# Patient Record
Sex: Female | Born: 1935
Health system: Southern US, Community
[De-identification: ages and names within clinical notes are randomized; demographics above are authoritative.]

## PROBLEM LIST (undated history)

## (undated) DIAGNOSIS — R5381 Other malaise: Secondary | ICD-10-CM

## (undated) DIAGNOSIS — K449 Diaphragmatic hernia without obstruction or gangrene: Secondary | ICD-10-CM

## (undated) DIAGNOSIS — S0101XA Laceration without foreign body of scalp, initial encounter: Secondary | ICD-10-CM

## (undated) DIAGNOSIS — D649 Anemia, unspecified: Secondary | ICD-10-CM

## (undated) DIAGNOSIS — H9190 Unspecified hearing loss, unspecified ear: Secondary | ICD-10-CM

## (undated) DIAGNOSIS — Z8709 Personal history of other diseases of the respiratory system: Secondary | ICD-10-CM

## (undated) DIAGNOSIS — I672 Cerebral atherosclerosis: Secondary | ICD-10-CM

## (undated) DIAGNOSIS — I779 Disorder of arteries and arterioles, unspecified: Secondary | ICD-10-CM

## (undated) DIAGNOSIS — G4700 Insomnia, unspecified: Secondary | ICD-10-CM

## (undated) DIAGNOSIS — F419 Anxiety disorder, unspecified: Secondary | ICD-10-CM

## (undated) DIAGNOSIS — M549 Dorsalgia, unspecified: Secondary | ICD-10-CM

## (undated) DIAGNOSIS — Z9189 Other specified personal risk factors, not elsewhere classified: Secondary | ICD-10-CM

## (undated) DIAGNOSIS — E785 Hyperlipidemia, unspecified: Secondary | ICD-10-CM

## (undated) DIAGNOSIS — Z8781 Personal history of (healed) traumatic fracture: Secondary | ICD-10-CM

## (undated) DIAGNOSIS — K130 Diseases of lips: Secondary | ICD-10-CM

## (undated) DIAGNOSIS — H269 Unspecified cataract: Secondary | ICD-10-CM

## (undated) DIAGNOSIS — H3530 Unspecified macular degeneration: Secondary | ICD-10-CM

## (undated) DIAGNOSIS — M810 Age-related osteoporosis without current pathological fracture: Secondary | ICD-10-CM

## (undated) DIAGNOSIS — F329 Major depressive disorder, single episode, unspecified: Secondary | ICD-10-CM

## (undated) DIAGNOSIS — R5383 Other fatigue: Secondary | ICD-10-CM

## (undated) DIAGNOSIS — E079 Disorder of thyroid, unspecified: Secondary | ICD-10-CM

## (undated) DIAGNOSIS — F32A Depression, unspecified: Secondary | ICD-10-CM

## (undated) DIAGNOSIS — M1990 Unspecified osteoarthritis, unspecified site: Secondary | ICD-10-CM

## (undated) DIAGNOSIS — Z8719 Personal history of other diseases of the digestive system: Secondary | ICD-10-CM

## (undated) DIAGNOSIS — K649 Unspecified hemorrhoids: Secondary | ICD-10-CM

## (undated) DIAGNOSIS — K859 Acute pancreatitis without necrosis or infection, unspecified: Secondary | ICD-10-CM

## (undated) DIAGNOSIS — I10 Essential (primary) hypertension: Secondary | ICD-10-CM

## (undated) DIAGNOSIS — S32591A Other specified fracture of right pubis, initial encounter for closed fracture: Secondary | ICD-10-CM

## (undated) DIAGNOSIS — L719 Rosacea, unspecified: Secondary | ICD-10-CM

## (undated) DIAGNOSIS — Z8679 Personal history of other diseases of the circulatory system: Secondary | ICD-10-CM

## (undated) DIAGNOSIS — R0989 Other specified symptoms and signs involving the circulatory and respiratory systems: Secondary | ICD-10-CM

## (undated) DIAGNOSIS — K8020 Calculus of gallbladder without cholecystitis without obstruction: Secondary | ICD-10-CM

## (undated) DIAGNOSIS — G478 Other sleep disorders: Secondary | ICD-10-CM

## (undated) DIAGNOSIS — R630 Anorexia: Secondary | ICD-10-CM

## (undated) DIAGNOSIS — I498 Other specified cardiac arrhythmias: Secondary | ICD-10-CM

## (undated) DIAGNOSIS — K625 Hemorrhage of anus and rectum: Secondary | ICD-10-CM

## (undated) DIAGNOSIS — N189 Chronic kidney disease, unspecified: Secondary | ICD-10-CM

## (undated) DIAGNOSIS — K5730 Diverticulosis of large intestine without perforation or abscess without bleeding: Secondary | ICD-10-CM

## (undated) HISTORY — DX: Laceration without foreign body of scalp, initial encounter: S01.01XA

## (undated) HISTORY — DX: Personal history of other diseases of the digestive system: Z87.19

## (undated) HISTORY — DX: Diseases of lips: K13.0

## (undated) HISTORY — DX: Personal history of other diseases of the circulatory system: Z86.79

## (undated) HISTORY — DX: Other specified fracture of right pubis, initial encounter for closed fracture: S32.591A

## (undated) HISTORY — DX: Age-related osteoporosis without current pathological fracture: M81.0

## (undated) HISTORY — PX: TOTAL HIP ARTHROPLASTY: SHX124

## (undated) HISTORY — DX: Unspecified macular degeneration: H35.30

## (undated) HISTORY — DX: Disorder of thyroid, unspecified: E07.9

## (undated) HISTORY — DX: Personal history of (healed) traumatic fracture: Z87.81

## (undated) HISTORY — DX: Essential (primary) hypertension: I10

## (undated) HISTORY — DX: Unspecified cataract: H26.9

## (undated) HISTORY — DX: Unspecified hearing loss, unspecified ear: H91.90

## (undated) HISTORY — DX: Dorsalgia, unspecified: M54.9

## (undated) HISTORY — DX: Rosacea, unspecified: L71.9

## (undated) HISTORY — DX: Hemorrhage of anus and rectum: K62.5

## (undated) HISTORY — DX: Unspecified osteoarthritis, unspecified site: M19.90

## (undated) HISTORY — DX: Cerebral atherosclerosis: I67.2

## (undated) HISTORY — DX: Acute pancreatitis without necrosis or infection, unspecified: K85.90

## (undated) HISTORY — DX: Other fatigue: R53.83

## (undated) HISTORY — DX: Hyperlipidemia, unspecified: E78.5

## (undated) HISTORY — DX: Unspecified hemorrhoids: K64.9

## (undated) HISTORY — DX: Diaphragmatic hernia without obstruction or gangrene: K44.9

## (undated) HISTORY — DX: Calculus of gallbladder without cholecystitis without obstruction: K80.20

## (undated) HISTORY — DX: Other malaise: R53.81

## (undated) HISTORY — DX: Other sleep disorders: G47.8

## (undated) HISTORY — DX: Chronic kidney disease, unspecified: N18.9

## (undated) HISTORY — DX: Anorexia: R63.0

## (undated) HISTORY — DX: Anemia, unspecified: D64.9

## (undated) HISTORY — DX: Disorder of arteries and arterioles, unspecified: I77.9

## (undated) HISTORY — DX: Other specified symptoms and signs involving the circulatory and respiratory systems: R09.89

## (undated) HISTORY — DX: Other specified cardiac arrhythmias: I49.8

## (undated) HISTORY — DX: Insomnia, unspecified: G47.00

## (undated) HISTORY — DX: Depression, unspecified: F32.A

## (undated) HISTORY — DX: Other specified personal risk factors, not elsewhere classified: Z91.89

## (undated) HISTORY — DX: Major depressive disorder, single episode, unspecified: F32.9

## (undated) HISTORY — DX: Anxiety disorder, unspecified: F41.9

## (undated) HISTORY — DX: Diverticulosis of large intestine without perforation or abscess without bleeding: K57.30

## (undated) HISTORY — DX: Personal history of other diseases of the respiratory system: Z87.09

## (undated) HISTORY — PX: ABDOMINAL HYSTERECTOMY: SHX81

---

## 1994-02-22 HISTORY — PX: JOINT REPLACEMENT: SHX530

## 2000-04-07 ENCOUNTER — Encounter: Payer: Self-pay | Admitting: Internal Medicine

## 2000-04-07 ENCOUNTER — Encounter: Admission: RE | Admit: 2000-04-07 | Discharge: 2000-04-07 | Payer: Self-pay | Admitting: Internal Medicine

## 2000-05-12 ENCOUNTER — Ambulatory Visit (HOSPITAL_COMMUNITY): Admission: RE | Admit: 2000-05-12 | Discharge: 2000-05-12 | Payer: Self-pay | Admitting: Pulmonary Disease

## 2000-05-12 ENCOUNTER — Encounter: Payer: Self-pay | Admitting: Pulmonary Disease

## 2001-05-10 ENCOUNTER — Ambulatory Visit (HOSPITAL_COMMUNITY): Admission: RE | Admit: 2001-05-10 | Discharge: 2001-05-10 | Payer: Self-pay | Admitting: Specialist

## 2001-09-25 ENCOUNTER — Ambulatory Visit (HOSPITAL_COMMUNITY): Admission: RE | Admit: 2001-09-25 | Discharge: 2001-09-25 | Payer: Self-pay | Admitting: Specialist

## 2002-01-11 ENCOUNTER — Ambulatory Visit (HOSPITAL_COMMUNITY): Admission: RE | Admit: 2002-01-11 | Discharge: 2002-01-11 | Payer: Self-pay | Admitting: *Deleted

## 2002-01-11 ENCOUNTER — Encounter (INDEPENDENT_AMBULATORY_CARE_PROVIDER_SITE_OTHER): Payer: Self-pay | Admitting: *Deleted

## 2003-12-14 ENCOUNTER — Emergency Department (HOSPITAL_COMMUNITY): Admission: EM | Admit: 2003-12-14 | Discharge: 2003-12-14 | Payer: Self-pay | Admitting: Emergency Medicine

## 2005-06-14 ENCOUNTER — Ambulatory Visit (HOSPITAL_COMMUNITY): Admission: RE | Admit: 2005-06-14 | Discharge: 2005-06-14 | Payer: Self-pay | Admitting: *Deleted

## 2005-09-04 ENCOUNTER — Emergency Department (HOSPITAL_COMMUNITY): Admission: EM | Admit: 2005-09-04 | Discharge: 2005-09-04 | Payer: Self-pay | Admitting: Emergency Medicine

## 2005-12-16 ENCOUNTER — Emergency Department (HOSPITAL_COMMUNITY): Admission: EM | Admit: 2005-12-16 | Discharge: 2005-12-16 | Payer: Self-pay | Admitting: Emergency Medicine

## 2006-03-07 ENCOUNTER — Emergency Department (HOSPITAL_COMMUNITY): Admission: EM | Admit: 2006-03-07 | Discharge: 2006-03-07 | Payer: Self-pay | Admitting: Emergency Medicine

## 2007-05-23 ENCOUNTER — Ambulatory Visit (HOSPITAL_COMMUNITY): Admission: RE | Admit: 2007-05-23 | Discharge: 2007-05-23 | Payer: Self-pay | Admitting: *Deleted

## 2007-05-23 HISTORY — PX: COLONOSCOPY: SHX174

## 2007-09-19 ENCOUNTER — Emergency Department (HOSPITAL_COMMUNITY): Admission: EM | Admit: 2007-09-19 | Discharge: 2007-09-19 | Payer: Self-pay | Admitting: Emergency Medicine

## 2008-07-24 ENCOUNTER — Encounter: Admission: RE | Admit: 2008-07-24 | Discharge: 2008-07-24 | Payer: Self-pay | Admitting: Internal Medicine

## 2009-02-19 ENCOUNTER — Inpatient Hospital Stay (HOSPITAL_COMMUNITY): Admission: EM | Admit: 2009-02-19 | Discharge: 2009-02-25 | Payer: Self-pay | Admitting: Emergency Medicine

## 2009-02-20 ENCOUNTER — Encounter (INDEPENDENT_AMBULATORY_CARE_PROVIDER_SITE_OTHER): Payer: Self-pay | Admitting: Internal Medicine

## 2009-02-21 ENCOUNTER — Ambulatory Visit: Payer: Self-pay | Admitting: Vascular Surgery

## 2009-08-25 ENCOUNTER — Emergency Department (HOSPITAL_COMMUNITY): Admission: EM | Admit: 2009-08-25 | Discharge: 2009-08-25 | Payer: Self-pay | Admitting: Emergency Medicine

## 2009-09-15 ENCOUNTER — Ambulatory Visit (HOSPITAL_COMMUNITY): Admission: RE | Admit: 2009-09-15 | Discharge: 2009-09-15 | Payer: Self-pay | Admitting: Orthopedic Surgery

## 2010-05-09 LAB — BASIC METABOLIC PANEL
CO2: 22 mEq/L (ref 19–32)
CO2: 23 mEq/L (ref 19–32)
Calcium: 7.9 mg/dL — ABNORMAL LOW (ref 8.4–10.5)
Chloride: 107 mEq/L (ref 96–112)
Creatinine, Ser: 1.18 mg/dL (ref 0.4–1.2)
GFR calc Af Amer: 52 mL/min — ABNORMAL LOW (ref >60)
GFR calc non Af Amer: 43 mL/min — ABNORMAL LOW (ref >60)
GFR calc non Af Amer: 51 mL/min — ABNORMAL LOW (ref >60)
Sodium: 130 mEq/L — ABNORMAL LOW (ref 135–145)
Sodium: 139 mEq/L (ref 135–145)

## 2010-05-09 LAB — CBC
HCT: 25.2 % — ABNORMAL LOW (ref 36.0–46.0)
HCT: 31.1 % — ABNORMAL LOW (ref 36.0–46.0)
HCT: 35.7 % — ABNORMAL LOW (ref 36.0–46.0)
Hemoglobin: 10.7 g/dL — ABNORMAL LOW (ref 12.0–15.0)
Hemoglobin: 12 g/dL (ref 12.0–15.0)
Hemoglobin: 8.5 g/dL — ABNORMAL LOW (ref 12.0–15.0)
MCV: 91.7 fL (ref 78.0–100.0)
Platelets: 118 10*3/uL — ABNORMAL LOW (ref 150–400)
Platelets: 165 10*3/uL (ref 150–400)
RBC: 3.87 MIL/uL (ref 3.87–5.11)
RDW: 14.8 % (ref 11.5–15.5)
WBC: 7.6 10*3/uL (ref 4.0–10.5)

## 2010-05-09 LAB — PREPARE RBC (CROSSMATCH)

## 2010-05-25 LAB — CBC
Hemoglobin: 10.2 g/dL — ABNORMAL LOW (ref 12.0–15.0)
Hemoglobin: 11.1 g/dL — ABNORMAL LOW (ref 12.0–15.0)
Hemoglobin: 11.5 g/dL — ABNORMAL LOW (ref 12.0–15.0)
MCHC: 34.2 g/dL (ref 30.0–36.0)
Platelets: 140 10*3/uL — ABNORMAL LOW (ref 150–400)
RBC: 3.48 MIL/uL — ABNORMAL LOW (ref 3.87–5.11)
RDW: 13.1 % (ref 11.5–15.5)
RDW: 13.8 % (ref 11.5–15.5)
WBC: 8.6 10*3/uL (ref 4.0–10.5)

## 2010-05-25 LAB — POCT I-STAT, CHEM 8
BUN: 26 mg/dL — ABNORMAL HIGH (ref 6–23)
HCT: 35 % — ABNORMAL LOW (ref 36.0–46.0)
Hemoglobin: 11.9 g/dL — ABNORMAL LOW (ref 12.0–15.0)
Sodium: 140 mEq/L (ref 135–145)
TCO2: 21 mmol/L (ref 0–100)

## 2010-05-25 LAB — DIFFERENTIAL
Basophils Absolute: 0 10*3/uL (ref 0.0–0.1)
Lymphocytes Relative: 10 % — ABNORMAL LOW (ref 12–46)
Lymphocytes Relative: 4 % — ABNORMAL LOW (ref 12–46)
Monocytes Absolute: 0.5 10*3/uL (ref 0.1–1.0)
Monocytes Absolute: 0.7 10*3/uL (ref 0.1–1.0)
Monocytes Relative: 5 % (ref 3–12)
Neutro Abs: 7.3 10*3/uL (ref 1.7–7.7)
Neutro Abs: 7.8 10*3/uL — ABNORMAL HIGH (ref 1.7–7.7)

## 2010-05-25 LAB — APTT: aPTT: 27 seconds (ref 24–37)

## 2010-05-25 LAB — BASIC METABOLIC PANEL
Calcium: 7.9 mg/dL — ABNORMAL LOW (ref 8.4–10.5)
Calcium: 8.7 mg/dL (ref 8.4–10.5)
Creatinine, Ser: 1.23 mg/dL — ABNORMAL HIGH (ref 0.4–1.2)
GFR calc Af Amer: >60 mL/min (ref >60)
GFR calc non Af Amer: 41 mL/min — ABNORMAL LOW (ref >60)
GFR calc non Af Amer: 59 mL/min — ABNORMAL LOW (ref >60)
Glucose, Bld: 111 mg/dL — ABNORMAL HIGH (ref 70–99)
Sodium: 140 mEq/L (ref 135–145)
Sodium: 140 mEq/L (ref 135–145)

## 2010-05-25 LAB — TYPE AND SCREEN
ABO/RH(D): O POS
Antibody Screen: NEGATIVE

## 2010-05-25 LAB — PROTIME-INR: Prothrombin Time: 13.2 seconds (ref 11.6–15.2)

## 2010-05-25 LAB — PHOSPHORUS: Phosphorus: 3.6 mg/dL (ref 2.3–4.6)

## 2010-07-07 NOTE — Op Note (Signed)
NAME:  Patricia Nelson, SCHWENN NO.:  [id number]   MEDICAL RECORD NO.:  [id number]          PATIENT TYPE:  AMB   LOCATION:  ENDO                         FACILITY:  [facility]   PHYSICIAN:  Georgiana Spinner, M.D.    DATE OF BIRTH:  10/28/1915   DATE OF PROCEDURE:  DATE OF DISCHARGE:                               OPERATIVE REPORT   PROCEDURE:  Flexible sigmoidoscopy.   INDICATIONS:  Rectal bleeding.   ANESTHESIA:  None given.   With the patient in the left lateral decubitus position,  the Pentax  videoscopic pediatric colonoscope was inserted in the rectum and passed  under direct vision to approximately 25 cm from the anal verge at which  point we encountered stool that precluded passage any further. The  endoscope was then withdrawn taking circumferential views of the colonic  mucosa stopping in the rectum which appeared normal on direct and showed  large internal hemorrhoids on retroflexed view. The endoscope was  straightened and withdrawn.  The patient's vital signs and pulse  oximeter remained stable. The patient tolerated the procedure well  without apparent complications.   FINDINGS:  Internal hemorrhoids and one was noted to be prolapsing when  we did the rectal examination.  Otherwise an unremarkable examination of  the perineum and rectum to 25 cm.   PLAN:  Have patient follow-up with me as needed.           ______________________________  Georgiana Spinner, M.D.     GMO/MEDQ  D:  05/23/2007  T:  05/23/2007  Job:  [id number]

## 2010-07-10 NOTE — Op Note (Signed)
   NAME:  Patricia Nelson, Patricia Nelson                      ACCOUNT NO.:  [id number]   MEDICAL RECORD NO.:  [id number]                   PATIENT TYPE:  AMB   LOCATION:  ENDO                                 FACILITY:  [facility]   PHYSICIAN:  Georgiana Spinner, M.D.                 DATE OF BIRTH:  1915-04-11   DATE OF PROCEDURE:  DATE OF DISCHARGE:                                 OPERATIVE REPORT   PROCEDURE:  Colonoscopy.   INDICATIONS FOR PROCEDURE:  1. Colon polyp.  2. Colon cancer screening.   ANESTHESIA:  1. Demerol 40 mg.  2. Versed 4 mg.   PROCEDURE:  With patient mildly sedated in the left lateral decubitus  position and subsequently rolled to her back, the Olympus videoscopic  colonoscope was inserted in the rectum and passed under direct vision to the  cecum identified by ileocecal valve and appendiceal orifice both of which  are photographed.  From this point the colonoscope was slowly withdrawn  taking ________________ entire colonic mucosa.  She had thickening of the  sigmoid colon mucosa with diverticulosis noted in this area and this is all  we noted until we pulled back to the rectum.  At approximately 10 cm from  the anal verge a fairly large villous-appearing polyp was seen, it was semi-  attached by a stalk and it was photographed and removed using snare cautery  technique sending a 20/20 blended current with good hemostasis.  Tissue was  retrieved.  The endoscope was reinserted to this point and slowly withdrawn  taking ___________ views of the remaining colonic mucosa and until we  reached the anal verge and placed the endoscope in retroflexion viewing the  anal canal from above.  The endoscope was straightened and withdrawn.  The  patient's vital signs and pulse oximetry remained stable.  The patient  tolerated the procedure well without apparent complications.   FINDINGS:  1. Diverticulosis in sigmoid colon.  2. Large internal hemorrhoids.  3. Polyp at approximately 10 cm  from the anal verge removed.   PLAN:  1. Biopsy report.  2. Patient will call in for results and follow up with me as an outpatient.                                               Georgiana Spinner, M.D.    GMO/MEDQ  D:  01/11/2002  T:  01/11/2002  Job:  [id number]

## 2010-07-10 NOTE — Op Note (Signed)
NAME:  Patricia Nelson, Patricia Nelson NO.:  [id number]   MEDICAL RECORD NO.:  [id number]          PATIENT TYPE:  AMB   LOCATION:  ENDO                         FACILITY:  [facility]   PHYSICIAN:  Georgiana Spinner, M.D.    DATE OF BIRTH:  [birth year]   DATE OF PROCEDURE:  06/14/2005  DATE OF DISCHARGE:                                 OPERATIVE REPORT   PROCEDURE:  Upper endoscopy.   INDICATIONS:  Vomiting blood.   ANESTHESIA:  Demerol 20, Versed 4 mg.   PROCEDURE:  With the patient mildly sedated in the left lateral decubitus  position, the Olympus videoscopic endoscope was inserted in the mouth and  passed under direct vision through the esophagus which appeared normal into  the stomach. Fundus, body, antrum, duodenal bulb, second portion duodenum  appeared normal. From this point, the endoscope was slowly withdrawn, taking  circumferential views of duodenal mucosa until the endoscope had been pulled  back into the stomach and placed in retroflexion viewing the stomach from  below. The endoscope was straightened and withdrawn taking circumferential  views remaining gastric and esophageal mucosa. The patient's vital signs and  pulse oximeter remained stable. The patient tolerated procedure well without  apparent complications.   FINDINGS:  Unremarkable examination.   PLAN:  Have the patient follow-up with me on as-needed basis.           ______________________________  Georgiana Spinner, M.D.     GMO/MEDQ  D:  06/14/2005  T:  06/15/2005  Job:  [id number]

## 2011-09-02 ENCOUNTER — Other Ambulatory Visit: Payer: Self-pay | Admitting: Internal Medicine

## 2011-09-02 DIAGNOSIS — R1310 Dysphagia, unspecified: Secondary | ICD-10-CM

## 2011-09-14 ENCOUNTER — Other Ambulatory Visit: Payer: Self-pay

## 2011-09-17 ENCOUNTER — Other Ambulatory Visit: Payer: Self-pay | Admitting: Gastroenterology

## 2011-09-17 DIAGNOSIS — K859 Acute pancreatitis without necrosis or infection, unspecified: Secondary | ICD-10-CM

## 2011-09-22 ENCOUNTER — Ambulatory Visit
Admission: RE | Admit: 2011-09-22 | Discharge: 2011-09-22 | Disposition: A | Discharge status: Home / Self Care | Payer: Medicare Other | Source: Ambulatory Visit | Attending: Gastroenterology | Admitting: Gastroenterology

## 2011-09-22 ENCOUNTER — Ambulatory Visit
Admission: RE | Admit: 2011-09-22 | Discharge: 2011-09-22 | Disposition: A | Discharge status: Home / Self Care | Payer: Medicare Other | Source: Ambulatory Visit | Attending: Internal Medicine | Admitting: Internal Medicine

## 2011-09-22 DIAGNOSIS — K859 Acute pancreatitis without necrosis or infection, unspecified: Secondary | ICD-10-CM

## 2011-09-22 DIAGNOSIS — R1310 Dysphagia, unspecified: Secondary | ICD-10-CM

## 2012-02-05 ENCOUNTER — Ambulatory Visit (INDEPENDENT_AMBULATORY_CARE_PROVIDER_SITE_OTHER): Payer: Medicare Other | Admitting: Family Medicine

## 2012-02-05 VITALS — BP 164/67 | HR 71 | Temp 97.5°F | Resp 16 | Ht 63.25 in | Wt 125.2 lb

## 2012-02-05 DIAGNOSIS — R4182 Altered mental status, unspecified: Secondary | ICD-10-CM

## 2012-02-05 DIAGNOSIS — R8281 Pyuria: Secondary | ICD-10-CM

## 2012-02-05 DIAGNOSIS — I10 Essential (primary) hypertension: Secondary | ICD-10-CM

## 2012-02-05 DIAGNOSIS — R443 Hallucinations, unspecified: Secondary | ICD-10-CM

## 2012-02-05 DIAGNOSIS — G4700 Insomnia, unspecified: Secondary | ICD-10-CM

## 2012-02-05 LAB — POCT CBC
Lymph, poc: 1.7 (ref 0.6–3.4)
MCH, POC: 28.8 pg (ref 27–31.2)
MCHC: 30 g/dL — AB (ref 31.8–35.4)
MCV: 95.9 fL (ref 80–97)
MID (cbc): 0.5 (ref 0–0.9)
MPV: 9.4 fL (ref 0–99.8)
POC LYMPH PERCENT: 25.8 %L (ref 10–50)
POC MID %: 7.3 %M (ref 0–12)
Platelet Count, POC: 253 10*3/uL (ref 142–424)
RBC: 4.1 M/uL (ref 4.04–5.48)
WBC: 6.7 10*3/uL (ref 4.6–10.2)

## 2012-02-05 LAB — VITAMIN B12: Vitamin B-12: 578 pg/mL (ref 211–911)

## 2012-02-05 LAB — POCT URINALYSIS DIPSTICK
Bilirubin, UA: NEGATIVE
Glucose, UA: NEGATIVE
Ketones, UA: NEGATIVE
Spec Grav, UA: 1.015

## 2012-02-05 LAB — POCT UA - MICROSCOPIC ONLY: Crystals, Ur, HPF, POC: NEGATIVE

## 2012-02-05 LAB — BASIC METABOLIC PANEL
BUN: 43 mg/dL — ABNORMAL HIGH (ref 6–23)
Creat: 1.23 mg/dL — ABNORMAL HIGH (ref 0.50–1.10)
Glucose, Bld: 96 mg/dL (ref 70–99)
Potassium: 5.3 mEq/L (ref 3.5–5.3)

## 2012-02-05 MED ORDER — CIPROFLOXACIN HCL 250 MG PO TABS: 250.0000 mg | ORAL_TABLET | Freq: Two times a day (BID) | ORAL | Status: DC

## 2012-02-05 NOTE — Progress Notes (Signed)
Subjective:    Patient ID: Patricia Nelson, female    DOB: Jan 28, 1916, 78 y.o.   MRN: [id number]  HPI Patricia Nelson is a 78 y.o. female usual patient of Dr. Renne Crigler.   More paranoid since yesterday - called on call doctor last night - instructed to go to ER or Urgent care.,  Per patient - just didn't feel the same.   Doesn't sleep well at times - then not herself without sleep - started on Remeron 1/2 tablet at night as of about a month and a half ago - too sedated all the time - better with 1/4 dose - now up to 1/2 pill. Has forgotten doses of this at times.  More forgetful recently, tells family that someone is coming into apartment to move things, and feels like son in law was doing this,  More hyper last night.  Looking for something missing last night.  She didn't want to tell me today what she was looking for.   Has cut back on taking Remeron. Didn't sleep much last night. Did not take Remeron last night, unsure if took night before. remeron not in pill box at night.   Low back sore this morning - felt twisted when woke up. Has been sore in this area in past, usually twists to move or change of position.  Feels better now. Hx hip surgery, has had worse pain in area this am - was able to change position to improve.   [facility].   Here with daughter today. Still upset about not living at home.   Past Medical History  Diagnosis Date  . Arthritis   . Depression   . Cataract   . Thyroid disease   . Anxiety   . Osteoporosis   . Hypertension    Past Surgical History  Procedure Date  . Abdominal hysterectomy   . Joint replacement    History   Social History  . Marital Status: Widowed    Spouse Name: N/A    Number of Children: N/A  . Years of Education: N/A   Occupational History  . Not on file.   Social History Main Topics  . Smoking status: Never Smoker   . Smokeless tobacco: Not on file  . Alcohol Use: No  . Drug Use: No  . Sexually  Active: No   Other Topics Concern  . Not on file   Social History Narrative  . No narrative on file    Review of Systems  Constitutional: Negative for fever and chills.  Respiratory: Negative for chest tightness and shortness of breath.   Cardiovascular: Negative for chest pain.  Gastrointestinal: Negative for abdominal pain.  Genitourinary: Negative for dysuria, urgency, hematuria and difficulty urinating.  Musculoskeletal: Positive for back pain.      Objective:   Physical Exam  Vitals reviewed. Constitutional: She is oriented to person, place, and time. She appears well-developed and well-nourished.  HENT:  Head: Normocephalic and atraumatic.  Right Ear: External ear normal.  Left Ear: External ear normal.  Mouth/Throat: Oropharynx is clear and moist.  Eyes: EOM are normal. Pupils are equal, round, and reactive to light.  Neck: Normal range of motion. No thyromegaly present.  Cardiovascular: Normal rate, regular rhythm, normal heart sounds and intact distal pulses.   Pulmonary/Chest: Effort normal and breath sounds normal.  Abdominal: Soft. Bowel sounds are normal. She exhibits no distension. There is no hepatosplenomegaly. There is no tenderness. There is no rebound and  no CVA tenderness.  Musculoskeletal:       Left hip: She exhibits no tenderness and no bony tenderness (no pain with IR/ER of hip.).  Lymphadenopathy:    She has no cervical adenopathy.  Neurological: She is alert and oriented to person, place, and time. She has normal strength. No sensory deficit. Coordination normal.       Oriented to person place, time, month, not date.  No focal weakness appreciated.   Skin: Skin is warm. No rash noted.     Psychiatric: Her speech is normal and behavior is normal.       Appears irritated/upset in speech only at times, but good eye contact and appropriate in responses otherwise.    Results for orders placed in visit on 02/05/12  POCT UA - MICROSCOPIC ONLY       Component Value Range   WBC, Ur, HPF, POC 3-4     RBC, urine, microscopic 0-1     Bacteria, U Microscopic trace     Mucus, UA pos     Epithelial cells, urine per micros neg     Crystals, Ur, HPF, POC neg     Casts, Ur, LPF, POC neg     Yeast, UA neg    POCT URINALYSIS DIPSTICK      Component Value Range   Color, UA yellow     Clarity, UA clear     Glucose, UA neg     Bilirubin, UA neg     Ketones, UA neg     Spec Grav, UA 1.015     Blood, UA neg     pH, UA 6.5     Protein, UA neg     Urobilinogen, UA 0.2     Nitrite, UA neg     Leukocytes, UA Trace    POCT CBC      Component Value Range   WBC 6.7  4.6 - 10.2 K/uL   Lymph, poc 1.7  0.6 - 3.4   POC LYMPH PERCENT 25.8  10 - 50 %L   MID (cbc) 0.5  0 - 0.9   POC MID % 7.3  0 - 12 %M   POC Granulocyte 4.5  2 - 6.9   Granulocyte percent 66.9  37 - 80 %G   RBC 4.10  4.04 - 5.48 M/uL   Hemoglobin 11.8 (*) 12.2 - 16.2 g/dL   HCT, POC 08.6  57.8 - 47.9 %   MCV 95.9  80 - 97 fL   MCH, POC 28.8  27 - 31.2 pg   MCHC 30.0 (*) 31.8 - 35.4 g/dL   RDW, POC 46.9     Platelet Count, POC 253  142 - 424 K/uL   MPV 9.4  0 - 99.8 fL       Assessment & Plan:  Patricia Nelson is a 78 y.o. female 1. Hallucination  POCT UA - Microscopic Only, POCT urinalysis dipstick, POCT CBC, TSH, Basic metabolic panel, Vitamin B12  2. Mental status change  POCT UA - Microscopic Only, POCT urinalysis dipstick  3. Insomnia  POCT CBC, TSH, Vitamin B12  4. Hypertension  POCT CBC, TSH, Basic metabolic panel, Vitamin B12   Suspect component of insomnia with increased agitation, paranoia with sleep deprivation. Also likely depression component on history today with some decreased interest in activities and continued resentment for decision to go into [facility] 2 1/2 years ago.  Possible early UTI with pyuria, vs asymptomatic bacteruria.  PCN allergic.  Will start  cipro 250mg  BID x 3 days.  SED and cautioned on mental status change with his med. Will check  urine culture, start   Will check labs above, restart remeron 1/4 tab at bedtime, and follow up with Dr. Renne Crigler in next 3-4 days, sooner or rtc sooner if any new or worsening symptoms. Discussed with daughter in room and patient.   LBP - likely hx DDD with hx in past - follow up with primary MD, sooner if worse.   HTN - elevated today.  Check outside BP's - if remaining over 140/90 - RTC.  Continue Norvasc.    Patient Instructions  Your should receive a call or letter about your lab results within the next week to 10 days.  You possibly have an early bladder infection. Start the new antibiotic, we will check a urine culture to be sure.   Restart the remeron - 1/4 pill at bedtime and follow up with your primary provider on Monday.  You can also follow up on the back pains with your primary care provider.  Return to the clinic or go to the nearest emergency room if any of your symptoms worsen or new symptoms occur.

## 2012-02-05 NOTE — Patient Instructions (Signed)
Your should receive a call or letter about your lab results within the next week to 10 days.  You possibly have an early bladder infection. Start the new antibiotic, we will check a urine culture to be sure.   Restart the remeron - 1/4 pill at bedtime and follow up with your primary provider on Monday.  You can also follow up on the back pains with your primary care provider.  Return to the clinic or go to the nearest emergency room if any of your symptoms worsen or new symptoms occur.

## 2012-02-06 LAB — URINE CULTURE: Colony Count: NO GROWTH

## 2012-10-12 ENCOUNTER — Encounter: Payer: Self-pay | Admitting: *Deleted

## 2012-10-18 ENCOUNTER — Encounter: Payer: Self-pay | Admitting: Cardiovascular Disease

## 2012-10-18 ENCOUNTER — Ambulatory Visit (INDEPENDENT_AMBULATORY_CARE_PROVIDER_SITE_OTHER): Payer: Medicare Other | Admitting: Cardiovascular Disease

## 2012-10-18 VITALS — BP 114/58 | HR 65 | Ht 62.5 in | Wt 118.8 lb

## 2012-10-18 DIAGNOSIS — I10 Essential (primary) hypertension: Secondary | ICD-10-CM

## 2012-10-18 DIAGNOSIS — R55 Syncope and collapse: Secondary | ICD-10-CM | POA: Insufficient documentation

## 2012-10-18 DIAGNOSIS — I6523 Occlusion and stenosis of bilateral carotid arteries: Secondary | ICD-10-CM

## 2012-10-18 DIAGNOSIS — I658 Occlusion and stenosis of other precerebral arteries: Secondary | ICD-10-CM

## 2012-10-18 DIAGNOSIS — Z8719 Personal history of other diseases of the digestive system: Secondary | ICD-10-CM

## 2012-10-18 DIAGNOSIS — I6529 Occlusion and stenosis of unspecified carotid artery: Secondary | ICD-10-CM

## 2012-10-18 DIAGNOSIS — E7800 Pure hypercholesterolemia, unspecified: Secondary | ICD-10-CM

## 2012-10-18 NOTE — Progress Notes (Signed)
Patient ID: Patricia Nelson, female   DOB: [birth year], 79 y.o.   MRN: [id number]     Reason for office visit Syncope, hypertension, carotid artery disease followup  Patricia Nelson has recently turned 79 years old. She continues to live in assisted living and is quite active. She walks on a almost daily basis. She does complain of dizziness when she stands up suddenly. She has not had any further syncopal events. Dr. Renne Crigler has noticed that her blood pressure tends to be rather low and has recommended more frequent blood pressure checks. She has not had any focal neurological complaints. Denies edema. Chest not had any chest pain or shortness of breath or palpitations.    Allergies  Allergen Reactions  . Penicillins Rash    Current Outpatient Prescriptions  Medication Sig Dispense Refill  . amLODipine (NORVASC) 10 MG tablet Take 10 mg by mouth daily.      Marland Kitchen aspirin 81 MG tablet Take 81 mg by mouth daily.      . cholecalciferol (VITAMIN D) 1000 UNITS tablet Take 1,000 Units by mouth daily.      Marland Kitchen levothyroxine (SYNTHROID, LEVOTHROID) 25 MCG tablet Take 25 mcg by mouth daily.      . rosuvastatin (CRESTOR) 5 MG tablet Take 5 mg by mouth daily.      . sertraline (ZOLOFT) 50 MG tablet Take 50 mg by mouth daily.      . valsartan-hydrochlorothiazide (DIOVAN-HCT) 320-25 MG per tablet Take 320 tablets by mouth.       No current facility-administered medications for this visit.    Past Medical History  Diagnosis Date  . Arthritis   . Depression   . Cataract   . Thyroid disease   . Anxiety   . Osteoporosis   . Hypertension   . Atherosclerotic cerebrovascular disease   . Hyperlipidemia   . Pancreatitis     Past Surgical History  Procedure Laterality Date  . Abdominal hysterectomy    . Joint replacement      Family History  Problem Relation Age of Onset  . Heart disease Mother   . Diabetes Mother   . Heart disease Father   . Diabetes Sister   . Diabetes Brother   . Diabetes  Daughter     History   Social History  . Marital Status: Widowed    Spouse Name: N/A    Number of Children: N/A  . Years of Education: N/A   Occupational History  . Not on file.   Social History Main Topics  . Smoking status: Never Smoker   . Smokeless tobacco: Not on file  . Alcohol Use: No  . Drug Use: No  . Sexual Activity: No   Other Topics Concern  . Not on file   Social History Narrative  . No narrative on file    Review of systems: The patient specifically denies any chest pain at rest or with exertion, dyspnea at rest or with exertion, orthopnea, paroxysmal nocturnal dyspnea, syncope, palpitations, focal neurological deficits, intermittent claudication, lower extremity edema, unexplained weight gain, cough, hemoptysis or wheezing.  The patient also denies abdominal pain, nausea, vomiting, dysphagia, diarrhea, constipation, polyuria, polydipsia, dysuria, hematuria, frequency, urgency, abnormal bleeding or bruising, fever, chills, unexpected weight changes, mood swings, change in skin or hair texture, change in voice quality, auditory or visual problems, allergic reactions or rashes, new musculoskeletal complaints other than usual "aches and pains".   PHYSICAL EXAM BP 114/58  Pulse 65  Ht 5' 2.5" (1.588 m)  Wt 118 lb 12.8 oz (53.887 kg)  BMI 21.37 kg/m2  General: Alert, oriented x3, no distress Head: no evidence of trauma, PERRL, EOMI, no exophtalmos or lid lag, no myxedema, no xanthelasma; normal ears, nose and oropharynx Neck: normal jugular venous pulsations and no hepatojugular reflux; brisk carotid pulses without delay , but there are bilateral carotid bruits Chest: clear to auscultation, no signs of consolidation by percussion or palpation, normal fremitus, symmetrical and full respiratory excursions Cardiovascular: normal position and quality of the apical impulse, regular rhythm, normal first and second heart sounds, no murmurs, rubs or gallops Abdomen: no  tenderness or distention, no masses by palpation, no abnormal pulsatility or arterial bruits, normal bowel sounds, no hepatosplenomegaly Extremities: no clubbing, cyanosis or edema; 2+ radial, ulnar and brachial pulses bilaterally; 2+ right femoral, posterior tibial and dorsalis pedis pulses; 2+ left femoral, posterior tibial and dorsalis pedis pulses; no subclavian or femoral bruits Neurological: grossly nonfocal   EKG: Sinus rhythm with a couple of PACs, otherwise normal  BMET    Component Value Date/Time   NA 140 02/05/2012 1216   K 5.3 02/05/2012 1216   CL 108 02/05/2012 1216   CO2 23 02/05/2012 1216   GLUCOSE 96 02/05/2012 1216   BUN 43* 02/05/2012 1216   CREATININE 1.23* 02/05/2012 1216   CREATININE 1.02 02/25/2009 0530   CALCIUM 9.1 02/05/2012 1216   GFRNONAA 51* 02/25/2009 0530   GFRAA  Value: >60        The eGFR has been calculated using the MDRD equation. This calculation has not been validated in all clinical situations. eGFR's persistently <60 mL/min signify possible Chronic Kidney Disease. 02/25/2009 0530     ASSESSMENT AND PLAN HTN (hypertension) Her blood pressure control is excellent today, maybe a little too good for this 79 year old lady. She describes symptoms that are strongly compatible with orthostatic hypotension. It seems that Dr. Renne Crigler has already recommended that her blood pressure be checked with increased frequency and is concerned as well about her blood pressure being too low. I would be in perfect agreement with him if he should choose to reduce the dose of her antihypertensive medications. I would probably reduce the dose of diuretic in her combination Diovan HCT. Note that sertraline may also cause some degree of orthostatic hypotension. Meanwhile I advised Patricia Nelson to avoid jumping up out of bed too quickly, in order to avoid orthostatic hypotension. It is possible that her previous episodes of syncope may have been related to orthostatic hypotension as  well.  Hypercholesteremia On statin therapy - will get her most recent labs.  Syncope Possible orthostatic hypotension.  Because of the abrupt nature of her syncopal events, carotid sinus hypersensitivity is an important differential diagnosis. No recent events. Consider upright tilt table test with carotid sinus compression versus a loop recorder if her symptoms of syncope return.  Carotid atherosclerosis Carotid duplex ultrasonography in 2012 showed bilateral less than 50% carotid stenoses as well as a 50-69% stenosis in the left subclavian artery with antegrade vertebral flow. Serial studies are not recommended in my opinion, unless she should develop neurological symptoms.  H/O acute pancreatitis Etiology uncertain  Orders Placed This Encounter  Procedures  . EKG 12-Lead   Meds ordered this encounter  Medications  . sertraline (ZOLOFT) 50 MG tablet    Sig: Take 50 mg by mouth daily.  . valsartan-hydrochlorothiazide (DIOVAN-HCT) 320-25 MG per tablet    Sig: Take 320 tablets by mouth.    Daly Whipkey  Thurmon Fair, MD, [facility]  [facility] [phone] office [phone] pager

## 2012-10-18 NOTE — Assessment & Plan Note (Signed)
Her blood pressure control is excellent today, maybe a little too good for this 79 year old lady. She describes symptoms that are strongly compatible with orthostatic hypotension. It seems that Dr. Renne Crigler has already recommended that her blood pressure be checked with increased frequency and is concerned as well about her blood pressure being too low. I would be in perfect agreement with him if he should choose to reduce the dose of her antihypertensive medications. I would probably reduce the dose of diuretic in her combination Diovan HCT. Note that sertraline may also cause some degree of orthostatic hypotension. Meanwhile I advised Patricia Nelson to avoid jumping up out of bed too quickly, in order to avoid orthostatic hypotension. It is possible that her previous episodes of syncope may have been related to orthostatic hypotension as well.

## 2012-10-18 NOTE — Assessment & Plan Note (Signed)
Etiology uncertain

## 2012-10-18 NOTE — Assessment & Plan Note (Signed)
Possible orthostatic hypotension.  Because of the abrupt nature of her syncopal events, carotid sinus hypersensitivity is an important differential diagnosis. No recent events. Consider upright tilt table test with carotid sinus compression versus a loop recorder if her symptoms of syncope return.

## 2012-10-18 NOTE — Assessment & Plan Note (Signed)
On statin therapy - will get her most recent labs.

## 2012-10-18 NOTE — Assessment & Plan Note (Signed)
Carotid duplex ultrasonography in 2012 showed bilateral less than 50% carotid stenoses as well as a 50-69% stenosis in the left subclavian artery with antegrade vertebral flow. Serial studies are not recommended in my opinion, unless she should develop neurological symptoms.

## 2012-10-18 NOTE — Patient Instructions (Addendum)
Your physician recommends that you schedule a follow-up appointment in: 12 months  Please use caution when standing up, especially first thing in the morning. Take a minute to make sure you are steady before walking.

## 2012-12-07 ENCOUNTER — Ambulatory Visit: Payer: Medicare Other | Attending: Internal Medicine | Admitting: Occupational Therapy

## 2012-12-07 DIAGNOSIS — IMO0001 Reserved for inherently not codable concepts without codable children: Secondary | ICD-10-CM | POA: Insufficient documentation

## 2012-12-07 DIAGNOSIS — H53419 Scotoma involving central area, unspecified eye: Secondary | ICD-10-CM | POA: Insufficient documentation

## 2013-01-30 ENCOUNTER — Other Ambulatory Visit (HOSPITAL_COMMUNITY): Payer: Self-pay

## 2013-01-31 ENCOUNTER — Encounter (HOSPITAL_COMMUNITY)
Admission: RE | Admit: 2013-01-31 | Discharge: 2013-01-31 | Disposition: A | Discharge status: Home / Self Care | Payer: Medicare Other | Source: Ambulatory Visit | Attending: Internal Medicine | Admitting: Internal Medicine

## 2013-01-31 DIAGNOSIS — M810 Age-related osteoporosis without current pathological fracture: Secondary | ICD-10-CM | POA: Insufficient documentation

## 2013-01-31 MED ORDER — DENOSUMAB 60 MG/ML ~~LOC~~ SOLN
60.0000 mg | Freq: Once | SUBCUTANEOUS | Status: AC
  Administered 2013-01-31: 60 mg via SUBCUTANEOUS
  Filled 2013-01-31: qty 1

## 2013-03-26 DIAGNOSIS — K625 Hemorrhage of anus and rectum: Secondary | ICD-10-CM

## 2013-03-26 DIAGNOSIS — K130 Diseases of lips: Secondary | ICD-10-CM

## 2013-03-26 HISTORY — DX: Hemorrhage of anus and rectum: K62.5

## 2013-03-26 HISTORY — DX: Diseases of lips: K13.0

## 2013-05-25 LAB — TSH: TSH: 1.76 u[IU]/mL (ref 0.41–5.90)

## 2013-05-25 LAB — BASIC METABOLIC PANEL
BUN: 57 mg/dL — AB (ref 4–21)
CREATININE: 1.7 mg/dL — AB (ref 0.5–1.1)
Glucose: 107 mg/dL
Potassium: 4.4 mmol/L (ref 3.4–5.3)
SODIUM: 141 mmol/L (ref 137–147)

## 2013-05-25 LAB — CBC AND DIFFERENTIAL
HCT: 29 % — AB (ref 36–46)
Hemoglobin: 9.9 g/dL — AB (ref 12.0–16.0)
Platelets: 215 10*3/uL (ref 150–399)
WBC: 5.4 10^3/mL

## 2013-06-28 ENCOUNTER — Non-Acute Institutional Stay: Payer: Medicare Other | Admitting: Nurse Practitioner

## 2013-06-28 ENCOUNTER — Encounter: Payer: Self-pay | Admitting: Nurse Practitioner

## 2013-06-28 VITALS — BP 142/60 | HR 62 | Temp 97.2°F | Ht 62.75 in | Wt 114.0 lb

## 2013-06-28 DIAGNOSIS — D638 Anemia in other chronic diseases classified elsewhere: Secondary | ICD-10-CM

## 2013-06-28 DIAGNOSIS — I471 Supraventricular tachycardia, unspecified: Secondary | ICD-10-CM

## 2013-06-28 DIAGNOSIS — R32 Unspecified urinary incontinence: Secondary | ICD-10-CM | POA: Insufficient documentation

## 2013-06-28 DIAGNOSIS — H3530 Unspecified macular degeneration: Secondary | ICD-10-CM | POA: Insufficient documentation

## 2013-06-28 DIAGNOSIS — I4891 Unspecified atrial fibrillation: Secondary | ICD-10-CM | POA: Insufficient documentation

## 2013-06-28 DIAGNOSIS — K449 Diaphragmatic hernia without obstruction or gangrene: Secondary | ICD-10-CM

## 2013-06-28 DIAGNOSIS — I6529 Occlusion and stenosis of unspecified carotid artery: Secondary | ICD-10-CM

## 2013-06-28 DIAGNOSIS — H9190 Unspecified hearing loss, unspecified ear: Secondary | ICD-10-CM

## 2013-06-28 DIAGNOSIS — F418 Other specified anxiety disorders: Secondary | ICD-10-CM

## 2013-06-28 DIAGNOSIS — Z87898 Personal history of other specified conditions: Secondary | ICD-10-CM

## 2013-06-28 DIAGNOSIS — K5790 Diverticulosis of intestine, part unspecified, without perforation or abscess without bleeding: Secondary | ICD-10-CM | POA: Insufficient documentation

## 2013-06-28 DIAGNOSIS — Z8719 Personal history of other diseases of the digestive system: Secondary | ICD-10-CM

## 2013-06-28 DIAGNOSIS — N179 Acute kidney failure, unspecified: Secondary | ICD-10-CM | POA: Insufficient documentation

## 2013-06-28 DIAGNOSIS — M810 Age-related osteoporosis without current pathological fracture: Secondary | ICD-10-CM

## 2013-06-28 DIAGNOSIS — K5900 Constipation, unspecified: Secondary | ICD-10-CM

## 2013-06-28 DIAGNOSIS — I498 Other specified cardiac arrhythmias: Secondary | ICD-10-CM

## 2013-06-28 DIAGNOSIS — R350 Frequency of micturition: Secondary | ICD-10-CM

## 2013-06-28 DIAGNOSIS — N189 Chronic kidney disease, unspecified: Secondary | ICD-10-CM

## 2013-06-28 DIAGNOSIS — F0390 Unspecified dementia without behavioral disturbance: Secondary | ICD-10-CM

## 2013-06-28 DIAGNOSIS — K5730 Diverticulosis of large intestine without perforation or abscess without bleeding: Secondary | ICD-10-CM

## 2013-06-28 DIAGNOSIS — K649 Unspecified hemorrhoids: Secondary | ICD-10-CM

## 2013-06-28 DIAGNOSIS — E039 Hypothyroidism, unspecified: Secondary | ICD-10-CM | POA: Insufficient documentation

## 2013-06-28 DIAGNOSIS — Z9189 Other specified personal risk factors, not elsewhere classified: Secondary | ICD-10-CM

## 2013-06-28 DIAGNOSIS — G4700 Insomnia, unspecified: Secondary | ICD-10-CM | POA: Insufficient documentation

## 2013-06-28 DIAGNOSIS — I10 Essential (primary) hypertension: Secondary | ICD-10-CM

## 2013-06-28 DIAGNOSIS — F341 Dysthymic disorder: Secondary | ICD-10-CM

## 2013-06-28 HISTORY — DX: Unspecified macular degeneration: H35.30

## 2013-06-28 NOTE — Assessment & Plan Note (Signed)
MMSE 18/30 05/2013 per Dtr. May consider Namenda.

## 2013-06-28 NOTE — Assessment & Plan Note (Signed)
Bun/creat 57/1.71 05/25/13. Will dc Diovan/HCT. Start Bystolic 10mg  daily. Monitor Bp bid.

## 2013-06-28 NOTE — Assessment & Plan Note (Signed)
.  hx

## 2013-06-28 NOTE — Assessment & Plan Note (Signed)
Stable

## 2013-06-28 NOTE — Assessment & Plan Note (Signed)
Hx

## 2013-06-28 NOTE — Assessment & Plan Note (Signed)
Continue Levothyroxine 55mcg daily. TSH 1.76 05/25/13

## 2013-06-28 NOTE — Assessment & Plan Note (Signed)
F/u cardiology 

## 2013-06-28 NOTE — Assessment & Plan Note (Signed)
Last colonoscopy 05/23/07

## 2013-06-28 NOTE — Progress Notes (Signed)
Patient ID: Patricia Nelson, female   DOB: Nov 15, 1915, 80 y.o.   MRN: [id number]   Code Status: DNR. POA  Allergies  Allergen Reactions  . Ace Inhibitors     cough  . Mirtazapine     Nightmares   . Penicillins Rash    Chief Complaint  Patient presents with  . Medical Management of Chronic Issues    New Patient, switching doctors,     HPI: Patient is a 80 y.o. female seen in the clinic at [facility] today for  and other chronic medical conditions.  Problem List Items Addressed This Visit   Anemia of chronic disease - Primary     Hgb 9.9, MCV and MCH wnl 88/30.4 05/25/13. Fecal occult blood negative 06/28/13. Will repeat CBC and obtain anemia panel. Her baseline Hgb is 10s.     Carotid atherosclerosis     F/u cardiology.     Chronic renal disease     Bun/creat 57/1.71 05/25/13. Will dc Diovan/HCT. Start Bystolic 10mg  daily. Monitor Bp bid.     Depression with anxiety     Crys at night, cannot return to sleep after bathroom trips(2-3x/night), stated she hates this place([facility]). She believes her son in law takes advantage of her money. Failed Zoloft, Mirtazapine, and Trazodone. Will change Trazodone to qod for 1 week, then dc. Try Lexapro to stabilize her mood.     Diverticulosis     Last colonoscopy 05/23/07    Hemorrhoids     2 small external hemorrhoids at 11am.     Hiatal hernia     Stable.     HOH (hard of hearing)     Speaker has to adjust tone of voice to be heard.     HTN (hypertension)     Continue Novasc 10mg . Dc Diovan/HCT due to elevated Bun/creat. Start Bystolic 10mg  daily for Bp. Monitor Bp bid.     Hx of pancreatitis     Hx     Hx of syncope     Hx     Insomnia     Early am awake and difficulty return to sleep at after bathroom trips. Trazodone is not effective. Will taper it off. Try Lexapro to stabilize mood.     Macular degeneration     Difficulty with reading.     Osteoporosis, unspecified     POA the patient's dtr: Reclast q39ms.    Relevant Medications      Calcium Carbonate-Vitamin D (CALCIUM 600+D) 600-400 MG-UNIT per tablet   Senile dementia     MMSE 18/30 05/2013 per Dtr. May consider Namenda.     Relevant Medications      mirtazapine (REMERON) 30 MG tablet      traZODone (DESYREL) 50 MG tablet   SVT (supraventricular tachycardia)     hx    Unspecified constipation     Stable, taking MiraLax daily.     Unspecified hypothyroidism     Continue Levothyroxine 72mcg daily. TSH 1.76 05/25/13    Urinary frequency     Chronic. Has been evaluated by Dr. Amalia Hailey.        Review of Systems:  Review of Systems  Constitutional: Positive for weight loss. Negative for fever, chills, malaise/fatigue and diaphoresis.       #4Ibs in the past 6 months  HENT: Positive for hearing loss. Negative for congestion, ear discharge, ear pain, nosebleeds, sore throat and tinnitus.   Eyes: Negative for blurred vision, double vision, photophobia, pain, discharge and redness.  Respiratory: Negative for cough, hemoptysis, sputum production, shortness of breath, wheezing and stridor.   Cardiovascular: Negative for chest pain, palpitations, orthopnea, claudication, leg swelling and PND.  Gastrointestinal: Negative for heartburn, nausea, vomiting, abdominal pain, diarrhea, constipation, blood in stool and melena.  Genitourinary: Positive for frequency. Negative for dysuria, urgency, hematuria and flank pain.  Musculoskeletal: Negative for back pain, falls, joint pain, myalgias and neck pain.       Ambulates with walker.   Skin: Negative for itching and rash.  Neurological: Negative for dizziness, tingling, tremors, sensory change, speech change, focal weakness, seizures, loss of consciousness, weakness and headaches.  Endo/Heme/Allergies: Negative for environmental allergies and polydipsia. Does not bruise/bleed easily.  Psychiatric/Behavioral: Positive for depression and memory loss. Negative for suicidal ideas, hallucinations and substance  abuse. The patient is nervous/anxious and has insomnia.      Past Medical History  Diagnosis Date  . Arthritis   . Depression   . Cataract   . Thyroid disease   . Anxiety   . Osteoporosis   . Hypertension   . Atherosclerotic cerebrovascular disease   . Hyperlipidemia   . Pancreatitis   . Other sleep disturbances   . Backache, unspecified   . Hemorrhage of rectum and anus 03/26/2013  . Diseases of lips 03/26/2013  . Senile osteoporosis     with old T11 fracture  . Chronic kidney disease, unspecified   . Other malaise and fatigue   . Anorexia   . Insomnia, unspecified   . Other symptoms involving cardiovascular system   . Other specified cardiac dysrhythmias(427.89)   . Diaphragmatic hernia without mention of obstruction or gangrene   . Macular degeneration (senile) of retina, unspecified   . Personal history of other diseases of digestive system     pancreatitis from gallstones  . Cholelithiasis   . Unspecified disorders of arteries and arterioles   . Other specified personal history presenting hazards to health(V15.89)   . Personal history of other diseases of circulatory system   . Unspecified hemorrhoids without mention of complication   . Unspecified hearing loss   . Anemia, unspecified   . Rosacea   . Personal history of traumatic fracture   . Personal history of other diseases of respiratory system   . Diverticulosis of colon (without mention of hemorrhage)    Past Surgical History  Procedure Laterality Date  . Abdominal hysterectomy      and BSO fibroids  . Joint replacement  1996  . Colonoscopy  05/23/2007  . Total hip arthroplasty Right    Social History:   reports that she has never smoked. She does not have any smokeless tobacco history on file. She reports that she does not drink alcohol or use illicit drugs.  Family History  Problem Relation Age of Onset  . Heart disease Mother   . Diabetes Mother   . Heart disease Father   . Diabetes Sister   .  Diabetes Brother   . Diabetes Daughter     Medications: Patient's Medications  New Prescriptions   No medications on file  Previous Medications   ACETAMINOPHEN (TYLENOL) 500 MG TABLET    Take 500 mg by mouth. Take one twice daily for pain   AMLODIPINE (NORVASC) 10 MG TABLET    Take 10 mg by mouth daily.   ASCORBIC ACID (VITAMIN C) 1000 MG TABLET    Take 1,000 mg by mouth daily. Take one daily   ASPIRIN 81 MG TABLET    Take 81 mg by  mouth daily.   CALCIUM CARBONATE-VITAMIN D (CALCIUM 600+D) 600-400 MG-UNIT PER TABLET    Take 1 tablet by mouth daily.   CHOLECALCIFEROL (VITAMIN D) 1000 UNITS TABLET    Take 1,000 Units by mouth daily.   CLINDAMYCIN (CLEOCIN) 150 MG CAPSULE    Take 150 mg by mouth. 4 caps one hour prior dental   COENZYME Q10 (CO Q-10) 200 MG CAPS    Take by mouth. Take one daily   GUAIFENESIN (HUMIBID E) 400 MG TABS TABLET    Take 400 mg by mouth. Every 4 hours as needed cough   IBUPROFEN (ADVIL,MOTRIN) 400 MG TABLET    Take 400 mg by mouth every 4 (four) hours as needed.   LEVOTHYROXINE (SYNTHROID, LEVOTHROID) 25 MCG TABLET    Take 25 mcg by mouth daily.   MIRTAZAPINE (REMERON) 30 MG TABLET    One at bedtime   MULTIPLE VITAMINS-MINERALS (ICAPS PO)    Take by mouth. Take one daily for eyes   OMEGA-3 FATTY ACIDS (FISH OIL) 1000 MG CAPS    Take by mouth. Take one tablet daily   POLYETHYLENE GLYCOL (MIRALAX / GLYCOLAX) PACKET    Take 17 g by mouth daily.   RESTASIS 0.05 % OPHTHALMIC EMULSION    One drop both eyes twice daily   ROSUVASTATIN (CRESTOR) 5 MG TABLET    Take 5 mg by mouth daily.   TRAZODONE (DESYREL) 50 MG TABLET    Take 1/2 tablet at bedtime   VALSARTAN-HYDROCHLOROTHIAZIDE (DIOVAN-HCT) 320-25 MG PER TABLET    Take 320 tablets by mouth.  Modified Medications   No medications on file  Discontinued Medications   SERTRALINE (ZOLOFT) 50 MG TABLET    Take 50 mg by mouth daily.     Physical Exam: Physical Exam  Constitutional: She is oriented to person, place, and  time. She appears well-developed and well-nourished. No distress.  HENT:  Head: Normocephalic and atraumatic.  Right Ear: External ear normal.  Left Ear: External ear normal.  Nose: Nose normal.  Mouth/Throat: Oropharynx is clear and moist. No oropharyngeal exudate.  Eyes: Conjunctivae and EOM are normal. Pupils are equal, round, and reactive to light. Right eye exhibits no discharge. Left eye exhibits no discharge. No scleral icterus.  Neck: Normal range of motion. Neck supple. No JVD present. No tracheal deviation present. No thyromegaly present.  Cardiovascular: Normal rate, regular rhythm, normal heart sounds and intact distal pulses.  Exam reveals no gallop and no friction rub.   No murmur heard. Pulmonary/Chest: Effort normal and breath sounds normal. No stridor. No respiratory distress. She has no wheezes. She has no rales. She exhibits no tenderness.  Abdominal: Soft. Bowel sounds are normal. She exhibits no distension and no mass. There is no tenderness. There is no rebound and no guarding.  2 small hemorrhoids at 11 am.   Genitourinary: Guaiac negative stool.  Musculoskeletal: Normal range of motion. She exhibits no edema and no tenderness.  Lymphadenopathy:    She has no cervical adenopathy.  Neurological: She is alert and oriented to person, place, and time. She has normal reflexes. She displays normal reflexes. No cranial nerve deficit. She exhibits abnormal muscle tone. Coordination normal.  Skin: Skin is warm. No rash noted. She is not diaphoretic. No erythema. No pallor.  Psychiatric: Her mood appears anxious. Her affect is angry. Her affect is not blunt, not labile and not inappropriate. Her speech is not delayed, not tangential and not slurred. She is not agitated, not aggressive, not hyperactive, not  slowed, not withdrawn, not actively hallucinating and not combative. Thought content is paranoid. Thought content is not delusional. Cognition and memory are impaired. She does not  express impulsivity or inappropriate judgment. She exhibits a depressed mood. She expresses no homicidal and no suicidal ideation. She expresses no suicidal plans and no homicidal plans. She is communicative. She exhibits abnormal recent memory and abnormal remote memory. She is attentive.    Filed Vitals:   06/28/13 1440  BP: 142/60  Pulse: 62  Temp: 97.2 F (36.2 C)  Height: 5' 2.75" (1.594 m)  Weight: 114 lb (51.71 kg)      Labs reviewed: Basic Metabolic Panel:  Recent Labs  05/25/13  NA 141  K 4.4  BUN 57*  CREATININE 1.7*  TSH 1.76   CBC:  Recent Labs  05/25/13  WBC 5.4  HGB 9.9*  HCT 29*  PLT 215    Past Procedures:  10/24/12 EKG: sinus rhythm with marked sinus arrhythmia.   05/16/13 X-ray thoracic spine: mold compression fractures no evidence of a recent fracture. However, a subtle increase in an old fracture may be inapparent radio graphically. Assessment/Plan Anemia of chronic disease Hgb 9.9, MCV and MCH wnl 88/30.4 05/25/13. Fecal occult blood negative 06/28/13. Will repeat CBC and obtain anemia panel. Her baseline Hgb is 10s.   Chronic renal disease Bun/creat 57/1.71 05/25/13. Will dc Diovan/HCT. Start Bystolic 10mg  daily. Monitor Bp bid.   Unspecified hypothyroidism Continue Levothyroxine 18mcg daily. TSH 1.76 05/25/13  Unspecified constipation Stable, taking MiraLax daily.   HTN (hypertension) Continue Novasc 10mg . Dc Diovan/HCT due to elevated Bun/creat. Start Bystolic 10mg  daily for Bp. Monitor Bp bid.   Depression with anxiety Crys at night, cannot return to sleep after bathroom trips(2-3x/night), stated she hates this place([facility]). She believes her son in law takes advantage of her money. Failed Zoloft, Mirtazapine, and Trazodone. Will change Trazodone to qod for 1 week, then dc. Try Lexapro to stabilize her mood.   Senile dementia MMSE 18/30 05/2013 per Dtr. May consider Namenda.   Osteoporosis, unspecified POA the patient's dtr: Reclast  q110ms.  Insomnia Early am awake and difficulty return to sleep at after bathroom trips. Trazodone is not effective. Will taper it off. Try Lexapro to stabilize mood.   Urinary frequency Chronic. Has been evaluated by Dr. Amalia Hailey.   Hiatal hernia Stable.   SVT (supraventricular tachycardia) hx  Hx of syncope Hx   Carotid atherosclerosis F/u cardiology.   Hemorrhoids 2 small external hemorrhoids at 11am.   HOH (hard of hearing) Speaker has to adjust tone of voice to be heard.   Macular degeneration Difficulty with reading.   Diverticulosis Last colonoscopy 05/23/07  Hx of pancreatitis Hx     Family/ Staff Communication: observe the patient.   Goals of Care: [place]  Labs/tests ordered: none

## 2013-06-28 NOTE — Assessment & Plan Note (Signed)
Chronic. Has been evaluated by Dr. Evans.    

## 2013-06-28 NOTE — Assessment & Plan Note (Signed)
Stable, taking MiraLax daily.    

## 2013-06-28 NOTE — Assessment & Plan Note (Signed)
Difficulty with reading.

## 2013-06-28 NOTE — Assessment & Plan Note (Addendum)
Early am awake and difficulty return to sleep at after bathroom trips. Trazodone is not effective. Will taper it off. Try Lexapro to stabilize mood.

## 2013-06-28 NOTE — Assessment & Plan Note (Signed)
Speaker has to adjust tone of voice to be heard.

## 2013-06-28 NOTE — Assessment & Plan Note (Signed)
2 small external hemorrhoids at 11am.

## 2013-06-28 NOTE — Assessment & Plan Note (Signed)
Continue Novasc 10mg . Dc Diovan/HCT due to elevated Bun/creat. Start Bystolic 10mg  daily for Bp. Monitor Bp bid.

## 2013-06-28 NOTE — Assessment & Plan Note (Signed)
POA the patient's dtr: Reclast q32ms.

## 2013-06-28 NOTE — Assessment & Plan Note (Signed)
Crys at night, cannot return to sleep after bathroom trips(2-3x/night), stated she hates this place([facility]). She believes her son in law takes advantage of her money. Failed Zoloft, Mirtazapine, and Trazodone. Will change Trazodone to qod for 1 week, then dc. Try Lexapro to stabilize her mood.

## 2013-06-28 NOTE — Assessment & Plan Note (Addendum)
Hgb 9.9, MCV and MCH wnl 88/30.4 05/25/13. Fecal occult blood negative 06/28/13. Will repeat CBC and obtain anemia panel. Her baseline Hgb is 10s.

## 2013-07-03 LAB — CBC AND DIFFERENTIAL
HCT: 26 % — AB (ref 36–46)
HEMOGLOBIN: 8.9 g/dL — AB (ref 12.0–16.0)
Platelets: 191 10*3/uL (ref 150–399)
WBC: 4.6 10*3/mL

## 2013-07-12 ENCOUNTER — Non-Acute Institutional Stay: Payer: Medicare Other | Admitting: Nurse Practitioner

## 2013-07-12 ENCOUNTER — Encounter: Payer: Self-pay | Admitting: Nurse Practitioner

## 2013-07-12 DIAGNOSIS — G4700 Insomnia, unspecified: Secondary | ICD-10-CM

## 2013-07-12 DIAGNOSIS — K5900 Constipation, unspecified: Secondary | ICD-10-CM

## 2013-07-12 DIAGNOSIS — I10 Essential (primary) hypertension: Secondary | ICD-10-CM

## 2013-07-12 DIAGNOSIS — D638 Anemia in other chronic diseases classified elsewhere: Secondary | ICD-10-CM

## 2013-07-12 DIAGNOSIS — N189 Chronic kidney disease, unspecified: Secondary | ICD-10-CM

## 2013-07-12 DIAGNOSIS — E039 Hypothyroidism, unspecified: Secondary | ICD-10-CM

## 2013-07-12 NOTE — Assessment & Plan Note (Signed)
Bun/creat 57/1.71 05/25/13. off Diovan/HCT. Update BMP

## 2013-07-12 NOTE — Assessment & Plan Note (Signed)
Continue Novasc 44m and Bystolic 10mg  daily for Bp. Controlled.

## 2013-07-12 NOTE — Assessment & Plan Note (Signed)
Continue Levothyroxine 25mcg daily. TSH 1.76 05/25/13 

## 2013-07-12 NOTE — Assessment & Plan Note (Signed)
Early am awake and difficulty return to sleep at after bathroom trips. Trazodone is off. Better with Lexapro 10mg .

## 2013-07-12 NOTE — Progress Notes (Signed)
Patient ID: Patricia Nelson, female   DOB: [birth year], 80 y.o.   MRN: [id number]   Code Status: DNR. POA  Allergies  Allergen Reactions  . Ace Inhibitors     cough  . Mirtazapine     Nightmares   . Penicillins Rash    Chief Complaint  Patient presents with  . Medical Management of Chronic Issues  . Acute Visit    IDA    HPI: Patient is a 80 y.o. female seen in the AL at [facility] today for anemia and other chronic medical conditions.  Problem List Items Addressed This Visit   Anemia of chronic disease - Primary     Hgb 9.9, MCV and MCH wnl 88/30.4 05/25/13. Fecal occult blood negative 06/28/13. Her baseline Hgb was 10s. Worse. Hgb 8.9 07/03/13. Normal Iron 52, B12 641, Folate 15.5. Will try Prilosec 20mg  daily x 6 weeks. Repeat CBC in 2 weeks.     Chronic renal disease     Bun/creat 57/1.71 05/25/13. off Diovan/HCT. Update BMP     HTN (hypertension)     Continue Novasc 23m and Bystolic 10mg  daily for Bp. Controlled.      Insomnia     Early am awake and difficulty return to sleep at after bathroom trips. Trazodone is off. Better with Lexapro 10mg .     Unspecified constipation     Stable, taking MiraLax daily.      Unspecified hypothyroidism     Continue Levothyroxine 70mcg daily. TSH 1.76 05/25/13        Review of Systems:  Review of Systems  Constitutional: Positive for weight loss. Negative for fever, chills, malaise/fatigue and diaphoresis.       #4Ibs in the past 6 months  HENT: Positive for hearing loss. Negative for congestion, ear discharge, ear pain, nosebleeds, sore throat and tinnitus.   Eyes: Negative for blurred vision, double vision, photophobia, pain, discharge and redness.  Respiratory: Negative for cough, hemoptysis, sputum production, shortness of breath, wheezing and stridor.   Cardiovascular: Negative for chest pain, palpitations, orthopnea, claudication, leg swelling and PND.  Gastrointestinal: Negative for heartburn, nausea, vomiting,  abdominal pain, diarrhea, constipation, blood in stool and melena.  Genitourinary: Positive for frequency. Negative for dysuria, urgency, hematuria and flank pain.       Better at night.   Musculoskeletal: Negative for back pain, falls, joint pain, myalgias and neck pain.       Ambulates with walker.   Skin: Negative for itching and rash.  Neurological: Negative for dizziness, tingling, tremors, sensory change, speech change, focal weakness, seizures, loss of consciousness, weakness and headaches.  Endo/Heme/Allergies: Negative for environmental allergies and polydipsia. Does not bruise/bleed easily.  Psychiatric/Behavioral: Positive for depression and memory loss. Negative for suicidal ideas, hallucinations and substance abuse. The patient is nervous/anxious and has insomnia.        Sleeps and mood are improved on Lexapro     Past Medical History  Diagnosis Date  . Arthritis   . Depression   . Cataract   . Thyroid disease   . Anxiety   . Osteoporosis   . Hypertension   . Atherosclerotic cerebrovascular disease   . Hyperlipidemia   . Pancreatitis   . Other sleep disturbances   . Backache, unspecified   . Hemorrhage of rectum and anus 03/26/2013  . Diseases of lips 03/26/2013  . Senile osteoporosis     with old T11 fracture  . Chronic kidney disease, unspecified   . Other malaise and fatigue   .  Anorexia   . Insomnia, unspecified   . Other symptoms involving cardiovascular system   . Other specified cardiac dysrhythmias(427.89)   . Diaphragmatic hernia without mention of obstruction or gangrene   . Macular degeneration (senile) of retina, unspecified   . Personal history of other diseases of digestive system     pancreatitis from gallstones  . Cholelithiasis   . Unspecified disorders of arteries and arterioles   . Other specified personal history presenting hazards to health(V15.89)   . Personal history of other diseases of circulatory system   . Unspecified hemorrhoids  without mention of complication   . Unspecified hearing loss   . Anemia, unspecified   . Rosacea   . Personal history of traumatic fracture   . Personal history of other diseases of respiratory system   . Diverticulosis of colon (without mention of hemorrhage)    Past Surgical History  Procedure Laterality Date  . Abdominal hysterectomy      and BSO fibroids  . Joint replacement  1996  . Colonoscopy  05/23/2007  . Total hip arthroplasty Right    Social History:   reports that she has never smoked. She does not have any smokeless tobacco history on file. She reports that she does not drink alcohol or use illicit drugs.  Family History  Problem Relation Age of Onset  . Heart disease Mother   . Diabetes Mother   . Heart disease Father   . Diabetes Sister   . Diabetes Brother   . Diabetes Daughter     Medications: Patient's Medications  New Prescriptions   No medications on file  Previous Medications   ACETAMINOPHEN (TYLENOL) 500 MG TABLET    Take 500 mg by mouth. Take one twice daily for pain   AMLODIPINE (NORVASC) 10 MG TABLET    Take 10 mg by mouth daily.   ASCORBIC ACID (VITAMIN C) 1000 MG TABLET    Take 1,000 mg by mouth daily. Take one daily   ASPIRIN 81 MG TABLET    Take 81 mg by mouth daily.   CALCIUM CARBONATE-VITAMIN D (CALCIUM 600+D) 600-400 MG-UNIT PER TABLET    Take 1 tablet by mouth daily.   CHOLECALCIFEROL (VITAMIN D) 1000 UNITS TABLET    Take 1,000 Units by mouth daily.   CLINDAMYCIN (CLEOCIN) 150 MG CAPSULE    Take 150 mg by mouth. 4 caps one hour prior dental   COENZYME Q10 (CO Q-10) 200 MG CAPS    Take by mouth. Take one daily   GUAIFENESIN (HUMIBID E) 400 MG TABS TABLET    Take 400 mg by mouth. Every 4 hours as needed cough   IBUPROFEN (ADVIL,MOTRIN) 400 MG TABLET    Take 400 mg by mouth every 4 (four) hours as needed.   LEVOTHYROXINE (SYNTHROID, LEVOTHROID) 25 MCG TABLET    Take 25 mcg by mouth daily.   MIRTAZAPINE (REMERON) 30 MG TABLET    One at bedtime     MULTIPLE VITAMINS-MINERALS (ICAPS PO)    Take by mouth. Take one daily for eyes   OMEGA-3 FATTY ACIDS (FISH OIL) 1000 MG CAPS    Take by mouth. Take one tablet daily   POLYETHYLENE GLYCOL (MIRALAX / GLYCOLAX) PACKET    Take 17 g by mouth daily.   RESTASIS 0.05 % OPHTHALMIC EMULSION    One drop both eyes twice daily   ROSUVASTATIN (CRESTOR) 5 MG TABLET    Take 5 mg by mouth daily.   TRAZODONE (DESYREL) 50 MG TABLET  Take 1/2 tablet at bedtime   VALSARTAN-HYDROCHLOROTHIAZIDE (DIOVAN-HCT) 320-25 MG PER TABLET    Take 320 tablets by mouth.  Modified Medications   No medications on file  Discontinued Medications   FERROUS SULFATE 325 (65 FE) MG TABLET    Take 325 mg by mouth 2 (two) times daily with a meal.     Physical Exam: Physical Exam  Constitutional: She is oriented to person, place, and time. She appears well-developed and well-nourished. No distress.  HENT:  Head: Normocephalic and atraumatic.  Right Ear: External ear normal.  Left Ear: External ear normal.  Nose: Nose normal.  Mouth/Throat: Oropharynx is clear and moist. No oropharyngeal exudate.  Eyes: Conjunctivae and EOM are normal. Pupils are equal, round, and reactive to light. Right eye exhibits no discharge. Left eye exhibits no discharge. No scleral icterus.  Neck: Normal range of motion. Neck supple. No JVD present. No tracheal deviation present. No thyromegaly present.  Cardiovascular: Normal rate, regular rhythm, normal heart sounds and intact distal pulses.  Exam reveals no gallop and no friction rub.   No murmur heard. Pulmonary/Chest: Effort normal and breath sounds normal. No stridor. No respiratory distress. She has no wheezes. She has no rales. She exhibits no tenderness.  Abdominal: Soft. Bowel sounds are normal. She exhibits no distension and no mass. There is no tenderness. There is no rebound and no guarding.  2 small hemorrhoids at 11 am.   Genitourinary: Guaiac negative stool.  Musculoskeletal: Normal  range of motion. She exhibits no edema and no tenderness.  Lymphadenopathy:    She has no cervical adenopathy.  Neurological: She is alert and oriented to person, place, and time. She has normal reflexes. No cranial nerve deficit. She exhibits abnormal muscle tone. Coordination normal.  Skin: Skin is warm. No rash noted. She is not diaphoretic. No erythema. No pallor.  Psychiatric: Her mood appears anxious. Her affect is angry. Her affect is not blunt, not labile and not inappropriate. Her speech is not delayed, not tangential and not slurred. She is not agitated, not aggressive, not hyperactive, not slowed, not withdrawn, not actively hallucinating and not combative. Thought content is paranoid. Thought content is not delusional. Cognition and memory are impaired. She does not express impulsivity or inappropriate judgment. She exhibits a depressed mood. She expresses no homicidal and no suicidal ideation. She expresses no suicidal plans and no homicidal plans. She is communicative. She exhibits abnormal recent memory and abnormal remote memory.  Improved.  She is attentive.    Filed Vitals:   07/12/13 0951  BP: 110/64  Pulse: 68  Temp: 97.6 F (36.4 C)  TempSrc: Tympanic  Resp: 18      Labs reviewed: Basic Metabolic Panel:  Recent Labs  05/25/13  NA 141  K 4.4  BUN 57*  CREATININE 1.7*  TSH 1.76   CBC:  Recent Labs  05/25/13 07/03/13  WBC 5.4 4.6  HGB 9.9* 8.9*  HCT 29* 26*  PLT 215 191    Past Procedures:  10/24/12 EKG: sinus rhythm with marked sinus arrhythmia.   05/16/13 X-ray thoracic spine: mold compression fractures no evidence of a recent fracture. However, a subtle increase in an old fracture may be inapparent radio graphically. Assessment/Plan Anemia of chronic disease Hgb 9.9, MCV and MCH wnl 88/30.4 05/25/13. Fecal occult blood negative 06/28/13. Her baseline Hgb was 10s. Worse. Hgb 8.9 07/03/13. Normal Iron 52, B12 641, Folate 15.5. Will try Prilosec 20mg  daily x  6 weeks. Repeat CBC in 2 weeks.  Unspecified constipation Stable, taking MiraLax daily.    Unspecified hypothyroidism Continue Levothyroxine 73mcg daily. TSH 1.76 05/25/13   HTN (hypertension) Continue Novasc 70m and Bystolic 10mg  daily for Bp. Controlled.    Insomnia Early am awake and difficulty return to sleep at after bathroom trips. Trazodone is off. Better with Lexapro 10mg .   Chronic renal disease Bun/creat 57/1.71 05/25/13. off Diovan/HCT. Update BMP     Family/ Staff Communication: observe the patient.   Goals of Care: AL  Labs/tests ordered: CBC and BMP in 2 weeks.

## 2013-07-12 NOTE — Assessment & Plan Note (Signed)
Hgb 9.9, MCV and MCH wnl 88/30.4 05/25/13. Fecal occult blood negative 06/28/13. Her baseline Hgb was 10s. Worse. Hgb 8.9 07/03/13. Normal Iron 52, B12 641, Folate 15.5. Will try Prilosec 20mg  daily x 6 weeks. Repeat CBC in 2 weeks.

## 2013-07-12 NOTE — Assessment & Plan Note (Signed)
Stable, taking MiraLax daily.    

## 2013-07-26 LAB — CBC AND DIFFERENTIAL
HEMATOCRIT: 28 % — AB (ref 36–46)
Hemoglobin: 9.5 g/dL — AB (ref 12.0–16.0)
PLATELETS: 208 10*3/uL (ref 150–399)
WBC: 4 10*3/mL

## 2013-07-26 LAB — BASIC METABOLIC PANEL
BUN: 31 mg/dL — AB (ref 4–21)
Creatinine: 1.2 mg/dL — AB (ref 0.5–1.1)
Glucose: 108 mg/dL
Potassium: 4 mmol/L (ref 3.4–5.3)
Sodium: 142 mmol/L (ref 137–147)

## 2013-08-02 ENCOUNTER — Other Ambulatory Visit: Payer: Self-pay | Admitting: Nurse Practitioner

## 2013-08-02 DIAGNOSIS — N189 Chronic kidney disease, unspecified: Secondary | ICD-10-CM

## 2013-08-02 DIAGNOSIS — D638 Anemia in other chronic diseases classified elsewhere: Secondary | ICD-10-CM

## 2013-10-04 ENCOUNTER — Encounter: Payer: Self-pay | Admitting: Nurse Practitioner

## 2013-10-04 ENCOUNTER — Non-Acute Institutional Stay: Payer: Medicare Other | Admitting: Nurse Practitioner

## 2013-10-04 VITALS — BP 158/60 | HR 60 | Wt 114.0 lb

## 2013-10-04 DIAGNOSIS — R350 Frequency of micturition: Secondary | ICD-10-CM

## 2013-10-04 DIAGNOSIS — N182 Chronic kidney disease, stage 2 (mild): Secondary | ICD-10-CM

## 2013-10-04 DIAGNOSIS — M810 Age-related osteoporosis without current pathological fracture: Secondary | ICD-10-CM

## 2013-10-04 DIAGNOSIS — F341 Dysthymic disorder: Secondary | ICD-10-CM

## 2013-10-04 DIAGNOSIS — E039 Hypothyroidism, unspecified: Secondary | ICD-10-CM

## 2013-10-04 DIAGNOSIS — D638 Anemia in other chronic diseases classified elsewhere: Secondary | ICD-10-CM

## 2013-10-04 DIAGNOSIS — F418 Other specified anxiety disorders: Secondary | ICD-10-CM

## 2013-10-04 DIAGNOSIS — I10 Essential (primary) hypertension: Secondary | ICD-10-CM

## 2013-10-04 DIAGNOSIS — G4700 Insomnia, unspecified: Secondary | ICD-10-CM

## 2013-10-04 DIAGNOSIS — K5900 Constipation, unspecified: Secondary | ICD-10-CM

## 2013-10-04 NOTE — Progress Notes (Signed)
Patient ID: Patricia Nelson, female   DOB: February 24, 1915, 80 y.o.   MRN: [id number]   Code Status: DNR. POA  Allergies  Allergen Reactions  . Ace Inhibitors     cough  . Mirtazapine     Nightmares   . Penicillins Rash    Chief Complaint  Patient presents with  . Medical Management of Chronic Issues    blood pressure, anemia, thyroid. With daughter [name]    HPI: Patient is a 80 y.o. female seen in the clinic at [facility] today for management of chronic medical conditions  Problem List Items Addressed This Visit   Anemia of chronic disease - Primary     07/03/13 Hgb 8.9 07/03/13. Normal Iron 52, B12 641, Folate 15.5  07/26/13 Hgb 9.5 10/04/13 update CBC     Chronic renal disease     07/03/13 Bun/creat 31/1.17. Bun/creat 57/1.71 05/25/13. off Diovan/HCT. Update BMP      Depression with anxiety     Crys at night, cannot return to sleep after bathroom trips(2-3x/night), stated she hates this place([facility]). She believes her son in law takes advantage of her money. Failed Zoloft, Mirtazapine, and Trazodone. Better Lexapro to stabilize her mood.      HTN (hypertension)     Continue Novasc 30m and Bystolic 10mg  daily for Bp. Controlled. Off Diovan/HCT       Relevant Medications      atorvastatin (LIPITOR) 10 MG tablet      BYSTOLIC 10 MG tablet   Insomnia     Sleeps much better since Lexapro 10mg     Osteoporosis, unspecified     Due for Prolia.     Unspecified constipation     Stable, taking MiraLax daily.       Unspecified hypothyroidism     Continue Levothyroxine 33mcg daily. TSH 1.76 05/25/13, update TSH     Relevant Medications      BYSTOLIC 10 MG tablet   Urinary frequency     Chronic. Has been evaluated by Dr. Amalia Hailey. Wear dependents for urinary leakage.          Review of Systems:  Review of Systems  Constitutional: Positive for weight loss. Negative for fever, chills, malaise/fatigue and diaphoresis.       #6-7 Ibs in the past year  HENT: Positive  for hearing loss. Negative for congestion, ear discharge, ear pain, nosebleeds, sore throat and tinnitus.   Eyes: Negative for blurred vision, double vision, photophobia, pain, discharge and redness.  Respiratory: Negative for cough, hemoptysis, sputum production, shortness of breath, wheezing and stridor.   Cardiovascular: Negative for chest pain, palpitations, orthopnea, claudication, leg swelling and PND.  Gastrointestinal: Negative for heartburn, nausea, vomiting, abdominal pain, diarrhea, constipation, blood in stool and melena.  Genitourinary: Positive for frequency. Negative for dysuria, urgency, hematuria and flank pain.       Better at night.   Musculoskeletal: Negative for back pain, falls, joint pain, myalgias and neck pain.       Ambulates with walker.   Skin: Negative for itching and rash.  Neurological: Negative for dizziness, tingling, tremors, sensory change, speech change, focal weakness, seizures, loss of consciousness, weakness and headaches.  Endo/Heme/Allergies: Negative for environmental allergies and polydipsia. Does not bruise/bleed easily.  Psychiatric/Behavioral: Positive for depression and memory loss. Negative for suicidal ideas, hallucinations and substance abuse. The patient is nervous/anxious and has insomnia.        Sleeps and mood are improved on Lexapro     Past Medical History  Diagnosis Date  . Arthritis   . Depression   . Cataract   . Thyroid disease   . Anxiety   . Osteoporosis   . Hypertension   . Atherosclerotic cerebrovascular disease   . Hyperlipidemia   . Pancreatitis   . Other sleep disturbances   . Backache, unspecified   . Hemorrhage of rectum and anus 03/26/2013  . Diseases of lips 03/26/2013  . Senile osteoporosis     with old T11 fracture  . Chronic kidney disease, unspecified   . Other malaise and fatigue   . Anorexia   . Insomnia, unspecified   . Other symptoms involving cardiovascular system   . Other specified cardiac  dysrhythmias(427.89)   . Diaphragmatic hernia without mention of obstruction or gangrene   . Macular degeneration (senile) of retina, unspecified   . Personal history of other diseases of digestive system     pancreatitis from gallstones  . Cholelithiasis   . Unspecified disorders of arteries and arterioles   . Other specified personal history presenting hazards to health(V15.89)   . Personal history of other diseases of circulatory system   . Unspecified hemorrhoids without mention of complication   . Unspecified hearing loss   . Anemia, unspecified   . Rosacea   . Personal history of traumatic fracture   . Personal history of other diseases of respiratory system   . Diverticulosis of colon (without mention of hemorrhage)    Past Surgical History  Procedure Laterality Date  . Abdominal hysterectomy      and BSO fibroids  . Joint replacement  1996  . Colonoscopy  05/23/2007  . Total hip arthroplasty Right    Social History:   reports that she has never smoked. She does not have any smokeless tobacco history on file. She reports that she does not drink alcohol or use illicit drugs.  Family History  Problem Relation Age of Onset  . Heart disease Mother   . Diabetes Mother   . Heart disease Father   . Diabetes Sister   . Diabetes Brother   . Diabetes Daughter     Medications: Patient's Medications  New Prescriptions   No medications on file  Previous Medications   ACETAMINOPHEN (TYLENOL) 500 MG TABLET    Take 500 mg by mouth. Take one twice daily for pain   AMLODIPINE (NORVASC) 10 MG TABLET    Take 10 mg by mouth daily.   ASCORBIC ACID (VITAMIN C) 1000 MG TABLET    Take 1,000 mg by mouth daily. Take one daily   ASPIRIN 81 MG TABLET    Take 81 mg by mouth daily.   ATORVASTATIN (LIPITOR) 10 MG TABLET    Take one tablet daily   BYSTOLIC 10 MG TABLET    Take one tablet daily for blood pressusre   CALCIUM CARBONATE-VITAMIN D (CALCIUM 600+D) 600-400 MG-UNIT PER TABLET    Take 1  tablet by mouth 2 (two) times daily.    CHOLECALCIFEROL (VITAMIN D) 1000 UNITS TABLET    Take 1,000 Units by mouth daily.   CLINDAMYCIN (CLEOCIN) 150 MG CAPSULE    Take 150 mg by mouth. 4 caps one hour prior dental   COENZYME Q10 (CO Q-10) 200 MG CAPS    Take by mouth. Take one daily   ESCITALOPRAM (LEXAPRO) 10 MG TABLET    Take one tablet daily   GUAIFENESIN (HUMIBID E) 400 MG TABS TABLET    Take 400 mg by mouth. Every 4 hours as needed cough  IBUPROFEN (ADVIL,MOTRIN) 400 MG TABLET    Take 400 mg by mouth every 4 (four) hours as needed.   LEVOTHYROXINE (SYNTHROID, LEVOTHROID) 25 MCG TABLET    Take 25 mcg by mouth daily.   MIRTAZAPINE (REMERON) 30 MG TABLET    One at bedtime   MULTIPLE VITAMINS-MINERALS (ICAPS PO)    Take by mouth. Take one daily for eyes   OMEGA-3 FATTY ACIDS (FISH OIL) 1000 MG CAPS    Take by mouth. Take one tablet daily   POLYETHYL GLYCOL-PROPYL GLYCOL (SYSTANE) 0.4-0.3 % SOLN    Apply to eye. One drop in both eyes as needed for dry eyes   POLYETHYLENE GLYCOL (MIRALAX / GLYCOLAX) PACKET    Take 17 g by mouth daily.   RESTASIS 0.05 % OPHTHALMIC EMULSION    One drop both eyes twice daily   TRAZODONE (DESYREL) 50 MG TABLET    Take 1/2 tablet at bedtime  Modified Medications   No medications on file  Discontinued Medications   ROSUVASTATIN (CRESTOR) 5 MG TABLET    Take 5 mg by mouth daily.   VALSARTAN-HYDROCHLOROTHIAZIDE (DIOVAN-HCT) 320-25 MG PER TABLET    Take 320 tablets by mouth.     Physical Exam: Physical Exam  Constitutional: She is oriented to person, place, and time. She appears well-developed and well-nourished. No distress.  HENT:  Head: Normocephalic and atraumatic.  Right Ear: External ear normal.  Left Ear: External ear normal.  Nose: Nose normal.  Mouth/Throat: Oropharynx is clear and moist. No oropharyngeal exudate.  Eyes: Conjunctivae and EOM are normal. Pupils are equal, round, and reactive to light. Right eye exhibits no discharge. Left eye exhibits  no discharge. No scleral icterus.  Neck: Normal range of motion. Neck supple. No JVD present. No tracheal deviation present. No thyromegaly present.  Cardiovascular: Normal rate, regular rhythm, normal heart sounds and intact distal pulses.  Exam reveals no gallop and no friction rub.   No murmur heard. Pulmonary/Chest: Effort normal and breath sounds normal. No stridor. No respiratory distress. She has no wheezes. She has no rales. She exhibits no tenderness.  Abdominal: Soft. Bowel sounds are normal. She exhibits no distension and no mass. There is no tenderness. There is no rebound and no guarding.  2 small hemorrhoids at 11 am.   Genitourinary: Guaiac negative stool.  Musculoskeletal: Normal range of motion. She exhibits no edema and no tenderness.  Lymphadenopathy:    She has no cervical adenopathy.  Neurological: She is alert and oriented to person, place, and time. She has normal reflexes. No cranial nerve deficit. She exhibits abnormal muscle tone. Coordination normal.  Skin: Skin is warm. No rash noted. She is not diaphoretic. No erythema. No pallor.  Psychiatric: Her mood appears anxious. Her affect is angry. Her affect is not blunt, not labile and not inappropriate. Her speech is not delayed, not tangential and not slurred. She is not agitated, not aggressive, not hyperactive, not slowed, not withdrawn, not actively hallucinating and not combative. Thought content is paranoid. Thought content is not delusional. Cognition and memory are impaired. She does not express impulsivity or inappropriate judgment. She exhibits a depressed mood. She expresses no homicidal and no suicidal ideation. She expresses no suicidal plans and no homicidal plans. She is communicative. She exhibits abnormal recent memory and abnormal remote memory.  Improved.  She is attentive.    Filed Vitals:   10/04/13 1435  BP: 158/60  Pulse: 60  Weight: 114 lb (51.71 kg)      Labs  reviewed: Basic Metabolic  Panel:  Recent Labs  05/25/13 07/26/13 10/09/13  NA 141 142 139  K 4.4 4.0 4.0  BUN 57* 31* 37*  CREATININE 1.7* 1.2* 1.2*  TSH 1.76  --   --    CBC:  Recent Labs  07/03/13 07/26/13 10/09/13  WBC 4.6 4.0 3.9  HGB 8.9* 9.5* 9.3*  HCT 26* 28* 28*  PLT 191 208 179    Past Procedures:  10/24/12 EKG: sinus rhythm with marked sinus arrhythmia.   05/16/13 X-ray thoracic spine: mold compression fractures no evidence of a recent fracture. However, a subtle increase in an old fracture may be inapparent radio graphically. Assessment/Plan Anemia of chronic disease 07/03/13 Hgb 8.9 07/03/13. Normal Iron 52, B12 641, Folate 15.5  07/26/13 Hgb 9.5 10/04/13 update CBC   Unspecified constipation Stable, taking MiraLax daily.     HTN (hypertension) Continue Novasc 35m and Bystolic 10mg  daily for Bp. Controlled. Off Diovan/HCT     Depression with anxiety Crys at night, cannot return to sleep after bathroom trips(2-3x/night), stated she hates this place([facility]). She believes her son in law takes advantage of her money. Failed Zoloft, Mirtazapine, and Trazodone. Better Lexapro to stabilize her mood.    Chronic renal disease 07/03/13 Bun/creat 31/1.17. Bun/creat 57/1.71 05/25/13. off Diovan/HCT. Update BMP    Osteoporosis, unspecified Due for Prolia.   Urinary frequency Chronic. Has been evaluated by Dr. Amalia Hailey. Wear dependents for urinary leakage.     Unspecified hypothyroidism Continue Levothyroxine 46mcg daily. TSH 1.76 05/25/13, update TSH   Insomnia Sleeps much better since Lexapro 10mg     Family/ Staff Communication: observe the patient.   Goals of Care: AL  Labs/tests ordered: CBC, BMP, TSH

## 2013-10-04 NOTE — Assessment & Plan Note (Signed)
07/03/13 Hgb 8.9 07/03/13. Normal Iron 52, B12 641, Folate 15.5  07/26/13 Hgb 9.5 10/04/13 update CBC

## 2013-10-04 NOTE — Assessment & Plan Note (Signed)
07/03/13 Bun/creat 31/1.17. Bun/creat 57/1.71 05/25/13. off Diovan/HCT. Update BMP

## 2013-10-04 NOTE — Assessment & Plan Note (Signed)
Crys at night, cannot return to sleep after bathroom trips(2-3x/night), stated she hates this place([facility]). She believes her son in law takes advantage of her money. Failed Zoloft, Mirtazapine, and Trazodone. Better Lexapro to stabilize her mood.

## 2013-10-04 NOTE — Assessment & Plan Note (Signed)
Continue Novasc 70m and Bystolic 10mg  daily for Bp. Controlled. Off Diovan/HCT

## 2013-10-04 NOTE — Assessment & Plan Note (Signed)
Stable, taking MiraLax daily.

## 2013-10-04 NOTE — Assessment & Plan Note (Signed)
Due for Prolia.

## 2013-10-04 NOTE — Assessment & Plan Note (Signed)
Chronic. Has been evaluated by Dr. Evans. Wear dependents for urinary leakage.    

## 2013-10-09 LAB — CBC AND DIFFERENTIAL
HEMATOCRIT: 28 % — AB (ref 36–46)
Hemoglobin: 9.3 g/dL — AB (ref 12.0–16.0)
PLATELETS: 179 10*3/uL (ref 150–399)
WBC: 3.9 10^3/mL

## 2013-10-09 LAB — BASIC METABOLIC PANEL
BUN: 37 mg/dL — AB (ref 4–21)
CREATININE: 1.2 mg/dL — AB (ref 0.5–1.1)
GLUCOSE: 86 mg/dL
Potassium: 4 mmol/L (ref 3.4–5.3)
Sodium: 139 mmol/L (ref 137–147)

## 2013-10-11 ENCOUNTER — Other Ambulatory Visit: Payer: Self-pay | Admitting: Nurse Practitioner

## 2013-10-11 DIAGNOSIS — D638 Anemia in other chronic diseases classified elsewhere: Secondary | ICD-10-CM

## 2013-10-11 NOTE — Assessment & Plan Note (Addendum)
Continue Levothyroxine 56mcg daily. TSH 1.76 05/25/13, update TSH

## 2013-10-11 NOTE — Assessment & Plan Note (Signed)
Sleeps much better since Lexapro 10mg 

## 2013-10-26 LAB — TSH: TSH: 2.12 u[IU]/mL (ref 0.41–5.90)

## 2013-11-01 ENCOUNTER — Other Ambulatory Visit: Payer: Self-pay | Admitting: Nurse Practitioner

## 2013-11-01 DIAGNOSIS — E039 Hypothyroidism, unspecified: Secondary | ICD-10-CM

## 2013-11-10 ENCOUNTER — Emergency Department (HOSPITAL_COMMUNITY): Payer: Medicare Other

## 2013-11-10 ENCOUNTER — Encounter (HOSPITAL_COMMUNITY): Payer: Self-pay | Admitting: Emergency Medicine

## 2013-11-10 ENCOUNTER — Emergency Department (HOSPITAL_COMMUNITY)
Admission: EM | Admit: 2013-11-10 | Discharge: 2013-11-10 | Disposition: A | Discharge status: Home / Self Care | Payer: Medicare Other | Attending: Emergency Medicine | Admitting: Emergency Medicine

## 2013-11-10 DIAGNOSIS — W1809XA Striking against other object with subsequent fall, initial encounter: Secondary | ICD-10-CM | POA: Insufficient documentation

## 2013-11-10 DIAGNOSIS — S8000XA Contusion of unspecified knee, initial encounter: Secondary | ICD-10-CM | POA: Insufficient documentation

## 2013-11-10 DIAGNOSIS — S0100XA Unspecified open wound of scalp, initial encounter: Secondary | ICD-10-CM | POA: Diagnosis not present

## 2013-11-10 DIAGNOSIS — Z23 Encounter for immunization: Secondary | ICD-10-CM | POA: Diagnosis not present

## 2013-11-10 DIAGNOSIS — Z862 Personal history of diseases of the blood and blood-forming organs and certain disorders involving the immune mechanism: Secondary | ICD-10-CM | POA: Insufficient documentation

## 2013-11-10 DIAGNOSIS — N189 Chronic kidney disease, unspecified: Secondary | ICD-10-CM | POA: Diagnosis not present

## 2013-11-10 DIAGNOSIS — I10 Essential (primary) hypertension: Secondary | ICD-10-CM | POA: Diagnosis not present

## 2013-11-10 DIAGNOSIS — Z7982 Long term (current) use of aspirin: Secondary | ICD-10-CM | POA: Diagnosis not present

## 2013-11-10 DIAGNOSIS — E785 Hyperlipidemia, unspecified: Secondary | ICD-10-CM | POA: Insufficient documentation

## 2013-11-10 DIAGNOSIS — R61 Generalized hyperhidrosis: Secondary | ICD-10-CM | POA: Insufficient documentation

## 2013-11-10 DIAGNOSIS — Z8669 Personal history of other diseases of the nervous system and sense organs: Secondary | ICD-10-CM | POA: Diagnosis not present

## 2013-11-10 DIAGNOSIS — Z8781 Personal history of (healed) traumatic fracture: Secondary | ICD-10-CM | POA: Diagnosis not present

## 2013-11-10 DIAGNOSIS — F3289 Other specified depressive episodes: Secondary | ICD-10-CM | POA: Diagnosis not present

## 2013-11-10 DIAGNOSIS — M129 Arthropathy, unspecified: Secondary | ICD-10-CM | POA: Insufficient documentation

## 2013-11-10 DIAGNOSIS — F329 Major depressive disorder, single episode, unspecified: Secondary | ICD-10-CM | POA: Insufficient documentation

## 2013-11-10 DIAGNOSIS — E079 Disorder of thyroid, unspecified: Secondary | ICD-10-CM | POA: Insufficient documentation

## 2013-11-10 DIAGNOSIS — Z8709 Personal history of other diseases of the respiratory system: Secondary | ICD-10-CM | POA: Insufficient documentation

## 2013-11-10 DIAGNOSIS — S8001XA Contusion of right knee, initial encounter: Secondary | ICD-10-CM

## 2013-11-10 DIAGNOSIS — S0990XA Unspecified injury of head, initial encounter: Secondary | ICD-10-CM | POA: Diagnosis present

## 2013-11-10 DIAGNOSIS — F411 Generalized anxiety disorder: Secondary | ICD-10-CM | POA: Diagnosis not present

## 2013-11-10 DIAGNOSIS — M810 Age-related osteoporosis without current pathological fracture: Secondary | ICD-10-CM | POA: Diagnosis not present

## 2013-11-10 DIAGNOSIS — Y9289 Other specified places as the place of occurrence of the external cause: Secondary | ICD-10-CM | POA: Insufficient documentation

## 2013-11-10 DIAGNOSIS — Z8719 Personal history of other diseases of the digestive system: Secondary | ICD-10-CM | POA: Insufficient documentation

## 2013-11-10 DIAGNOSIS — Y9389 Activity, other specified: Secondary | ICD-10-CM | POA: Insufficient documentation

## 2013-11-10 DIAGNOSIS — W19XXXA Unspecified fall, initial encounter: Secondary | ICD-10-CM

## 2013-11-10 DIAGNOSIS — Z79899 Other long term (current) drug therapy: Secondary | ICD-10-CM | POA: Diagnosis not present

## 2013-11-10 DIAGNOSIS — Z880 Allergy status to penicillin: Secondary | ICD-10-CM | POA: Diagnosis not present

## 2013-11-10 DIAGNOSIS — I129 Hypertensive chronic kidney disease with stage 1 through stage 4 chronic kidney disease, or unspecified chronic kidney disease: Secondary | ICD-10-CM | POA: Insufficient documentation

## 2013-11-10 DIAGNOSIS — S0101XA Laceration without foreign body of scalp, initial encounter: Secondary | ICD-10-CM

## 2013-11-10 LAB — COMPREHENSIVE METABOLIC PANEL
ALK PHOS: 64 U/L (ref 39–117)
ALT: 22 U/L (ref 0–35)
AST: 32 U/L (ref 0–37)
Albumin: 3.4 g/dL — ABNORMAL LOW (ref 3.5–5.2)
Anion gap: 15 (ref 5–15)
BUN: 43 mg/dL — ABNORMAL HIGH (ref 6–23)
CO2: 25 mEq/L (ref 19–32)
Calcium: 9.9 mg/dL (ref 8.4–10.5)
Chloride: 102 mEq/L (ref 96–112)
Creatinine, Ser: 1.22 mg/dL — ABNORMAL HIGH (ref 0.50–1.10)
GFR calc Af Amer: 42 mL/min — ABNORMAL LOW (ref >90)
GFR calc non Af Amer: 36 mL/min — ABNORMAL LOW (ref >90)
GLUCOSE: 120 mg/dL — AB (ref 70–99)
POTASSIUM: 3.6 meq/L — AB (ref 3.7–5.3)
SODIUM: 142 meq/L (ref 137–147)
TOTAL PROTEIN: 6.6 g/dL (ref 6.0–8.3)
Total Bilirubin: 0.6 mg/dL (ref 0.3–1.2)

## 2013-11-10 LAB — URINALYSIS, ROUTINE W REFLEX MICROSCOPIC
Bilirubin Urine: NEGATIVE
GLUCOSE, UA: NEGATIVE mg/dL
HGB URINE DIPSTICK: NEGATIVE
Ketones, ur: NEGATIVE mg/dL
Leukocytes, UA: NEGATIVE
Nitrite: NEGATIVE
PH: 7 (ref 5.0–8.0)
PROTEIN: NEGATIVE mg/dL
SPECIFIC GRAVITY, URINE: 1.012 (ref 1.005–1.030)
Urobilinogen, UA: 0.2 mg/dL (ref 0.0–1.0)

## 2013-11-10 LAB — CBC WITH DIFFERENTIAL/PLATELET
Basophils Absolute: 0 10*3/uL (ref 0.0–0.1)
Basophils Relative: 0 % (ref 0–1)
Eosinophils Absolute: 0 10*3/uL (ref 0.0–0.7)
Eosinophils Relative: 0 % (ref 0–5)
HCT: 32.9 % — ABNORMAL LOW (ref 36.0–46.0)
Hemoglobin: 10.9 g/dL — ABNORMAL LOW (ref 12.0–15.0)
LYMPHS ABS: 0.7 10*3/uL (ref 0.7–4.0)
Lymphocytes Relative: 6 % — ABNORMAL LOW (ref 12–46)
MCH: 31.5 pg (ref 26.0–34.0)
MCHC: 33.1 g/dL (ref 30.0–36.0)
MCV: 95.1 fL (ref 78.0–100.0)
Monocytes Absolute: 0.6 10*3/uL (ref 0.1–1.0)
Monocytes Relative: 5 % (ref 3–12)
NEUTROS PCT: 89 % — AB (ref 43–77)
Neutro Abs: 10.3 10*3/uL — ABNORMAL HIGH (ref 1.7–7.7)
Platelets: 154 10*3/uL (ref 150–400)
RBC: 3.46 MIL/uL — AB (ref 3.87–5.11)
RDW: 13.6 % (ref 11.5–15.5)
WBC: 11.6 10*3/uL — ABNORMAL HIGH (ref 4.0–10.5)

## 2013-11-10 LAB — TROPONIN I: Troponin I: <0.3 ng/mL (ref <0.30)

## 2013-11-10 MED ORDER — TETANUS-DIPHTH-ACELL PERTUSSIS 5-2.5-18.5 LF-MCG/0.5 IM SUSP
0.5000 mL | Freq: Once | INTRAMUSCULAR | Status: AC
  Administered 2013-11-10: 0.5 mL via INTRAMUSCULAR
  Filled 2013-11-10: qty 0.5

## 2013-11-10 NOTE — ED Notes (Signed)
Per Dr Lita Mains patient is to remain NPO at this time.

## 2013-11-10 NOTE — ED Notes (Signed)
Patient transported to CT 

## 2013-11-10 NOTE — ED Notes (Signed)
Pt presents via EMS from [facility]. Pt had an un-witnessed fall at the facility tonight. Pt denies any LOC, pt has a lac on the back of her head, bleeding controlled at this time. Pt's only other complaint is right leg pain, some problem bearing right on it for EMS. No shortening or rotation noted to that leg per EMS, denies neck or back pain. Hx of dementia.

## 2013-11-10 NOTE — ED Provider Notes (Signed)
CSN: [id number]     Arrival date & time 11/10/13  0235 History   First MD Initiated Contact with Patient 11/10/13 [phone]     Chief Complaint  Patient presents with  . Fall     (Consider location/radiation/quality/duration/timing/severity/associated sxs/prior Treatment) HPI Patient was in her normal state of health when she went to bed this evening. Around 2 AM she got up from bed and tripped on a rug. She fell to the ground struck her head and right knee. She had no loss of consciousness. She complains only of mild right leg pain. She denies any headache or neck pain. She's had no nausea or vomiting. She's had no visual changes. Eyes any recent fever or chills. She's had no coughing, shortness of breath, chest pain or abdominal pain. She denies urinary symptoms. Denies weakness or numbness.   Past Medical History  Diagnosis Date  . Arthritis   . Depression   . Cataract   . Thyroid disease   . Anxiety   . Osteoporosis   . Hypertension   . Atherosclerotic cerebrovascular disease   . Hyperlipidemia   . Pancreatitis   . Other sleep disturbances   . Backache, unspecified   . Hemorrhage of rectum and anus 03/26/2013  . Diseases of lips 03/26/2013  . Senile osteoporosis     with old T11 fracture  . Chronic kidney disease, unspecified   . Other malaise and fatigue   . Anorexia   . Insomnia, unspecified   . Other symptoms involving cardiovascular system   . Other specified cardiac dysrhythmias(427.89)   . Diaphragmatic hernia without mention of obstruction or gangrene   . Macular degeneration (senile) of retina, unspecified   . Personal history of other diseases of digestive system     pancreatitis from gallstones  . Cholelithiasis   . Unspecified disorders of arteries and arterioles   . Other specified personal history presenting hazards to health(V15.89)   . Personal history of other diseases of circulatory system   . Unspecified hemorrhoids without mention of complication   .  Unspecified hearing loss   . Anemia, unspecified   . Rosacea   . Personal history of traumatic fracture   . Personal history of other diseases of respiratory system   . Diverticulosis of colon (without mention of hemorrhage)    Past Surgical History  Procedure Laterality Date  . Abdominal hysterectomy      and BSO fibroids  . Joint replacement  1996  . Colonoscopy  05/23/2007  . Total hip arthroplasty Right    Family History  Problem Relation Age of Onset  . Heart disease Mother   . Diabetes Mother   . Heart disease Father   . Diabetes Sister   . Diabetes Brother   . Diabetes Daughter    History  Substance Use Topics  . Smoking status: Never Smoker   . Smokeless tobacco: Not on file  . Alcohol Use: No   OB History   Grav Para Term Preterm Abortions TAB SAB Ect Mult Living                 Review of Systems  Constitutional: Negative for fever and chills.  Eyes: Negative for visual disturbance.  Respiratory: Negative for cough and shortness of breath.   Cardiovascular: Negative for chest pain, palpitations and leg swelling.  Gastrointestinal: Negative for nausea, vomiting and abdominal pain.  Genitourinary: Negative for dysuria.  Musculoskeletal: Negative for back pain, myalgias, neck pain and neck stiffness.  Skin: Positive for  wound. Negative for rash.  Neurological: Negative for dizziness, syncope, weakness, light-headedness, numbness and headaches.  All other systems reviewed and are negative.     Allergies  Ace inhibitors; Mirtazapine; and Penicillins  Home Medications   Prior to Admission medications   Medication Sig Start Date End Date Taking? Authorizing Provider  acetaminophen (TYLENOL) 500 MG tablet Take 500 mg by mouth. Take one twice daily for pain   Yes Historical Provider, MD  amLODipine (NORVASC) 10 MG tablet Take 10 mg by mouth daily.   Yes Historical Provider, MD  Ascorbic Acid (VITAMIN C) 1000 MG tablet Take 1,000 mg by mouth daily. Take one  daily   Yes Historical Provider, MD  aspirin 81 MG tablet Take 81 mg by mouth daily.   Yes Historical Provider, MD  atorvastatin (LIPITOR) 10 MG tablet Take one tablet daily 09/15/13  Yes Historical Provider, MD  BYSTOLIC 10 MG tablet Take one tablet daily for blood pressusre 09/24/13  Yes Historical Provider, MD  Calcium Carbonate-Vitamin D (CALCIUM 600+D) 600-400 MG-UNIT per tablet Take 1 tablet by mouth 2 (two) times daily.    Yes Historical Provider, MD  cholecalciferol (VITAMIN D) 1000 UNITS tablet Take 1,000 Units by mouth daily.   Yes Historical Provider, MD  Coenzyme Q10 (CO Q-10) 200 MG CAPS Take 1 capsule by mouth daily. Take one daily   Yes Historical Provider, MD  ENSURE PLUS (ENSURE PLUS) LIQD Take 237 mLs by mouth 2 (two) times daily between meals.   Yes Historical Provider, MD  escitalopram (LEXAPRO) 10 MG tablet Take one tablet daily 09/27/13  Yes Historical Provider, MD  guaifenesin (HUMIBID E) 400 MG TABS tablet Take 400 mg by mouth. Every 4 hours as needed cough   Yes Historical Provider, MD  levothyroxine (SYNTHROID, LEVOTHROID) 25 MCG tablet Take 25 mcg by mouth daily.   Yes Historical Provider, MD  mirtazapine (REMERON) 30 MG tablet Take 30 mg by mouth at bedtime. One at bedtime 05/19/13  Yes Historical Provider, MD  Multiple Vitamins-Minerals (ICAPS PO) Take by mouth. Take one daily for eyes   Yes Historical Provider, MD  Omega-3 Fatty Acids (FISH OIL) 1000 MG CAPS Take by mouth. Take one tablet daily   Yes Historical Provider, MD  Polyethyl Glycol-Propyl Glycol (SYSTANE) 0.4-0.3 % SOLN Apply to eye. One drop in both eyes as needed for dry eyes   Yes Historical Provider, MD  RESTASIS 0.05 % ophthalmic emulsion One drop both eyes twice daily 04/03/13  Yes Historical Provider, MD  traZODone (DESYREL) 50 MG tablet Take 25 mg by mouth at bedtime as needed for sleep. Take 1/2 tablet at bedtime 05/10/13  Yes Historical Provider, MD  clindamycin (CLEOCIN) 150 MG capsule Take 150 mg by mouth.  4 caps one hour prior dental    Historical Provider, MD  ibuprofen (ADVIL,MOTRIN) 400 MG tablet Take 400 mg by mouth every 4 (four) hours as needed for moderate pain.     Historical Provider, MD  polyethylene glycol (MIRALAX / GLYCOLAX) packet Take 17 g by mouth every 3 (three) days.     Historical Provider, MD   BP 157/78  Pulse 55  Temp(Src) 97.8 F (36.6 C) (Oral)  Resp 15  SpO2 94% Physical Exam  Nursing note and vitals reviewed. Constitutional: She is oriented to person, place, and time. She appears well-developed and well-nourished. No distress.  HENT:  Head: Normocephalic.  Mouth/Throat: Oropharynx is clear and moist.  Small one centimeter laceration in the occipital region. There is no active  bleeding. There is soft tissue swelling but no bony deformity.  Eyes: EOM are normal. Pupils are equal, round, and reactive to light.  Neck: Normal range of motion. Neck supple.  No posterior midline cervical tenderness to palpation.  Cardiovascular: Normal rate and regular rhythm.   Pulmonary/Chest: Effort normal and breath sounds normal. No respiratory distress. She has no wheezes. She has no rales. She exhibits no tenderness.  Abdominal: Soft. Bowel sounds are normal. She exhibits no distension and no mass. There is no tenderness. There is no rebound and no guarding.  Musculoskeletal: Normal range of motion. She exhibits no edema and no tenderness.  Multilayers to palpation over the lateral right knee. There is no deformity. No ligamentous instability. Contusion noted in the same area. No joint swelling. Full range of motion of the right hip without pain. Distal pulses intact. Pelvis stable. No thoracic or lumbar tenderness to palpation  Neurological: She is alert and oriented to person, place, and time.  Patient is alert and oriented x3 with clear, goal oriented speech. Patient has 5/5 motor in all extremities. Sensation is intact to light touch.   Skin: Skin is warm and dry. No rash  noted. No erythema.  Psychiatric: She has a normal mood and affect. Her behavior is normal.    ED Course  Procedures (including critical care time) Labs Review Labs Reviewed - No data to display  Imaging Review No results found.   EKG Interpretation None      MDM   Final diagnoses:  None   Head laceration repaired in emergency department. Vital signs remained stable. Patient became lightheaded and unsteady when ambulating complaining of right hip pain. She appeared to become acutely confused. Discharge was canceled. Labs and urine are pending.  Signed out to oncoming emergency physician pending labs and repeat exam   Julianne Rice, MD 11/11/13 [phone]

## 2013-11-10 NOTE — ED Notes (Signed)
[facility] requested for patient transport

## 2013-11-10 NOTE — ED Notes (Signed)
Emergency Contact Loreli Slot [phone] Password "[email or web address]"

## 2013-11-10 NOTE — ED Provider Notes (Signed)
Patient was seen by Dr. Lita Mains this evening.  Patient is a resident of the nursing home. IShe was waiting for her lab results after an episode of confusion Physical Exam  BP 138/42  Pulse 61  Temp(Src) 97.5 F (36.4 C) (Oral)  Resp 16  SpO2 95%  Physical Exam  HENT:  Head: Normocephalic.  Eyes: Conjunctivae are normal. Pupils are equal, round, and reactive to light.  Pulmonary/Chest: Effort normal.  Abdominal: She exhibits no distension.  Neurological: She is alert. No cranial nerve deficit.  Speaking clearly, somewhat confused about what occurred last evening, she is describing some of the events in the emergency room this if they occurred back to her nursing facility  Skin: She is diaphoretic.    ED Course  Procedures  MDM I was asked to review her labs. They are stable.  Suspect some sun downing as the cause of her confusion. The patient's x-rays recently did show a nondisplaced right inferior pubic ramus fracture. Patient is at a nursing facility where she will have assistance. Pt is Stable for discharge      Dorie Rank, MD 11/10/13 [phone]

## 2013-11-10 NOTE — ED Notes (Signed)
PTAR at bedside 

## 2013-11-10 NOTE — ED Notes (Signed)
Report given to [facility] , spoke to [place]. [facility] called.

## 2013-11-10 NOTE — ED Notes (Signed)
Patient transported to X-ray 

## 2013-11-10 NOTE — ED Notes (Signed)
Bed: RESB Expected date:  Expected time:  Means of arrival:  Comments: EMS - 80 yo F fall, lac to head. Rt hip pain

## 2013-11-10 NOTE — Discharge Instructions (Signed)

## 2013-11-10 NOTE — ED Notes (Signed)
MD Knapp at bedside 

## 2013-11-10 NOTE — ED Provider Notes (Signed)
LACERATION REPAIR Performed by: Corine Shelter Authorized by: Corine Shelter Consent: Verbal consent obtained. Risks and benefits: risks, benefits and alternatives were discussed Consent given by: patient Patient identity confirmed: provided demographic data Prepped and Draped in normal sterile fashion Wound explored, no FB  Laceration Location: posterior scalp  Laceration Length: 8mm  No Foreign Bodies seen or palpated  Anesthesia: none  Irrigation method: syringe with saline Amount of cleaning: standard  Skin closure: skin stapler  Number of staples: 1  Technique: close approximation  Patient tolerance: Patient tolerated the procedure well with no immediate complications.   Camprubi-Soms, Patty Sermons, PA-C 5:54 AM   Patty Sermons [place], [place] 11/10/13 [phone]

## 2013-11-10 NOTE — ED Notes (Addendum)
Patient requested bathroom assistance. Patient was placed on steady. Patient was unable to hold her balance. Patient was assisted back to bed by nurse and tech. Patient became pale and had decreased alertness. Patient vital signs assessed. When patient became more alert she stated she "just didn't feel well". Dr Lita Mains notified, orders received and initiated. PTAR cancelled.

## 2013-11-10 NOTE — ED Notes (Signed)
Patient's chart  Opened after patient discharged due to [facility] a Syl[place] having questions about medications. This RN answered all questions she had and call back number was provided if additional questions arise.

## 2013-11-10 NOTE — ED Notes (Signed)
Awaiting PT[facility] transport. 

## 2013-11-11 NOTE — ED Provider Notes (Signed)
Medical screening examination/treatment/procedure(s) were performed by non-physician practitioner and as supervising physician I was immediately available for consultation/collaboration.   EKG Interpretation   Date/Time:  Saturday November 10 2013 06:50:18 EDT Ventricular Rate:  56 PR Interval:  180 QRS Duration: 140 QT Interval:  485 QTC Calculation: 468 R Axis:   -56 Text Interpretation:  Sinus rhythm RBBB and LAFB rbbb abd lafb are new  since last tracing Confirmed by KNAPP  MD-J, JON ([id number]) on 11/10/2013  7:38:14 AM        Julianne Rice, MD 11/11/13 [phone]

## 2013-11-12 ENCOUNTER — Non-Acute Institutional Stay (SKILLED_NURSING_FACILITY): Payer: Medicare Other | Admitting: Nurse Practitioner

## 2013-11-12 ENCOUNTER — Encounter: Payer: Self-pay | Admitting: Nurse Practitioner

## 2013-11-12 DIAGNOSIS — E039 Hypothyroidism, unspecified: Secondary | ICD-10-CM

## 2013-11-12 DIAGNOSIS — S0101XA Laceration without foreign body of scalp, initial encounter: Secondary | ICD-10-CM

## 2013-11-12 DIAGNOSIS — IMO0002 Reserved for concepts with insufficient information to code with codable children: Secondary | ICD-10-CM

## 2013-11-12 DIAGNOSIS — S0101XS Laceration without foreign body of scalp, sequela: Secondary | ICD-10-CM

## 2013-11-12 DIAGNOSIS — Z8719 Personal history of other diseases of the digestive system: Secondary | ICD-10-CM

## 2013-11-12 DIAGNOSIS — R350 Frequency of micturition: Secondary | ICD-10-CM

## 2013-11-12 DIAGNOSIS — S32591A Other specified fracture of right pubis, initial encounter for closed fracture: Secondary | ICD-10-CM | POA: Insufficient documentation

## 2013-11-12 DIAGNOSIS — K5900 Constipation, unspecified: Secondary | ICD-10-CM

## 2013-11-12 DIAGNOSIS — N182 Chronic kidney disease, stage 2 (mild): Secondary | ICD-10-CM

## 2013-11-12 DIAGNOSIS — I10 Essential (primary) hypertension: Secondary | ICD-10-CM

## 2013-11-12 DIAGNOSIS — G4700 Insomnia, unspecified: Secondary | ICD-10-CM

## 2013-11-12 DIAGNOSIS — F418 Other specified anxiety disorders: Secondary | ICD-10-CM

## 2013-11-12 DIAGNOSIS — D638 Anemia in other chronic diseases classified elsewhere: Secondary | ICD-10-CM

## 2013-11-12 DIAGNOSIS — F341 Dysthymic disorder: Secondary | ICD-10-CM

## 2013-11-12 DIAGNOSIS — S32591S Other specified fracture of right pubis, sequela: Secondary | ICD-10-CM

## 2013-11-12 HISTORY — DX: Other specified fracture of right pubis, initial encounter for closed fracture: S32.591A

## 2013-11-12 HISTORY — DX: Laceration without foreign body of scalp, initial encounter: S01.01XA

## 2013-11-12 NOTE — Assessment & Plan Note (Signed)
Hx. Asymptomatic presently.

## 2013-11-12 NOTE — Assessment & Plan Note (Signed)
Continue Levothyroxine 25mcg daily. TSH 1.76 05/25/13, 2.124 10/26/13   

## 2013-11-12 NOTE — Assessment & Plan Note (Signed)
Crys at night, cannot return to sleep after bathroom trips(2-3x/night), stated she hates this place([facility]). She believes her son in law takes advantage of her money. Failed Zoloft, Mirtazapine, and Trazodone. Better Lexapro to stabilize her mood.

## 2013-11-12 NOTE — Assessment & Plan Note (Signed)
11/10/13 s/p fall, ED eval  X-ray R hip  1. Possible nondisplaced right inferior pubic ramus fracture.  2. No femur fracture or dislocation 11/12/13 admitted to SNF-no longer self care sufficiency in AL setting. C/o R leg pain with weight bearing. Adding Norco 5/325mg  q6h prn for pain. May consider Ortho and MRI if no better

## 2013-11-12 NOTE — Assessment & Plan Note (Signed)
The right occipital area with one staple closure-intact and no s/s of infection-removed in 7-10 days. Sustained from fall 11/10/13. The patient has no recollection of the event.

## 2013-11-12 NOTE — Progress Notes (Signed)
Patient ID: Patricia Nelson, female   DOB: 10-02-1915, 80 y.o.   MRN: [id number]   Code Status: DNR. POA  Allergies  Allergen Reactions  . Ace Inhibitors     cough  . Mirtazapine     Nightmares   . Penicillins Rash    Chief Complaint  Patient presents with  . Medical Management of Chronic Issues  . Acute Visit    s/p fall, ED eval, right leg pain.     HPI: Patient is a 80 y.o. female seen in the SNF at [facility] today for management of s/p fall, R leg pain, occipital scalp laceration and chronic medical conditions   ED eval: 11/10/13 sustained occipital laceration from fall at [facility]. One staple closure noted and intact @ left occipital area. CT head and cervical spine unremarkable. C/o R leg pain worsens with weight bearing and no longer ambulating as prior since the fall. X-ray 1. Possible nondisplaced right inferior pubic ramus fracture.2. No femur fracture or dislocation    11/12/13 the patient was admitted to SNF for care needs since she is no longer self sufficiency in AL setting.   Problem List Items Addressed This Visit   Urinary frequency     Chronic. Has been evaluated by Dr. Amalia Hailey. Wear dependents for urinary leakage.        Unspecified hypothyroidism     Continue Levothyroxine 28mcg daily. TSH 1.76 05/25/13, 2.124 10/26/13    Unspecified constipation     Stable, taking MiraLax daily.      Laceration of occipital scalp     The right occipital area with one staple closure-intact and no s/s of infection-removed in 7-10 days. Sustained from fall 11/10/13. The patient has no recollection of the event.     Insomnia     Sleeps much better since Lexapro 10mg      HTN (hypertension)     Continue Novasc 42m and Bystolic 10mg  daily for Bp. Controlled. Off Diovan/HCT     H/O acute pancreatitis     Hx. Asymptomatic presently.      Depression with anxiety     Crys at night, cannot return to sleep after bathroom trips(2-3x/night), stated she hates this  place([facility]). She believes her son in law takes advantage of her money. Failed Zoloft, Mirtazapine, and Trazodone. Better Lexapro to stabilize her mood.       Closed fracture of right inferior pubic ramus - Primary     11/10/13 s/p fall, ED eval  X-ray R hip  1. Possible nondisplaced right inferior pubic ramus fracture.  2. No femur fracture or dislocation 11/12/13 admitted to SNF-no longer self care sufficiency in AL setting. C/o R leg pain with weight bearing. Adding Norco 5/325mg  q6h prn for pain. May consider Ortho and MRI if no better        Chronic renal disease     07/03/13 Bun/creat 31/1.17. Bun/creat 57/1.71 05/25/13. off Diovan/HCT.  10/09/13 Bun/creat 37/1.2       Anemia of chronic disease     07/03/13 Hgb 8.9 07/03/13. Normal Iron 52, B12 641, Folate 15.5  07/26/13 Hgb 9.5 10/09/13 Hgb 9.3       Review of Systems:  Review of Systems  Constitutional: Positive for weight loss. Negative for fever, chills, malaise/fatigue and diaphoresis.       #6-7 Ibs in the past year  HENT: Positive for hearing loss. Negative for congestion, ear discharge, ear pain, nosebleeds, sore throat and tinnitus.   Eyes: Negative for blurred vision,  double vision, photophobia, pain, discharge and redness.  Respiratory: Negative for cough, hemoptysis, sputum production, shortness of breath, wheezing and stridor.   Cardiovascular: Negative for chest pain, palpitations, orthopnea, claudication, leg swelling and PND.  Gastrointestinal: Negative for heartburn, nausea, vomiting, abdominal pain, diarrhea, constipation, blood in stool and melena.  Genitourinary: Positive for frequency. Negative for dysuria, urgency, hematuria and flank pain.       Better at night.   Musculoskeletal: Positive for joint pain. Negative for back pain, falls, myalgias and neck pain.       No longer ambulates with walker since fall 11/10/13. C/o R leg pain with weight bearing.   Skin: Negative for itching and rash.       The right  occipital scalp laceration with staple closure-intact and no s/s of infection.   Neurological: Negative for dizziness, tingling, tremors, sensory change, speech change, focal weakness, seizures, loss of consciousness, weakness and headaches.  Endo/Heme/Allergies: Negative for environmental allergies and polydipsia. Does not bruise/bleed easily.  Psychiatric/Behavioral: Positive for depression and memory loss. Negative for suicidal ideas, hallucinations and substance abuse. The patient is nervous/anxious and has insomnia.        Sleeps and mood are improved on Lexapro     Past Medical History  Diagnosis Date  . Arthritis   . Depression   . Cataract   . Thyroid disease   . Anxiety   . Osteoporosis   . Hypertension   . Atherosclerotic cerebrovascular disease   . Hyperlipidemia   . Pancreatitis   . Other sleep disturbances   . Backache, unspecified   . Hemorrhage of rectum and anus 03/26/2013  . Diseases of lips 03/26/2013  . Senile osteoporosis     with old T11 fracture  . Chronic kidney disease, unspecified   . Other malaise and fatigue   . Anorexia   . Insomnia, unspecified   . Other symptoms involving cardiovascular system   . Other specified cardiac dysrhythmias(427.89)   . Diaphragmatic hernia without mention of obstruction or gangrene   . Macular degeneration (senile) of retina, unspecified   . Personal history of other diseases of digestive system     pancreatitis from gallstones  . Cholelithiasis   . Unspecified disorders of arteries and arterioles   . Other specified personal history presenting hazards to health(V15.89)   . Personal history of other diseases of circulatory system   . Unspecified hemorrhoids without mention of complication   . Unspecified hearing loss   . Anemia, unspecified   . Rosacea   . Personal history of traumatic fracture   . Personal history of other diseases of respiratory system   . Diverticulosis of colon (without mention of hemorrhage)     Past Surgical History  Procedure Laterality Date  . Abdominal hysterectomy      and BSO fibroids  . Joint replacement  1996  . Colonoscopy  05/23/2007  . Total hip arthroplasty Right    Social History:   reports that she has never smoked. She does not have any smokeless tobacco history on file. She reports that she does not drink alcohol or use illicit drugs.  Family History  Problem Relation Age of Onset  . Heart disease Mother   . Diabetes Mother   . Heart disease Father   . Diabetes Sister   . Diabetes Brother   . Diabetes Daughter     Medications: Patient's Medications  New Prescriptions   No medications on file  Previous Medications   ACETAMINOPHEN (TYLENOL) 500 MG  TABLET    Take 500 mg by mouth. Take one twice daily for pain   AMLODIPINE (NORVASC) 10 MG TABLET    Take 10 mg by mouth daily.   ASCORBIC ACID (VITAMIN C) 1000 MG TABLET    Take 1,000 mg by mouth daily. Take one daily   ASPIRIN 81 MG TABLET    Take 81 mg by mouth daily.   ATORVASTATIN (LIPITOR) 10 MG TABLET    Take one tablet daily   BYSTOLIC 10 MG TABLET    Take one tablet daily for blood pressusre   CALCIUM CARBONATE-VITAMIN D (CALCIUM 600+D) 600-400 MG-UNIT PER TABLET    Take 1 tablet by mouth 2 (two) times daily.    CHOLECALCIFEROL (VITAMIN D) 1000 UNITS TABLET    Take 1,000 Units by mouth daily.   CLINDAMYCIN (CLEOCIN) 150 MG CAPSULE    Take 150 mg by mouth. 4 caps one hour prior dental   COENZYME Q10 (CO Q-10) 200 MG CAPS    Take 1 capsule by mouth daily. Take one daily   ENSURE PLUS (ENSURE PLUS) LIQD    Take 237 mLs by mouth 2 (two) times daily between meals.   ESCITALOPRAM (LEXAPRO) 10 MG TABLET    Take one tablet daily   GUAIFENESIN (HUMIBID E) 400 MG TABS TABLET    Take 400 mg by mouth. Every 4 hours as needed cough   IBUPROFEN (ADVIL,MOTRIN) 400 MG TABLET    Take 400 mg by mouth every 4 (four) hours as needed for moderate pain.    LEVOTHYROXINE (SYNTHROID, LEVOTHROID) 25 MCG TABLET    Take 25  mcg by mouth daily.   MIRTAZAPINE (REMERON) 30 MG TABLET    Take 30 mg by mouth at bedtime. One at bedtime   MULTIPLE VITAMINS-MINERALS (ICAPS PO)    Take by mouth. Take one daily for eyes   OMEGA-3 FATTY ACIDS (FISH OIL) 1000 MG CAPS    Take by mouth. Take one tablet daily   POLYETHYL GLYCOL-PROPYL GLYCOL (SYSTANE) 0.4-0.3 % SOLN    Apply to eye. One drop in both eyes as needed for dry eyes   POLYETHYLENE GLYCOL (MIRALAX / GLYCOLAX) PACKET    Take 17 g by mouth every 3 (three) days.    RESTASIS 0.05 % OPHTHALMIC EMULSION    One drop both eyes twice daily   TRAZODONE (DESYREL) 50 MG TABLET    Take 25 mg by mouth at bedtime as needed for sleep. Take 1/2 tablet at bedtime  Modified Medications   No medications on file  Discontinued Medications   No medications on file     Physical Exam: Physical Exam  Constitutional: She is oriented to person, place, and time. She appears well-developed and well-nourished. No distress.  HENT:  Head: Normocephalic and atraumatic.  Right Ear: External ear normal.  Left Ear: External ear normal.  Nose: Nose normal.  Mouth/Throat: Oropharynx is clear and moist. No oropharyngeal exudate.  Eyes: Conjunctivae and EOM are normal. Pupils are equal, round, and reactive to light. Right eye exhibits no discharge. Left eye exhibits no discharge. No scleral icterus.  Neck: Normal range of motion. Neck supple. No JVD present. No tracheal deviation present. No thyromegaly present.  Cardiovascular: Normal rate, regular rhythm, normal heart sounds and intact distal pulses.  Exam reveals no gallop and no friction rub.   No murmur heard. Pulmonary/Chest: Effort normal and breath sounds normal. No stridor. No respiratory distress. She has no wheezes. She has no rales. She exhibits no tenderness.  Abdominal: Soft. Bowel sounds are normal. She exhibits no distension and no mass. There is no tenderness. There is no rebound and no guarding.  2 small hemorrhoids at 11 am.    Genitourinary: Guaiac negative stool.  Musculoskeletal: Normal range of motion. She exhibits tenderness. She exhibits no edema.  R leg pain in groin and upper leg with weight bearing since fall 11/10/13   Lymphadenopathy:    She has no cervical adenopathy.  Neurological: She is alert and oriented to person, place, and time. She has normal reflexes. No cranial nerve deficit. She exhibits abnormal muscle tone. Coordination normal.  Skin: Skin is warm. No rash noted. She is not diaphoretic. No erythema. No pallor.  The right occipital scalp laceration with staple closure-intact and no s/s of infection.    Psychiatric: Her mood appears anxious. Her affect is angry. Her affect is not blunt, not labile and not inappropriate. Her speech is not delayed, not tangential and not slurred. She is not agitated, not aggressive, not hyperactive, not slowed, not withdrawn, not actively hallucinating and not combative. Thought content is paranoid. Thought content is not delusional. Cognition and memory are impaired. She does not express impulsivity or inappropriate judgment. She exhibits a depressed mood. She expresses no homicidal and no suicidal ideation. She expresses no suicidal plans and no homicidal plans. She is communicative. She exhibits abnormal recent memory and abnormal remote memory.  Improved.  She is attentive.    Filed Vitals:   11/12/13 1557  BP: 140/62  Pulse: 58  Temp: 97.5 F (36.4 C)  TempSrc: Tympanic  Resp: 18      Labs reviewed: Basic Metabolic Panel:  Recent Labs  05/25/13 07/26/13 10/09/13 10/26/13 11/10/13 0701  NA 141 142 139  --  142  K 4.4 4.0 4.0  --  3.6*  CL  --   --   --   --  102  CO2  --   --   --   --  25  GLUCOSE  --   --   --   --  120*  BUN 57* 31* 37*  --  43*  CREATININE 1.7* 1.2* 1.2*  --  1.22*  CALCIUM  --   --   --   --  9.9  TSH 1.76  --   --  2.12  --    CBC:  Recent Labs  07/26/13 10/09/13 11/10/13 0701  WBC 4.0 3.9 11.6*  NEUTROABS  --    --  10.3*  HGB 9.5* 9.3* 10.9*  HCT 28* 28* 32.9*  MCV  --   --  95.1  PLT 208 179 154    Past Procedures:  10/24/12 EKG: sinus rhythm with marked sinus arrhythmia.   05/16/13 X-ray thoracic spine: mold compression fractures no evidence of a recent fracture. However, a subtle increase in an old fracture may be inapparent radio graphically.   11/10/13 X-ray R hip:  IMPRESSION: 1. Possible nondisplaced right inferior pubic ramus fracture. 2. No femur fracture or dislocation   11/10/13 CT head and cervical spine:  IMPRESSION: 1. No evidence of traumatic intracranial injury or fracture. 2. No evidence of fracture or subluxation along the cervical spine. 3. Soft tissue swelling at the right vertex. 4. Moderate cortical volume loss and scattered small vessel ischemic microangiopathy. 5. Mild degenerative change noted along the cervical spine. 6. Dense calcification and scarring at the lung apices. 7. Dense calcification noted at the left carotid bifurcation. Carotid ultrasound would be helpful for further evaluation,  when and as deemed clinically appropriate.       Assessment/Plan Closed fracture of right inferior pubic ramus 11/10/13 s/p fall, ED eval  X-ray R hip  1. Possible nondisplaced right inferior pubic ramus fracture.  2. No femur fracture or dislocation 11/12/13 admitted to SNF-no longer self care sufficiency in [place] setting. C/o R leg pain with weight bearing. Adding Norco 5/325mg  q6h prn for pain. May consider Ortho and MRI if no better      Anemia of chronic disease 07/03/13 Hgb 8.9 07/03/13. Normal Iron 52, B12 641, Folate 15.5  07/26/13 Hgb 9.5 10/09/13 Hgb 9.3  Chronic renal disease 07/03/13 Bun/creat 31/1.17. Bun/creat 57/1.71 05/25/13. off Diovan/HCT.  10/09/13 Bun/creat 37/1.2     Depression with anxiety Crys at night, cannot return to sleep after bathroom trips(2-3x/night), stated she hates this place([facility]). She believes her son in law takes advantage of her  money. Failed Zoloft, Mirtazapine, and Trazodone. Better Lexapro to stabilize her mood.     H/O acute pancreatitis Hx. Asymptomatic presently.    HTN (hypertension) Continue Novasc 57m and Bystolic 10mg  daily for Bp. Controlled. Off Diovan/HCT   Insomnia Sleeps much better since Lexapro 10mg    Unspecified constipation Stable, taking MiraLax daily.    Unspecified hypothyroidism Continue Levothyroxine 67mcg daily. TSH 1.76 05/25/13, 2.124 10/26/13  Urinary frequency Chronic. Has been evaluated by Dr. Amalia Hailey. Wear dependents for urinary leakage.      Laceration of occipital scalp The right occipital area with one staple closure-intact and no s/s of infection-removed in 7-10 days. Sustained from fall 11/10/13. The patient has no recollection of the event.     Family/ Staff Communication: observe the patient.   Goals of Care: AL  Labs/tests ordered: none

## 2013-11-12 NOTE — Assessment & Plan Note (Signed)
07/03/13 Hgb 8.9 07/03/13. Normal Iron 52, B12 641, Folate 15.5  07/26/13 Hgb 9.5 10/09/13 Hgb 9.3

## 2013-11-12 NOTE — Assessment & Plan Note (Signed)
Stable, taking MiraLax daily.

## 2013-11-12 NOTE — Assessment & Plan Note (Signed)
Continue Novasc 62m and Bystolic 10mg  daily for Bp. Controlled. Off Diovan/HCT

## 2013-11-12 NOTE — Assessment & Plan Note (Signed)
Sleeps much better since Lexapro 10mg 

## 2013-11-12 NOTE — Assessment & Plan Note (Signed)
07/03/13 Bun/creat 31/1.17. Bun/creat 57/1.71 05/25/13. off Diovan/HCT.  10/09/13 Bun/creat 37/1.2 

## 2013-11-12 NOTE — Assessment & Plan Note (Signed)
Chronic. Has been evaluated by Dr. Evans. Wear dependents for urinary leakage.    

## 2013-12-20 ENCOUNTER — Encounter: Payer: Self-pay | Admitting: Nurse Practitioner

## 2013-12-20 ENCOUNTER — Non-Acute Institutional Stay (SKILLED_NURSING_FACILITY): Payer: Medicare Other | Admitting: Nurse Practitioner

## 2013-12-20 DIAGNOSIS — S32591S Other specified fracture of right pubis, sequela: Secondary | ICD-10-CM

## 2013-12-20 DIAGNOSIS — N182 Chronic kidney disease, stage 2 (mild): Secondary | ICD-10-CM

## 2013-12-20 DIAGNOSIS — F0390 Unspecified dementia without behavioral disturbance: Secondary | ICD-10-CM

## 2013-12-20 DIAGNOSIS — D638 Anemia in other chronic diseases classified elsewhere: Secondary | ICD-10-CM

## 2013-12-20 DIAGNOSIS — I10 Essential (primary) hypertension: Secondary | ICD-10-CM

## 2013-12-20 DIAGNOSIS — E039 Hypothyroidism, unspecified: Secondary | ICD-10-CM

## 2013-12-20 DIAGNOSIS — R350 Frequency of micturition: Secondary | ICD-10-CM

## 2013-12-20 DIAGNOSIS — F418 Other specified anxiety disorders: Secondary | ICD-10-CM

## 2013-12-20 NOTE — Assessment & Plan Note (Signed)
Crys at night, cannot return to sleep after bathroom trips(2-3x/night), stated she hates this place([facility]). She believes her son in law takes advantage of her money. Failed Zoloft, Mirtazapine, and Trazodone. Better Lexapro to stabilize her mood.  12/20/13 SNF [facility] stable.

## 2013-12-20 NOTE — Progress Notes (Signed)
Patient ID: Patricia Nelson, female   DOB: 05/22/1915, 80 y.o.   MRN: [id number]   Code Status: DNR. POA  Allergies  Allergen Reactions  . Ace Inhibitors     cough  . Mirtazapine     Nightmares   . Penicillins Rash    Chief Complaint  Patient presents with  . Medical Management of Chronic Issues    HPI: Patient is a 80 y.o. female seen in the SNF at [facility] today for management of  chronic medical conditions   ED eval: 11/10/13 sustained occipital laceration from fall at AL [facility]. One staple closure noted and intact @ left occipital area. CT head and cervical spine unremarkable. C/o R leg pain worsens with weight bearing and no longer ambulating as prior since the fall. X-ray 1. Possible nondisplaced right inferior pubic ramus fracture.2. No femur fracture or dislocation    11/12/13 the patient was admitted to SNF for care needs since she is no longer self sufficiency in AL setting.   Problem List Items Addressed This Visit   Urinary frequency - Primary     Chronic. Has been evaluated by Dr. Amalia Hailey. Wear dependents for urinary leakage.        Senile dementia     MMSE 18/30 05/2013 per Dtr. May consider Namenda. Update MMSE     Hypothyroidism     Continue Levothyroxine 44mcg daily. TSH 1.76 05/25/13, 2.124 10/26/13     HTN (hypertension)     Continue Novasc 39m and Bystolic 10mg  daily for Bp. Controlled. Off Diovan/HCT      Depression with anxiety     Crys at night, cannot return to sleep after bathroom trips(2-3x/night), stated she hates this place([facility]). She believes her son in law takes advantage of her money. Failed Zoloft, Mirtazapine, and Trazodone. Better Lexapro to stabilize her mood.  12/20/13 SNF [facility] stable.        Closed fracture of right inferior pubic ramus     11/10/13 s/p fall, ED eval  X-ray R hip  1. Possible nondisplaced right inferior pubic ramus fracture.  2. No femur fracture or dislocation 11/12/13 admitted to SNF-no longer self care  sufficiency in AL setting. C/o R leg pain with weight bearing. Adding Norco 5/325mg  q6h prn for pain. May consider Ortho and MRI if no better   12/20/13 pain is much improved the right hip/groin/thigh area.        Chronic renal disease     07/03/13 Bun/creat 31/1.17. Bun/creat 57/1.71 05/25/13. off Diovan/HCT.  10/09/13 Bun/creat 37/1.2       Anemia of chronic disease     07/03/13 Hgb 8.9 07/03/13. Normal Iron 52, B12 641, Folate 15.5  07/26/13 Hgb 9.5 10/09/13 Hgb 9.3        Review of Systems:  Review of Systems  Constitutional: Positive for weight loss. Negative for fever, chills, malaise/fatigue and diaphoresis.       #6-7 Ibs in the past year  HENT: Positive for hearing loss. Negative for congestion, ear discharge, ear pain, nosebleeds, sore throat and tinnitus.   Eyes: Negative for blurred vision, double vision, photophobia, pain, discharge and redness.  Respiratory: Negative for cough, hemoptysis, sputum production, shortness of breath, wheezing and stridor.   Cardiovascular: Negative for chest pain, palpitations, orthopnea, claudication, leg swelling and PND.  Gastrointestinal: Negative for heartburn, nausea, vomiting, abdominal pain, diarrhea, constipation, blood in stool and melena.  Genitourinary: Positive for frequency. Negative for dysuria, urgency, hematuria and flank pain.  Better at night.   Musculoskeletal: Positive for joint pain. Negative for back pain, falls, myalgias and neck pain.       No longer ambulates with walker since fall 11/10/13. C/o R leg pain with weight bearing-much improved.   Skin: Negative for itching and rash.       The right occipital scalp laceration with staple closure-removed and healed.   Neurological: Negative for dizziness, tingling, tremors, sensory change, speech change, focal weakness, seizures, loss of consciousness, weakness and headaches.  Endo/Heme/Allergies: Negative for environmental allergies and polydipsia. Does not bruise/bleed  easily.  Psychiatric/Behavioral: Positive for depression and memory loss. Negative for suicidal ideas, hallucinations and substance abuse. The patient is nervous/anxious and has insomnia.        Sleeps and mood are improved on Lexapro     Past Medical History  Diagnosis Date  . Arthritis   . Depression   . Cataract   . Thyroid disease   . Anxiety   . Osteoporosis   . Hypertension   . Atherosclerotic cerebrovascular disease   . Hyperlipidemia   . Pancreatitis   . Other sleep disturbances   . Backache, unspecified   . Hemorrhage of rectum and anus 03/26/2013  . Diseases of lips 03/26/2013  . Senile osteoporosis     with old T11 fracture  . Chronic kidney disease, unspecified   . Other malaise and fatigue   . Anorexia   . Insomnia, unspecified   . Other symptoms involving cardiovascular system   . Other specified cardiac dysrhythmias(427.89)   . Diaphragmatic hernia without mention of obstruction or gangrene   . Macular degeneration (senile) of retina, unspecified   . Personal history of other diseases of digestive system     pancreatitis from gallstones  . Cholelithiasis   . Unspecified disorders of arteries and arterioles   . Other specified personal history presenting hazards to health(V15.89)   . Personal history of other diseases of circulatory system   . Unspecified hemorrhoids without mention of complication   . Unspecified hearing loss   . Anemia, unspecified   . Rosacea   . Personal history of traumatic fracture   . Personal history of other diseases of respiratory system   . Diverticulosis of colon (without mention of hemorrhage)    Past Surgical History  Procedure Laterality Date  . Abdominal hysterectomy      and BSO fibroids  . Joint replacement  1996  . Colonoscopy  05/23/2007  . Total hip arthroplasty Right    Social History:   reports that she has never smoked. She does not have any smokeless tobacco history on file. She reports that she does not drink  alcohol or use illicit drugs.  Family History  Problem Relation Age of Onset  . Heart disease Mother   . Diabetes Mother   . Heart disease Father   . Diabetes Sister   . Diabetes Brother   . Diabetes Daughter     Medications: Patient's Medications  New Prescriptions   No medications on file  Previous Medications   ACETAMINOPHEN (TYLENOL) 500 MG TABLET    Take 500 mg by mouth. Take one twice daily for pain   AMLODIPINE (NORVASC) 10 MG TABLET    Take 10 mg by mouth daily.   ASCORBIC ACID (VITAMIN C) 1000 MG TABLET    Take 1,000 mg by mouth daily. Take one daily   ASPIRIN 81 MG TABLET    Take 81 mg by mouth daily.   ATORVASTATIN (LIPITOR) 10  MG TABLET    Take one tablet daily   BYSTOLIC 10 MG TABLET    Take one tablet daily for blood pressusre   CALCIUM CARBONATE-VITAMIN D (CALCIUM 600+D) 600-400 MG-UNIT PER TABLET    Take 1 tablet by mouth 2 (two) times daily.    CHOLECALCIFEROL (VITAMIN D) 1000 UNITS TABLET    Take 1,000 Units by mouth daily.   CLINDAMYCIN (CLEOCIN) 150 MG CAPSULE    Take 150 mg by mouth. 4 caps one hour prior dental   COENZYME Q10 (CO Q-10) 200 MG CAPS    Take 1 capsule by mouth daily. Take one daily   ENSURE PLUS (ENSURE PLUS) LIQD    Take 237 mLs by mouth 2 (two) times daily between meals.   ESCITALOPRAM (LEXAPRO) 10 MG TABLET    Take one tablet daily   GUAIFENESIN (HUMIBID E) 400 MG TABS TABLET    Take 400 mg by mouth. Every 4 hours as needed cough   IBUPROFEN (ADVIL,MOTRIN) 400 MG TABLET    Take 400 mg by mouth every 4 (four) hours as needed for moderate pain.    LEVOTHYROXINE (SYNTHROID, LEVOTHROID) 25 MCG TABLET    Take 25 mcg by mouth daily.   MIRTAZAPINE (REMERON) 30 MG TABLET    Take 30 mg by mouth at bedtime. One at bedtime   MULTIPLE VITAMINS-MINERALS (ICAPS PO)    Take by mouth. Take one daily for eyes   OMEGA-3 FATTY ACIDS (FISH OIL) 1000 MG CAPS    Take by mouth. Take one tablet daily   POLYETHYL GLYCOL-PROPYL GLYCOL (SYSTANE) 0.4-0.3 % SOLN    Apply  to eye. One drop in both eyes as needed for dry eyes   POLYETHYLENE GLYCOL (MIRALAX / GLYCOLAX) PACKET    Take 17 g by mouth every 3 (three) days.    RESTASIS 0.05 % OPHTHALMIC EMULSION    One drop both eyes twice daily   TRAZODONE (DESYREL) 50 MG TABLET    Take 25 mg by mouth at bedtime as needed for sleep. Take 1/2 tablet at bedtime  Modified Medications   No medications on file  Discontinued Medications   No medications on file     Physical Exam: Physical Exam  Constitutional: She is oriented to person, place, and time. She appears well-developed and well-nourished. No distress.  HENT:  Head: Normocephalic and atraumatic.  Right Ear: External ear normal.  Left Ear: External ear normal.  Nose: Nose normal.  Mouth/Throat: Oropharynx is clear and moist. No oropharyngeal exudate.  Eyes: Conjunctivae and EOM are normal. Pupils are equal, round, and reactive to light. Right eye exhibits no discharge. Left eye exhibits no discharge. No scleral icterus.  Neck: Normal range of motion. Neck supple. No JVD present. No tracheal deviation present. No thyromegaly present.  Cardiovascular: Normal rate, regular rhythm and intact distal pulses.  Exam reveals no gallop and no friction rub.   Murmur heard. 0-8/6 systolic murmur  Pulmonary/Chest: Effort normal and breath sounds normal. No stridor. No respiratory distress. She has no wheezes. She has no rales. She exhibits no tenderness.  Abdominal: Soft. Bowel sounds are normal. She exhibits no distension and no mass. There is no tenderness. There is no rebound and no guarding.  2 small hemorrhoids at 11 am.   Genitourinary: Guaiac negative stool.  Musculoskeletal: Normal range of motion. She exhibits tenderness. She exhibits no edema.  R leg pain in groin and upper leg with weight bearing since fall 11/10/13-much improved.   Lymphadenopathy:  She has no cervical adenopathy.  Neurological: She is alert and oriented to person, place, and time. She  has normal reflexes. No cranial nerve deficit. She exhibits abnormal muscle tone. Coordination normal.  Skin: Skin is warm. No rash noted. She is not diaphoretic. No erythema. No pallor.  The right occipital scalp laceration with staple closure-removed and healed.    Psychiatric: Her mood appears anxious. Her affect is angry. Her affect is not blunt, not labile and not inappropriate. Her speech is not delayed, not tangential and not slurred. She is not agitated, not aggressive, not hyperactive, not slowed, not withdrawn, not actively hallucinating and not combative. Thought content is paranoid. Thought content is not delusional. Cognition and memory are impaired. She does not express impulsivity or inappropriate judgment. She exhibits a depressed mood. She expresses no homicidal and no suicidal ideation. She expresses no suicidal plans and no homicidal plans. She is communicative. She exhibits abnormal recent memory and abnormal remote memory.  Improved.  She is attentive.    Filed Vitals:   12/20/13 1601  BP: 116/60  Pulse: 60  Temp: 97.6 F (36.4 C)  TempSrc: Tympanic  Resp: 14      Labs reviewed: Basic Metabolic Panel:  Recent Labs  05/25/13 07/26/13 10/09/13 10/26/13 11/10/13 0701  NA 141 142 139  --  142  K 4.4 4.0 4.0  --  3.6*  CL  --   --   --   --  102  CO2  --   --   --   --  25  GLUCOSE  --   --   --   --  120*  BUN 57* 31* 37*  --  43*  CREATININE 1.7* 1.2* 1.2*  --  1.22*  CALCIUM  --   --   --   --  9.9  TSH 1.76  --   --  2.12  --    CBC:  Recent Labs  07/26/13 10/09/13 11/10/13 0701  WBC 4.0 3.9 11.6*  NEUTROABS  --   --  10.3*  HGB 9.5* 9.3* 10.9*  HCT 28* 28* 32.9*  MCV  --   --  95.1  PLT 208 179 154    Past Procedures:  10/24/12 EKG: sinus rhythm with marked sinus arrhythmia.   05/16/13 X-ray thoracic spine: mold compression fractures no evidence of a recent fracture. However, a subtle increase in an old fracture may be inapparent radio  graphically.   11/10/13 X-ray R hip:  IMPRESSION: 1. Possible nondisplaced right inferior pubic ramus fracture. 2. No femur fracture or dislocation   11/10/13 CT head and cervical spine:  IMPRESSION: 1. No evidence of traumatic intracranial injury or fracture. 2. No evidence of fracture or subluxation along the cervical spine. 3. Soft tissue swelling at the right vertex. 4. Moderate cortical volume loss and scattered small vessel ischemic microangiopathy. 5. Mild degenerative change noted along the cervical spine. 6. Dense calcification and scarring at the lung apices. 7. Dense calcification noted at the left carotid bifurcation. Carotid ultrasound would be helpful for further evaluation, when and as deemed clinically appropriate.       Assessment/Plan Urinary frequency Chronic. Has been evaluated by Dr. Amalia Hailey. Wear dependents for urinary leakage.      Hypothyroidism Continue Levothyroxine 15mcg daily. TSH 1.76 05/25/13, 2.124 10/26/13   HTN (hypertension) Continue Novasc 38m and Bystolic 10mg  daily for Bp. Controlled. Off Diovan/HCT    Depression with anxiety Crys at night, cannot return to sleep after bathroom trips(2-3x/night),  stated she hates this place([facility]). She believes her son in law takes advantage of her money. Failed Zoloft, Mirtazapine, and Trazodone. Better Lexapro to stabilize her mood.  12/20/13 SNF [facility] stable.      Closed fracture of right inferior pubic ramus 11/10/13 s/p fall, ED eval  X-ray R hip  1. Possible nondisplaced right inferior pubic ramus fracture.  2. No femur fracture or dislocation 11/12/13 admitted to SNF-no longer self care sufficiency in AL setting. C/o R leg pain with weight bearing. Adding Norco 5/325mg  q6h prn for pain. May consider Ortho and MRI if no better   12/20/13 pain is much improved the right hip/groin/thigh area.      Chronic renal disease 07/03/13 Bun/creat 31/1.17. Bun/creat 57/1.71 05/25/13. off Diovan/HCT.    10/09/13 Bun/creat 37/1.2     Anemia of chronic disease 07/03/13 Hgb 8.9 07/03/13. Normal Iron 52, B12 641, Folate 15.5  07/26/13 Hgb 9.5 10/09/13 Hgb 9.3   Senile dementia MMSE 18/30 05/2013 per Dtr. May consider Namenda. Update MMSE     Family/ Staff Communication: observe the patient.   Goals of Care: AL  Labs/tests ordered: none

## 2013-12-20 NOTE — Assessment & Plan Note (Signed)
MMSE 18/30 05/2013 per Dtr. May consider Namenda. Update MMSE

## 2013-12-20 NOTE — Assessment & Plan Note (Signed)
Continue Levothyroxine 25mcg daily. TSH 1.76 05/25/13, 2.124 10/26/13   

## 2013-12-20 NOTE — Assessment & Plan Note (Signed)
Chronic. Has been evaluated by Dr. Evans. Wear dependents for urinary leakage.    

## 2013-12-20 NOTE — Assessment & Plan Note (Signed)
Continue Novasc 22m and Bystolic 10mg  daily for Bp. Controlled. Off Diovan/HCT

## 2013-12-20 NOTE — Assessment & Plan Note (Signed)
11/10/13 s/p fall, ED eval  X-ray R hip  1. Possible nondisplaced right inferior pubic ramus fracture.  2. No femur fracture or dislocation 11/12/13 admitted to SNF-no longer self care sufficiency in AL setting. C/o R leg pain with weight bearing. Adding Norco 5/325mg  q6h prn for pain. May consider Ortho and MRI if no better   12/20/13 pain is much improved the right hip/groin/thigh area.

## 2013-12-20 NOTE — Assessment & Plan Note (Signed)
07/03/13 Bun/creat 31/1.17. Bun/creat 57/1.71 05/25/13. off Diovan/HCT.  10/09/13 Bun/creat 37/1.2 

## 2013-12-20 NOTE — Assessment & Plan Note (Signed)
07/03/13 Hgb 8.9 07/03/13. Normal Iron 52, B12 641, Folate 15.5  07/26/13 Hgb 9.5 10/09/13 Hgb 9.3

## 2014-01-10 ENCOUNTER — Encounter: Payer: Self-pay | Admitting: Nurse Practitioner

## 2014-01-21 ENCOUNTER — Non-Acute Institutional Stay (SKILLED_NURSING_FACILITY): Payer: Medicare Other | Admitting: Nurse Practitioner

## 2014-01-21 ENCOUNTER — Encounter: Payer: Self-pay | Admitting: Nurse Practitioner

## 2014-01-21 DIAGNOSIS — R350 Frequency of micturition: Secondary | ICD-10-CM

## 2014-01-21 DIAGNOSIS — I10 Essential (primary) hypertension: Secondary | ICD-10-CM

## 2014-01-21 DIAGNOSIS — F0390 Unspecified dementia without behavioral disturbance: Secondary | ICD-10-CM

## 2014-01-21 DIAGNOSIS — S32591S Other specified fracture of right pubis, sequela: Secondary | ICD-10-CM

## 2014-01-21 DIAGNOSIS — N182 Chronic kidney disease, stage 2 (mild): Secondary | ICD-10-CM

## 2014-01-21 DIAGNOSIS — F418 Other specified anxiety disorders: Secondary | ICD-10-CM

## 2014-01-21 DIAGNOSIS — K5900 Constipation, unspecified: Secondary | ICD-10-CM

## 2014-01-21 DIAGNOSIS — D638 Anemia in other chronic diseases classified elsewhere: Secondary | ICD-10-CM

## 2014-01-21 NOTE — Assessment & Plan Note (Signed)
11/10/13 s/p fall, ED eval  X-ray R hip  1. Possible nondisplaced right inferior pubic ramus fracture.  2. No femur fracture or dislocation 11/12/13 admitted to SNF-no longer self care sufficiency in AL setting. C/o R leg pain with weight bearing. Adding Norco 5/325mg  q6h prn for pain. May consider Ortho and MRI if no better   12/20/13 pain is much improved the right hip/groin/thigh area.   01/21/14 no c/o pain-consider healed clinically.

## 2014-01-21 NOTE — Progress Notes (Signed)
Patient ID: Patricia Nelson, female   DOB: 09/15/1915, 80 y.o.   MRN: [id number]   Code Status: DNR. POA  Allergies  Allergen Reactions  . Ace Inhibitors     cough  . Mirtazapine     Nightmares   . Penicillins Rash    Chief Complaint  Patient presents with  . Medical Management of Chronic Issues    HPI: Patient is a 80 y.o. female seen in the SNF at [facility] today for management of  chronic medical conditions   ED eval: 11/10/13 sustained occipital laceration from fall at AL [facility]. One staple closure noted and intact @ left occipital area. CT head and cervical spine unremarkable. C/o R leg pain worsens with weight bearing and no longer ambulating as prior since the fall. X-ray 1. Possible nondisplaced right inferior pubic ramus fracture.2. No femur fracture or dislocation    11/12/13 the patient was admitted to SNF for care needs since she is no longer self sufficiency in AL setting.   Problem List Items Addressed This Visit    Urinary frequency - Primary    Chronic. Has been evaluated by Dr. Amalia Hailey. Wear dependents for urinary leakage.        Senile dementia    12/31/13 MMSE 11/30 MMSE 18/30 05/2013 per Dtr. May consider Namenda.    HTN (hypertension)    Continue Novasc 39m and Bystolic 10mg  daily for Bp. Controlled. Off Diovan/HCT      Depression with anxiety    Crys at night, cannot return to sleep after bathroom trips(2-3x/night), stated she hates this place([facility]). She believes her son in law takes advantage of her money. Failed Zoloft, Mirtazapine, and Trazodone.  Better Lexapro to stabilize her mood.         Constipation    Stable, taking MiraLax daily.       Closed fracture of right inferior pubic ramus    11/10/13 s/p fall, ED eval  X-ray R hip  1. Possible nondisplaced right inferior pubic ramus fracture.  2. No femur fracture or dislocation 11/12/13 admitted to SNF-no longer self care sufficiency in AL setting. C/o R leg pain with weight  bearing. Adding Norco 5/325mg  q6h prn for pain. May consider Ortho and MRI if no better   12/20/13 pain is much improved the right hip/groin/thigh area.   01/21/14 no c/o pain-consider healed clinically.       Chronic renal disease    07/03/13 Bun/creat 31/1.17. Bun/creat 57/1.71 05/25/13. off Diovan/HCT.  10/09/13 Bun/creat 37/1.2        Anemia of chronic disease    07/03/13 Hgb 8.9 07/03/13. Normal Iron 52, B12 641, Folate 15.5  07/26/13 Hgb 9.5 10/09/13 Hgb 9.3         Review of Systems:  Review of Systems  Constitutional: Positive for weight loss. Negative for fever, chills, malaise/fatigue and diaphoresis.       Weights stable in the past 3 months: #110Ibs.   HENT: Positive for hearing loss. Negative for congestion, ear discharge, ear pain, nosebleeds, sore throat and tinnitus.   Eyes: Negative for blurred vision, double vision, photophobia, pain, discharge and redness.  Respiratory: Negative for cough, hemoptysis, sputum production, shortness of breath, wheezing and stridor.   Cardiovascular: Negative for chest pain, palpitations, orthopnea, claudication, leg swelling and PND.  Gastrointestinal: Negative for heartburn, nausea, vomiting, abdominal pain, diarrhea, constipation, blood in stool and melena.  Genitourinary: Positive for frequency. Negative for dysuria, urgency, hematuria and flank pain.  Better at night.   Musculoskeletal: Positive for joint pain. Negative for myalgias, back pain, falls and neck pain.       No longer ambulates with walker since fall 11/10/13. C/o R leg pain with weight bearing-much improved.   Skin: Negative for itching and rash.  Neurological: Negative for dizziness, tingling, tremors, sensory change, speech change, focal weakness, seizures, loss of consciousness, weakness and headaches.  Endo/Heme/Allergies: Negative for environmental allergies and polydipsia. Does not bruise/bleed easily.  Psychiatric/Behavioral: Positive for depression and  memory loss. Negative for suicidal ideas, hallucinations and substance abuse. The patient is nervous/anxious and has insomnia.        Sleeps and mood are improved on Lexapro     Past Medical History  Diagnosis Date  . Arthritis   . Depression   . Cataract   . Thyroid disease   . Anxiety   . Osteoporosis   . Hypertension   . Atherosclerotic cerebrovascular disease   . Hyperlipidemia   . Pancreatitis   . Other sleep disturbances   . Backache, unspecified   . Hemorrhage of rectum and anus 03/26/2013  . Diseases of lips 03/26/2013  . Senile osteoporosis     with old T11 fracture  . Chronic kidney disease, unspecified   . Other malaise and fatigue   . Anorexia   . Insomnia, unspecified   . Other symptoms involving cardiovascular system   . Other specified cardiac dysrhythmias(427.89)   . Diaphragmatic hernia without mention of obstruction or gangrene   . Macular degeneration (senile) of retina, unspecified   . Personal history of other diseases of digestive system     pancreatitis from gallstones  . Cholelithiasis   . Unspecified disorders of arteries and arterioles   . Other specified personal history presenting hazards to health(V15.89)   . Personal history of other diseases of circulatory system   . Unspecified hemorrhoids without mention of complication   . Unspecified hearing loss   . Anemia, unspecified   . Rosacea   . Personal history of traumatic fracture   . Personal history of other diseases of respiratory system   . Diverticulosis of colon (without mention of hemorrhage)    Past Surgical History  Procedure Laterality Date  . Abdominal hysterectomy      and BSO fibroids  . Joint replacement  1996  . Colonoscopy  05/23/2007  . Total hip arthroplasty Right    Social History:   reports that she has never smoked. She does not have any smokeless tobacco history on file. She reports that she does not drink alcohol or use illicit drugs.  Family History  Problem  Relation Age of Onset  . Heart disease Mother   . Diabetes Mother   . Heart disease Father   . Diabetes Sister   . Diabetes Brother   . Diabetes Daughter     Medications: Patient's Medications  New Prescriptions   No medications on file  Previous Medications   ACETAMINOPHEN (TYLENOL) 500 MG TABLET    Take 500 mg by mouth. Take one twice daily for pain   AMLODIPINE (NORVASC) 10 MG TABLET    Take 10 mg by mouth daily.   ASCORBIC ACID (VITAMIN C) 1000 MG TABLET    Take 1,000 mg by mouth daily. Take one daily   ASPIRIN 81 MG TABLET    Take 81 mg by mouth daily.   ATORVASTATIN (LIPITOR) 10 MG TABLET    Take one tablet daily   BYSTOLIC 10 MG TABLET  Take one tablet daily for blood pressusre   CALCIUM CARBONATE-VITAMIN D (CALCIUM 600+D) 600-400 MG-UNIT PER TABLET    Take 1 tablet by mouth 2 (two) times daily.    CHOLECALCIFEROL (VITAMIN D) 1000 UNITS TABLET    Take 1,000 Units by mouth daily.   CLINDAMYCIN (CLEOCIN) 150 MG CAPSULE    Take 150 mg by mouth. 4 caps one hour prior dental   COENZYME Q10 (CO Q-10) 200 MG CAPS    Take 1 capsule by mouth daily. Take one daily   ENSURE PLUS (ENSURE PLUS) LIQD    Take 237 mLs by mouth 2 (two) times daily between meals.   ESCITALOPRAM (LEXAPRO) 10 MG TABLET    Take one tablet daily   GUAIFENESIN (HUMIBID E) 400 MG TABS TABLET    Take 400 mg by mouth. Every 4 hours as needed cough   IBUPROFEN (ADVIL,MOTRIN) 400 MG TABLET    Take 400 mg by mouth every 4 (four) hours as needed for moderate pain.    LEVOTHYROXINE (SYNTHROID, LEVOTHROID) 25 MCG TABLET    Take 25 mcg by mouth daily.   MIRTAZAPINE (REMERON) 30 MG TABLET    Take 30 mg by mouth at bedtime. One at bedtime   MULTIPLE VITAMINS-MINERALS (ICAPS PO)    Take by mouth. Take one daily for eyes   OMEGA-3 FATTY ACIDS (FISH OIL) 1000 MG CAPS    Take by mouth. Take one tablet daily   POLYETHYL GLYCOL-PROPYL GLYCOL (SYSTANE) 0.4-0.3 % SOLN    Apply to eye. One drop in both eyes as needed for dry eyes    POLYETHYLENE GLYCOL (MIRALAX / GLYCOLAX) PACKET    Take 17 g by mouth every 3 (three) days.    RESTASIS 0.05 % OPHTHALMIC EMULSION    One drop both eyes twice daily   TRAZODONE (DESYREL) 50 MG TABLET    Take 25 mg by mouth at bedtime as needed for sleep. Take 1/2 tablet at bedtime  Modified Medications   No medications on file  Discontinued Medications   No medications on file     Physical Exam: Physical Exam  Constitutional: She is oriented to person, place, and time. She appears well-developed and well-nourished. No distress.  HENT:  Head: Normocephalic and atraumatic.  Right Ear: External ear normal.  Left Ear: External ear normal.  Nose: Nose normal.  Mouth/Throat: Oropharynx is clear and moist. No oropharyngeal exudate.  Eyes: Conjunctivae and EOM are normal. Pupils are equal, round, and reactive to light. Right eye exhibits no discharge. Left eye exhibits no discharge. No scleral icterus.  Neck: Normal range of motion. Neck supple. No JVD present. No tracheal deviation present. No thyromegaly present.  Cardiovascular: Normal rate, regular rhythm and intact distal pulses.  Exam reveals no gallop and no friction rub.   Murmur heard. 6-1/6 systolic murmur  Pulmonary/Chest: Effort normal and breath sounds normal. No stridor. No respiratory distress. She has no wheezes. She has no rales. She exhibits no tenderness.  Abdominal: Soft. Bowel sounds are normal. She exhibits no distension and no mass. There is no tenderness. There is no rebound and no guarding.  Prior exam: 2 small hemorrhoids at 11 am.   Genitourinary: Guaiac negative stool.  Musculoskeletal: Normal range of motion. She exhibits tenderness. She exhibits no edema.  R leg pain in groin and upper leg with weight bearing since fall 11/10/13-much improved.   Lymphadenopathy:    She has no cervical adenopathy.  Neurological: She is alert and oriented to person, place, and  time. She has normal reflexes. No cranial nerve deficit.  She exhibits abnormal muscle tone. Coordination normal.  Skin: Skin is warm. No rash noted. She is not diaphoretic. No erythema. No pallor.     Psychiatric: Her mood appears anxious. Her affect is angry. Her affect is not blunt, not labile and not inappropriate. Her speech is not delayed, not tangential and not slurred. She is not agitated, not aggressive, not hyperactive, not slowed, not withdrawn, not actively hallucinating and not combative. Thought content is paranoid. Thought content is not delusional. Cognition and memory are impaired. She does not express impulsivity or inappropriate judgment. She exhibits a depressed mood. She expresses no homicidal and no suicidal ideation. She expresses no suicidal plans and no homicidal plans. She is communicative. She exhibits abnormal recent memory and abnormal remote memory.  Stabilized in SNF She is attentive.    Filed Vitals:   01/21/14 1307  BP: 140/60  Pulse: 72  Temp: 97.4 F (36.3 C)  TempSrc: Tympanic  Resp: 20      Labs reviewed: Basic Metabolic Panel:  Recent Labs  05/25/13 07/26/13 10/09/13 10/26/13 11/10/13 0701  NA 141 142 139  --  142  K 4.4 4.0 4.0  --  3.6*  CL  --   --   --   --  102  CO2  --   --   --   --  25  GLUCOSE  --   --   --   --  120*  BUN 57* 31* 37*  --  43*  CREATININE 1.7* 1.2* 1.2*  --  1.22*  CALCIUM  --   --   --   --  9.9  TSH 1.76  --   --  2.12  --    CBC:  Recent Labs  07/26/13 10/09/13 11/10/13 0701  WBC 4.0 3.9 11.6*  NEUTROABS  --   --  10.3*  HGB 9.5* 9.3* 10.9*  HCT 28* 28* 32.9*  MCV  --   --  95.1  PLT 208 179 154    Past Procedures:  10/24/12 EKG: sinus rhythm with marked sinus arrhythmia.   05/16/13 X-ray thoracic spine: mold compression fractures no evidence of a recent fracture. However, a subtle increase in an old fracture may be inapparent radio graphically.   11/10/13 X-ray R hip:  IMPRESSION: 1. Possible nondisplaced right inferior pubic ramus fracture. 2. No  femur fracture or dislocation   11/10/13 CT head and cervical spine:  IMPRESSION: 1. No evidence of traumatic intracranial injury or fracture. 2. No evidence of fracture or subluxation along the cervical spine. 3. Soft tissue swelling at the right vertex. 4. Moderate cortical volume loss and scattered small vessel ischemic microangiopathy. 5. Mild degenerative change noted along the cervical spine. 6. Dense calcification and scarring at the lung apices. 7. Dense calcification noted at the left carotid bifurcation. Carotid ultrasound would be helpful for further evaluation, when and as deemed clinically appropriate.       Assessment/Plan Urinary frequency Chronic. Has been evaluated by Dr. Amalia Hailey. Wear dependents for urinary leakage.      Constipation Stable, taking MiraLax daily.     HTN (hypertension) Continue Novasc 39m and Bystolic 10mg  daily for Bp. Controlled. Off Diovan/HCT    Depression with anxiety Crys at night, cannot return to sleep after bathroom trips(2-3x/night), stated she hates this place([facility]). She believes her son in law takes advantage of her money. Failed Zoloft, Mirtazapine, and Trazodone.  Better Lexapro to stabilize her mood.  Closed fracture of right inferior pubic ramus 11/10/13 s/p fall, ED eval  X-ray R hip  1. Possible nondisplaced right inferior pubic ramus fracture.  2. No femur fracture or dislocation 11/12/13 admitted to SNF-no longer self care sufficiency in AL setting. C/o R leg pain with weight bearing. Adding Norco 5/325mg  q6h prn for pain. May consider Ortho and MRI if no better   12/20/13 pain is much improved the right hip/groin/thigh area.   01/21/14 no c/o pain-consider healed clinically.     Chronic renal disease 07/03/13 Bun/creat 31/1.17. Bun/creat 57/1.71 05/25/13. off Diovan/HCT.  10/09/13 Bun/creat 37/1.2      Anemia of chronic disease 07/03/13 Hgb 8.9 07/03/13. Normal Iron 52, B12 641, Folate 15.5  07/26/13 Hgb  9.5 10/09/13 Hgb 9.3    Senile dementia 12/31/13 MMSE 11/30 MMSE 18/30 05/2013 per Dtr. May consider Namenda.    Family/ Staff Communication: observe the patient.   Goals of Care: SNF  Labs/tests ordered: none

## 2014-01-21 NOTE — Assessment & Plan Note (Signed)
Stable, taking MiraLax daily.

## 2014-01-21 NOTE — Assessment & Plan Note (Signed)
Continue Novasc 26m and Bystolic 10mg  daily for Bp. Controlled. Off Diovan/HCT

## 2014-01-21 NOTE — Assessment & Plan Note (Signed)
12/31/13 MMSE 11/30 MMSE 18/30 05/2013 per Dtr. May consider Namenda.

## 2014-01-21 NOTE — Assessment & Plan Note (Signed)
Chronic. Has been evaluated by Dr. Evans. Wear dependents for urinary leakage.    

## 2014-01-21 NOTE — Assessment & Plan Note (Signed)
07/03/13 Hgb 8.9 07/03/13. Normal Iron 52, B12 641, Folate 15.5  07/26/13 Hgb 9.5 10/09/13 Hgb 9.3

## 2014-01-21 NOTE — Assessment & Plan Note (Signed)
Crys at night, cannot return to sleep after bathroom trips(2-3x/night), stated she hates this place([facility]). She believes her son in law takes advantage of her money. Failed Zoloft, Mirtazapine, and Trazodone.  Better Lexapro to stabilize her mood.

## 2014-01-21 NOTE — Assessment & Plan Note (Signed)
07/03/13 Bun/creat 31/1.17. Bun/creat 57/1.71 05/25/13. off Diovan/HCT.  10/09/13 Bun/creat 37/1.2 

## 2014-02-06 ENCOUNTER — Encounter: Payer: Self-pay | Admitting: Nurse Practitioner

## 2014-02-06 ENCOUNTER — Non-Acute Institutional Stay (SKILLED_NURSING_FACILITY): Payer: Medicare Other | Admitting: Nurse Practitioner

## 2014-02-06 DIAGNOSIS — K5900 Constipation, unspecified: Secondary | ICD-10-CM

## 2014-02-06 DIAGNOSIS — S32591S Other specified fracture of right pubis, sequela: Secondary | ICD-10-CM

## 2014-02-06 DIAGNOSIS — D638 Anemia in other chronic diseases classified elsewhere: Secondary | ICD-10-CM

## 2014-02-06 DIAGNOSIS — I10 Essential (primary) hypertension: Secondary | ICD-10-CM

## 2014-02-06 DIAGNOSIS — F418 Other specified anxiety disorders: Secondary | ICD-10-CM

## 2014-02-06 DIAGNOSIS — F0390 Unspecified dementia without behavioral disturbance: Secondary | ICD-10-CM

## 2014-02-06 DIAGNOSIS — E039 Hypothyroidism, unspecified: Secondary | ICD-10-CM

## 2014-02-06 DIAGNOSIS — N182 Chronic kidney disease, stage 2 (mild): Secondary | ICD-10-CM

## 2014-02-06 NOTE — Assessment & Plan Note (Addendum)
07/03/13 Hgb 8.9 07/03/13. Normal Iron 52, B12 641, Folate 15.5  07/26/13 Hgb 9.5 10/09/13 Hgb 9.3 02/06/14 update CBC

## 2014-02-06 NOTE — Progress Notes (Signed)
Patient ID: Patricia Nelson, female   DOB: [birth year], 80 y.o.   MRN: [id number]   Code Status: DNR. POA  Allergies  Allergen Reactions  . Ace Inhibitors     cough  . Mirtazapine     Nightmares   . Penicillins Rash    Chief Complaint  Patient presents with  . Medical Management of Chronic Issues    HPI: Patient is a 80 y.o. female seen in the SNF at [facility] today for management of  chronic medical conditions   ED eval: 11/10/13 sustained occipital laceration from fall at AL [facility]. One staple closure noted and intact @ left occipital area. CT head and cervical spine unremarkable. C/o R leg pain worsens with weight bearing and no longer ambulating as prior since the fall. X-ray 1. Possible nondisplaced right inferior pubic ramus fracture.2. No femur fracture or dislocation    11/12/13 the patient was admitted to SNF for care needs since she is no longer self sufficiency in AL setting.   Problem List Items Addressed This Visit    Senile dementia    12/31/13 MMSE 11/30 MMSE 18/30 05/2013 per Dtr. May consider Namenda.     Hypothyroidism    Continue Levothyroxine 49mcg daily. TSH 1.76 05/25/13, 2.124 10/26/13      HTN (hypertension) - Primary    Continue Novasc 70m and Bystolic 10mg  daily for Bp. Controlled. Off Diovan/HCT. Takes Atorvastatin and ASA for cardiovascular risk reduction.       Depression with anxiety    Crys at night, cannot return to sleep after bathroom trips(2-3x/night), stated she hates this place([facility]). She believes her son in law takes advantage of her money. Failed Zoloft, Mirtazapine, and Trazodone. Better Lexapro to stabilize her mood.  12/20/13 SNF [facility] stable.  02/06/14 mood is stable.        Constipation    Stable, taking MiraLax q3 days       Closed fracture of right inferior pubic ramus    11/10/13 s/p fall, ED eval  X-ray R hip  1. Possible nondisplaced right inferior pubic ramus fracture.  2. No femur fracture or  dislocation 11/12/13 admitted to SNF-no longer self care sufficiency in AL setting. C/o R leg pain with weight bearing. Adding Norco 5/325mg  q6h prn for pain. May consider Ortho and MRI if no better   12/20/13 pain is much improved the right hip/groin/thigh area.  01/21/14 no c/o pain-consider healed clinically.  02/06/14 dc prn Norco/Motrin-not used. Tylenol 650mg  q6 prn available to her in addition to Tylenol 500mg  bid.        Chronic renal disease    07/03/13 Bun/creat 31/1.17. Bun/creat 57/1.71 05/25/13. off Diovan/HCT.  10/09/13 Bun/creat 37/1.2 02/06/14 update CMP      Anemia of chronic disease    07/03/13 Hgb 8.9 07/03/13. Normal Iron 52, B12 641, Folate 15.5  07/26/13 Hgb 9.5 10/09/13 Hgb 9.3 02/06/14 update CBC          Review of Systems:  Review of Systems  Constitutional: Positive for weight loss. Negative for fever, chills, malaise/fatigue and diaphoresis.       Weights stable in the past 3 months: #110Ibs.   HENT: Positive for hearing loss. Negative for congestion, ear discharge, ear pain, nosebleeds, sore throat and tinnitus.   Eyes: Negative for blurred vision, double vision, photophobia, pain, discharge and redness.  Respiratory: Negative for cough, hemoptysis, sputum production, shortness of breath, wheezing and stridor.   Cardiovascular: Negative for chest pain, palpitations, orthopnea, claudication, leg swelling  and PND.  Gastrointestinal: Negative for heartburn, nausea, vomiting, abdominal pain, diarrhea, constipation, blood in stool and melena.  Genitourinary: Positive for frequency. Negative for dysuria, urgency, hematuria and flank pain.       Better at night.   Musculoskeletal: Positive for joint pain. Negative for myalgias, back pain, falls and neck pain.       No longer ambulates with walker since fall 11/10/13. C/o R leg pain with weight bearing-much improved.   Skin: Negative for itching and rash.  Neurological: Negative for dizziness, tingling, tremors,  sensory change, speech change, focal weakness, seizures, loss of consciousness, weakness and headaches.  Endo/Heme/Allergies: Negative for environmental allergies and polydipsia. Does not bruise/bleed easily.  Psychiatric/Behavioral: Positive for depression and memory loss. Negative for suicidal ideas, hallucinations and substance abuse. The patient is nervous/anxious and has insomnia.        Sleeps and mood are improved on Lexapro     Past Medical History  Diagnosis Date  . Arthritis   . Depression   . Cataract   . Thyroid disease   . Anxiety   . Osteoporosis   . Hypertension   . Atherosclerotic cerebrovascular disease   . Hyperlipidemia   . Pancreatitis   . Other sleep disturbances   . Backache, unspecified   . Hemorrhage of rectum and anus 03/26/2013  . Diseases of lips 03/26/2013  . Senile osteoporosis     with old T11 fracture  . Chronic kidney disease, unspecified   . Other malaise and fatigue   . Anorexia   . Insomnia, unspecified   . Other symptoms involving cardiovascular system   . Other specified cardiac dysrhythmias(427.89)   . Diaphragmatic hernia without mention of obstruction or gangrene   . Macular degeneration (senile) of retina, unspecified   . Personal history of other diseases of digestive system     pancreatitis from gallstones  . Cholelithiasis   . Unspecified disorders of arteries and arterioles   . Other specified personal history presenting hazards to health(V15.89)   . Personal history of other diseases of circulatory system   . Unspecified hemorrhoids without mention of complication   . Unspecified hearing loss   . Anemia, unspecified   . Rosacea   . Personal history of traumatic fracture   . Personal history of other diseases of respiratory system   . Diverticulosis of colon (without mention of hemorrhage)    Past Surgical History  Procedure Laterality Date  . Abdominal hysterectomy      and BSO fibroids  . Joint replacement  1996  .  Colonoscopy  05/23/2007  . Total hip arthroplasty Right    Social History:   reports that she has never smoked. She does not have any smokeless tobacco history on file. She reports that she does not drink alcohol or use illicit drugs.  Family History  Problem Relation Age of Onset  . Heart disease Mother   . Diabetes Mother   . Heart disease Father   . Diabetes Sister   . Diabetes Brother   . Diabetes Daughter     Medications: Patient's Medications  New Prescriptions   No medications on file  Previous Medications   ACETAMINOPHEN (TYLENOL) 325 MG TABLET    Take 650 mg by mouth every 6 (six) hours as needed.   AMLODIPINE (NORVASC) 10 MG TABLET    Take 10 mg by mouth daily.   ASCORBIC ACID (VITAMIN C) 1000 MG TABLET    Take 1,000 mg by mouth daily. Take one daily  ASPIRIN 81 MG TABLET    Take 81 mg by mouth daily.   ATORVASTATIN (LIPITOR) 10 MG TABLET    Take one tablet daily   BYSTOLIC 10 MG TABLET    Take one tablet daily for blood pressusre   CALCIUM CARBONATE-VITAMIN D (CALCIUM 600+D) 600-400 MG-UNIT PER TABLET    Take 1 tablet by mouth 2 (two) times daily.    CHOLECALCIFEROL (VITAMIN D) 1000 UNITS TABLET    Take 1,000 Units by mouth daily.   CLINDAMYCIN (CLEOCIN) 150 MG CAPSULE    Take 150 mg by mouth. 4 caps one hour prior dental   COENZYME Q10 (CO Q-10) 200 MG CAPS    Take 1 capsule by mouth daily. Take one daily   ENSURE PLUS (ENSURE PLUS) LIQD    Take 237 mLs by mouth 2 (two) times daily between meals.   ESCITALOPRAM (LEXAPRO) 10 MG TABLET    Take one tablet daily   GUAIFENESIN (HUMIBID E) 400 MG TABS TABLET    Take 400 mg by mouth. Every 4 hours as needed cough   LEVOTHYROXINE (SYNTHROID, LEVOTHROID) 25 MCG TABLET    Take 25 mcg by mouth daily.   MIRTAZAPINE (REMERON) 30 MG TABLET    Take 30 mg by mouth at bedtime. One at bedtime   MULTIPLE VITAMINS-MINERALS (ICAPS PO)    Take by mouth. Take one daily for eyes   OMEGA-3 FATTY ACIDS (FISH OIL) 1000 MG CAPS    Take by mouth.  Take one tablet daily   POLYETHYL GLYCOL-PROPYL GLYCOL (SYSTANE) 0.4-0.3 % SOLN    Apply to eye. One drop in both eyes as needed for dry eyes   POLYETHYLENE GLYCOL (MIRALAX / GLYCOLAX) PACKET    Take 17 g by mouth every 3 (three) days.    RESTASIS 0.05 % OPHTHALMIC EMULSION    One drop both eyes twice daily   TRAZODONE (DESYREL) 50 MG TABLET    Take 25 mg by mouth at bedtime as needed for sleep. Take 1/2 tablet at bedtime  Modified Medications   No medications on file  Discontinued Medications   ACETAMINOPHEN (TYLENOL) 500 MG TABLET    Take 500 mg by mouth. Take one twice daily for pain   IBUPROFEN (ADVIL,MOTRIN) 400 MG TABLET    Take 400 mg by mouth every 4 (four) hours as needed for moderate pain.      Physical Exam: Physical Exam  Constitutional: She is oriented to person, place, and time. She appears well-developed and well-nourished. No distress.  HENT:  Head: Normocephalic and atraumatic.  Right Ear: External ear normal.  Left Ear: External ear normal.  Nose: Nose normal.  Mouth/Throat: Oropharynx is clear and moist. No oropharyngeal exudate.  Eyes: Conjunctivae and EOM are normal. Pupils are equal, round, and reactive to light. Right eye exhibits no discharge. Left eye exhibits no discharge. No scleral icterus.  Neck: Normal range of motion. Neck supple. No JVD present. No tracheal deviation present. No thyromegaly present.  Cardiovascular: Normal rate, regular rhythm and intact distal pulses.  Exam reveals no gallop and no friction rub.   Murmur heard. 3-6/1 systolic murmur  Pulmonary/Chest: Effort normal and breath sounds normal. No stridor. No respiratory distress. She has no wheezes. She has no rales. She exhibits no tenderness.  Abdominal: Soft. Bowel sounds are normal. She exhibits no distension and no mass. There is no tenderness. There is no rebound and no guarding.  Prior exam: 2 small hemorrhoids at 11 am.   Genitourinary: Guaiac negative stool.  Musculoskeletal:  Normal range of motion. She exhibits tenderness. She exhibits no edema.  R leg pain in groin and upper leg with weight bearing since fall 11/10/13-much improved.   Lymphadenopathy:    She has no cervical adenopathy.  Neurological: She is alert and oriented to person, place, and time. She has normal reflexes. No cranial nerve deficit. She exhibits abnormal muscle tone. Coordination normal.  Skin: Skin is warm. No rash noted. She is not diaphoretic. No erythema. No pallor.     Psychiatric: Her mood appears anxious. Her affect is angry. Her affect is not blunt, not labile and not inappropriate. Her speech is not delayed, not tangential and not slurred. She is not agitated, not aggressive, not hyperactive, not slowed, not withdrawn, not actively hallucinating and not combative. Thought content is paranoid. Thought content is not delusional. Cognition and memory are impaired. She does not express impulsivity or inappropriate judgment. She exhibits a depressed mood. She expresses no homicidal and no suicidal ideation. She expresses no suicidal plans and no homicidal plans. She is communicative. She exhibits abnormal recent memory and abnormal remote memory.  Stabilized in SNF She is attentive.    Filed Vitals:   02/06/14 1601  BP: 108/58  Pulse: 60  Temp: 97.4 F (36.3 C)  TempSrc: Tympanic  Resp: 14      Labs reviewed: Basic Metabolic Panel:  Recent Labs  05/25/13 07/26/13 10/09/13 10/26/13 11/10/13 0701  NA 141 142 139  --  142  K 4.4 4.0 4.0  --  3.6*  CL  --   --   --   --  102  CO2  --   --   --   --  25  GLUCOSE  --   --   --   --  120*  BUN 57* 31* 37*  --  43*  CREATININE 1.7* 1.2* 1.2*  --  1.22*  CALCIUM  --   --   --   --  9.9  TSH 1.76  --   --  2.12  --    CBC:  Recent Labs  07/26/13 10/09/13 11/10/13 0701  WBC 4.0 3.9 11.6*  NEUTROABS  --   --  10.3*  HGB 9.5* 9.3* 10.9*  HCT 28* 28* 32.9*  MCV  --   --  95.1  PLT 208 179 154    Past Procedures:  10/24/12  EKG: sinus rhythm with marked sinus arrhythmia.   05/16/13 X-ray thoracic spine: mold compression fractures no evidence of a recent fracture. However, a subtle increase in an old fracture may be inapparent radio graphically.   11/10/13 X-ray R hip:  IMPRESSION: 1. Possible nondisplaced right inferior pubic ramus fracture. 2. No femur fracture or dislocation   11/10/13 CT head and cervical spine:  IMPRESSION: 1. No evidence of traumatic intracranial injury or fracture. 2. No evidence of fracture or subluxation along the cervical spine. 3. Soft tissue swelling at the right vertex. 4. Moderate cortical volume loss and scattered small vessel ischemic microangiopathy. 5. Mild degenerative change noted along the cervical spine. 6. Dense calcification and scarring at the lung apices. 7. Dense calcification noted at the left carotid bifurcation. Carotid ultrasound would be helpful for further evaluation, when and as deemed clinically appropriate.       Assessment/Plan Closed fracture of right inferior pubic ramus 11/10/13 s/p fall, ED eval  X-ray R hip  1. Possible nondisplaced right inferior pubic ramus fracture.  2. No femur fracture or dislocation 11/12/13 admitted to SNF-no  longer self care sufficiency in AL setting. C/o R leg pain with weight bearing. Adding Norco 5/325mg  q6h prn for pain. May consider Ortho and MRI if no better   12/20/13 pain is much improved the right hip/groin/thigh area.  01/21/14 no c/o pain-consider healed clinically.  02/06/14 dc prn Norco/Motrin-not used. Tylenol 650mg  q6 prn available to her in addition to Tylenol 500mg  bid.      Constipation Stable, taking MiraLax q3 days     Hypothyroidism Continue Levothyroxine 46mcg daily. TSH 1.76 05/25/13, 2.124 10/26/13    HTN (hypertension) Continue Novasc 65m and Bystolic 10mg  daily for Bp. Controlled. Off Diovan/HCT. Takes Atorvastatin and ASA for cardiovascular risk reduction.     Senile  dementia 12/31/13 MMSE 11/30 MMSE 18/30 05/2013 per Dtr. May consider Namenda.   Depression with anxiety Crys at night, cannot return to sleep after bathroom trips(2-3x/night), stated she hates this place([facility]). She believes her son in law takes advantage of her money. Failed Zoloft, Mirtazapine, and Trazodone. Better Lexapro to stabilize her mood.  12/20/13 SNF [facility] stable.  02/06/14 mood is stable.      Anemia of chronic disease 07/03/13 Hgb 8.9 07/03/13. Normal Iron 52, B12 641, Folate 15.5  07/26/13 Hgb 9.5 10/09/13 Hgb 9.3 02/06/14 update CBC     Chronic renal disease 07/03/13 Bun/creat 31/1.17. Bun/creat 57/1.71 05/25/13. off Diovan/HCT.  10/09/13 Bun/creat 37/1.2 02/06/14 update CMP      Family/ Staff Communication: observe the patient.   Goals of Care: SNF  Labs/tests ordered: none

## 2014-02-06 NOTE — Assessment & Plan Note (Signed)
Stable, taking MiraLax q3 days   

## 2014-02-06 NOTE — Assessment & Plan Note (Signed)
Continue Novasc 10m and Bystolic 10mg daily for Bp. Controlled. Off Diovan/HCT. Takes Atorvastatin and ASA for cardiovascular risk reduction.     

## 2014-02-06 NOTE — Assessment & Plan Note (Signed)
Crys at night, cannot return to sleep after bathroom trips(2-3x/night), stated she hates this place([facility]). She believes her son in law takes advantage of her money. Failed Zoloft, Mirtazapine, and Trazodone. Better Lexapro to stabilize her mood.  12/20/13 SNF [facility] stable.  02/06/14 mood is stable.

## 2014-02-06 NOTE — Assessment & Plan Note (Addendum)
07/03/13 Bun/creat 31/1.17. Bun/creat 57/1.71 05/25/13. off Diovan/HCT.  10/09/13 Bun/creat 37/1.2 02/06/14 update CMP

## 2014-02-06 NOTE — Assessment & Plan Note (Signed)
Continue Levothyroxine 25mcg daily. TSH 1.76 05/25/13, 2.124 10/26/13   

## 2014-02-06 NOTE — Assessment & Plan Note (Signed)
12/31/13 MMSE 11/30 MMSE 18/30 05/2013 per Dtr. May consider Namenda.

## 2014-02-06 NOTE — Assessment & Plan Note (Addendum)
11/10/13 s/p fall, ED eval  X-ray R hip  1. Possible nondisplaced right inferior pubic ramus fracture.  2. No femur fracture or dislocation 11/12/13 admitted to SNF-no longer self care sufficiency in AL setting. C/o R leg pain with weight bearing. Adding Norco 5/325mg  q6h prn for pain. May consider Ortho and MRI if no better   12/20/13 pain is much improved the right hip/groin/thigh area.  01/21/14 no c/o pain-consider healed clinically.  02/06/14 dc prn Norco/Motrin-not used. Tylenol 650mg  q6 prn available to her in addition to Tylenol 500mg  bid.

## 2014-03-06 ENCOUNTER — Encounter: Payer: Self-pay | Admitting: Nurse Practitioner

## 2014-03-06 ENCOUNTER — Non-Acute Institutional Stay (SKILLED_NURSING_FACILITY): Payer: PPO | Admitting: Nurse Practitioner

## 2014-03-06 DIAGNOSIS — I10 Essential (primary) hypertension: Secondary | ICD-10-CM

## 2014-03-06 DIAGNOSIS — E039 Hypothyroidism, unspecified: Secondary | ICD-10-CM | POA: Diagnosis not present

## 2014-03-06 DIAGNOSIS — N182 Chronic kidney disease, stage 2 (mild): Secondary | ICD-10-CM | POA: Diagnosis not present

## 2014-03-06 DIAGNOSIS — K5900 Constipation, unspecified: Secondary | ICD-10-CM

## 2014-03-06 DIAGNOSIS — D638 Anemia in other chronic diseases classified elsewhere: Secondary | ICD-10-CM

## 2014-03-06 DIAGNOSIS — F418 Other specified anxiety disorders: Secondary | ICD-10-CM | POA: Diagnosis not present

## 2014-03-06 NOTE — Assessment & Plan Note (Signed)
Crys at night, cannot return to sleep after bathroom trips(2-3x/night), stated she hates this place([facility]). She believes her son in law takes advantage of her money. Failed Zoloft, Mirtazapine, and Trazodone. Better Lexapro to stabilize her mood.  12/20/13 SNF [facility] stable.  02/06/14 mood is stable.  03/06/14 stable.

## 2014-03-06 NOTE — Assessment & Plan Note (Signed)
Stable, taking MiraLax q3 days

## 2014-03-06 NOTE — Assessment & Plan Note (Signed)
07/03/13 Bun/creat 31/1.17. Bun/creat 57/1.71 05/25/13. off Diovan/HCT.  10/09/13 Bun/creat 37/1.2

## 2014-03-06 NOTE — Assessment & Plan Note (Signed)
Continue Levothyroxine 66mcg daily. TSH 1.76 05/25/13, 2.124 10/26/13

## 2014-03-06 NOTE — Progress Notes (Signed)
Patient ID: Patricia Nelson, female   DOB: 12-08-1915, 81 y.o.   MRN: [id number]   Code Status: DNR. POA  Allergies  Allergen Reactions  . Ace Inhibitors     cough  . Mirtazapine     Nightmares   . Penicillins Rash    Chief Complaint  Patient presents with  . Medical Management of Chronic Issues    HPI: Patient is a 81 y.o. female seen in the SNF at [facility] today for management of  chronic medical conditions   ED eval: 11/10/13 sustained occipital laceration from fall at AL [facility]. One staple closure noted and intact @ left occipital area. CT head and cervical spine unremarkable. C/o R leg pain worsens with weight bearing and no longer ambulating as prior since the fall. X-ray 1. Possible nondisplaced right inferior pubic ramus fracture.2. No femur fracture or dislocation    11/12/13 the patient was admitted to SNF for care needs since she is no longer self sufficiency in AL setting.   Problem List Items Addressed This Visit    Hypothyroidism    Continue Levothyroxine 78mcg daily. TSH 1.76 05/25/13, 2.124 10/26/13        HTN (hypertension)    Continue Novasc 72m and Bystolic 10mg  daily for Bp. Controlled. Off Diovan/HCT. Takes Atorvastatin and ASA for cardiovascular risk reduction.        Depression with anxiety    Crys at night, cannot return to sleep after bathroom trips(2-3x/night), stated she hates this place([facility]). She believes her son in law takes advantage of her money. Failed Zoloft, Mirtazapine, and Trazodone. Better Lexapro to stabilize her mood.  12/20/13 SNF [facility] stable.  02/06/14 mood is stable.  03/06/14 stable.       Constipation    Stable, taking MiraLax q3 days         Chronic renal disease    07/03/13 Bun/creat 31/1.17. Bun/creat 57/1.71 05/25/13. off Diovan/HCT.  10/09/13 Bun/creat 37/1.2      Anemia of chronic disease - Primary    07/03/13 Hgb 8.9 07/03/13. Normal Iron 52, B12 641, Folate 15.5  07/26/13 Hgb 9.5 10/09/13 Hgb 9.3 11/10/13 Hgb  10.9           Review of Systems:  Review of Systems  Constitutional: Negative for fever, chills, weight loss, malaise/fatigue and diaphoresis.  HENT: Positive for hearing loss. Negative for congestion, ear discharge, ear pain, nosebleeds, sore throat and tinnitus.   Eyes: Negative for blurred vision, double vision, photophobia, pain, discharge and redness.  Respiratory: Negative for cough, hemoptysis, sputum production, shortness of breath, wheezing and stridor.   Cardiovascular: Negative for chest pain, palpitations, orthopnea, claudication, leg swelling and PND.  Gastrointestinal: Negative for heartburn, nausea, vomiting, abdominal pain, diarrhea, constipation, blood in stool and melena.  Genitourinary: Negative for dysuria, urgency, frequency, hematuria and flank pain.  Musculoskeletal: Positive for joint pain. Negative for myalgias, back pain, falls and neck pain.  Skin: Negative for itching and rash.  Neurological: Negative for dizziness, tingling, tremors, sensory change, speech change, focal weakness, weakness and headaches.  Endo/Heme/Allergies: Negative for environmental allergies and polydipsia. Does not bruise/bleed easily.  Psychiatric/Behavioral: Positive for depression and memory loss. Negative for suicidal ideas, hallucinations and substance abuse. The patient is nervous/anxious. The patient does not have insomnia.      Past Medical History  Diagnosis Date  . Arthritis   . Depression   . Cataract   . Thyroid disease   . Anxiety   . Osteoporosis   . Hypertension   .  Atherosclerotic cerebrovascular disease   . Hyperlipidemia   . Pancreatitis   . Other sleep disturbances   . Backache, unspecified   . Hemorrhage of rectum and anus 03/26/2013  . Diseases of lips 03/26/2013  . Senile osteoporosis     with old T11 fracture  . Chronic kidney disease, unspecified   . Other malaise and fatigue   . Anorexia   . Insomnia, unspecified   . Other symptoms involving  cardiovascular system   . Other specified cardiac dysrhythmias(427.89)   . Diaphragmatic hernia without mention of obstruction or gangrene   . Macular degeneration (senile) of retina, unspecified   . Personal history of other diseases of digestive system     pancreatitis from gallstones  . Cholelithiasis   . Unspecified disorders of arteries and arterioles   . Other specified personal history presenting hazards to health(V15.89)   . Personal history of other diseases of circulatory system   . Unspecified hemorrhoids without mention of complication   . Unspecified hearing loss   . Anemia, unspecified   . Rosacea   . Personal history of traumatic fracture   . Personal history of other diseases of respiratory system   . Diverticulosis of colon (without mention of hemorrhage)    Past Surgical History  Procedure Laterality Date  . Abdominal hysterectomy      and BSO fibroids  . Joint replacement  1996  . Colonoscopy  05/23/2007  . Total hip arthroplasty Right    Social History:   reports that she has never smoked. She does not have any smokeless tobacco history on file. She reports that she does not drink alcohol or use illicit drugs.  Family History  Problem Relation Age of Onset  . Heart disease Mother   . Diabetes Mother   . Heart disease Father   . Diabetes Sister   . Diabetes Brother   . Diabetes Daughter     Medications: Patient's Medications  New Prescriptions   No medications on file  Previous Medications   ACETAMINOPHEN (TYLENOL) 325 MG TABLET    Take 650 mg by mouth every 6 (six) hours as needed.   AMLODIPINE (NORVASC) 10 MG TABLET    Take 10 mg by mouth daily.   ASCORBIC ACID (VITAMIN C) 1000 MG TABLET    Take 1,000 mg by mouth daily. Take one daily   ASPIRIN 81 MG TABLET    Take 81 mg by mouth daily.   ATORVASTATIN (LIPITOR) 10 MG TABLET    Take one tablet daily   BYSTOLIC 10 MG TABLET    Take one tablet daily for blood pressusre   CALCIUM CARBONATE-VITAMIN D  (CALCIUM 600+D) 600-400 MG-UNIT PER TABLET    Take 1 tablet by mouth 2 (two) times daily.    CHOLECALCIFEROL (VITAMIN D) 1000 UNITS TABLET    Take 1,000 Units by mouth daily.   CLINDAMYCIN (CLEOCIN) 150 MG CAPSULE    Take 150 mg by mouth. 4 caps one hour prior dental   COENZYME Q10 (CO Q-10) 200 MG CAPS    Take 1 capsule by mouth daily. Take one daily   ENSURE PLUS (ENSURE PLUS) LIQD    Take 237 mLs by mouth 2 (two) times daily between meals.   ESCITALOPRAM (LEXAPRO) 10 MG TABLET    Take one tablet daily   GUAIFENESIN (HUMIBID E) 400 MG TABS TABLET    Take 400 mg by mouth. Every 4 hours as needed cough   LEVOTHYROXINE (SYNTHROID, LEVOTHROID) 25 MCG TABLET  Take 25 mcg by mouth daily.   MIRTAZAPINE (REMERON) 30 MG TABLET    Take 30 mg by mouth at bedtime. One at bedtime   MULTIPLE VITAMINS-MINERALS (ICAPS PO)    Take by mouth. Take one daily for eyes   OMEGA-3 FATTY ACIDS (FISH OIL) 1000 MG CAPS    Take by mouth. Take one tablet daily   POLYETHYL GLYCOL-PROPYL GLYCOL (SYSTANE) 0.4-0.3 % SOLN    Apply to eye. One drop in both eyes as needed for dry eyes   POLYETHYLENE GLYCOL (MIRALAX / GLYCOLAX) PACKET    Take 17 g by mouth every 3 (three) days.    RESTASIS 0.05 % OPHTHALMIC EMULSION    One drop both eyes twice daily   TRAZODONE (DESYREL) 50 MG TABLET    Take 25 mg by mouth at bedtime as needed for sleep. Take 1/2 tablet at bedtime  Modified Medications   No medications on file  Discontinued Medications   No medications on file     Physical Exam: Physical Exam  Constitutional: She is oriented to person, place, and time. She appears well-developed and well-nourished. No distress.  HENT:  Head: Normocephalic and atraumatic.  Right Ear: External ear normal.  Left Ear: External ear normal.  Nose: Nose normal.  Mouth/Throat: Oropharynx is clear and moist. No oropharyngeal exudate.  Eyes: Conjunctivae and EOM are normal. Pupils are equal, round, and reactive to light. Right eye exhibits no  discharge. Left eye exhibits no discharge. No scleral icterus.  Neck: Normal range of motion. Neck supple. No JVD present. No tracheal deviation present. No thyromegaly present.  Cardiovascular: Normal rate, regular rhythm and intact distal pulses.  Exam reveals no gallop and no friction rub.   Murmur heard. 0-2/7 systolic murmur  Pulmonary/Chest: Effort normal and breath sounds normal. No stridor. No respiratory distress. She has no wheezes. She has no rales. She exhibits no tenderness.  Abdominal: Soft. Bowel sounds are normal. She exhibits no distension and no mass. There is no tenderness. There is no rebound and no guarding.  Prior exam: 2 small hemorrhoids at 11 am.   Genitourinary: Guaiac negative stool.  Musculoskeletal: Normal range of motion. She exhibits tenderness. She exhibits no edema.  R leg pain in groin and upper leg with weight bearing since fall 11/10/13-much improved.   Lymphadenopathy:    She has no cervical adenopathy.  Neurological: She is alert and oriented to person, place, and time. She has normal reflexes. No cranial nerve deficit. She exhibits abnormal muscle tone. Coordination normal.  Skin: Skin is warm. No rash noted. She is not diaphoretic. No erythema. No pallor.     Psychiatric: Her mood appears anxious. Her affect is angry. Her affect is not blunt, not labile and not inappropriate. Her speech is not delayed, not tangential and not slurred. She is not agitated, not aggressive, not hyperactive, not slowed, not withdrawn, not actively hallucinating and not combative. Thought content is paranoid. Thought content is not delusional. Cognition and memory are impaired. She does not express impulsivity or inappropriate judgment. She exhibits a depressed mood. She expresses no homicidal and no suicidal ideation. She expresses no suicidal plans and no homicidal plans. She is communicative. She exhibits abnormal recent memory and abnormal remote memory.  Stabilized in SNF She  is attentive.    Filed Vitals:   03/06/14 0949  BP: 126/50  Pulse: 81  Temp: 98.4 F (36.9 C)  TempSrc: Tympanic  Resp: 16      Labs reviewed: Basic Metabolic Panel:  Recent Labs  05/25/13 07/26/13 10/09/13 10/26/13 11/10/13 0701  NA 141 142 139  --  142  K 4.4 4.0 4.0  --  3.6*  CL  --   --   --   --  102  CO2  --   --   --   --  25  GLUCOSE  --   --   --   --  120*  BUN 57* 31* 37*  --  43*  CREATININE 1.7* 1.2* 1.2*  --  1.22*  CALCIUM  --   --   --   --  9.9  TSH 1.76  --   --  2.12  --    CBC:  Recent Labs  07/26/13 10/09/13 11/10/13 0701  WBC 4.0 3.9 11.6*  NEUTROABS  --   --  10.3*  HGB 9.5* 9.3* 10.9*  HCT 28* 28* 32.9*  MCV  --   --  95.1  PLT 208 179 154    Past Procedures:  10/24/12 EKG: sinus rhythm with marked sinus arrhythmia.   05/16/13 X-ray thoracic spine: mold compression fractures no evidence of a recent fracture. However, a subtle increase in an old fracture may be inapparent radio graphically.   11/10/13 X-ray R hip:  IMPRESSION: 1. Possible nondisplaced right inferior pubic ramus fracture. 2. No femur fracture or dislocation   11/10/13 CT head and cervical spine:  IMPRESSION: 1. No evidence of traumatic intracranial injury or fracture. 2. No evidence of fracture or subluxation along the cervical spine. 3. Soft tissue swelling at the right vertex. 4. Moderate cortical volume loss and scattered small vessel ischemic microangiopathy. 5. Mild degenerative change noted along the cervical spine. 6. Dense calcification and scarring at the lung apices. 7. Dense calcification noted at the left carotid bifurcation. Carotid ultrasound would be helpful for further evaluation, when and as deemed clinically appropriate.   Assessment/Plan Anemia of chronic disease 07/03/13 Hgb 8.9 07/03/13. Normal Iron 52, B12 641, Folate 15.5  07/26/13 Hgb 9.5 10/09/13 Hgb 9.3 11/10/13 Hgb 10.9     Chronic renal disease 07/03/13 Bun/creat 31/1.17.  Bun/creat 57/1.71 05/25/13. off Diovan/HCT.  10/09/13 Bun/creat 37/1.2   Constipation Stable, taking MiraLax q3 days      Depression with anxiety Crys at night, cannot return to sleep after bathroom trips(2-3x/night), stated she hates this place([facility]). She believes her son in law takes advantage of her money. Failed Zoloft, Mirtazapine, and Trazodone. Better Lexapro to stabilize her mood.  12/20/13 SNF [facility] stable.  02/06/14 mood is stable.  03/06/14 stable.    HTN (hypertension) Continue Novasc 53m and Bystolic 10mg  daily for Bp. Controlled. Off Diovan/HCT. Takes Atorvastatin and ASA for cardiovascular risk reduction.     Hypothyroidism Continue Levothyroxine 29mcg daily. TSH 1.76 05/25/13, 2.124 10/26/13       Family/ Staff Communication: observe the patient.   Goals of Care: SNF  Labs/tests ordered: none

## 2014-03-06 NOTE — Assessment & Plan Note (Signed)
Continue Novasc 46m and Bystolic 10mg  daily for Bp. Controlled. Off Diovan/HCT. Takes Atorvastatin and ASA for cardiovascular risk reduction.

## 2014-03-06 NOTE — Assessment & Plan Note (Signed)
07/03/13 Hgb 8.9 07/03/13. Normal Iron 52, B12 641, Folate 15.5  07/26/13 Hgb 9.5 10/09/13 Hgb 9.3 11/10/13 Hgb 10.9

## 2014-03-26 LAB — CBC AND DIFFERENTIAL
HEMATOCRIT: 33 % — AB (ref 36–46)
Hemoglobin: 10.9 g/dL — AB (ref 12.0–16.0)
Platelets: 210 10*3/uL (ref 150–399)
WBC: 4.9 10^3/mL

## 2014-03-26 LAB — HEPATIC FUNCTION PANEL
ALK PHOS: 79 U/L (ref 25–125)
ALT: 15 U/L (ref 7–35)
AST: 24 U/L (ref 13–35)
Bilirubin, Total: 0.4 mg/dL

## 2014-03-26 LAB — BASIC METABOLIC PANEL
BUN: 32 mg/dL — AB (ref 4–21)
Creatinine: 1.1 mg/dL (ref 0.5–1.1)
GLUCOSE: 79 mg/dL
POTASSIUM: 4.5 mmol/L (ref 3.4–5.3)
Sodium: 142 mmol/L (ref 137–147)

## 2014-03-27 ENCOUNTER — Other Ambulatory Visit: Payer: Self-pay | Admitting: Nurse Practitioner

## 2014-04-01 ENCOUNTER — Non-Acute Institutional Stay (SKILLED_NURSING_FACILITY): Payer: PPO | Admitting: Nurse Practitioner

## 2014-04-01 ENCOUNTER — Encounter: Payer: Self-pay | Admitting: Nurse Practitioner

## 2014-04-01 DIAGNOSIS — E039 Hypothyroidism, unspecified: Secondary | ICD-10-CM

## 2014-04-01 DIAGNOSIS — R350 Frequency of micturition: Secondary | ICD-10-CM

## 2014-04-01 DIAGNOSIS — S32591S Other specified fracture of right pubis, sequela: Secondary | ICD-10-CM | POA: Diagnosis not present

## 2014-04-01 DIAGNOSIS — I10 Essential (primary) hypertension: Secondary | ICD-10-CM

## 2014-04-01 DIAGNOSIS — K5900 Constipation, unspecified: Secondary | ICD-10-CM

## 2014-04-01 DIAGNOSIS — F418 Other specified anxiety disorders: Secondary | ICD-10-CM

## 2014-04-01 DIAGNOSIS — F0390 Unspecified dementia without behavioral disturbance: Secondary | ICD-10-CM

## 2014-04-01 NOTE — Assessment & Plan Note (Signed)
12/31/13 MMSE 11/30 MMSE 18/30 05/2013 per Dtr. May consider Namenda. --stable in SNF-takes Namenda 28mg  daily.

## 2014-04-01 NOTE — Assessment & Plan Note (Signed)
Crys at night, cannot return to sleep after bathroom trips(2-3x/night), stated she hates this place([facility]). She believes her son in law takes advantage of her money. Failed Zoloft, Mirtazapine, and Trazodone. Better Lexapro to stabilize her mood.  12/20/13 SNF [facility] stable.  --mood is stable.

## 2014-04-01 NOTE — Assessment & Plan Note (Signed)
11/10/13 s/p fall, ED eval  X-ray R hip  1. Possible nondisplaced right inferior pubic ramus fracture.  2. No femur fracture or dislocation 11/12/13 admitted to SNF-no longer self care sufficiency in AL setting. C/o R leg pain with weight bearing. Adding Norco 5/325mg  q6h prn for pain. May consider Ortho and MRI if no better   12/20/13 pain is much improved the right hip/groin/thigh area.  01/21/14 no c/o pain-consider healed clinically.  02/06/14 dc prn Norco/Motrin-not used. Tylenol 650mg  q6 prn available to her in addition to Tylenol 500mg  bid.  2/816 ambulates with walker, taking Tylenol bid.

## 2014-04-01 NOTE — Progress Notes (Signed)
Patient ID: Patricia Nelson, female   DOB: February 01, 1916, 81 y.o.   MRN: [id number]   Code Status: DNR. POA  Allergies  Allergen Reactions  . Ace Inhibitors     cough  . Mirtazapine     Nightmares   . Penicillins Rash    Chief Complaint  Patient presents with  . Medical Management of Chronic Issues    HPI: Patient is a 81 y.o. female seen in the SNF at [facility] today for management of  chronic medical conditions   ED eval: 11/10/13 sustained occipital laceration from fall at [place] [facility]. One staple closure noted and intact @ left occipital area. CT head and cervical spine unremarkable. C/o R leg pain worsens with weight bearing and no longer ambulating as prior since the fall. X-ray 1. Possible nondisplaced right inferior pubic ramus fracture.2. No femur fracture or dislocation    11/12/13 the patient was admitted to SNF for care needs since she is no longer self sufficiency in [place] setting.   Problem List Items Addressed This Visit    Urinary frequency    Chronic. Has been evaluated by Dr. Amalia Hailey. Wear dependents for urinary leakage.        Senile dementia    12/31/13 MMSE 11/30 MMSE 18/30 05/2013 per Dtr. May consider Namenda. --stable in SNF-takes Namenda 28mg  daily.         Hypothyroidism    Continue Levothyroxine 4mcg daily. TSH 1.76 05/25/13, 2.124 10/26/13        HTN (hypertension) - Primary    Continue Novasc 21m and Bystolic 10mg  daily for Bp. Controlled. Off Diovan/HCT. Takes Atorvastatin and ASA for cardiovascular risk reduction.         Depression with anxiety    Crys at night, cannot return to sleep after bathroom trips(2-3x/night), stated she hates this place([facility]). She believes her son in law takes advantage of her money. Failed Zoloft, Mirtazapine, and Trazodone. Better Lexapro to stabilize her mood.  12/20/13 SNF [facility] stable.  --mood is stable.      Constipation    Stable, taking MiraLax q3 days        Closed fracture of right inferior pubic  ramus    11/10/13 s/p fall, ED eval  X-ray R hip  1. Possible nondisplaced right inferior pubic ramus fracture.  2. No femur fracture or dislocation 11/12/13 admitted to SNF-no longer self care sufficiency in [place] setting. C/o R leg pain with weight bearing. Adding Norco 5/325mg  q6h prn for pain. May consider Ortho and MRI if no better   12/20/13 pain is much improved the right hip/groin/thigh area.  01/21/14 no c/o pain-consider healed clinically.  02/06/14 dc prn Norco/Motrin-not used. Tylenol 650mg  q6 prn available to her in addition to Tylenol 500mg  bid.  2/816 ambulates with walker, taking Tylenol bid.              Review of Systems:  Review of Systems  Constitutional: Negative for fever, chills, weight loss, malaise/fatigue and diaphoresis.  HENT: Positive for hearing loss. Negative for congestion, ear discharge, ear pain, nosebleeds, sore throat and tinnitus.   Eyes: Negative for blurred vision, double vision, photophobia, pain, discharge and redness.  Respiratory: Negative for cough, hemoptysis, sputum production, shortness of breath and stridor.   Cardiovascular: Negative for chest pain, palpitations, orthopnea, claudication, leg swelling and PND.  Gastrointestinal: Negative for heartburn, nausea, vomiting, abdominal pain, diarrhea, constipation, blood in stool and melena.  Genitourinary: Positive for frequency. Negative for dysuria, urgency, hematuria and flank pain.  Musculoskeletal: Positive for joint pain. Negative for myalgias, back pain, falls and neck pain.       Ambulates with walker.   Skin: Negative for itching and rash.  Neurological: Negative for dizziness, tingling, tremors, sensory change, speech change, focal weakness, seizures, loss of consciousness, weakness and headaches.  Endo/Heme/Allergies: Negative for environmental allergies and polydipsia. Does not bruise/bleed easily.  Psychiatric/Behavioral: Positive for depression and memory loss. Negative for  suicidal ideas, hallucinations and substance abuse. The patient is nervous/anxious and has insomnia.      Past Medical History  Diagnosis Date  . Arthritis   . Depression   . Cataract   . Thyroid disease   . Anxiety   . Osteoporosis   . Hypertension   . Atherosclerotic cerebrovascular disease   . Hyperlipidemia   . Pancreatitis   . Other sleep disturbances   . Backache, unspecified   . Hemorrhage of rectum and anus 03/26/2013  . Diseases of lips 03/26/2013  . Senile osteoporosis     with old T11 fracture  . Chronic kidney disease, unspecified   . Other malaise and fatigue   . Anorexia   . Insomnia, unspecified   . Other symptoms involving cardiovascular system   . Other specified cardiac dysrhythmias(427.89)   . Diaphragmatic hernia without mention of obstruction or gangrene   . Macular degeneration (senile) of retina, unspecified   . Personal history of other diseases of digestive system     pancreatitis from gallstones  . Cholelithiasis   . Unspecified disorders of arteries and arterioles   . Other specified personal history presenting hazards to health(V15.89)   . Personal history of other diseases of circulatory system   . Unspecified hemorrhoids without mention of complication   . Unspecified hearing loss   . Anemia, unspecified   . Rosacea   . Personal history of traumatic fracture   . Personal history of other diseases of respiratory system   . Diverticulosis of colon (without mention of hemorrhage)    Past Surgical History  Procedure Laterality Date  . Abdominal hysterectomy      and BSO fibroids  . Joint replacement  1996  . Colonoscopy  05/23/2007  . Total hip arthroplasty Right    Social History:   reports that she has never smoked. She does not have any smokeless tobacco history on file. She reports that she does not drink alcohol or use illicit drugs.  Family History  Problem Relation Age of Onset  . Heart disease Mother   . Diabetes Mother   . Heart  disease Father   . Diabetes Sister   . Diabetes Brother   . Diabetes Daughter     Medications: Patient's Medications  New Prescriptions   No medications on file  Previous Medications   ACETAMINOPHEN (TYLENOL) 325 MG TABLET    Take 650 mg by mouth every 6 (six) hours as needed.   AMLODIPINE (NORVASC) 10 MG TABLET    Take 10 mg by mouth daily.   ASCORBIC ACID (VITAMIN C) 1000 MG TABLET    Take 1,000 mg by mouth daily. Take one daily   ASPIRIN 81 MG TABLET    Take 81 mg by mouth daily.   ATORVASTATIN (LIPITOR) 10 MG TABLET    Take one tablet daily   BYSTOLIC 10 MG TABLET    Take one tablet daily for blood pressusre   CALCIUM CARBONATE-VITAMIN D (CALCIUM 600+D) 600-400 MG-UNIT PER TABLET    Take 1 tablet by mouth 2 (two) times daily.  CHOLECALCIFEROL (VITAMIN D) 1000 UNITS TABLET    Take 1,000 Units by mouth daily.   CLINDAMYCIN (CLEOCIN) 150 MG CAPSULE    Take 150 mg by mouth. 4 caps one hour prior dental   COENZYME Q10 (CO Q-10) 200 MG CAPS    Take 1 capsule by mouth daily. Take one daily   ENSURE PLUS (ENSURE PLUS) LIQD    Take 237 mLs by mouth 2 (two) times daily between meals.   ESCITALOPRAM (LEXAPRO) 10 MG TABLET    Take one tablet daily   GUAIFENESIN (HUMIBID E) 400 MG TABS TABLET    Take 400 mg by mouth. Every 4 hours as needed cough   LEVOTHYROXINE (SYNTHROID, LEVOTHROID) 25 MCG TABLET    Take 25 mcg by mouth daily.   MIRTAZAPINE (REMERON) 30 MG TABLET    Take 30 mg by mouth at bedtime. One at bedtime   MULTIPLE VITAMINS-MINERALS (ICAPS PO)    Take by mouth. Take one daily for eyes   OMEGA-3 FATTY ACIDS (FISH OIL) 1000 MG CAPS    Take by mouth. Take one tablet daily   POLYETHYL GLYCOL-PROPYL GLYCOL (SYSTANE) 0.4-0.3 % SOLN    Apply to eye. One drop in both eyes as needed for dry eyes   POLYETHYLENE GLYCOL (MIRALAX / GLYCOLAX) PACKET    Take 17 g by mouth every 3 (three) days.    RESTASIS 0.05 % OPHTHALMIC EMULSION    One drop both eyes twice daily   TRAZODONE (DESYREL) 50 MG  TABLET    Take 25 mg by mouth at bedtime as needed for sleep. Take 1/2 tablet at bedtime  Modified Medications   No medications on file  Discontinued Medications   No medications on file     Physical Exam: Physical Exam  Constitutional: She is oriented to person, place, and time. She appears well-developed and well-nourished. No distress.  HENT:  Head: Normocephalic and atraumatic.  Right Ear: External ear normal.  Left Ear: External ear normal.  Nose: Nose normal.  Mouth/Throat: Oropharynx is clear and moist. No oropharyngeal exudate.  Eyes: Conjunctivae and EOM are normal. Pupils are equal, round, and reactive to light. Right eye exhibits no discharge. Left eye exhibits no discharge. No scleral icterus.  Neck: Normal range of motion. Neck supple. No JVD present. No tracheal deviation present. No thyromegaly present.  Cardiovascular: Normal rate, regular rhythm and intact distal pulses.  Exam reveals no gallop and no friction rub.   Murmur heard. 4-6/2 systolic murmur  Pulmonary/Chest: Effort normal and breath sounds normal. No stridor. No respiratory distress. She has no wheezes. She has no rales. She exhibits no tenderness.  Abdominal: Soft. Bowel sounds are normal. She exhibits no distension and no mass. There is no tenderness. There is no rebound and no guarding.  Prior exam: 2 small hemorrhoids at 11 am.   Genitourinary: Guaiac negative stool.  Musculoskeletal: Normal range of motion. She exhibits tenderness. She exhibits no edema.  R leg pain in groin and upper leg with weight bearing since fall 11/10/13-much improved.   Lymphadenopathy:    She has no cervical adenopathy.  Neurological: She is alert and oriented to person, place, and time. She has normal reflexes. No cranial nerve deficit. She exhibits abnormal muscle tone. Coordination normal.  Skin: Skin is warm. No rash noted. She is not diaphoretic. No erythema. No pallor.     Psychiatric: Her mood appears anxious. Her  affect is angry. Her affect is not blunt, not labile and not inappropriate. Her speech  is not delayed, not tangential and not slurred. She is not agitated, not aggressive, not hyperactive, not slowed, not withdrawn, not actively hallucinating and not combative. Thought content is paranoid. Thought content is not delusional. Cognition and memory are impaired. She does not express impulsivity or inappropriate judgment. She exhibits a depressed mood. She expresses no homicidal and no suicidal ideation. She expresses no suicidal plans and no homicidal plans. She is communicative. She exhibits abnormal recent memory and abnormal remote memory.  Stabilized in SNF She is attentive.    Filed Vitals:   04/01/14 1458  BP: 140/70  Pulse: 78  Temp: 98 F (36.7 C)  TempSrc: Tympanic  Resp: 18      Labs reviewed: Basic Metabolic Panel:  Recent Labs  05/25/13  10/09/13 10/26/13 11/10/13 0701 03/26/14  NA 141  < > 139  --  142 142  K 4.4  < > 4.0  --  3.6* 4.5  CL  --   --   --   --  102  --   CO2  --   --   --   --  25  --   GLUCOSE  --   --   --   --  120*  --   BUN 57*  < > 37*  --  43* 32*  CREATININE 1.7*  < > 1.2*  --  1.22* 1.1  CALCIUM  --   --   --   --  9.9  --   TSH 1.76  --   --  2.12  --   --   < > = values in this interval not displayed. CBC:  Recent Labs  10/09/13 11/10/13 0701 03/26/14  WBC 3.9 11.6* 4.9  NEUTROABS  --  10.3*  --   HGB 9.3* 10.9* 10.9*  HCT 28* 32.9* 33*  MCV  --  95.1  --   PLT 179 154 210    Past Procedures:  10/24/12 EKG: sinus rhythm with marked sinus arrhythmia.   05/16/13 X-ray thoracic spine: mold compression fractures no evidence of a recent fracture. However, a subtle increase in an old fracture may be inapparent radio graphically.   11/10/13 X-ray R hip:  IMPRESSION: 1. Possible nondisplaced right inferior pubic ramus fracture. 2. No femur fracture or dislocation   11/10/13 CT head and cervical spine:  IMPRESSION: 1. No evidence of  traumatic intracranial injury or fracture. 2. No evidence of fracture or subluxation along the cervical spine. 3. Soft tissue swelling at the right vertex. 4. Moderate cortical volume loss and scattered small vessel ischemic microangiopathy. 5. Mild degenerative change noted along the cervical spine. 6. Dense calcification and scarring at the lung apices. 7. Dense calcification noted at the left carotid bifurcation. Carotid ultrasound would be helpful for further evaluation, when and as deemed clinically appropriate.   Assessment/Plan Urinary frequency Chronic. Has been evaluated by Dr. Amalia Hailey. Wear dependents for urinary leakage.     Constipation Stable, taking MiraLax q3 days     Hypothyroidism Continue Levothyroxine 41mcg daily. TSH 1.76 05/25/13, 2.124 10/26/13     HTN (hypertension) Continue Novasc 26m and Bystolic 10mg  daily for Bp. Controlled. Off Diovan/HCT. Takes Atorvastatin and ASA for cardiovascular risk reduction.      Depression with anxiety Crys at night, cannot return to sleep after bathroom trips(2-3x/night), stated she hates this place([facility]). She believes her son in law takes advantage of her money. Failed Zoloft, Mirtazapine, and Trazodone. Better Lexapro to stabilize her mood.  12/20/13 SNF  [facility] stable.  --mood is stable.   Senile dementia 12/31/13 MMSE 11/30 MMSE 18/30 05/2013 per Dtr. May consider Namenda. --stable in SNF-takes Namenda 28mg  daily.      Closed fracture of right inferior pubic ramus 11/10/13 s/p fall, ED eval  X-ray R hip  1. Possible nondisplaced right inferior pubic ramus fracture.  2. No femur fracture or dislocation 11/12/13 admitted to SNF-no longer self care sufficiency in AL setting. C/o R leg pain with weight bearing. Adding Norco 5/325mg  q6h prn for pain. May consider Ortho and MRI if no better   12/20/13 pain is much improved the right hip/groin/thigh area.  01/21/14 no c/o pain-consider healed clinically.  02/06/14 dc prn  Norco/Motrin-not used. Tylenol 650mg  q6 prn available to her in addition to Tylenol 500mg  bid.  2/816 ambulates with walker, taking Tylenol bid.          Family/ Staff Communication: observe the patient.   Goals of Care: SNF  Labs/tests ordered: none

## 2014-04-01 NOTE — Assessment & Plan Note (Signed)
Chronic. Has been evaluated by Dr. Amalia Hailey. Wear dependents for urinary leakage.

## 2014-04-01 NOTE — Assessment & Plan Note (Signed)
Stable, taking MiraLax q3 days

## 2014-04-01 NOTE — Assessment & Plan Note (Signed)
Continue Levothyroxine 89mcg daily. TSH 1.76 05/25/13, 2.124 10/26/13

## 2014-04-01 NOTE — Assessment & Plan Note (Signed)
Continue Novasc 76m and Bystolic 10mg  daily for Bp. Controlled. Off Diovan/HCT. Takes Atorvastatin and ASA for cardiovascular risk reduction.

## 2014-04-29 ENCOUNTER — Non-Acute Institutional Stay (SKILLED_NURSING_FACILITY): Payer: PPO | Admitting: Nurse Practitioner

## 2014-04-29 ENCOUNTER — Encounter: Payer: Self-pay | Admitting: Nurse Practitioner

## 2014-04-29 DIAGNOSIS — S32591S Other specified fracture of right pubis, sequela: Secondary | ICD-10-CM | POA: Diagnosis not present

## 2014-04-29 DIAGNOSIS — E039 Hypothyroidism, unspecified: Secondary | ICD-10-CM | POA: Diagnosis not present

## 2014-04-29 DIAGNOSIS — F0390 Unspecified dementia without behavioral disturbance: Secondary | ICD-10-CM

## 2014-04-29 DIAGNOSIS — K5900 Constipation, unspecified: Secondary | ICD-10-CM | POA: Diagnosis not present

## 2014-04-29 DIAGNOSIS — R350 Frequency of micturition: Secondary | ICD-10-CM | POA: Diagnosis not present

## 2014-04-29 DIAGNOSIS — K449 Diaphragmatic hernia without obstruction or gangrene: Secondary | ICD-10-CM

## 2014-04-29 DIAGNOSIS — I10 Essential (primary) hypertension: Secondary | ICD-10-CM | POA: Diagnosis not present

## 2014-04-29 DIAGNOSIS — F418 Other specified anxiety disorders: Secondary | ICD-10-CM | POA: Diagnosis not present

## 2014-04-29 NOTE — Assessment & Plan Note (Signed)
12/31/13 MMSE 11/30 MMSE 18/30 05/2013 per Dtr. May consider Namenda. --stable in SNF-takes Namenda 28mg  daily.

## 2014-04-29 NOTE — Assessment & Plan Note (Signed)
Chronic. Has been evaluated by Dr. Amalia Hailey. Wear dependents for urinary leakage.

## 2014-04-29 NOTE — Assessment & Plan Note (Signed)
Stable, taking MiraLax q3 days

## 2014-04-29 NOTE — Assessment & Plan Note (Signed)
Crys at night, cannot return to sleep after bathroom trips(2-3x/night), stated she hates this place([facility]). She believes her son in law takes advantage of her money. Failed Zoloft, Mirtazapine, and Trazodone. Better Lexapro to stabilize her mood.  12/20/13 SNF [facility] stable.  --mood is stable

## 2014-04-29 NOTE — Assessment & Plan Note (Signed)
Continue Novasc 76m and Bystolic 10mg  daily for Bp. Controlled. Off Diovan/HCT. Takes Atorvastatin and ASA for cardiovascular risk reduction.

## 2014-04-29 NOTE — Assessment & Plan Note (Signed)
Stable

## 2014-04-29 NOTE — Assessment & Plan Note (Signed)
11/10/13 s/p fall, ED eval  X-ray R hip  1. Possible nondisplaced right inferior pubic ramus fracture.  2. No femur fracture or dislocation 11/12/13 admitted to SNF-no longer self care sufficiency in [place] setting. C/o R leg pain with weight bearing. Adding Norco 5/325mg  q6h prn for pain. May consider Ortho and MRI if no better   12/20/13 pain is much improved the right hip/groin/thigh area.  01/21/14 no c/o pain-consider healed clinically.  02/06/14 dc prn Norco/Motrin-not used. Tylenol 650mg  q6 prn available to her in addition to Tylenol 500mg  bid.  ---ambulates with walker, taking Tylenol bid.

## 2014-04-29 NOTE — Progress Notes (Signed)
Patient ID: Patricia Nelson, female   DOB: May 15, 1915, 81 y.o.   MRN: [id number]   Code Status: DNR  Allergies  Allergen Reactions  . Ace Inhibitors     cough  . Mirtazapine     Nightmares   . Penicillins Rash    Chief Complaint  Patient presents with  . Medical Management of Chronic Issues    HPI: Patient is a 81 y.o. female seen in the SNF at [facility] today for evaluation of chronic medical conditions.  Problem List Items Addressed This Visit    Urinary frequency    Chronic. Has been evaluated by Dr. Amalia Hailey. Wear dependents for urinary leakage.         Senile dementia    12/31/13 MMSE 11/30 MMSE 18/30 05/2013 per Dtr. May consider Namenda. --stable in SNF-takes Namenda 28mg  daily.        Hypothyroidism    Continue Levothyroxine 48mcg daily. TSH 1.76 05/25/13, 2.124 10/26/13       HTN (hypertension)    Continue Novasc 42m and Bystolic 10mg  daily for Bp. Controlled. Off Diovan/HCT. Takes Atorvastatin and ASA for cardiovascular risk reduction.          Hiatal hernia    Stable.       Depression with anxiety    Crys at night, cannot return to sleep after bathroom trips(2-3x/night), stated she hates this place([facility]). She believes her son in law takes advantage of her money. Failed Zoloft, Mirtazapine, and Trazodone. Better Lexapro to stabilize her mood.  12/20/13 SNF [facility] stable.  --mood is stable      Constipation - Primary    Stable, taking MiraLax q3 days       Closed fracture of right inferior pubic ramus    11/10/13 s/p fall, ED eval  X-ray R hip  1. Possible nondisplaced right inferior pubic ramus fracture.  2. No femur fracture or dislocation 11/12/13 admitted to SNF-no longer self care sufficiency in AL setting. C/o R leg pain with weight bearing. Adding Norco 5/325mg  q6h prn for pain. May consider Ortho and MRI if no better   12/20/13 pain is much improved the right hip/groin/thigh area.  01/21/14 no c/o pain-consider healed clinically.    02/06/14 dc prn Norco/Motrin-not used. Tylenol 650mg  q6 prn available to her in addition to Tylenol 500mg  bid.  ---ambulates with walker, taking Tylenol bid.            Review of Systems:  Review of Systems  Constitutional: Negative for fever, chills and diaphoresis.  HENT: Positive for hearing loss. Negative for congestion, ear discharge, ear pain, nosebleeds, sore throat and tinnitus.   Eyes: Negative for photophobia, pain, discharge and redness.  Respiratory: Negative for cough, shortness of breath and stridor.   Cardiovascular: Negative for chest pain, palpitations and leg swelling.  Gastrointestinal: Negative for nausea, vomiting, abdominal pain, diarrhea, constipation and blood in stool.  Endocrine: Negative for polydipsia.  Genitourinary: Positive for frequency. Negative for dysuria, urgency, hematuria and flank pain.  Musculoskeletal: Negative for myalgias, back pain and neck pain.       Ambulates with walker.   Skin: Negative for rash.  Allergic/Immunologic: Negative for environmental allergies.  Neurological: Negative for dizziness, tremors, seizures, weakness and headaches.  Hematological: Does not bruise/bleed easily.  Psychiatric/Behavioral: Negative for suicidal ideas and hallucinations. The patient is nervous/anxious.      Past Medical History  Diagnosis Date  . Arthritis   . Depression   . Cataract   . Thyroid disease   .  Anxiety   . Osteoporosis   . Hypertension   . Atherosclerotic cerebrovascular disease   . Hyperlipidemia   . Pancreatitis   . Other sleep disturbances   . Backache, unspecified   . Hemorrhage of rectum and anus 03/26/2013  . Diseases of lips 03/26/2013  . Senile osteoporosis     with old T11 fracture  . Chronic kidney disease, unspecified   . Other malaise and fatigue   . Anorexia   . Insomnia, unspecified   . Other symptoms involving cardiovascular system   . Other specified cardiac dysrhythmias(427.89)   . Diaphragmatic hernia  without mention of obstruction or gangrene   . Macular degeneration (senile) of retina, unspecified   . Personal history of other diseases of digestive system     pancreatitis from gallstones  . Cholelithiasis   . Unspecified disorders of arteries and arterioles   . Other specified personal history presenting hazards to health(V15.89)   . Personal history of other diseases of circulatory system   . Unspecified hemorrhoids without mention of complication   . Unspecified hearing loss   . Anemia, unspecified   . Rosacea   . Personal history of traumatic fracture   . Personal history of other diseases of respiratory system   . Diverticulosis of colon (without mention of hemorrhage)    Past Surgical History  Procedure Laterality Date  . Abdominal hysterectomy      and BSO fibroids  . Joint replacement  1996  . Colonoscopy  05/23/2007  . Total hip arthroplasty Right    Social History:   reports that she has never smoked. She does not have any smokeless tobacco history on file. She reports that she does not drink alcohol or use illicit drugs.  Family History  Problem Relation Age of Onset  . Heart disease Mother   . Diabetes Mother   . Heart disease Father   . Diabetes Sister   . Diabetes Brother   . Diabetes Daughter     Medications: Patient's Medications  New Prescriptions   No medications on file  Previous Medications   ACETAMINOPHEN (TYLENOL) 325 MG TABLET    Take 650 mg by mouth every 6 (six) hours as needed.   AMLODIPINE (NORVASC) 10 MG TABLET    Take 10 mg by mouth daily.   ASCORBIC ACID (VITAMIN C) 1000 MG TABLET    Take 1,000 mg by mouth daily. Take one daily   ASPIRIN 81 MG TABLET    Take 81 mg by mouth daily.   ATORVASTATIN (LIPITOR) 10 MG TABLET    Take one tablet daily   BYSTOLIC 10 MG TABLET    Take one tablet daily for blood pressusre   CALCIUM CARBONATE-VITAMIN D (CALCIUM 600+D) 600-400 MG-UNIT PER TABLET    Take 1 tablet by mouth 2 (two) times daily.     CHOLECALCIFEROL (VITAMIN D) 1000 UNITS TABLET    Take 1,000 Units by mouth daily.   CLINDAMYCIN (CLEOCIN) 150 MG CAPSULE    Take 150 mg by mouth. 4 caps one hour prior dental   COENZYME Q10 (CO Q-10) 200 MG CAPS    Take 1 capsule by mouth daily. Take one daily   ENSURE PLUS (ENSURE PLUS) LIQD    Take 237 mLs by mouth 2 (two) times daily between meals.   ESCITALOPRAM (LEXAPRO) 10 MG TABLET    Take one tablet daily   GUAIFENESIN (HUMIBID E) 400 MG TABS TABLET    Take 400 mg by mouth. Every 4 hours as  needed cough   LEVOTHYROXINE (SYNTHROID, LEVOTHROID) 25 MCG TABLET    Take 25 mcg by mouth daily.   MIRTAZAPINE (REMERON) 30 MG TABLET    Take 30 mg by mouth at bedtime. One at bedtime   MULTIPLE VITAMINS-MINERALS (ICAPS PO)    Take by mouth. Take one daily for eyes   OMEGA-3 FATTY ACIDS (FISH OIL) 1000 MG CAPS    Take by mouth. Take one tablet daily   POLYETHYL GLYCOL-PROPYL GLYCOL (SYSTANE) 0.4-0.3 % SOLN    Apply to eye. One drop in both eyes as needed for dry eyes   POLYETHYLENE GLYCOL (MIRALAX / GLYCOLAX) PACKET    Take 17 g by mouth every 3 (three) days.    RESTASIS 0.05 % OPHTHALMIC EMULSION    One drop both eyes twice daily   TRAZODONE (DESYREL) 50 MG TABLET    Take 25 mg by mouth at bedtime as needed for sleep. Take 1/2 tablet at bedtime  Modified Medications   No medications on file  Discontinued Medications   No medications on file     Physical Exam: Physical Exam  Constitutional: She is oriented to person, place, and time. She appears well-developed and well-nourished. No distress.  HENT:  Head: Normocephalic and atraumatic.  Right Ear: External ear normal.  Left Ear: External ear normal.  Nose: Nose normal.  Mouth/Throat: Oropharynx is clear and moist. No oropharyngeal exudate.  Eyes: Conjunctivae and EOM are normal. Pupils are equal, round, and reactive to light. Right eye exhibits no discharge. Left eye exhibits no discharge. No scleral icterus.  Neck: Normal range of motion.  Neck supple. No JVD present. No tracheal deviation present. No thyromegaly present.  Cardiovascular: Normal rate, regular rhythm and intact distal pulses.  Exam reveals no gallop and no friction rub.   Murmur heard. 6-2/3 systolic murmur  Pulmonary/Chest: Effort normal and breath sounds normal. No stridor. No respiratory distress. She has no wheezes. She has no rales. She exhibits no tenderness.  Abdominal: Soft. Bowel sounds are normal. She exhibits no distension and no mass. There is no tenderness. There is no rebound and no guarding.  Prior exam: 2 small hemorrhoids at 11 am.   Genitourinary: Guaiac negative stool.  Musculoskeletal: Normal range of motion. She exhibits tenderness. She exhibits no edema.  R leg pain in groin and upper leg with weight bearing since fall 11/10/13-much improved.   Lymphadenopathy:    She has no cervical adenopathy.  Neurological: She is alert and oriented to person, place, and time. She has normal reflexes. No cranial nerve deficit. She exhibits abnormal muscle tone. Coordination normal.  Skin: Skin is warm. No rash noted. She is not diaphoretic. No erythema. No pallor.     Psychiatric: Her mood appears anxious. Her affect is angry. Her affect is not blunt, not labile and not inappropriate. Her speech is not delayed, not tangential and not slurred. She is not agitated, not aggressive, not hyperactive, not slowed, not withdrawn, not actively hallucinating and not combative. Thought content is paranoid. Thought content is not delusional. Cognition and memory are impaired. She does not express impulsivity or inappropriate judgment. She exhibits a depressed mood. She expresses no homicidal and no suicidal ideation. She expresses no suicidal plans and no homicidal plans. She is communicative. She exhibits abnormal recent memory and abnormal remote memory.  Stabilized in SNF She is attentive.    Filed Vitals:   04/29/14 1605  BP: 130/80  Pulse: 68  Temp: 98.4 F  (36.9 C)  TempSrc: Tympanic  Resp: 16      Labs reviewed: Basic Metabolic Panel:  Recent Labs  05/25/13  10/09/13 10/26/13 11/10/13 0701 03/26/14  NA 141  < > 139  --  142 142  K 4.4  < > 4.0  --  3.6* 4.5  CL  --   --   --   --  102  --   CO2  --   --   --   --  25  --   GLUCOSE  --   --   --   --  120*  --   BUN 57*  < > 37*  --  43* 32*  CREATININE 1.7*  < > 1.2*  --  1.22* 1.1  CALCIUM  --   --   --   --  9.9  --   TSH 1.76  --   --  2.12  --   --   < > = values in this interval not displayed. Liver Function Tests:  Recent Labs  11/10/13 0701 03/26/14  AST 32 24  ALT 22 15  ALKPHOS 64 79  BILITOT 0.6  --   PROT 6.6  --   ALBUMIN 3.4*  --    No results for input(s): LIPASE, AMYLASE in the last 8760 hours. No results for input(s): AMMONIA in the last 8760 hours. CBC:  Recent Labs  10/09/13 11/10/13 0701 03/26/14  WBC 3.9 11.6* 4.9  NEUTROABS  --  10.3*  --   HGB 9.3* 10.9* 10.9*  HCT 28* 32.9* 33*  MCV  --  95.1  --   PLT 179 154 210   Lipid Panel: No results for input(s): CHOL, HDL, LDLCALC, TRIG, CHOLHDL, LDLDIRECT in the last 8760 hours.  Past Procedures:  10/24/12 EKG: sinus rhythm with marked sinus arrhythmia.   05/16/13 X-ray thoracic spine: mold compression fractures no evidence of a recent fracture. However, a subtle increase in an old fracture may be inapparent radio graphically.   11/10/13 X-ray R hip:  IMPRESSION: 1. Possible nondisplaced right inferior pubic ramus fracture. 2. No femur fracture or dislocation  11/10/13 CT head and cervical spine:  IMPRESSION: 1. No evidence of traumatic intracranial injury or fracture. 2. No evidence of fracture or subluxation along the cervical spine. 3. Soft tissue swelling at the right vertex. 4. Moderate cortical volume loss and scattered small vessel ischemic microangiopathy. 5. Mild degenerative change noted along the cervical spine. 6. Dense calcification and scarring at the lung apices. 7.  Dense calcification noted at the left carotid bifurcation. Carotid ultrasound would be helpful for further evaluation, when and as deemed clinically appropriate.   Assessment/Plan Constipation Stable, taking MiraLax q3 days    Hypothyroidism Continue Levothyroxine 4mcg daily. TSH 1.76 05/25/13, 2.124 10/26/13    HTN (hypertension) Continue Novasc 29m and Bystolic 10mg  daily for Bp. Controlled. Off Diovan/HCT. Takes Atorvastatin and ASA for cardiovascular risk reduction.       Depression with anxiety Crys at night, cannot return to sleep after bathroom trips(2-3x/night), stated she hates this place([facility]). She believes her son in law takes advantage of her money. Failed Zoloft, Mirtazapine, and Trazodone. Better Lexapro to stabilize her mood.  12/20/13 SNF [facility] stable.  --mood is stable   Senile dementia 12/31/13 MMSE 11/30 MMSE 18/30 05/2013 per Dtr. May consider Namenda. --stable in SNF-takes Namenda 28mg  daily.     Urinary frequency Chronic. Has been evaluated by Dr. Amalia Hailey. Wear dependents for urinary leakage.      Closed fracture of right  inferior pubic ramus 11/10/13 s/p fall, ED eval  X-ray R hip  1. Possible nondisplaced right inferior pubic ramus fracture.  2. No femur fracture or dislocation 11/12/13 admitted to SNF-no longer self care sufficiency in AL setting. C/o R leg pain with weight bearing. Adding Norco 5/325mg  q6h prn for pain. May consider Ortho and MRI if no better   12/20/13 pain is much improved the right hip/groin/thigh area.  01/21/14 no c/o pain-consider healed clinically.  02/06/14 dc prn Norco/Motrin-not used. Tylenol 650mg  q6 prn available to her in addition to Tylenol 500mg  bid.  ---ambulates with walker, taking Tylenol bid.      Hiatal hernia Stable.      Family/ Staff Communication: observe the patient  Goals of Care: SNF  Labs/tests ordered: none

## 2014-04-29 NOTE — Assessment & Plan Note (Signed)
Continue Levothyroxine 11mcg daily. TSH 1.76 05/25/13, 2.124 10/26/13

## 2014-05-29 ENCOUNTER — Encounter: Payer: Self-pay | Admitting: Nurse Practitioner

## 2014-05-29 ENCOUNTER — Non-Acute Institutional Stay (SKILLED_NURSING_FACILITY): Payer: PPO | Admitting: Nurse Practitioner

## 2014-05-29 DIAGNOSIS — I10 Essential (primary) hypertension: Secondary | ICD-10-CM | POA: Diagnosis not present

## 2014-05-29 DIAGNOSIS — E039 Hypothyroidism, unspecified: Secondary | ICD-10-CM

## 2014-05-29 DIAGNOSIS — K5900 Constipation, unspecified: Secondary | ICD-10-CM

## 2014-05-29 DIAGNOSIS — S32591S Other specified fracture of right pubis, sequela: Secondary | ICD-10-CM | POA: Diagnosis not present

## 2014-05-29 DIAGNOSIS — F0390 Unspecified dementia without behavioral disturbance: Secondary | ICD-10-CM

## 2014-05-29 DIAGNOSIS — D638 Anemia in other chronic diseases classified elsewhere: Secondary | ICD-10-CM

## 2014-05-29 DIAGNOSIS — F418 Other specified anxiety disorders: Secondary | ICD-10-CM

## 2014-05-29 NOTE — Assessment & Plan Note (Signed)
Continue Levothyroxine 24mcg daily. TSH 1.76 05/25/13, 2.124 10/26/13

## 2014-05-29 NOTE — Assessment & Plan Note (Signed)
Crys at night, cannot return to sleep after bathroom trips(2-3x/night), stated she hates this place([facility]). She believes her son in law takes advantage of her money. Failed Zoloft, Mirtazapine, and Trazodone. Better Lexapro to stabilize her mood.  12/20/13 SNF [facility] stable.  --mood is stable

## 2014-05-29 NOTE — Assessment & Plan Note (Signed)
Stable, taking MiraLax q3 days

## 2014-05-29 NOTE — Assessment & Plan Note (Signed)
11/10/13 s/p fall, ED eval  X-ray R hip  1. Possible nondisplaced right inferior pubic ramus fracture.  2. No femur fracture or dislocation 11/12/13 admitted to SNF-no longer self care sufficiency in [place] setting. C/o R leg pain with weight bearing. Adding Norco 5/325mg  q6h prn for pain. May consider Ortho and MRI if no better   12/20/13 pain is much improved the right hip/groin/thigh area.  01/21/14 no c/o pain-consider healed clinically.  02/06/14 dc prn Norco/Motrin-not used. Tylenol 650mg  q6 prn available to her in addition to Tylenol 500mg  bid.  ---ambulates with walker, taking Tylenol bid.

## 2014-05-29 NOTE — Assessment & Plan Note (Signed)
07/03/13 Hgb 8.9 07/03/13. Normal Iron 52, B12 641, Folate 15.5  07/26/13 Hgb 9.5 10/09/13 Hgb 9.3 11/10/13 Hgb 10.9

## 2014-05-29 NOTE — Assessment & Plan Note (Signed)
Continue Novasc 58m and Bystolic 10mg  daily for Bp. Controlled. Off Diovan/HCT. Takes Atorvastatin and ASA for cardiovascular risk reduction.

## 2014-05-29 NOTE — Assessment & Plan Note (Signed)
12/31/13 MMSE 11/30 MMSE 18/30 05/2013 per Dtr. May consider Namenda. --stable in SNF-takes Namenda 28mg  daily.

## 2014-05-29 NOTE — Progress Notes (Signed)
Patient ID: Patricia Nelson, female   DOB: Oct 30, 1915, 81 y.o.   MRN: [id number]   Code Status: DNR  Allergies  Allergen Reactions  . Ace Inhibitors     cough  . Mirtazapine     Nightmares   . Penicillins Rash    Chief Complaint  Patient presents with  . Medical Management of Chronic Issues    HPI: Patient is a 81 y.o. female seen in the SNF at [facility] today for evaluation of chronic medical conditions.  Problem List Items Addressed This Visit    Anemia of chronic disease    07/03/13 Hgb 8.9 07/03/13. Normal Iron 52, B12 641, Folate 15.5  07/26/13 Hgb 9.5 10/09/13 Hgb 9.3 11/10/13 Hgb 10.9      Closed fracture of right inferior pubic ramus    11/10/13 s/p fall, ED eval  X-ray R hip  1. Possible nondisplaced right inferior pubic ramus fracture.  2. No femur fracture or dislocation 11/12/13 admitted to SNF-no longer self care sufficiency in AL setting. C/o R leg pain with weight bearing. Adding Norco 5/325mg  q6h prn for pain. May consider Ortho and MRI if no better   12/20/13 pain is much improved the right hip/groin/thigh area.  01/21/14 no c/o pain-consider healed clinically.  02/06/14 dc prn Norco/Motrin-not used. Tylenol 650mg  q6 prn available to her in addition to Tylenol 500mg  bid.  ---ambulates with walker, taking Tylenol bid.        Constipation    Stable, taking MiraLax q3 days       Depression with anxiety    Crys at night, cannot return to sleep after bathroom trips(2-3x/night), stated she hates this place([facility]). She believes her son in law takes advantage of her money. Failed Zoloft, Mirtazapine, and Trazodone. Better Lexapro to stabilize her mood.  12/20/13 SNF [facility] stable.  --mood is stable       HTN (hypertension) - Primary    Continue Novasc 32m and Bystolic 10mg  daily for Bp. Controlled. Off Diovan/HCT. Takes Atorvastatin and ASA for cardiovascular risk reduction.        Hypothyroidism    Continue Levothyroxine 41mcg daily. TSH 1.76  05/25/13, 2.124 10/26/13       Senile dementia    12/31/13 MMSE 11/30 MMSE 18/30 05/2013 per Dtr. May consider Namenda. --stable in SNF-takes Namenda 28mg  daily.            Review of Systems:  Review of Systems  Constitutional: Negative for fever, chills and diaphoresis.  HENT: Positive for hearing loss. Negative for congestion, ear discharge, ear pain, nosebleeds, sore throat and tinnitus.   Eyes: Negative for photophobia, pain, discharge and redness.  Respiratory: Negative for cough, shortness of breath and stridor.   Cardiovascular: Negative for chest pain, palpitations and leg swelling.  Gastrointestinal: Negative for nausea, vomiting, abdominal pain, diarrhea, constipation and blood in stool.  Endocrine: Negative for polydipsia.  Genitourinary: Positive for frequency. Negative for dysuria, urgency, hematuria and flank pain.  Musculoskeletal: Negative for myalgias, back pain and neck pain.       Ambulates with walker.   Skin: Negative for rash.  Allergic/Immunologic: Negative for environmental allergies.  Neurological: Negative for dizziness, tremors, seizures, weakness and headaches.  Hematological: Does not bruise/bleed easily.  Psychiatric/Behavioral: Negative for suicidal ideas and hallucinations. The patient is nervous/anxious.      Past Medical History  Diagnosis Date  . Arthritis   . Depression   . Cataract   . Thyroid disease   . Anxiety   .  Osteoporosis   . Hypertension   . Atherosclerotic cerebrovascular disease   . Hyperlipidemia   . Pancreatitis   . Other sleep disturbances   . Backache, unspecified   . Hemorrhage of rectum and anus 03/26/2013  . Diseases of lips 03/26/2013  . Senile osteoporosis     with old T11 fracture  . Chronic kidney disease, unspecified   . Other malaise and fatigue   . Anorexia   . Insomnia, unspecified   . Other symptoms involving cardiovascular system   . Other specified cardiac dysrhythmias(427.89)   . Diaphragmatic hernia  without mention of obstruction or gangrene   . Macular degeneration (senile) of retina, unspecified   . Personal history of other diseases of digestive system     pancreatitis from gallstones  . Cholelithiasis   . Unspecified disorders of arteries and arterioles   . Other specified personal history presenting hazards to health(V15.89)   . Personal history of other diseases of circulatory system   . Unspecified hemorrhoids without mention of complication   . Unspecified hearing loss   . Anemia, unspecified   . Rosacea   . Personal history of traumatic fracture   . Personal history of other diseases of respiratory system   . Diverticulosis of colon (without mention of hemorrhage)    Past Surgical History  Procedure Laterality Date  . Abdominal hysterectomy      and BSO fibroids  . Joint replacement  1996  . Colonoscopy  05/23/2007  . Total hip arthroplasty Right    Social History:   reports that she has never smoked. She does not have any smokeless tobacco history on file. She reports that she does not drink alcohol or use illicit drugs.  Family History  Problem Relation Age of Onset  . Heart disease Mother   . Diabetes Mother   . Heart disease Father   . Diabetes Sister   . Diabetes Brother   . Diabetes Daughter     Medications: Patient's Medications  New Prescriptions   No medications on file  Previous Medications   ACETAMINOPHEN (TYLENOL) 325 MG TABLET    Take 650 mg by mouth every 6 (six) hours as needed.   AMLODIPINE (NORVASC) 10 MG TABLET    Take 10 mg by mouth daily.   ASCORBIC ACID (VITAMIN C) 1000 MG TABLET    Take 1,000 mg by mouth daily. Take one daily   ASPIRIN 81 MG TABLET    Take 81 mg by mouth daily.   ATORVASTATIN (LIPITOR) 10 MG TABLET    Take one tablet daily   BYSTOLIC 10 MG TABLET    Take one tablet daily for blood pressusre   CALCIUM CARBONATE-VITAMIN D (CALCIUM 600+D) 600-400 MG-UNIT PER TABLET    Take 1 tablet by mouth 2 (two) times daily.     CHOLECALCIFEROL (VITAMIN D) 1000 UNITS TABLET    Take 1,000 Units by mouth daily.   CLINDAMYCIN (CLEOCIN) 150 MG CAPSULE    Take 150 mg by mouth. 4 caps one hour prior dental   COENZYME Q10 (CO Q-10) 200 MG CAPS    Take 1 capsule by mouth daily. Take one daily   ENSURE PLUS (ENSURE PLUS) LIQD    Take 237 mLs by mouth 2 (two) times daily between meals.   ESCITALOPRAM (LEXAPRO) 10 MG TABLET    Take one tablet daily   GUAIFENESIN (HUMIBID E) 400 MG TABS TABLET    Take 400 mg by mouth. Every 4 hours as needed cough  LEVOTHYROXINE (SYNTHROID, LEVOTHROID) 25 MCG TABLET    Take 25 mcg by mouth daily.   MIRTAZAPINE (REMERON) 30 MG TABLET    Take 30 mg by mouth at bedtime. One at bedtime   MULTIPLE VITAMINS-MINERALS (ICAPS PO)    Take by mouth. Take one daily for eyes   OMEGA-3 FATTY ACIDS (FISH OIL) 1000 MG CAPS    Take by mouth. Take one tablet daily   POLYETHYL GLYCOL-PROPYL GLYCOL (SYSTANE) 0.4-0.3 % SOLN    Apply to eye. One drop in both eyes as needed for dry eyes   POLYETHYLENE GLYCOL (MIRALAX / GLYCOLAX) PACKET    Take 17 g by mouth every 3 (three) days.    RESTASIS 0.05 % OPHTHALMIC EMULSION    One drop both eyes twice daily   TRAZODONE (DESYREL) 50 MG TABLET    Take 25 mg by mouth at bedtime as needed for sleep. Take 1/2 tablet at bedtime  Modified Medications   No medications on file  Discontinued Medications   No medications on file     Physical Exam: Physical Exam  Constitutional: She is oriented to person, place, and time. She appears well-developed and well-nourished. No distress.  HENT:  Head: Normocephalic and atraumatic.  Right Ear: External ear normal.  Left Ear: External ear normal.  Nose: Nose normal.  Mouth/Throat: Oropharynx is clear and moist. No oropharyngeal exudate.  Eyes: Conjunctivae and EOM are normal. Pupils are equal, round, and reactive to light. Right eye exhibits no discharge. Left eye exhibits no discharge. No scleral icterus.  Neck: Normal range of motion.  Neck supple. No JVD present. No tracheal deviation present. No thyromegaly present.  Cardiovascular: Normal rate, regular rhythm and intact distal pulses.  Exam reveals no gallop and no friction rub.   Murmur heard. 2-4/4 systolic murmur  Pulmonary/Chest: Effort normal and breath sounds normal. No stridor. No respiratory distress. She has no wheezes. She has no rales. She exhibits no tenderness.  Abdominal: Soft. Bowel sounds are normal. She exhibits no distension and no mass. There is no tenderness. There is no rebound and no guarding.  Prior exam: 2 small hemorrhoids at 11 am.   Genitourinary: Guaiac negative stool.  Musculoskeletal: Normal range of motion. She exhibits tenderness. She exhibits no edema.  R leg pain in groin and upper leg with weight bearing since fall 11/10/13-much improved.   Lymphadenopathy:    She has no cervical adenopathy.  Neurological: She is alert and oriented to person, place, and time. She has normal reflexes. No cranial nerve deficit. She exhibits abnormal muscle tone. Coordination normal.  Skin: Skin is warm. No rash noted. She is not diaphoretic. No erythema. No pallor.     Psychiatric: Her mood appears anxious. Her affect is angry. Her affect is not blunt, not labile and not inappropriate. Her speech is not delayed, not tangential and not slurred. She is not agitated, not aggressive, not hyperactive, not slowed, not withdrawn, not actively hallucinating and not combative. Thought content is paranoid. Thought content is not delusional. Cognition and memory are impaired. She does not express impulsivity or inappropriate judgment. She exhibits a depressed mood. She expresses no homicidal and no suicidal ideation. She expresses no suicidal plans and no homicidal plans. She is communicative. She exhibits abnormal recent memory and abnormal remote memory.  Stabilized in SNF She is attentive.    Filed Vitals:   05/29/14 1522  BP: 130/58  Pulse: 60  Temp: 98.8 F  (37.1 C)  TempSrc: Tympanic  Resp: 18  Labs reviewed: Basic Metabolic Panel:  Recent Labs  10/09/13 10/26/13 11/10/13 0701 03/26/14  NA 139  --  142 142  K 4.0  --  3.6* 4.5  CL  --   --  102  --   CO2  --   --  25  --   GLUCOSE  --   --  120*  --   BUN 37*  --  43* 32*  CREATININE 1.2*  --  1.22* 1.1  CALCIUM  --   --  9.9  --   TSH  --  2.12  --   --    Liver Function Tests:  Recent Labs  11/10/13 0701 03/26/14  AST 32 24  ALT 22 15  ALKPHOS 64 79  BILITOT 0.6  --   PROT 6.6  --   ALBUMIN 3.4*  --    No results for input(s): LIPASE, AMYLASE in the last 8760 hours. No results for input(s): AMMONIA in the last 8760 hours. CBC:  Recent Labs  10/09/13 11/10/13 0701 03/26/14  WBC 3.9 11.6* 4.9  NEUTROABS  --  10.3*  --   HGB 9.3* 10.9* 10.9*  HCT 28* 32.9* 33*  MCV  --  95.1  --   PLT 179 154 210   Lipid Panel: No results for input(s): CHOL, HDL, LDLCALC, TRIG, CHOLHDL, LDLDIRECT in the last 8760 hours.  Past Procedures:  10/24/12 EKG: sinus rhythm with marked sinus arrhythmia.   05/16/13 X-ray thoracic spine: mold compression fractures no evidence of a recent fracture. However, a subtle increase in an old fracture may be inapparent radio graphically.   11/10/13 X-ray R hip:  IMPRESSION: 1. Possible nondisplaced right inferior pubic ramus fracture. 2. No femur fracture or dislocation  11/10/13 CT head and cervical spine:  IMPRESSION: 1. No evidence of traumatic intracranial injury or fracture. 2. No evidence of fracture or subluxation along the cervical spine. 3. Soft tissue swelling at the right vertex. 4. Moderate cortical volume loss and scattered small vessel ischemic microangiopathy. 5. Mild degenerative change noted along the cervical spine. 6. Dense calcification and scarring at the lung apices. 7. Dense calcification noted at the left carotid bifurcation. Carotid ultrasound would be helpful for further evaluation, when and as deemed  clinically appropriate.   Assessment/Plan Constipation Stable, taking MiraLax q3 days    Hypothyroidism Continue Levothyroxine 93mcg daily. TSH 1.76 05/25/13, 2.124 10/26/13    HTN (hypertension) Continue Novasc 1m and Bystolic 10mg  daily for Bp. Controlled. Off Diovan/HCT. Takes Atorvastatin and ASA for cardiovascular risk reduction.     Depression with anxiety Crys at night, cannot return to sleep after bathroom trips(2-3x/night), stated she hates this place([facility]). She believes her son in law takes advantage of her money. Failed Zoloft, Mirtazapine, and Trazodone. Better Lexapro to stabilize her mood.  12/20/13 SNF [facility] stable.  --mood is stable    Senile dementia 12/31/13 MMSE 11/30 MMSE 18/30 05/2013 per Dtr. May consider Namenda. --stable in SNF-takes Namenda 28mg  daily.      Closed fracture of right inferior pubic ramus 11/10/13 s/p fall, ED eval  X-ray R hip  1. Possible nondisplaced right inferior pubic ramus fracture.  2. No femur fracture or dislocation 11/12/13 admitted to SNF-no longer self care sufficiency in AL setting. C/o R leg pain with weight bearing. Adding Norco 5/325mg  q6h prn for pain. May consider Ortho and MRI if no better   12/20/13 pain is much improved the right hip/groin/thigh area.  01/21/14 no c/o pain-consider healed clinically.  02/06/14 dc prn Norco/Motrin-not used. Tylenol 650mg  q6 prn available to her in addition to Tylenol 500mg  bid.  ---ambulates with walker, taking Tylenol bid.     Anemia of chronic disease 07/03/13 Hgb 8.9 07/03/13. Normal Iron 52, B12 641, Folate 15.5  07/26/13 Hgb 9.5 10/09/13 Hgb 9.3 11/10/13 Hgb 10.9     Family/ Staff Communication: observe the patient  Goals of Care: SNF  Labs/tests ordered: none

## 2014-07-17 ENCOUNTER — Encounter: Payer: Self-pay | Admitting: Nurse Practitioner

## 2014-07-17 ENCOUNTER — Non-Acute Institutional Stay: Payer: PPO | Admitting: Nurse Practitioner

## 2014-07-17 DIAGNOSIS — R350 Frequency of micturition: Secondary | ICD-10-CM

## 2014-07-17 DIAGNOSIS — K449 Diaphragmatic hernia without obstruction or gangrene: Secondary | ICD-10-CM

## 2014-07-17 DIAGNOSIS — I10 Essential (primary) hypertension: Secondary | ICD-10-CM

## 2014-07-17 DIAGNOSIS — D638 Anemia in other chronic diseases classified elsewhere: Secondary | ICD-10-CM

## 2014-07-17 DIAGNOSIS — K5900 Constipation, unspecified: Secondary | ICD-10-CM | POA: Diagnosis not present

## 2014-07-17 DIAGNOSIS — E039 Hypothyroidism, unspecified: Secondary | ICD-10-CM | POA: Diagnosis not present

## 2014-07-17 DIAGNOSIS — F0390 Unspecified dementia without behavioral disturbance: Secondary | ICD-10-CM | POA: Diagnosis not present

## 2014-07-17 DIAGNOSIS — F418 Other specified anxiety disorders: Secondary | ICD-10-CM

## 2014-07-17 NOTE — Assessment & Plan Note (Signed)
Continue Novasc 36m and Bystolic 10mg  daily for Bp. Controlled. Off Diovan/HCT. Takes Atorvastatin and ASA for cardiovascular risk reduction.

## 2014-07-17 NOTE — Assessment & Plan Note (Signed)
Continue Levothyroxine 69mcg daily. TSH 1.76 05/25/13, 2.124 10/26/13

## 2014-07-17 NOTE — Assessment & Plan Note (Signed)
12/31/13 MMSE 11/30 MMSE 18/30 05/2013 per Dtr. May consider Namenda. --stable in SNF-takes Namenda 28mg  daily.

## 2014-07-17 NOTE — Assessment & Plan Note (Signed)
Crys at night, cannot return to sleep after bathroom trips(2-3x/night), stated she hates this place([facility]). She believes her son in law takes advantage of her money. Failed Zoloft, Mirtazapine, and Trazodone. Better Lexapro to stabilize her mood.  12/20/13 SNF [facility] stable.  --mood is stable

## 2014-07-17 NOTE — Assessment & Plan Note (Signed)
Stable, taking MiraLax q3 days

## 2014-07-17 NOTE — Assessment & Plan Note (Signed)
Chronic. Has been evaluated by Dr. Amalia Hailey.

## 2014-07-17 NOTE — Progress Notes (Signed)
Patient ID: Patricia Nelson, female   DOB: 24-May-1915, 81 y.o.   MRN: [id number]   Code Status: DNR  Allergies  Allergen Reactions  . Ace Inhibitors     cough  . Mirtazapine     Nightmares   . Penicillins Rash    Chief Complaint  Patient presents with  . Medical Management of Chronic Issues    HPI: Patient is a 81 y.o. female seen in the SNF at [facility] today for evaluation of chronic medical conditions.  Problem List Items Addressed This Visit    HTN (hypertension) - Primary    Continue Novasc 57m and Bystolic 10mg  daily for Bp. Controlled. Off Diovan/HCT. Takes Atorvastatin and ASA for cardiovascular risk reduction.         Anemia of chronic disease    07/03/13 Hgb 8.9 07/03/13. Normal Iron 52, B12 641, Folate 15.5  07/26/13 Hgb 9.5 10/09/13 Hgb 9.3 11/10/13 Hgb 10.9       Hypothyroidism    Continue Levothyroxine 61mcg daily. TSH 1.76 05/25/13, 2.124 10/26/13        Constipation    Stable, taking MiraLax q3 days        Depression with anxiety    Crys at night, cannot return to sleep after bathroom trips(2-3x/night), stated she hates this place([facility]). She believes her son in law takes advantage of her money. Failed Zoloft, Mirtazapine, and Trazodone. Better Lexapro to stabilize her mood.  12/20/13 SNF [facility] stable.  --mood is stable       Senile dementia    12/31/13 MMSE 11/30 MMSE 18/30 05/2013 per Dtr. May consider Namenda. --stable in SNF-takes Namenda 28mg  daily.       Urinary frequency    Chronic. Has been evaluated by Dr. Amalia Hailey.         Hiatal hernia    Stable.           Review of Systems:  Review of Systems  Constitutional: Negative for fever, chills and diaphoresis.  HENT: Positive for hearing loss. Negative for congestion, ear discharge, ear pain, nosebleeds, sore throat and tinnitus.   Eyes: Negative for photophobia, pain, discharge and redness.  Respiratory: Negative for cough, shortness of breath and stridor.     Cardiovascular: Negative for chest pain, palpitations and leg swelling.  Gastrointestinal: Negative for nausea, vomiting, abdominal pain, diarrhea, constipation and blood in stool.  Endocrine: Negative for polydipsia.  Genitourinary: Positive for frequency. Negative for dysuria, urgency, hematuria and flank pain.  Musculoskeletal: Negative for myalgias, back pain and neck pain.       Ambulates with walker.   Skin: Negative for rash.  Allergic/Immunologic: Negative for environmental allergies.  Neurological: Negative for dizziness, tremors, seizures, weakness and headaches.  Hematological: Does not bruise/bleed easily.  Psychiatric/Behavioral: Negative for suicidal ideas and hallucinations. The patient is nervous/anxious.      Past Medical History  Diagnosis Date  . Arthritis   . Depression   . Cataract   . Thyroid disease   . Anxiety   . Osteoporosis   . Hypertension   . Atherosclerotic cerebrovascular disease   . Hyperlipidemia   . Pancreatitis   . Other sleep disturbances   . Backache, unspecified   . Hemorrhage of rectum and anus 03/26/2013  . Diseases of lips 03/26/2013  . Senile osteoporosis     with old T11 fracture  . Chronic kidney disease, unspecified   . Other malaise and fatigue   . Anorexia   . Insomnia, unspecified   . Other  symptoms involving cardiovascular system   . Other specified cardiac dysrhythmias(427.89)   . Diaphragmatic hernia without mention of obstruction or gangrene   . Macular degeneration (senile) of retina, unspecified   . Personal history of other diseases of digestive system     pancreatitis from gallstones  . Cholelithiasis   . Unspecified disorders of arteries and arterioles   . Other specified personal history presenting hazards to health(V15.89)   . Personal history of other diseases of circulatory system   . Unspecified hemorrhoids without mention of complication   . Unspecified hearing loss   . Anemia, unspecified   . Rosacea   .  Personal history of traumatic fracture   . Personal history of other diseases of respiratory system   . Diverticulosis of colon (without mention of hemorrhage)    Past Surgical History  Procedure Laterality Date  . Abdominal hysterectomy      and BSO fibroids  . Joint replacement  1996  . Colonoscopy  05/23/2007  . Total hip arthroplasty Right    Social History:   reports that she has never smoked. She does not have any smokeless tobacco history on file. She reports that she does not drink alcohol or use illicit drugs.  Family History  Problem Relation Age of Onset  . Heart disease Mother   . Diabetes Mother   . Heart disease Father   . Diabetes Sister   . Diabetes Brother   . Diabetes Daughter     Medications: Patient's Medications  New Prescriptions   No medications on file  Previous Medications   ACETAMINOPHEN (TYLENOL) 325 MG TABLET    Take 650 mg by mouth every 6 (six) hours as needed.   AMLODIPINE (NORVASC) 10 MG TABLET    Take 10 mg by mouth daily.   ASCORBIC ACID (VITAMIN C) 1000 MG TABLET    Take 1,000 mg by mouth daily. Take one daily   ASPIRIN 81 MG TABLET    Take 81 mg by mouth daily.   ATORVASTATIN (LIPITOR) 10 MG TABLET    Take one tablet daily   BYSTOLIC 10 MG TABLET    Take one tablet daily for blood pressusre   CALCIUM CARBONATE-VITAMIN D (CALCIUM 600+D) 600-400 MG-UNIT PER TABLET    Take 1 tablet by mouth 2 (two) times daily.    CHOLECALCIFEROL (VITAMIN D) 1000 UNITS TABLET    Take 1,000 Units by mouth daily.   CLINDAMYCIN (CLEOCIN) 150 MG CAPSULE    Take 150 mg by mouth. 4 caps one hour prior dental   COENZYME Q10 (CO Q-10) 200 MG CAPS    Take 1 capsule by mouth daily. Take one daily   ENSURE PLUS (ENSURE PLUS) LIQD    Take 237 mLs by mouth 2 (two) times daily between meals.   ESCITALOPRAM (LEXAPRO) 10 MG TABLET    Take one tablet daily   GUAIFENESIN (HUMIBID E) 400 MG TABS TABLET    Take 400 mg by mouth. Every 4 hours as needed cough   LEVOTHYROXINE  (SYNTHROID, LEVOTHROID) 25 MCG TABLET    Take 25 mcg by mouth daily.   MIRTAZAPINE (REMERON) 30 MG TABLET    Take 30 mg by mouth at bedtime. One at bedtime   MULTIPLE VITAMINS-MINERALS (ICAPS PO)    Take by mouth. Take one daily for eyes   OMEGA-3 FATTY ACIDS (FISH OIL) 1000 MG CAPS    Take by mouth. Take one tablet daily   POLYETHYL GLYCOL-PROPYL GLYCOL (SYSTANE) 0.4-0.3 % SOLN  Apply to eye. One drop in both eyes as needed for dry eyes   POLYETHYLENE GLYCOL (MIRALAX / GLYCOLAX) PACKET    Take 17 g by mouth every 3 (three) days.    RESTASIS 0.05 % OPHTHALMIC EMULSION    One drop both eyes twice daily   TRAZODONE (DESYREL) 50 MG TABLET    Take 25 mg by mouth at bedtime as needed for sleep. Take 1/2 tablet at bedtime  Modified Medications   No medications on file  Discontinued Medications   No medications on file     Physical Exam: Physical Exam  Constitutional: She is oriented to person, place, and time. She appears well-developed and well-nourished. No distress.  HENT:  Head: Normocephalic and atraumatic.  Right Ear: External ear normal.  Left Ear: External ear normal.  Nose: Nose normal.  Mouth/Throat: Oropharynx is clear and moist. No oropharyngeal exudate.  Eyes: Conjunctivae and EOM are normal. Pupils are equal, round, and reactive to light. Right eye exhibits no discharge. Left eye exhibits no discharge. No scleral icterus.  Neck: Normal range of motion. Neck supple. No JVD present. No tracheal deviation present. No thyromegaly present.  Cardiovascular: Normal rate, regular rhythm and intact distal pulses.  Exam reveals no gallop and no friction rub.   Murmur heard. 8-4/1 systolic murmur  Pulmonary/Chest: Effort normal and breath sounds normal. No stridor. No respiratory distress. She has no wheezes. She has no rales. She exhibits no tenderness.  Abdominal: Soft. Bowel sounds are normal. She exhibits no distension and no mass. There is no tenderness. There is no rebound and no  guarding.  Prior exam: 2 small hemorrhoids at 11 am.   Genitourinary: Guaiac negative stool.  Musculoskeletal: Normal range of motion. She exhibits tenderness. She exhibits no edema.  R leg pain in groin and upper leg with weight bearing since fall 11/10/13-much improved.   Lymphadenopathy:    She has no cervical adenopathy.  Neurological: She is alert and oriented to person, place, and time. She has normal reflexes. No cranial nerve deficit. She exhibits abnormal muscle tone. Coordination normal.  Skin: Skin is warm. No rash noted. She is not diaphoretic. No erythema. No pallor.     Psychiatric: Her mood appears anxious. Her affect is angry. Her affect is not blunt, not labile and not inappropriate. Her speech is not delayed, not tangential and not slurred. She is not agitated, not aggressive, not hyperactive, not slowed, not withdrawn, not actively hallucinating and not combative. Thought content is paranoid. Thought content is not delusional. Cognition and memory are impaired. She does not express impulsivity or inappropriate judgment. She exhibits a depressed mood. She expresses no homicidal and no suicidal ideation. She expresses no suicidal plans and no homicidal plans. She is communicative. She exhibits abnormal recent memory and abnormal remote memory.  Stabilized in SNF She is attentive.    Filed Vitals:   07/17/14 1541  BP: 152/58  Pulse: 60  Temp: 97.5 F (36.4 C)  TempSrc: Tympanic  Resp: 18      Labs reviewed: Basic Metabolic Panel:  Recent Labs  10/09/13 10/26/13 11/10/13 0701 03/26/14  NA 139  --  142 142  K 4.0  --  3.6* 4.5  CL  --   --  102  --   CO2  --   --  25  --   GLUCOSE  --   --  120*  --   BUN 37*  --  43* 32*  CREATININE 1.2*  --  1.22*  1.1  CALCIUM  --   --  9.9  --   TSH  --  2.12  --   --    Liver Function Tests:  Recent Labs  11/10/13 0701 03/26/14  AST 32 24  ALT 22 15  ALKPHOS 64 79  BILITOT 0.6  --   PROT 6.6  --   ALBUMIN 3.4*  --     No results for input(s): LIPASE, AMYLASE in the last 8760 hours. No results for input(s): AMMONIA in the last 8760 hours. CBC:  Recent Labs  10/09/13 11/10/13 0701 03/26/14  WBC 3.9 11.6* 4.9  NEUTROABS  --  10.3*  --   HGB 9.3* 10.9* 10.9*  HCT 28* 32.9* 33*  MCV  --  95.1  --   PLT 179 154 210   Lipid Panel: No results for input(s): CHOL, HDL, LDLCALC, TRIG, CHOLHDL, LDLDIRECT in the last 8760 hours.  Past Procedures:  10/24/12 EKG: sinus rhythm with marked sinus arrhythmia.   05/16/13 X-ray thoracic spine: mold compression fractures no evidence of a recent fracture. However, a subtle increase in an old fracture may be inapparent radio graphically.   11/10/13 X-ray R hip:  IMPRESSION: 1. Possible nondisplaced right inferior pubic ramus fracture. 2. No femur fracture or dislocation  11/10/13 CT head and cervical spine:  IMPRESSION: 1. No evidence of traumatic intracranial injury or fracture. 2. No evidence of fracture or subluxation along the cervical spine. 3. Soft tissue swelling at the right vertex. 4. Moderate cortical volume loss and scattered small vessel ischemic microangiopathy. 5. Mild degenerative change noted along the cervical spine. 6. Dense calcification and scarring at the lung apices. 7. Dense calcification noted at the left carotid bifurcation. Carotid ultrasound would be helpful for further evaluation, when and as deemed clinically appropriate.   Assessment/Plan HTN (hypertension) Continue Novasc 93m and Bystolic 10mg  daily for Bp. Controlled. Off Diovan/HCT. Takes Atorvastatin and ASA for cardiovascular risk reduction.      Anemia of chronic disease 07/03/13 Hgb 8.9 07/03/13. Normal Iron 52, B12 641, Folate 15.5  07/26/13 Hgb 9.5 10/09/13 Hgb 9.3 11/10/13 Hgb 10.9    Hypothyroidism Continue Levothyroxine 29mcg daily. TSH 1.76 05/25/13, 2.124 10/26/13     Constipation Stable, taking MiraLax q3 days     Depression with anxiety Crys at  night, cannot return to sleep after bathroom trips(2-3x/night), stated she hates this place([facility]). She believes her son in law takes advantage of her money. Failed Zoloft, Mirtazapine, and Trazodone. Better Lexapro to stabilize her mood.  12/20/13 SNF [facility] stable.  --mood is stable    Senile dementia 12/31/13 MMSE 11/30 MMSE 18/30 05/2013 per Dtr. May consider Namenda. --stable in SNF-takes Namenda 28mg  daily.    Urinary frequency Chronic. Has been evaluated by Dr. Amalia Hailey.      Hiatal hernia Stable.       Family/ Staff Communication: observe the patient  Goals of Care: SNF  Labs/tests ordered: none

## 2014-07-17 NOTE — Assessment & Plan Note (Signed)
Stable

## 2014-07-17 NOTE — Assessment & Plan Note (Signed)
07/03/13 Hgb 8.9 07/03/13. Normal Iron 52, B12 641, Folate 15.5  07/26/13 Hgb 9.5 10/09/13 Hgb 9.3 11/10/13 Hgb 10.9

## 2014-08-19 ENCOUNTER — Encounter: Payer: Self-pay | Admitting: Internal Medicine

## 2014-08-19 ENCOUNTER — Non-Acute Institutional Stay (SKILLED_NURSING_FACILITY): Payer: PPO | Admitting: Internal Medicine

## 2014-08-19 DIAGNOSIS — K5900 Constipation, unspecified: Secondary | ICD-10-CM | POA: Diagnosis not present

## 2014-08-19 DIAGNOSIS — N182 Chronic kidney disease, stage 2 (mild): Secondary | ICD-10-CM

## 2014-08-19 DIAGNOSIS — F0390 Unspecified dementia without behavioral disturbance: Secondary | ICD-10-CM

## 2014-08-19 DIAGNOSIS — D638 Anemia in other chronic diseases classified elsewhere: Secondary | ICD-10-CM | POA: Diagnosis not present

## 2014-08-19 DIAGNOSIS — I10 Essential (primary) hypertension: Secondary | ICD-10-CM

## 2014-08-19 DIAGNOSIS — E039 Hypothyroidism, unspecified: Secondary | ICD-10-CM

## 2014-08-19 NOTE — Progress Notes (Signed)
Patient ID: Patricia Nelson, female   DOB: [birth year], 81 y.o.   MRN: [id number]    [facility] ([facility]     Allergies  Allergen Reactions  . Ace Inhibitors     cough  . Mirtazapine     Nightmares   . Penicillins Rash    Chief Complaint  Patient presents with  . Medical Management of Chronic Issues    HPI:  Essential hypertension: Controlled  Anemia of chronic disease: Has been stable with chronically low Hgb, most recently - 11/10/13 Hgb 10.9 and 03/26/14 Hgb 10.9   Constipation, unspecified constipation type: Stable  Senile dementia, without behavioral disturbance: Pleasantly confused, caring for baby doll Madelyn on exam. Follows commands  Hypothyroidism, unspecified hypothyroidism type: Stable  Chronic renal disease, stage 2 (mild): Stable    Medications: Patient's Medications  New Prescriptions   No medications on file  Previous Medications   ACETAMINOPHEN (TYLENOL) 325 MG TABLET    Take 650 mg by mouth every 6 (six) hours as needed.   AMLODIPINE (NORVASC) 10 MG TABLET    Take 10 mg by mouth daily.   ASPIRIN 81 MG TABLET    Take 81 mg by mouth daily.   BYSTOLIC 10 MG TABLET    Take one tablet daily for blood pressusre   CLINDAMYCIN (CLEOCIN) 150 MG CAPSULE    Take 150 mg by mouth. 4 caps one hour prior dental   ESCITALOPRAM (LEXAPRO) 10 MG TABLET    Take one tablet daily   LEVOTHYROXINE (SYNTHROID, LEVOTHROID) 25 MCG TABLET    Take 25 mcg by mouth daily.   MEMANTINE (NAMENDA XR) 28 MG CP24 24 HR CAPSULE    Take 28 mg by mouth.   POLYETHYLENE GLYCOL (MIRALAX / GLYCOLAX) PACKET    Take 17 g by mouth every 3 (three) days.    RESTASIS 0.05 % OPHTHALMIC EMULSION    One drop both eyes twice daily  Modified Medications   No medications on file  Discontinued Medications   ASCORBIC ACID (VITAMIN C) 1000 MG TABLET    Take 1,000 mg by mouth daily. Take one daily   ATORVASTATIN (LIPITOR) 10 MG TABLET    Take one tablet daily   CALCIUM CARBONATE-VITAMIN D (CALCIUM 600+D) 600-400 MG-UNIT PER TABLET    Take 1 tablet by mouth 2 (two) times daily.    CHOLECALCIFEROL (VITAMIN D) 1000 UNITS TABLET    Take 1,000 Units by mouth daily.   COENZYME Q10 (CO Q-10) 200 MG CAPS    Take 1 capsule by mouth daily. Take one daily   ENSURE PLUS (ENSURE PLUS) LIQD    Take 237 mLs by mouth 2 (two) times daily between meals.   GUAIFENESIN (HUMIBID E) 400 MG TABS TABLET    Take 400 mg by mouth. Every 4 hours as needed cough   MIRTAZAPINE (REMERON) 30 MG TABLET    Take 30 mg by mouth at bedtime. One at bedtime   MULTIPLE VITAMINS-MINERALS (ICAPS PO)    Take by mouth. Take one daily for eyes   OMEGA-3 FATTY ACIDS (FISH OIL) 1000 MG CAPS    Take by mouth. Take one tablet daily   POLYETHYL GLYCOL-PROPYL GLYCOL (SYSTANE) 0.4-0.3 % SOLN    Apply to eye. One drop in both eyes as needed for dry eyes   TRAZODONE (DESYREL) 50 MG TABLET    Take 25 mg by mouth at bedtime as needed for sleep. Take 1/2 tablet at bedtime  Review of Systems  Constitutional: Negative for fever, chills and diaphoresis.  HENT: Positive for hearing loss. Negative for congestion, ear discharge, ear pain, nosebleeds, sore throat and tinnitus.   Eyes: Negative for photophobia, pain, discharge and redness.  Respiratory: Negative for cough, shortness of breath and stridor.   Cardiovascular: Negative for chest pain, palpitations and leg swelling.  Gastrointestinal: Negative for nausea, vomiting, abdominal pain, diarrhea, constipation and blood in stool.  Endocrine: Negative for polydipsia.  Genitourinary: Positive for frequency. Negative for dysuria, urgency, hematuria and flank pain.  Musculoskeletal: Negative for myalgias, back pain and neck pain.       Ambulates with walker.   Skin: Negative for rash.  Allergic/Immunologic: Negative for environmental allergies.  Neurological: Negative for dizziness, tremors, seizures, weakness and headaches.  Hematological: Does not  bruise/bleed easily.  Psychiatric/Behavioral: Positive for confusion. Negative for suicidal ideas, hallucinations and agitation. The patient is nervous/anxious.     Filed Vitals:   08/19/14 1452  BP: 133/67  Pulse: 60  Temp: 98.3 F (36.8 C)  Resp: 18  Weight: 123 lb 6.4 oz (55.974 kg)   Body mass index is 22.03 kg/(m^2).  Physical Exam  Constitutional: She is oriented to person, place, and time. She appears well-developed and well-nourished. No distress.  HENT:  Head: Normocephalic and atraumatic.  Right Ear: External ear normal.  Left Ear: External ear normal.  Nose: Nose normal.  Mouth/Throat: Oropharynx is clear and moist. No oropharyngeal exudate.  Eyes: Conjunctivae and EOM are normal. Pupils are equal, round, and reactive to light. Right eye exhibits no discharge. Left eye exhibits no discharge. No scleral icterus.  Neck: Normal range of motion. Neck supple. No JVD present. No tracheal deviation present. No thyromegaly present.  Cardiovascular: Normal rate, regular rhythm and intact distal pulses.  Exam reveals no gallop and no friction rub.   Murmur heard. 2/6 systolic murmur  Pulmonary/Chest: Effort normal and breath sounds normal. No stridor. No respiratory distress. She has no wheezes. She has no rales. She exhibits no tenderness.  Abdominal: Soft. Bowel sounds are normal. She exhibits no distension and no mass. There is no tenderness. There is no rebound and no guarding.  Genitourinary: Guaiac negative stool.  Musculoskeletal: Normal range of motion. She exhibits tenderness. She exhibits no edema.  Lymphadenopathy:    She has no cervical adenopathy.  Neurological: She is alert and oriented to person, place, and time. She has normal reflexes. No cranial nerve deficit. She exhibits abnormal muscle tone. Coordination normal.  Skin: Skin is warm. No rash noted. She is not diaphoretic. No erythema. No pallor.  Lemon sized Lentigo to left lower cheek  Psychiatric: Her mood  appears anxious. Her affect is angry. Her affect is not blunt, not labile and not inappropriate. Her speech is not delayed, not tangential and not slurred. She is not agitated, not aggressive, not hyperactive, not slowed, not withdrawn, not actively hallucinating and not combative. Thought content is paranoid. Thought content is not delusional. Cognition and memory are impaired. She does not express impulsivity or inappropriate judgment. She exhibits a depressed mood. She expresses no homicidal and no suicidal ideation. She expresses no suicidal plans and no homicidal plans. She is communicative. She exhibits abnormal recent memory and abnormal remote memory.  Stabilized in [facility] She is attentive.     Labs reviewed: No visits with results within 3 Month(s) from this visit. Latest known visit with results is:  Lab on 03/27/2014  Component Date Value Ref Range Status  . Hemoglobin 03/26/2014  10.9* 12.0 - 16.0 g/dL Final  . HCT 03/26/2014 33* 36 - 46 % Final  . Platelets 03/26/2014 210  150 - 399 K/L Final  . WBC 03/26/2014 4.9   Final  . Glucose 03/26/2014 79   Final  . BUN 03/26/2014 32* 4 - 21 mg/dL Final  . Creatinine 03/26/2014 1.1  0.5 - 1.1 mg/dL Final  . Potassium 03/26/2014 4.5  3.4 - 5.3 mmol/L Final  . Sodium 03/26/2014 142  137 - 147 mmol/L Final  . Alkaline Phosphatase 03/26/2014 79  25 - 125 U/L Final  . ALT 03/26/2014 15  7 - 35 U/L Final  . AST 03/26/2014 24  13 - 35 U/L Final  . Bilirubin, Total 03/26/2014 0.4   Final     Assessment/Plan 1. Essential hypertension Controlled, continue Amlodipine and Bystolic  2. Anemia of chronic disease Check CBC in 2 months    3. Constipation, unspecified constipation type Stable with Miralax q3 days  4. Senile dementia, without behavioral disturbance Continue Namenda XR  5. Hypothyroidism, unspecified hypothyroidism type TSH Continue Synthroid 25 mcg  6. Chronic renal disease, stage 2 (mild) Stable

## 2014-08-20 LAB — TSH: TSH: 1.98 u[IU]/mL (ref 0.41–5.90)

## 2014-08-21 ENCOUNTER — Other Ambulatory Visit: Payer: Self-pay | Admitting: Nurse Practitioner

## 2014-08-21 DIAGNOSIS — E039 Hypothyroidism, unspecified: Secondary | ICD-10-CM

## 2014-11-06 ENCOUNTER — Encounter: Payer: Self-pay | Admitting: Nurse Practitioner

## 2014-11-06 ENCOUNTER — Non-Acute Institutional Stay (SKILLED_NURSING_FACILITY): Payer: PPO | Admitting: Nurse Practitioner

## 2014-11-06 DIAGNOSIS — L299 Pruritus, unspecified: Secondary | ICD-10-CM | POA: Diagnosis not present

## 2014-11-06 DIAGNOSIS — R350 Frequency of micturition: Secondary | ICD-10-CM

## 2014-11-06 DIAGNOSIS — E039 Hypothyroidism, unspecified: Secondary | ICD-10-CM | POA: Diagnosis not present

## 2014-11-06 DIAGNOSIS — F0390 Unspecified dementia without behavioral disturbance: Secondary | ICD-10-CM | POA: Diagnosis not present

## 2014-11-06 DIAGNOSIS — D638 Anemia in other chronic diseases classified elsewhere: Secondary | ICD-10-CM | POA: Diagnosis not present

## 2014-11-06 DIAGNOSIS — I10 Essential (primary) hypertension: Secondary | ICD-10-CM | POA: Diagnosis not present

## 2014-11-06 DIAGNOSIS — K5900 Constipation, unspecified: Secondary | ICD-10-CM

## 2014-11-06 DIAGNOSIS — K449 Diaphragmatic hernia without obstruction or gangrene: Secondary | ICD-10-CM

## 2014-11-06 DIAGNOSIS — F418 Other specified anxiety disorders: Secondary | ICD-10-CM | POA: Diagnosis not present

## 2014-11-06 NOTE — Assessment & Plan Note (Signed)
-  stable in SNF, walking with walker a lot through out day, continue Namenda 28mg  daily.

## 2014-11-06 NOTE — Assessment & Plan Note (Signed)
Stable, Hgb 10s last checked.

## 2014-11-06 NOTE — Assessment & Plan Note (Signed)
Controlled. Continue Novasc 31m, Bystolic 10mg  daily for Bp. Off Diovan/HCT. Takes Atorvastatin and ASA for cardiovascular risk reduction.

## 2014-11-06 NOTE — Assessment & Plan Note (Signed)
Continue Levothyroxine 8mcg daily. TSH 1.98 08/20/14

## 2014-11-06 NOTE — Assessment & Plan Note (Signed)
Mood is stable, continue Lexapro.

## 2014-11-06 NOTE — Assessment & Plan Note (Signed)
Chronic. Has been evaluated by Dr. Amalia Hailey. Adult depends for urinary leakage.

## 2014-11-06 NOTE — Assessment & Plan Note (Signed)
Stable, continue MiraLax q 3 days.  

## 2014-11-06 NOTE — Assessment & Plan Note (Signed)
stable °

## 2014-11-06 NOTE — Assessment & Plan Note (Signed)
Scratched injuries due to itching skin, staff reported no rash or skin change when the patient complained itching, continue Hydrocortisone cream for itching.

## 2014-11-06 NOTE — Progress Notes (Signed)
Patient ID: Patricia Nelson, female   DOB: 11/18/1915, 81 y.o.   MRN: [id number]  Location:  SNF [facility] Provider:  Marlana Latus NP  Code Status:  DNR Goals of care: Advanced Directive information    Chief Complaint  Patient presents with  . Medical Management of Chronic Issues  . Acute Visit    iching skin in neck and upper back     HPI: Patient is a 81 y.o. female seen in the SNF at [facility] today for evaluation of  Itching, scratched injuries in neck and upper back area, hydrocortisone cream helped, she said its no longer itching. No apparent rash, no noted change in clothing or laundry detergent.   Review of Systems:  Review of Systems  Constitutional: Negative for fever and chills.  HENT: Positive for hearing loss. Negative for congestion, ear discharge, ear pain, nosebleeds and sore throat.   Eyes: Negative for pain, discharge and redness.  Respiratory: Negative for cough, shortness of breath and stridor.   Cardiovascular: Negative for chest pain, palpitations and leg swelling.  Gastrointestinal: Negative for nausea, vomiting, abdominal pain, diarrhea and constipation.  Genitourinary: Positive for frequency. Negative for dysuria, urgency and flank pain.  Musculoskeletal: Negative for myalgias, back pain and neck pain.       Ambulates with walker.   Skin: Positive for itching. Negative for rash.       Neck and upper back itching and scratched injuries  Neurological: Negative for dizziness, tremors, seizures, weakness and headaches.  Psychiatric/Behavioral: Positive for memory loss. Negative for suicidal ideas and hallucinations. The patient is nervous/anxious.     Past Medical History  Diagnosis Date  . Arthritis   . Depression   . Cataract   . Thyroid disease   . Anxiety   . Osteoporosis   . Hypertension   . Atherosclerotic cerebrovascular disease   . Hyperlipidemia   . Pancreatitis   . Other sleep disturbances   . Backache, unspecified   . Hemorrhage  of rectum and anus 03/26/2013  . Diseases of lips 03/26/2013  . Senile osteoporosis     with old T11 fracture  . Chronic kidney disease, unspecified   . Other malaise and fatigue   . Anorexia   . Insomnia, unspecified   . Other symptoms involving cardiovascular system   . Other specified cardiac dysrhythmias(427.89)   . Diaphragmatic hernia without mention of obstruction or gangrene   . Macular degeneration (senile) of retina, unspecified   . Personal history of other diseases of digestive system     pancreatitis from gallstones  . Cholelithiasis   . Unspecified disorders of arteries and arterioles   . Other specified personal history presenting hazards to health(V15.89)   . Personal history of other diseases of circulatory system   . Unspecified hemorrhoids without mention of complication   . Unspecified hearing loss   . Anemia, unspecified   . Rosacea   . Personal history of traumatic fracture   . Personal history of other diseases of respiratory system   . Diverticulosis of colon (without mention of hemorrhage)   . Laceration of occipital scalp 11/12/2013  . Macular degeneration 06/28/2013  . Closed fracture of right inferior pubic ramus 11/12/2013    11/10/13 s/p fall, ED eval  X-ray R hip  1. Possible nondisplaced right inferior pubic ramus fracture.  2. No femur fracture or dislocation   02/06/14 dc prn Motrin and Norco-not used      Patient Active Problem List   Diagnosis Date  Noted  . Pruritus 11/06/2014  . Anemia of chronic disease 06/28/2013  . Chronic renal disease 06/28/2013  . Hypothyroidism 06/28/2013  . Constipation 06/28/2013  . Depression with anxiety 06/28/2013  . Senile dementia 06/28/2013  . Osteoporosis, unspecified 06/28/2013  . Insomnia 06/28/2013  . Urinary frequency 06/28/2013  . Hiatal hernia 06/28/2013  . SVT (supraventricular tachycardia) 06/28/2013  . Hx of syncope 06/28/2013  . Hemorrhoids 06/28/2013  . HOH (hard of hearing) 06/28/2013  .  Diverticulosis 06/28/2013  . Hx of pancreatitis 06/28/2013  . HTN (hypertension) 10/18/2012  . Hypercholesteremia 10/18/2012  . Carotid atherosclerosis 10/18/2012  . H/O acute pancreatitis 10/18/2012    Allergies  Allergen Reactions  . Ace Inhibitors     cough  . Mirtazapine     Nightmares   . Penicillins Rash    Medications: Patient's Medications  New Prescriptions   No medications on file  Previous Medications   ACETAMINOPHEN (TYLENOL) 325 MG TABLET    Take 650 mg by mouth every 6 (six) hours as needed.   AMLODIPINE (NORVASC) 10 MG TABLET    Take 10 mg by mouth daily.   ASPIRIN 81 MG TABLET    Take 81 mg by mouth daily.   BYSTOLIC 10 MG TABLET    Take one tablet daily for blood pressusre   CLINDAMYCIN (CLEOCIN) 150 MG CAPSULE    Take 150 mg by mouth. 4 caps one hour prior dental   ESCITALOPRAM (LEXAPRO) 10 MG TABLET    Take one tablet daily   LEVOTHYROXINE (SYNTHROID, LEVOTHROID) 25 MCG TABLET    Take 25 mcg by mouth daily.   MEMANTINE (NAMENDA XR) 28 MG CP24 24 HR CAPSULE    Take 28 mg by mouth.   POLYETHYLENE GLYCOL (MIRALAX / GLYCOLAX) PACKET    Take 17 g by mouth every 3 (three) days.    RESTASIS 0.05 % OPHTHALMIC EMULSION    One drop both eyes twice daily  Modified Medications   No medications on file  Discontinued Medications   No medications on file    Physical Exam: Filed Vitals:   11/06/14 1516  BP: 130/58  Pulse: 60  Temp: 98.2 F (36.8 C)  TempSrc: Tympanic  Resp: 16   There is no weight on file to calculate BMI.  Physical Exam  Constitutional: She is oriented to person, place, and time. She appears well-developed and well-nourished. No distress.  HENT:  Head: Normocephalic and atraumatic.  Right Ear: External ear normal.  Left Ear: External ear normal.  Nose: Nose normal.  Mouth/Throat: Oropharynx is clear and moist. No oropharyngeal exudate.  Eyes: Conjunctivae and EOM are normal. Pupils are equal, round, and reactive to light. Right eye  exhibits no discharge. Left eye exhibits no discharge. No scleral icterus.  Neck: Normal range of motion. Neck supple. No JVD present. No tracheal deviation present. No thyromegaly present.  Cardiovascular: Normal rate, regular rhythm and intact distal pulses.  Exam reveals no friction rub.   Murmur heard. 2/6 systolic murmur  Pulmonary/Chest: Effort normal and breath sounds normal. No stridor. No respiratory distress. She has no wheezes. She has no rales.  Abdominal: Soft. Bowel sounds are normal. She exhibits no distension and no mass. There is no tenderness. There is no rebound and no guarding.  Genitourinary: Guaiac negative stool.  Musculoskeletal: Normal range of motion. She exhibits no edema or tenderness.  Lymphadenopathy:    She has no cervical adenopathy.  Neurological: She is alert and oriented to person, place, and time. She  has normal reflexes. No cranial nerve deficit. She exhibits abnormal muscle tone. Coordination normal.  Skin: Skin is warm. No rash noted. She is not diaphoretic. No erythema. No pallor.  Lemon sized Lentigo to left lower cheek Scratched injuries neck and upper back  Psychiatric: Her mood appears anxious. Her affect is angry. Her affect is not blunt, not labile and not inappropriate. Her speech is not delayed, not tangential and not slurred. She is not agitated, not aggressive, not hyperactive, not slowed, not withdrawn, not actively hallucinating and not combative. Thought content is paranoid. Thought content is not delusional. Cognition and memory are impaired. She does not express impulsivity or inappropriate judgment. She exhibits a depressed mood. She expresses no homicidal and no suicidal ideation. She expresses no suicidal plans and no homicidal plans. She is communicative. She exhibits abnormal recent memory and abnormal remote memory.  Stabilized in MCU She is attentive.    Labs reviewed: Basic Metabolic Panel:  Recent Labs  11/10/13 0701 03/26/14    NA 142 142  K 3.6* 4.5  CL 102  --   CO2 25  --   GLUCOSE 120*  --   BUN 43* 32*  CREATININE 1.22* 1.1  CALCIUM 9.9  --     Liver Function Tests:  Recent Labs  11/10/13 0701 03/26/14  AST 32 24  ALT 22 15  ALKPHOS 64 79  BILITOT 0.6  --   PROT 6.6  --   ALBUMIN 3.4*  --     CBC:  Recent Labs  11/10/13 0701 03/26/14  WBC 11.6* 4.9  NEUTROABS 10.3*  --   HGB 10.9* 10.9*  HCT 32.9* 33*  MCV 95.1  --   PLT 154 210    Lab Results  Component Value Date   TSH 1.98 08/20/2014   No results found for: HGBA1C No results found for: CHOL, HDL, LDLCALC, LDLDIRECT, TRIG, CHOLHDL  Significant Diagnostic Results since last visit: none  Patient Care Team: Estill Dooms, MD as PCP - General (Internal Medicine) Dafney Farler X, NP as Nurse Practitioner (Nurse Practitioner) Wilford Corner, MD as Consulting Physician (Gastroenterology) Sanda Klein, MD as Consulting Physician (Cardiology)  Assessment/Plan Problem List Items Addressed This Visit    HTN (hypertension)    Controlled. Continue Novasc 5m, Bystolic 10mg  daily for Bp. Off Diovan/HCT. Takes Atorvastatin and ASA for cardiovascular risk reduction.       Anemia of chronic disease    Stable, Hgb 10s last checked.       Hypothyroidism    Continue Levothyroxine 76mcg daily. TSH 1.98 08/20/14      Constipation    Stable, continue MiraLax q3 days       Depression with anxiety    Mood is stable, continue Lexapro.       Senile dementia    -stable in SNF, walking with walker a lot through out day, continue Namenda 28mg  daily.       Urinary frequency    Chronic. Has been evaluated by Dr. Amalia Hailey. Adult depends for urinary leakage.       Hiatal hernia    stable      Pruritus - Primary    Scratched injuries due to itching skin, staff reported no rash or skin change when the patient complained itching, continue Hydrocortisone cream for itching.           Family/ staff Communication: continue SNF MCU  for care needs.   Labs/tests ordered: none  [facility] Vandora Jaskulski NP Geriatrics [facility]  [facility] [address], [place] [address] On Call:  [phone] & follow prompts after 5pm & weekends Office Phone:  [phone] Office Fax:  [phone]

## 2014-12-23 ENCOUNTER — Non-Acute Institutional Stay (SKILLED_NURSING_FACILITY): Payer: PPO | Admitting: Nurse Practitioner

## 2014-12-23 ENCOUNTER — Encounter: Payer: Self-pay | Admitting: Nurse Practitioner

## 2014-12-23 DIAGNOSIS — D638 Anemia in other chronic diseases classified elsewhere: Secondary | ICD-10-CM

## 2014-12-23 DIAGNOSIS — F418 Other specified anxiety disorders: Secondary | ICD-10-CM

## 2014-12-23 DIAGNOSIS — F03918 Unspecified dementia, unspecified severity, with other behavioral disturbance: Secondary | ICD-10-CM

## 2014-12-23 DIAGNOSIS — K449 Diaphragmatic hernia without obstruction or gangrene: Secondary | ICD-10-CM | POA: Diagnosis not present

## 2014-12-23 DIAGNOSIS — E039 Hypothyroidism, unspecified: Secondary | ICD-10-CM

## 2014-12-23 DIAGNOSIS — K5900 Constipation, unspecified: Secondary | ICD-10-CM | POA: Diagnosis not present

## 2014-12-23 DIAGNOSIS — F0391 Unspecified dementia with behavioral disturbance: Secondary | ICD-10-CM

## 2014-12-23 DIAGNOSIS — I10 Essential (primary) hypertension: Secondary | ICD-10-CM

## 2014-12-23 NOTE — Progress Notes (Signed)
Patient ID: Patricia Nelson, female   DOB: [birth year], 81 y.o.   MRN: [id number]  Location:  SNF [facility] Provider:  Marlana Latus NP  Code Status:  DNR Goals of care: Advanced Directive information    Chief Complaint  Patient presents with  . Medical Management of Chronic Issues     HPI: Patient is a 81 y.o. female seen in the SNF at [facility] today for evaluation of c/o hair fell out, Hx of hypothyroidism, HTN, dementia, depression.   Review of Systems  Constitutional: Negative for fever and chills.  HENT: Positive for hearing loss. Negative for congestion, ear discharge, ear pain, nosebleeds and sore throat.   Eyes: Negative for pain, discharge and redness.  Respiratory: Negative for cough, shortness of breath and stridor.   Cardiovascular: Negative for chest pain, palpitations and leg swelling.  Gastrointestinal: Negative for nausea, vomiting, abdominal pain, diarrhea and constipation.  Genitourinary: Positive for frequency. Negative for dysuria, urgency and flank pain.  Musculoskeletal: Negative for myalgias, back pain and neck pain.       Ambulates with walker.   Skin: Positive for itching. Negative for rash.       Neck and upper back itching and scratched injuries  Neurological: Negative for dizziness, tremors, seizures, weakness and headaches.  Psychiatric/Behavioral: Positive for memory loss. Negative for suicidal ideas and hallucinations. The patient is nervous/anxious.     Past Medical History  Diagnosis Date  . Arthritis   . Depression   . Cataract   . Thyroid disease   . Anxiety   . Osteoporosis   . Hypertension   . Atherosclerotic cerebrovascular disease   . Hyperlipidemia   . Pancreatitis   . Other sleep disturbances   . Backache, unspecified   . Hemorrhage of rectum and anus 03/26/2013  . Diseases of lips 03/26/2013  . Senile osteoporosis     with old T11 fracture  . Chronic kidney disease, unspecified ([place])   . Other malaise and fatigue   .  Anorexia   . Insomnia, unspecified   . Other symptoms involving cardiovascular system   . Other specified cardiac dysrhythmias(427.89)   . Diaphragmatic hernia without mention of obstruction or gangrene   . Macular degeneration (senile) of retina, unspecified   . Personal history of other diseases of digestive system     pancreatitis from gallstones  . Cholelithiasis   . Unspecified disorders of arteries and arterioles   . Other specified personal history presenting hazards to health(V15.89)   . Personal history of other diseases of circulatory system   . Unspecified hemorrhoids without mention of complication   . Unspecified hearing loss   . Anemia, unspecified   . Rosacea   . Personal history of traumatic fracture   . Personal history of other diseases of respiratory system   . Diverticulosis of colon (without mention of hemorrhage)   . Laceration of occipital scalp 11/12/2013  . Macular degeneration 06/28/2013  . Closed fracture of right inferior pubic ramus ([place]) 11/12/2013    11/10/13 s/p fall, ED eval  X-ray R hip  1. Possible nondisplaced right inferior pubic ramus fracture.  2. No femur fracture or dislocation   02/06/14 dc prn Motrin and Norco-not used      Patient Active Problem List   Diagnosis Date Noted  . Pruritus 11/06/2014  . Anemia of chronic disease 06/28/2013  . Chronic renal disease 06/28/2013  . Hypothyroidism 06/28/2013  . Constipation 06/28/2013  . Depression with anxiety 06/28/2013  . Senile dementia  06/28/2013  . Osteoporosis, unspecified 06/28/2013  . Insomnia 06/28/2013  . Urinary frequency 06/28/2013  . Hiatal hernia 06/28/2013  . SVT (supraventricular tachycardia) ([place]) 06/28/2013  . Hx of syncope 06/28/2013  . Hemorrhoids 06/28/2013  . HOH (hard of hearing) 06/28/2013  . Diverticulosis 06/28/2013  . Hx of pancreatitis 06/28/2013  . HTN (hypertension) 10/18/2012  . Hypercholesteremia 10/18/2012  . Carotid atherosclerosis 10/18/2012  . H/O acute  pancreatitis 10/18/2012    Allergies  Allergen Reactions  . Ace Inhibitors     cough  . Mirtazapine     Nightmares   . Penicillins Rash    Medications: Patient's Medications  New Prescriptions   No medications on file  Previous Medications   ACETAMINOPHEN (TYLENOL) 325 MG TABLET    Take 650 mg by mouth every 6 (six) hours as needed.   AMLODIPINE (NORVASC) 10 MG TABLET    Take 10 mg by mouth daily.   ASPIRIN 81 MG TABLET    Take 81 mg by mouth daily.   BYSTOLIC 10 MG TABLET    Take one tablet daily for blood pressusre   CLINDAMYCIN (CLEOCIN) 150 MG CAPSULE    Take 150 mg by mouth. 4 caps one hour prior dental   ESCITALOPRAM (LEXAPRO) 10 MG TABLET    Take one tablet daily   LEVOTHYROXINE (SYNTHROID, LEVOTHROID) 25 MCG TABLET    Take 25 mcg by mouth daily.   MEMANTINE (NAMENDA XR) 28 MG CP24 24 HR CAPSULE    Take 28 mg by mouth.   POLYETHYLENE GLYCOL (MIRALAX / GLYCOLAX) PACKET    Take 17 g by mouth every 3 (three) days.    RESTASIS 0.05 % OPHTHALMIC EMULSION    One drop both eyes twice daily  Modified Medications   No medications on file  Discontinued Medications   No medications on file    Physical Exam: Filed Vitals:   12/23/14 1501  BP: 130/72  Pulse: 60  Temp: 97.2 F (36.2 C)  TempSrc: Tympanic  Resp: 16   There is no weight on file to calculate BMI.  Physical Exam  Constitutional: She is oriented to person, place, and time. She appears well-developed and well-nourished. No distress.  HENT:  Head: Normocephalic and atraumatic.  Right Ear: External ear normal.  Left Ear: External ear normal.  Nose: Nose normal.  Mouth/Throat: Oropharynx is clear and moist. No oropharyngeal exudate.  Eyes: Conjunctivae and EOM are normal. Pupils are equal, round, and reactive to light. Right eye exhibits no discharge. Left eye exhibits no discharge. No scleral icterus.  Neck: Normal range of motion. Neck supple. No JVD present. No tracheal deviation present. No thyromegaly  present.  Cardiovascular: Normal rate, regular rhythm and intact distal pulses.  Exam reveals no friction rub.   Murmur heard. 2/6 systolic murmur  Pulmonary/Chest: Effort normal and breath sounds normal. No stridor. No respiratory distress. She has no wheezes. She has no rales.  Abdominal: Soft. Bowel sounds are normal. She exhibits no distension and no mass. There is no tenderness. There is no rebound and no guarding.  Genitourinary: Guaiac negative stool.  Musculoskeletal: Normal range of motion. She exhibits no edema or tenderness.  Lymphadenopathy:    She has no cervical adenopathy.  Neurological: She is alert and oriented to person, place, and time. She has normal reflexes. No cranial nerve deficit. She exhibits abnormal muscle tone. Coordination normal.  Skin: Skin is warm. No rash noted. She is not diaphoretic. No erythema. No pallor.  Lemon sized Lentigo to  left lower cheek Scratched injuries neck and upper back  Psychiatric: Her mood appears anxious. Her affect is angry. Her affect is not blunt, not labile and not inappropriate. Her speech is not delayed, not tangential and not slurred. She is not agitated, not aggressive, not hyperactive, not slowed, not withdrawn, not actively hallucinating and not combative. Thought content is paranoid. Thought content is not delusional. Cognition and memory are impaired. She does not express impulsivity or inappropriate judgment. She exhibits a depressed mood. She expresses no homicidal and no suicidal ideation. She expresses no suicidal plans and no homicidal plans. She is communicative. She exhibits abnormal recent memory and abnormal remote memory.  Stabilized in MCU She is attentive.    Labs reviewed: Basic Metabolic Panel:  Recent Labs  03/26/14  NA 142  K 4.5  BUN 32*  CREATININE 1.1    Liver Function Tests:  Recent Labs  03/26/14  AST 24  ALT 15  ALKPHOS 79    CBC:  Recent Labs  03/26/14  WBC 4.9  HGB 10.9*  HCT 33*    PLT 210    Lab Results  Component Value Date   TSH 1.98 08/20/2014   No results found for: HGBA1C No results found for: CHOL, HDL, LDLCALC, LDLDIRECT, TRIG, CHOLHDL  Significant Diagnostic Results since last visit: none  Patient Care Team: Estill Dooms, MD as PCP - General (Internal Medicine) Noemi Bellissimo X, NP as Nurse Practitioner (Nurse Practitioner) Wilford Corner, MD as Consulting Physician (Gastroenterology) Sanda Klein, MD as Consulting Physician (Cardiology)  Assessment/Plan Problem List Items Addressed This Visit    Anemia of chronic disease    Update CBC      Constipation    Stable, continue MiraLax q3 days       Depression with anxiety    Mood is stable, continue Lexapro      Hiatal hernia    Asymptomatic, off acid reducer.       HTN (hypertension)    Controlled. Continue Novasc 78m, Bystolic 10mg  daily for Bp. Off Diovan/HCT. Takes Atorvastatin and ASA for cardiovascular risk reduction, update CMP      Hypothyroidism - Primary    Continue Levothyroxine 53mcg daily. TSH 1.98 08/20/14, update TSH, c/o hair fell out.       Senile dementia    -stable in SNF, walking with walker a lot through out day, continue Namenda 28mg  daily.           Family/ staff Communication: continue SNF MCU for care needs.   Labs/tests ordered: CBC, CMP, TSH  ManXie Yechiel Erny NP Geriatrics [facility] [address], [place] [address] On Call:  [phone] & follow prompts after 5pm & weekends Office Phone:  [phone] Office Fax:  [phone]

## 2014-12-25 NOTE — Assessment & Plan Note (Signed)
Asymptomatic, off acid reducer    

## 2014-12-25 NOTE — Assessment & Plan Note (Signed)
-  stable in SNF, walking with walker a lot through out day, continue Namenda 28mg  daily.

## 2014-12-25 NOTE — Assessment & Plan Note (Signed)
Update CBC. 

## 2014-12-25 NOTE — Assessment & Plan Note (Signed)
Mood is stable, continue Lexapro

## 2014-12-25 NOTE — Assessment & Plan Note (Signed)
Stable, continue MiraLax q 3 days.  

## 2014-12-25 NOTE — Assessment & Plan Note (Signed)
Continue Levothyroxine 21mcg daily. TSH 1.98 08/20/14, update TSH, c/o hair fell out.

## 2014-12-25 NOTE — Assessment & Plan Note (Signed)
Controlled. Continue Novasc 49m, Bystolic 10mg  daily for Bp. Off Diovan/HCT. Takes Atorvastatin and ASA for cardiovascular risk reduction, update CMP

## 2014-12-26 LAB — HEPATIC FUNCTION PANEL
ALT: 12 U/L (ref 7–35)
AST: 21 U/L (ref 13–35)
Alkaline Phosphatase: 79 U/L (ref 25–125)
BILIRUBIN, TOTAL: 0.4 mg/dL

## 2014-12-26 LAB — BASIC METABOLIC PANEL
BUN: 38 mg/dL — AB (ref 4–21)
Creatinine: 1.2 mg/dL — AB (ref 0.5–1.1)
GLUCOSE: 80 mg/dL
Potassium: 4.4 mmol/L (ref 3.4–5.3)
SODIUM: 141 mmol/L (ref 137–147)

## 2014-12-26 LAB — CBC AND DIFFERENTIAL
HEMATOCRIT: 34 % — AB (ref 36–46)
HEMOGLOBIN: 11.1 g/dL — AB (ref 12.0–16.0)
Platelets: 232 10*3/uL (ref 150–399)
WBC: 4.1 10^3/mL

## 2014-12-26 LAB — TSH: TSH: 2.08 u[IU]/mL (ref 0.41–5.90)

## 2014-12-30 ENCOUNTER — Other Ambulatory Visit: Payer: Self-pay | Admitting: Nurse Practitioner

## 2014-12-30 DIAGNOSIS — E039 Hypothyroidism, unspecified: Secondary | ICD-10-CM

## 2014-12-30 DIAGNOSIS — N182 Chronic kidney disease, stage 2 (mild): Secondary | ICD-10-CM

## 2014-12-30 DIAGNOSIS — D638 Anemia in other chronic diseases classified elsewhere: Secondary | ICD-10-CM

## 2015-01-13 ENCOUNTER — Non-Acute Institutional Stay (SKILLED_NURSING_FACILITY): Payer: PPO | Admitting: Internal Medicine

## 2015-01-13 ENCOUNTER — Encounter: Payer: Self-pay | Admitting: Internal Medicine

## 2015-01-13 DIAGNOSIS — I10 Essential (primary) hypertension: Secondary | ICD-10-CM | POA: Diagnosis not present

## 2015-01-13 DIAGNOSIS — N182 Chronic kidney disease, stage 2 (mild): Secondary | ICD-10-CM

## 2015-01-13 DIAGNOSIS — E039 Hypothyroidism, unspecified: Secondary | ICD-10-CM

## 2015-01-13 DIAGNOSIS — F0390 Unspecified dementia without behavioral disturbance: Secondary | ICD-10-CM

## 2015-01-13 DIAGNOSIS — D638 Anemia in other chronic diseases classified elsewhere: Secondary | ICD-10-CM | POA: Diagnosis not present

## 2015-01-13 NOTE — Progress Notes (Signed)
Patient ID: Patricia Nelson, female   DOB: [birth year], 81 y.o.   MRN: [id number]    HISTORY AND PHYSICAL  Location:  [facility] Room Number: 107 Place of Service: SNF (31)   Extended Emergency Contact Information Primary Emergency Contact: [name],Pat Address: [address]          [place] [address] [name] of [place] Phone: [phone] Mobile Phone: [phone] Relation: Daughter  Advanced Directive information Does patient have an advance directive?: Yes, Type of Advance Directive: Living will;Out of facility DNR (pink MOST or yellow form), Pre-existing out of facility DNR order (yellow form or pink MOST form): Yellow form placed in chart (order not valid for inpatient use), Does patient want to make changes to advanced directive?: No - Patient declined  Chief Complaint  Patient presents with  . Annual Exam    HPI:  Senile dementia, without behavioral disturbance - unchanged. Gradually worsening.  Essential hypertension - controlled  Chronic renal disease, stage 2 (mild) - stable  Anemia of chronic disease - last hemoglobin 11.1  Hypothyroidism, unspecified hypothyroidism type - last TSH normal    Past Medical History  Diagnosis Date  . Arthritis   . Depression   . Cataract   . Thyroid disease   . Anxiety   . Osteoporosis   . Hypertension   . Atherosclerotic cerebrovascular disease   . Hyperlipidemia   . Pancreatitis   . Other sleep disturbances   . Backache, unspecified   . Hemorrhage of rectum and anus 03/26/2013  . Diseases of lips 03/26/2013  . Senile osteoporosis     with old T11 fracture  . Chronic kidney disease, unspecified ([place])   . Other malaise and fatigue   . Anorexia   . Insomnia, unspecified   . Other symptoms involving cardiovascular system   . Other specified cardiac dysrhythmias(427.89)   . Diaphragmatic hernia without mention of obstruction or gangrene   . Macular degeneration (senile) of retina,  unspecified   . Personal history of other diseases of digestive system     pancreatitis from gallstones  . Cholelithiasis   . Unspecified disorders of arteries and arterioles   . Other specified personal history presenting hazards to health(V15.89)   . Personal history of other diseases of circulatory system   . Unspecified hemorrhoids without mention of complication   . Unspecified hearing loss   . Anemia, unspecified   . Rosacea   . Personal history of traumatic fracture   . Personal history of other diseases of respiratory system   . Diverticulosis of colon (without mention of hemorrhage)   . Laceration of occipital scalp 11/12/2013  . Macular degeneration 06/28/2013  . Closed fracture of right inferior pubic ramus ([place]) 11/12/2013    11/10/13 s/p fall, ED eval  X-ray R hip  1. Possible nondisplaced right inferior pubic ramus fracture.  2. No femur fracture or dislocation   02/06/14 dc prn Motrin and Norco-not used      Past Surgical History  Procedure Laterality Date  . Abdominal hysterectomy      and BSO fibroids  . Joint replacement  1996  . Colonoscopy  05/23/2007  . Total hip arthroplasty Right     Patient Care Team: Estill Dooms, MD as PCP - General (Internal Medicine) Man Mast X, NP as Nurse Practitioner (Nurse Practitioner) Wilford Corner, MD as Consulting Physician (Gastroenterology) Sanda Klein, MD as Consulting Physician (Cardiology)  Social History   Social History  . Marital Status:  Widowed    Spouse Name: N/A  . Number of Children: N/A  . Years of Education: N/A   Occupational History  . retired Freight forwarder    Social History Main Topics  . Smoking status: Never Smoker   . Smokeless tobacco: Not on file  . Alcohol Use: No  . Drug Use: No  . Sexual Activity: No   Other Topics Concern  . Not on file   Social History Narrative   Lives at [facility]   Widowed   Caffeine   Exercise: none    reports that she has  never smoked. She does not have any smokeless tobacco history on file. She reports that she does not drink alcohol or use illicit drugs.  Family History  Problem Relation Age of Onset  . Heart disease Mother   . Diabetes Mother   . Heart disease Father   . Diabetes Sister   . Diabetes Brother   . Diabetes Daughter    Family Status  Relation Status Death Age  . Mother Deceased   . Father Deceased   . Sister Deceased   . Brother Other   . Daughter Alive     Immunization History  Administered Date(s) Administered  . DTaP 10/12/2012  . Influenza-Unspecified 11/22/2012, 12/26/2013  . Pneumococcal Polysaccharide-23 09/22/2000  . Td 07/08/2005  . Tdap 11/10/2013    Allergies  Allergen Reactions  . Ace Inhibitors     cough  . Mirtazapine     Nightmares   . Penicillins Rash    Medications: Patient's Medications  New Prescriptions   No medications on file  Previous Medications   ACETAMINOPHEN (TYLENOL) 325 MG TABLET    Take 650 mg by mouth every 6 (six) hours as needed.   AMLODIPINE (NORVASC) 10 MG TABLET    Take 10 mg by mouth daily.   ASPIRIN 81 MG TABLET    Take 81 mg by mouth daily.   BYSTOLIC 10 MG TABLET    Take one tablet daily for blood pressusre   CLINDAMYCIN (CLEOCIN) 150 MG CAPSULE    Take 150 mg by mouth. 4 caps one hour prior dental   ESCITALOPRAM (LEXAPRO) 10 MG TABLET    Take one tablet daily   LEVOTHYROXINE (SYNTHROID, LEVOTHROID) 25 MCG TABLET    Take 25 mcg by mouth daily.   MEMANTINE (NAMENDA XR) 28 MG CP24 24 HR CAPSULE    Take 28 mg by mouth.   POLYETHYLENE GLYCOL (MIRALAX / GLYCOLAX) PACKET    Take 17 g by mouth every 3 (three) days.    RESTASIS 0.05 % OPHTHALMIC EMULSION    One drop both eyes twice daily  Modified Medications   No medications on file  Discontinued Medications   No medications on file    Review of Systems  Constitutional: Negative for fever, chills and diaphoresis.  HENT: Positive for hearing loss. Negative for congestion, ear  discharge, ear pain, nosebleeds, sore throat and tinnitus.   Eyes: Negative for photophobia, pain, discharge and redness.  Respiratory: Negative for cough, shortness of breath and stridor.   Cardiovascular: Negative for chest pain, palpitations and leg swelling.  Gastrointestinal: Negative for nausea, vomiting, abdominal pain, diarrhea, constipation and blood in stool.  Endocrine: Negative for polydipsia.  Genitourinary: Positive for frequency. Negative for dysuria, urgency, hematuria and flank pain.  Musculoskeletal: Negative for myalgias, back pain and neck pain.       Ambulates with walker.   Skin: Negative for rash.  Neck and upper back itching and scratched injuries  Allergic/Immunologic: Negative for environmental allergies.  Neurological: Negative for dizziness, tremors, seizures, weakness and headaches.  Hematological: Does not bruise/bleed easily.  Psychiatric/Behavioral: Positive for confusion. Negative for suicidal ideas, hallucinations and agitation. The patient is nervous/anxious.     Filed Vitals:   01/13/15 1624  BP: 120/62  Pulse: 76  Resp: 20  Height: 5' (1.524 m)  Weight: 125 lb 6.4 oz (56.881 kg)   Body mass index is 24.49 kg/(m^2).  Physical Exam  Constitutional: She is oriented to person, place, and time. She appears well-developed and well-nourished. No distress.  HENT:  Head: Normocephalic and atraumatic.  Right Ear: External ear normal.  Left Ear: External ear normal.  Nose: Nose normal.  Mouth/Throat: Oropharynx is clear and moist. No oropharyngeal exudate.  Eyes: Conjunctivae and EOM are normal. Pupils are equal, round, and reactive to light. Right eye exhibits no discharge. Left eye exhibits no discharge. No scleral icterus.  Neck: Normal range of motion. Neck supple. No JVD present. No tracheal deviation present. No thyromegaly present.  Cardiovascular: Normal rate, regular rhythm and intact distal pulses.  Exam reveals no friction rub.   Murmur  heard. 2/6 systolic murmur  Pulmonary/Chest: Effort normal and breath sounds normal. No stridor. No respiratory distress. She has no wheezes. She has no rales.  Abdominal: Soft. Bowel sounds are normal. She exhibits no distension and no mass. There is no tenderness. There is no rebound and no guarding.  Genitourinary: Guaiac negative stool.  Musculoskeletal: Normal range of motion. She exhibits no edema or tenderness.  Lymphadenopathy:    She has no cervical adenopathy.  Neurological: She is alert and oriented to person, place, and time. She has normal reflexes. No cranial nerve deficit. She exhibits abnormal muscle tone. Coordination normal.  12/31/2013 MMSE 11/30  Skin: Skin is warm. No rash noted. She is not diaphoretic. No erythema. No pallor.  Lemon sized Lentigo to left lower cheek Scratched injuries neck and upper back  Psychiatric: Her mood appears anxious. Her affect is angry. Her affect is not blunt, not labile and not inappropriate. Her speech is not delayed, not tangential and not slurred. She is not agitated, not aggressive, not hyperactive, not slowed, not withdrawn, not actively hallucinating and not combative. Thought content is paranoid. Thought content is not delusional. Cognition and memory are impaired. She does not express impulsivity or inappropriate judgment. She exhibits a depressed mood. She expresses no homicidal and no suicidal ideation. She expresses no suicidal plans and no homicidal plans. She is communicative. She exhibits abnormal recent memory and abnormal remote memory.  Stabilized in MCU She is attentive.    Labs reviewed: Lab Summary Latest Ref Rng 12/26/2014 03/26/2014 11/10/2013  Hemoglobin 12.0 - 16.0 g/dL 11.1(A) 10.9(A) 10.9(L)  Hematocrit 36 - 46 % 34(A) 33(A) 32.9(L)  White count - 4.1 4.9 11.6(H)  Platelet count 150 - 399 K/L 232 210 154  Sodium 137 - 147 mmol/L 141 142 142  Potassium 3.4 - 5.3 mmol/L 4.4 4.5 3.6(L)  Calcium 8.4 - 10.5 mg/dL (None)  (None) 9.9  Phosphorus - (None) (None) (None)  Creatinine 0.5 - 1.1 mg/dL 1.2(A) 1.1 1.22(H)  AST 13 - 35 U/L 21 24 32  Alk Phos 25 - 125 U/L 79 79 64  Bilirubin 0.3 - 1.2 mg/dL (None) (None) 0.6  Glucose - 80 79 120(H)  Cholesterol - (None) (None) (None)  HDL cholesterol - (None) (None) (None)  Triglycerides - (None) (None) (None)  LDL  Direct - (None) (None) (None)  LDL Calc - (None) (None) (None)  Total protein 6.0 - 8.3 g/dL (None) (None) 6.6  Albumin 3.5 - 5.2 g/dL (None) (None) 3.4(L)   Lab Results  Component Value Date   BUN 38* 12/26/2014   No results found for: HGBA1C Lab Results  Component Value Date   TSH 2.08 12/26/2014     Assessment/Plan  1. Senile dementia, without behavioral disturbance Unchanged  2. Essential hypertension Controlled  3. Chronic renal disease, stage 2 (mild) Stable  4. Anemia of chronic disease Stable  5. Hypothyroidism, unspecified hypothyroidism type Compensated

## 2015-01-20 ENCOUNTER — Non-Acute Institutional Stay (SKILLED_NURSING_FACILITY): Payer: PPO | Admitting: Nurse Practitioner

## 2015-01-20 ENCOUNTER — Encounter: Payer: Self-pay | Admitting: Nurse Practitioner

## 2015-01-20 DIAGNOSIS — K449 Diaphragmatic hernia without obstruction or gangrene: Secondary | ICD-10-CM

## 2015-01-20 DIAGNOSIS — E039 Hypothyroidism, unspecified: Secondary | ICD-10-CM

## 2015-01-20 DIAGNOSIS — F418 Other specified anxiety disorders: Secondary | ICD-10-CM

## 2015-01-20 DIAGNOSIS — D638 Anemia in other chronic diseases classified elsewhere: Secondary | ICD-10-CM

## 2015-01-20 DIAGNOSIS — I10 Essential (primary) hypertension: Secondary | ICD-10-CM

## 2015-01-20 DIAGNOSIS — K5900 Constipation, unspecified: Secondary | ICD-10-CM | POA: Diagnosis not present

## 2015-01-20 DIAGNOSIS — F0390 Unspecified dementia without behavioral disturbance: Secondary | ICD-10-CM

## 2015-01-20 DIAGNOSIS — N182 Chronic kidney disease, stage 2 (mild): Secondary | ICD-10-CM

## 2015-01-20 NOTE — Progress Notes (Signed)
Patient ID: Patricia Nelson, female   DOB: [birth year], 81 y.o.   MRN: [id number]  Location:  SNF [facility] Provider:  Marlana Latus NP  Code Status:  DNR Goals of care: Advanced Directive information    Chief Complaint  Patient presents with  . Medical Management of Chronic Issues     HPI: Patient is a 81 y.o. female seen in the SNF at [facility] today for evaluation of c/o hair fell out, Hx of hypothyroidism, HTN, dementia, depression.   Review of Systems  Constitutional: Negative for fever and chills.  HENT: Positive for hearing loss. Negative for congestion, ear discharge, ear pain, nosebleeds and sore throat.   Eyes: Negative for pain, discharge and redness.  Respiratory: Negative for cough, shortness of breath and stridor.   Cardiovascular: Negative for chest pain, palpitations and leg swelling.  Gastrointestinal: Negative for nausea, vomiting, abdominal pain, diarrhea and constipation.  Genitourinary: Positive for frequency. Negative for dysuria, urgency and flank pain.  Musculoskeletal: Negative for myalgias, back pain and neck pain.       Ambulates with walker.   Skin: Negative for itching and rash.       Neck and upper back itching resolved  Neurological: Negative for dizziness, tremors, seizures, weakness and headaches.  Psychiatric/Behavioral: Positive for memory loss. Negative for suicidal ideas and hallucinations. The patient is nervous/anxious.     Past Medical History  Diagnosis Date  . Arthritis   . Depression   . Cataract   . Thyroid disease   . Anxiety   . Osteoporosis   . Hypertension   . Atherosclerotic cerebrovascular disease   . Hyperlipidemia   . Pancreatitis   . Other sleep disturbances   . Backache, unspecified   . Hemorrhage of rectum and anus 03/26/2013  . Diseases of lips 03/26/2013  . Senile osteoporosis     with old T11 fracture  . Chronic kidney disease, unspecified ([place])   . Other malaise and fatigue   . Anorexia   . Insomnia,  unspecified   . Other symptoms involving cardiovascular system   . Other specified cardiac dysrhythmias(427.89)   . Diaphragmatic hernia without mention of obstruction or gangrene   . Macular degeneration (senile) of retina, unspecified   . Personal history of other diseases of digestive system     pancreatitis from gallstones  . Cholelithiasis   . Unspecified disorders of arteries and arterioles   . Other specified personal history presenting hazards to health(V15.89)   . Personal history of other diseases of circulatory system   . Unspecified hemorrhoids without mention of complication   . Unspecified hearing loss   . Anemia, unspecified   . Rosacea   . Personal history of traumatic fracture   . Personal history of other diseases of respiratory system   . Diverticulosis of colon (without mention of hemorrhage)   . Laceration of occipital scalp 11/12/2013  . Macular degeneration 06/28/2013  . Closed fracture of right inferior pubic ramus ([place]) 11/12/2013    11/10/13 s/p fall, ED eval  X-ray R hip  1. Possible nondisplaced right inferior pubic ramus fracture.  2. No femur fracture or dislocation   02/06/14 dc prn Motrin and Norco-not used      Patient Active Problem List   Diagnosis Date Noted  . Pruritus 11/06/2014  . Anemia of chronic disease 06/28/2013  . Chronic renal disease 06/28/2013  . Hypothyroidism 06/28/2013  . Constipation 06/28/2013  . Depression with anxiety 06/28/2013  . Senile dementia 06/28/2013  .  Osteoporosis, unspecified 06/28/2013  . Insomnia 06/28/2013  . Urinary frequency 06/28/2013  . Hiatal hernia 06/28/2013  . SVT (supraventricular tachycardia) ([place]) 06/28/2013  . Hx of syncope 06/28/2013  . Hemorrhoids 06/28/2013  . HOH (hard of hearing) 06/28/2013  . Diverticulosis 06/28/2013  . Hx of pancreatitis 06/28/2013  . HTN (hypertension) 10/18/2012  . Hypercholesteremia 10/18/2012  . Carotid atherosclerosis 10/18/2012  . H/O acute pancreatitis 10/18/2012      Allergies  Allergen Reactions  . Ace Inhibitors     cough  . Mirtazapine     Nightmares   . Penicillins Rash    Medications: Patient's Medications  New Prescriptions   No medications on file  Previous Medications   ACETAMINOPHEN (TYLENOL) 325 MG TABLET    Take 650 mg by mouth every 6 (six) hours as needed.   AMLODIPINE (NORVASC) 10 MG TABLET    Take 10 mg by mouth daily.   ASPIRIN 81 MG TABLET    Take 81 mg by mouth daily.   BYSTOLIC 10 MG TABLET    Take one tablet daily for blood pressusre   CLINDAMYCIN (CLEOCIN) 150 MG CAPSULE    Take 150 mg by mouth. 4 caps one hour prior dental   ESCITALOPRAM (LEXAPRO) 10 MG TABLET    Take one tablet daily   LEVOTHYROXINE (SYNTHROID, LEVOTHROID) 25 MCG TABLET    Take 25 mcg by mouth daily.   MEMANTINE (NAMENDA XR) 28 MG CP24 24 HR CAPSULE    Take 28 mg by mouth.   POLYETHYLENE GLYCOL (MIRALAX / GLYCOLAX) PACKET    Take 17 g by mouth every 3 (three) days.    RESTASIS 0.05 % OPHTHALMIC EMULSION    One drop both eyes twice daily  Modified Medications   No medications on file  Discontinued Medications   No medications on file    Physical Exam: Filed Vitals:   01/20/15 1530  BP: 120/62  Pulse: 76  Temp: 96.6 F (35.9 C)  TempSrc: Tympanic  Resp: 20   There is no weight on file to calculate BMI.  Physical Exam  Constitutional: She is oriented to person, place, and time. She appears well-developed and well-nourished. No distress.  HENT:  Head: Normocephalic and atraumatic.  Right Ear: External ear normal.  Left Ear: External ear normal.  Nose: Nose normal.  Mouth/Throat: Oropharynx is clear and moist. No oropharyngeal exudate.  Eyes: Conjunctivae and EOM are normal. Pupils are equal, round, and reactive to light. Right eye exhibits no discharge. Left eye exhibits no discharge. No scleral icterus.  Neck: Normal range of motion. Neck supple. No JVD present. No tracheal deviation present. No thyromegaly present.  Cardiovascular:  Normal rate, regular rhythm and intact distal pulses.  Exam reveals no friction rub.   Murmur heard. 2/6 systolic murmur  Pulmonary/Chest: Effort normal and breath sounds normal. No stridor. No respiratory distress. She has no wheezes. She has no rales.  Abdominal: Soft. Bowel sounds are normal. She exhibits no distension and no mass. There is no tenderness. There is no rebound and no guarding.  Genitourinary: Guaiac negative stool.  Musculoskeletal: Normal range of motion. She exhibits no edema or tenderness.  Lymphadenopathy:    She has no cervical adenopathy.  Neurological: She is alert and oriented to person, place, and time. She has normal reflexes. No cranial nerve deficit. She exhibits abnormal muscle tone. Coordination normal.  Skin: Skin is warm. No rash noted. She is not diaphoretic. No erythema. No pallor.  Lemon sized Lentigo to left lower  cheek  Psychiatric: Her mood appears anxious. Her affect is angry. Her affect is not blunt, not labile and not inappropriate. Her speech is not delayed, not tangential and not slurred. She is not agitated, not aggressive, not hyperactive, not slowed, not withdrawn, not actively hallucinating and not combative. Thought content is paranoid. Thought content is not delusional. Cognition and memory are impaired. She does not express impulsivity or inappropriate judgment. She exhibits a depressed mood. She expresses no homicidal and no suicidal ideation. She expresses no suicidal plans and no homicidal plans. She is communicative. She exhibits abnormal recent memory and abnormal remote memory.  Stabilized in MCU She is attentive.    Labs reviewed: Basic Metabolic Panel:  Recent Labs  03/26/14 12/26/14  NA 142 141  K 4.5 4.4  BUN 32* 38*  CREATININE 1.1 1.2*    Liver Function Tests:  Recent Labs  03/26/14 12/26/14  AST 24 21  ALT 15 12  ALKPHOS 79 79    CBC:  Recent Labs  03/26/14 12/26/14  WBC 4.9 4.1  HGB 10.9* 11.1*  HCT 33* 34*    PLT 210 232    Lab Results  Component Value Date   TSH 2.08 12/26/2014   No results found for: HGBA1C No results found for: CHOL, HDL, LDLCALC, LDLDIRECT, TRIG, CHOLHDL  Significant Diagnostic Results since last visit: none  Patient Care Team: Estill Dooms, MD as PCP - General (Internal Medicine) Yamil Dougher X, NP as Nurse Practitioner (Nurse Practitioner) Wilford Corner, MD as Consulting Physician (Gastroenterology) Sanda Klein, MD as Consulting Physician (Cardiology)  Assessment/Plan Problem List Items Addressed This Visit    HTN (hypertension) - Primary (Chronic)    ontrolled. Continue Novasc 88m, Bystolic 10mg  daily for Bp. Off Diovan/HCT. Takes Atorvastatin and ASA for cardiovascular risk reduction      Anemia of chronic disease (Chronic)    07/03/13 Hgb 8.9 07/03/13. Normal Iron 52, B12 641, Folate 15.5. 12/26/14 Hgb 11.1      Chronic renal disease (Chronic)    03/26/14 bun/creat 32/1.11 12/26/14 Bun/creat 38/1.1      Hypothyroidism (Chronic)    Continue Levothyroxine 52mcg daily. TSH 1.98 08/20/14, 12/26/14 TSH 2.081      Senile dementia (Chronic)    -stable in MCU, walking with walker a lot through out day, continue Namenda 28mg  daily.       Constipation    Stable, continue MiraLax q3 days       Depression with anxiety    Mood is stable, continue Lexapro      Hiatal hernia    Asymptomatic, off acid reducer.           Family/ staff Communication: continue SNF MCU for care needs.   Labs/tests ordered: none  ManXie Nishant Schrecengost NP [facility] [address], [place] [address] On Call:  [phone] & follow prompts after 5pm & weekends Office Phone:  [phone] Office Fax:  [phone]

## 2015-01-21 NOTE — Assessment & Plan Note (Signed)
03/26/14 bun/creat 32/1.11 12/26/14 Bun/creat 38/1.1

## 2015-01-21 NOTE — Assessment & Plan Note (Addendum)
Continue Levothyroxine 61mcg daily. TSH 1.98 08/20/14, 12/26/14 TSH 2.081

## 2015-01-21 NOTE — Assessment & Plan Note (Signed)
ontrolled. Continue Novasc 47m, Bystolic 10mg  daily for Bp. Off Diovan/HCT. Takes Atorvastatin and ASA for cardiovascular risk reduction

## 2015-01-21 NOTE — Assessment & Plan Note (Signed)
Stable, continue MiraLax q 3 days.  

## 2015-01-21 NOTE — Assessment & Plan Note (Addendum)
07/03/13 Hgb 8.9 07/03/13. Normal Iron 52, B12 641, Folate 15.5. 12/26/14 Hgb 11.1

## 2015-01-21 NOTE — Assessment & Plan Note (Signed)
Asymptomatic, off acid reducer    

## 2015-01-21 NOTE — Assessment & Plan Note (Signed)
-  stable in MCU, walking with walker a lot through out day, continue Namenda 28mg  daily.

## 2015-01-21 NOTE — Assessment & Plan Note (Addendum)
Mood is stable, continue Lexapro  

## 2015-02-19 ENCOUNTER — Encounter: Payer: Self-pay | Admitting: Nurse Practitioner

## 2015-02-19 ENCOUNTER — Non-Acute Institutional Stay (SKILLED_NURSING_FACILITY): Payer: PPO | Admitting: Nurse Practitioner

## 2015-02-19 DIAGNOSIS — I10 Essential (primary) hypertension: Secondary | ICD-10-CM | POA: Diagnosis not present

## 2015-02-19 DIAGNOSIS — F418 Other specified anxiety disorders: Secondary | ICD-10-CM

## 2015-02-19 DIAGNOSIS — F0390 Unspecified dementia without behavioral disturbance: Secondary | ICD-10-CM

## 2015-02-19 DIAGNOSIS — D638 Anemia in other chronic diseases classified elsewhere: Secondary | ICD-10-CM | POA: Diagnosis not present

## 2015-02-19 DIAGNOSIS — E039 Hypothyroidism, unspecified: Secondary | ICD-10-CM

## 2015-02-19 DIAGNOSIS — K5900 Constipation, unspecified: Secondary | ICD-10-CM

## 2015-02-19 DIAGNOSIS — K449 Diaphragmatic hernia without obstruction or gangrene: Secondary | ICD-10-CM | POA: Diagnosis not present

## 2015-02-19 DIAGNOSIS — N182 Chronic kidney disease, stage 2 (mild): Secondary | ICD-10-CM

## 2015-02-19 NOTE — Assessment & Plan Note (Signed)
Stable, continue MiraLax q 3 days.  

## 2015-02-19 NOTE — Progress Notes (Signed)
Patient ID: Patricia Nelson, female   DOB: 1915/03/20, 81 y.o.   MRN: [id number]  Location:  SNF [facility] Provider:  Marlana Latus NP  Code Status:  DNR Goals of care: Advanced Directive information    Chief Complaint  Patient presents with  . Medical Management of Chronic Issues     HPI: Patient is a 81 y.o. female seen in the SNF at [facility] today for evaluation of Hx of hypothyroidism, HTN, dementia, depression.   Review of Systems  Constitutional: Negative for fever and chills.  HENT: Positive for hearing loss. Negative for congestion, ear discharge, ear pain, nosebleeds and sore throat.   Eyes: Negative for pain, discharge and redness.  Respiratory: Negative for cough, shortness of breath and stridor.   Cardiovascular: Negative for chest pain, palpitations and leg swelling.  Gastrointestinal: Negative for nausea, vomiting, abdominal pain, diarrhea and constipation.  Genitourinary: Positive for frequency. Negative for dysuria, urgency and flank pain.  Musculoskeletal: Negative for myalgias, back pain and neck pain.       Ambulates with walker.   Skin: Negative for itching and rash.       Neck and upper back itching resolved  Neurological: Negative for dizziness, tremors, seizures, weakness and headaches.  Psychiatric/Behavioral: Positive for memory loss. Negative for suicidal ideas and hallucinations. The patient is nervous/anxious.     Past Medical History  Diagnosis Date  . Arthritis   . Depression   . Cataract   . Thyroid disease   . Anxiety   . Osteoporosis   . Hypertension   . Atherosclerotic cerebrovascular disease   . Hyperlipidemia   . Pancreatitis   . Other sleep disturbances   . Backache, unspecified   . Hemorrhage of rectum and anus 03/26/2013  . Diseases of lips 03/26/2013  . Senile osteoporosis     with old T11 fracture  . Chronic kidney disease, unspecified ([place])   . Other malaise and fatigue   . Anorexia   . Insomnia, unspecified   . Other  symptoms involving cardiovascular system   . Other specified cardiac dysrhythmias(427.89)   . Diaphragmatic hernia without mention of obstruction or gangrene   . Macular degeneration (senile) of retina, unspecified   . Personal history of other diseases of digestive system     pancreatitis from gallstones  . Cholelithiasis   . Unspecified disorders of arteries and arterioles   . Other specified personal history presenting hazards to health(V15.89)   . Personal history of other diseases of circulatory system   . Unspecified hemorrhoids without mention of complication   . Unspecified hearing loss   . Anemia, unspecified   . Rosacea   . Personal history of traumatic fracture   . Personal history of other diseases of respiratory system   . Diverticulosis of colon (without mention of hemorrhage)   . Laceration of occipital scalp 11/12/2013  . Macular degeneration 06/28/2013  . Closed fracture of right inferior pubic ramus ([place]) 11/12/2013    11/10/13 s/p fall, ED eval  X-ray R hip  1. Possible nondisplaced right inferior pubic ramus fracture.  2. No femur fracture or dislocation   02/06/14 dc prn Motrin and Norco-not used      Patient Active Problem List   Diagnosis Date Noted  . Pruritus 11/06/2014  . Anemia of chronic disease 06/28/2013  . Chronic renal disease 06/28/2013  . Hypothyroidism 06/28/2013  . Constipation 06/28/2013  . Depression with anxiety 06/28/2013  . Senile dementia 06/28/2013  . Osteoporosis, unspecified 06/28/2013  .  Insomnia 06/28/2013  . Urinary frequency 06/28/2013  . Hiatal hernia 06/28/2013  . SVT (supraventricular tachycardia) ([place]) 06/28/2013  . Hx of syncope 06/28/2013  . Hemorrhoids 06/28/2013  . HOH (hard of hearing) 06/28/2013  . Diverticulosis 06/28/2013  . Hx of pancreatitis 06/28/2013  . HTN (hypertension) 10/18/2012  . Hypercholesteremia 10/18/2012  . Carotid atherosclerosis 10/18/2012  . H/O acute pancreatitis 10/18/2012    Allergies    Allergen Reactions  . Ace Inhibitors     cough  . Mirtazapine     Nightmares   . Penicillins Rash    Medications: Patient's Medications  New Prescriptions   No medications on file  Previous Medications   ACETAMINOPHEN (TYLENOL) 325 MG TABLET    Take 650 mg by mouth every 6 (six) hours as needed.   AMLODIPINE (NORVASC) 10 MG TABLET    Take 10 mg by mouth daily.   ASPIRIN 81 MG TABLET    Take 81 mg by mouth daily.   BYSTOLIC 10 MG TABLET    Take one tablet daily for blood pressusre   CLINDAMYCIN (CLEOCIN) 150 MG CAPSULE    Take 150 mg by mouth. 4 caps one hour prior dental   ESCITALOPRAM (LEXAPRO) 10 MG TABLET    Take one tablet daily   LEVOTHYROXINE (SYNTHROID, LEVOTHROID) 25 MCG TABLET    Take 25 mcg by mouth daily.   MEMANTINE (NAMENDA XR) 28 MG CP24 24 HR CAPSULE    Take 28 mg by mouth.   POLYETHYLENE GLYCOL (MIRALAX / GLYCOLAX) PACKET    Take 17 g by mouth every 3 (three) days.    RESTASIS 0.05 % OPHTHALMIC EMULSION    One drop both eyes twice daily  Modified Medications   No medications on file  Discontinued Medications   No medications on file    Physical Exam: Filed Vitals:   02/19/15 1354  BP: 124/62  Pulse: 70  Temp: 98.3 F (36.8 C)  TempSrc: Tympanic  Resp: 18   There is no weight on file to calculate BMI.  Physical Exam  Constitutional: She is oriented to person, place, and time. She appears well-developed and well-nourished. No distress.  HENT:  Head: Normocephalic and atraumatic.  Right Ear: External ear normal.  Left Ear: External ear normal.  Nose: Nose normal.  Mouth/Throat: Oropharynx is clear and moist. No oropharyngeal exudate.  Eyes: Conjunctivae and EOM are normal. Pupils are equal, round, and reactive to light. Right eye exhibits no discharge. Left eye exhibits no discharge. No scleral icterus.  Neck: Normal range of motion. Neck supple. No JVD present. No tracheal deviation present. No thyromegaly present.  Cardiovascular: Normal rate,  regular rhythm and intact distal pulses.  Exam reveals no friction rub.   Murmur heard. 2/6 systolic murmur  Pulmonary/Chest: Effort normal and breath sounds normal. No stridor. No respiratory distress. She has no wheezes. She has no rales.  Abdominal: Soft. Bowel sounds are normal. She exhibits no distension and no mass. There is no tenderness. There is no rebound and no guarding.  Genitourinary: Guaiac negative stool.  Musculoskeletal: Normal range of motion. She exhibits no edema or tenderness.  Lymphadenopathy:    She has no cervical adenopathy.  Neurological: She is alert and oriented to person, place, and time. She has normal reflexes. No cranial nerve deficit. She exhibits abnormal muscle tone. Coordination normal.  Skin: Skin is warm. No rash noted. She is not diaphoretic. No erythema. No pallor.  Lemon sized Lentigo to left lower cheek  Psychiatric: Her mood  appears anxious. Her affect is angry. Her affect is not blunt, not labile and not inappropriate. Her speech is not delayed, not tangential and not slurred. She is not agitated, not aggressive, not hyperactive, not slowed, not withdrawn, not actively hallucinating and not combative. Thought content is paranoid. Thought content is not delusional. Cognition and memory are impaired. She does not express impulsivity or inappropriate judgment. She exhibits a depressed mood. She expresses no homicidal and no suicidal ideation. She expresses no suicidal plans and no homicidal plans. She is communicative. She exhibits abnormal recent memory and abnormal remote memory.  Stabilized in MCU She is attentive.    Labs reviewed: Basic Metabolic Panel:  Recent Labs  03/26/14 12/26/14  NA 142 141  K 4.5 4.4  BUN 32* 38*  CREATININE 1.1 1.2*    Liver Function Tests:  Recent Labs  03/26/14 12/26/14  AST 24 21  ALT 15 12  ALKPHOS 79 79    CBC:  Recent Labs  03/26/14 12/26/14  WBC 4.9 4.1  HGB 10.9* 11.1*  HCT 33* 34*  PLT 210 232      Lab Results  Component Value Date   TSH 2.08 12/26/2014   No results found for: HGBA1C No results found for: CHOL, HDL, LDLCALC, LDLDIRECT, TRIG, CHOLHDL  Significant Diagnostic Results since last visit: none  Patient Care Team: Estill Dooms, MD as PCP - General (Internal Medicine) Million Maharaj X, NP as Nurse Practitioner (Nurse Practitioner) Wilford Corner, MD as Consulting Physician (Gastroenterology) Sanda Klein, MD as Consulting Physician (Cardiology)  Assessment/Plan Problem List Items Addressed This Visit    Senile dementia - Primary (Chronic)    -stable in MCU, walking with walker a lot through out day, continue Namenda 28mg  daily.       Hypothyroidism (Chronic)    Continue Levothyroxine 72mcg daily. TSH 1.98 08/20/14, 12/26/14 TSH 2.081      HTN (hypertension) (Chronic)    controlled. Continue Novasc 69m, Bystolic 10mg  daily for Bp. Off Diovan/HCT. Takes Atorvastatin and ASA for cardiovascular risk reduction      Hiatal hernia    Asymptomatic, off acid reducer.       Depression with anxiety    Mood is stable, continue Lexapro      Constipation    Stable, continue MiraLax q3 days       Chronic renal disease (Chronic)    03/26/14 bun/creat 32/1.11 12/26/14 Bun/creat 38/1.1      Anemia of chronic disease (Chronic)    07/03/13 Hgb 8.9 07/03/13. Normal Iron 52, B12 641, Folate 15.5. 12/26/14 Hgb 11.          Family/ staff Communication: continue SNF MCU for care needs.   Labs/tests ordered: none  ManXie Ender Rorke NP [facility] [address], [place] [address] On Call:  [phone] & follow prompts after 5pm & weekends Office Phone:  [phone] Office Fax:  [phone]

## 2015-02-19 NOTE — Assessment & Plan Note (Signed)
controlled. Continue Novasc 54m, Bystolic 10mg  daily for Bp. Off Diovan/HCT. Takes Atorvastatin and ASA for cardiovascular risk reduction

## 2015-02-19 NOTE — Assessment & Plan Note (Signed)
07/03/13 Hgb 8.9 07/03/13. Normal Iron 52, B12 641, Folate 15.5. 12/26/14 Hgb 11.

## 2015-02-19 NOTE — Assessment & Plan Note (Signed)
Mood is stable, continue Lexapro  

## 2015-02-19 NOTE — Assessment & Plan Note (Signed)
03/26/14 bun/creat 32/1.11 12/26/14 Bun/creat 38/1.1

## 2015-02-19 NOTE — Assessment & Plan Note (Signed)
Asymptomatic, off acid reducer    

## 2015-02-19 NOTE — Assessment & Plan Note (Signed)
-  stable in MCU, walking with walker a lot through out day, continue Namenda 28mg  daily.

## 2015-02-19 NOTE — Assessment & Plan Note (Signed)
Continue Levothyroxine 44mcg daily. TSH 1.98 08/20/14, 12/26/14 TSH 2.081

## 2015-03-19 ENCOUNTER — Encounter: Payer: Self-pay | Admitting: Nurse Practitioner

## 2015-03-19 ENCOUNTER — Non-Acute Institutional Stay (SKILLED_NURSING_FACILITY): Payer: PPO | Admitting: Nurse Practitioner

## 2015-03-19 DIAGNOSIS — F0390 Unspecified dementia without behavioral disturbance: Secondary | ICD-10-CM | POA: Diagnosis not present

## 2015-03-19 DIAGNOSIS — E039 Hypothyroidism, unspecified: Secondary | ICD-10-CM

## 2015-03-19 DIAGNOSIS — K5900 Constipation, unspecified: Secondary | ICD-10-CM | POA: Diagnosis not present

## 2015-03-19 DIAGNOSIS — F418 Other specified anxiety disorders: Secondary | ICD-10-CM | POA: Diagnosis not present

## 2015-03-19 DIAGNOSIS — K449 Diaphragmatic hernia without obstruction or gangrene: Secondary | ICD-10-CM

## 2015-03-19 DIAGNOSIS — I10 Essential (primary) hypertension: Secondary | ICD-10-CM

## 2015-03-21 NOTE — Assessment & Plan Note (Signed)
Mood is stable, continue Lexapro  

## 2015-03-21 NOTE — Progress Notes (Signed)
Patient ID: Patricia Nelson, female   DOB: August 28, 1915, 82 y.o.   MRN: [id number]  Location:  SNF [facility] Provider:  Marlana Latus NP  Code Status:  DNR Goals of care: Advanced Directive information Does patient have an advance directive?: Yes, Type of Advance Directive: Out of facility DNR (pink MOST or yellow form)  Chief Complaint  Patient presents with  . Medical Management of Chronic Issues    Routine visit     HPI: Patient is a 82 y.o. female seen in the SNF at [facility] today for evaluation of Hx of hypothyroidism, HTN, dementia, depression.   Review of Systems  Constitutional: Negative for fever and chills.  HENT: Positive for hearing loss. Negative for congestion, ear discharge, ear pain, nosebleeds and sore throat.   Eyes: Negative for pain, discharge and redness.  Respiratory: Negative for cough, shortness of breath and stridor.   Cardiovascular: Negative for chest pain, palpitations and leg swelling.  Gastrointestinal: Negative for nausea, vomiting, abdominal pain, diarrhea and constipation.  Genitourinary: Positive for frequency. Negative for dysuria, urgency and flank pain.  Musculoskeletal: Negative for myalgias, back pain and neck pain.       Ambulates with walker.   Skin: Negative for itching and rash.       Neck and upper back itching resolved  Neurological: Negative for dizziness, tremors, seizures, weakness and headaches.  Psychiatric/Behavioral: Positive for memory loss. Negative for suicidal ideas and hallucinations. The patient is nervous/anxious.     Past Medical History  Diagnosis Date  . Arthritis   . Depression   . Cataract   . Thyroid disease   . Anxiety   . Osteoporosis   . Hypertension   . Atherosclerotic cerebrovascular disease   . Hyperlipidemia   . Pancreatitis   . Other sleep disturbances   . Backache, unspecified   . Hemorrhage of rectum and anus 03/26/2013  . Diseases of lips 03/26/2013  . Senile osteoporosis     with old T11  fracture  . Chronic kidney disease, unspecified ([place])   . Other malaise and fatigue   . Anorexia   . Insomnia, unspecified   . Other symptoms involving cardiovascular system   . Other specified cardiac dysrhythmias(427.89)   . Diaphragmatic hernia without mention of obstruction or gangrene   . Macular degeneration (senile) of retina, unspecified   . Personal history of other diseases of digestive system     pancreatitis from gallstones  . Cholelithiasis   . Unspecified disorders of arteries and arterioles   . Other specified personal history presenting hazards to health(V15.89)   . Personal history of other diseases of circulatory system   . Unspecified hemorrhoids without mention of complication   . Unspecified hearing loss   . Anemia, unspecified   . Rosacea   . Personal history of traumatic fracture   . Personal history of other diseases of respiratory system   . Diverticulosis of colon (without mention of hemorrhage)   . Laceration of occipital scalp 11/12/2013  . Macular degeneration 06/28/2013  . Closed fracture of right inferior pubic ramus ([place]) 11/12/2013    11/10/13 s/p fall, ED eval  X-ray R hip  1. Possible nondisplaced right inferior pubic ramus fracture.  2. No femur fracture or dislocation   02/06/14 dc prn Motrin and Norco-not used      Patient Active Problem List   Diagnosis Date Noted  . Pruritus 11/06/2014  . Anemia of chronic disease 06/28/2013  . Chronic renal disease 06/28/2013  .  Hypothyroidism 06/28/2013  . Constipation 06/28/2013  . Depression with anxiety 06/28/2013  . Senile dementia 06/28/2013  . Osteoporosis, unspecified 06/28/2013  . Insomnia 06/28/2013  . Urinary frequency 06/28/2013  . Hiatal hernia 06/28/2013  . SVT (supraventricular tachycardia) ([place]) 06/28/2013  . Hx of syncope 06/28/2013  . Hemorrhoids 06/28/2013  . HOH (hard of hearing) 06/28/2013  . Diverticulosis 06/28/2013  . Hx of pancreatitis 06/28/2013  . HTN (hypertension)  10/18/2012  . Hypercholesteremia 10/18/2012  . Carotid atherosclerosis 10/18/2012  . H/O acute pancreatitis 10/18/2012    Allergies  Allergen Reactions  . Ace Inhibitors     cough  . Mirtazapine     Nightmares   . Penicillins Rash    Medications: Patient's Medications  New Prescriptions   No medications on file  Previous Medications   ACETAMINOPHEN (TYLENOL) 325 MG TABLET    Take 650 mg by mouth every 6 (six) hours as needed.   AMLODIPINE (NORVASC) 10 MG TABLET    Take 10 mg by mouth daily.   ASPIRIN 81 MG TABLET    Take 81 mg by mouth daily.   BYSTOLIC 10 MG TABLET    Take one tablet daily for blood pressusre   CETIRIZINE (ZYRTEC) 5 MG TABLET    Take 5 mg by mouth daily.   CLINDAMYCIN (CLEOCIN) 150 MG CAPSULE    Take 150 mg by mouth. 4 caps one hour prior dental   ESCITALOPRAM (LEXAPRO) 10 MG TABLET    Take one tablet daily   LEVOTHYROXINE (SYNTHROID, LEVOTHROID) 25 MCG TABLET    Take 25 mcg by mouth daily.   MEMANTINE (NAMENDA XR) 28 MG CP24 24 HR CAPSULE    Take 28 mg by mouth.   POLYETHYLENE GLYCOL (MIRALAX / GLYCOLAX) PACKET    Take 17 g by mouth every 3 (three) days.    POLYVINYL ALCOHOL (LIQUIFILM TEARS) 1.4 % OPHTHALMIC SOLUTION    1 drop as needed for dry eyes.  Modified Medications   No medications on file  Discontinued Medications   RESTASIS 0.05 % OPHTHALMIC EMULSION    Reported on 03/19/2015    Physical Exam: Filed Vitals:   03/19/15 1524  BP: 142/68  Pulse: 64  Temp: 97.6 F (36.4 C)  TempSrc: Oral  Resp: 20   There is no weight on file to calculate BMI.  Physical Exam  Constitutional: She is oriented to person, place, and time. She appears well-developed and well-nourished. No distress.  HENT:  Head: Normocephalic and atraumatic.  Right Ear: External ear normal.  Left Ear: External ear normal.  Nose: Nose normal.  Mouth/Throat: Oropharynx is clear and moist. No oropharyngeal exudate.  Eyes: Conjunctivae and EOM are normal. Pupils are equal,  round, and reactive to light. Right eye exhibits no discharge. Left eye exhibits no discharge. No scleral icterus.  Neck: Normal range of motion. Neck supple. No JVD present. No tracheal deviation present. No thyromegaly present.  Cardiovascular: Normal rate, regular rhythm and intact distal pulses.  Exam reveals no friction rub.   Murmur heard. 2/6 systolic murmur  Pulmonary/Chest: Effort normal and breath sounds normal. No stridor. No respiratory distress. She has no wheezes. She has no rales.  Abdominal: Soft. Bowel sounds are normal. She exhibits no distension and no mass. There is no tenderness. There is no rebound and no guarding.  Genitourinary: Guaiac negative stool.  Musculoskeletal: Normal range of motion. She exhibits no edema or tenderness.  Lymphadenopathy:    She has no cervical adenopathy.  Neurological: She is alert  and oriented to person, place, and time. She has normal reflexes. No cranial nerve deficit. She exhibits abnormal muscle tone. Coordination normal.  Skin: Skin is warm. No rash noted. She is not diaphoretic. No erythema. No pallor.  Lemon sized Lentigo to left lower cheek  Psychiatric: Her mood appears anxious. Her affect is angry. Her affect is not blunt, not labile and not inappropriate. Her speech is not delayed, not tangential and not slurred. She is not agitated, not aggressive, not hyperactive, not slowed, not withdrawn, not actively hallucinating and not combative. Thought content is paranoid. Thought content is not delusional. Cognition and memory are impaired. She does not express impulsivity or inappropriate judgment. She exhibits a depressed mood. She expresses no homicidal and no suicidal ideation. She expresses no suicidal plans and no homicidal plans. She is communicative. She exhibits abnormal recent memory and abnormal remote memory.  Stabilized in MCU She is attentive.    Labs reviewed: Basic Metabolic Panel:  Recent Labs  03/26/14 12/26/14  NA 142  141  K 4.5 4.4  BUN 32* 38*  CREATININE 1.1 1.2*    Liver Function Tests:  Recent Labs  03/26/14 12/26/14  AST 24 21  ALT 15 12  ALKPHOS 79 79    CBC:  Recent Labs  03/26/14 12/26/14  WBC 4.9 4.1  HGB 10.9* 11.1*  HCT 33* 34*  PLT 210 232    Lab Results  Component Value Date   TSH 2.08 12/26/2014   No results found for: HGBA1C No results found for: CHOL, HDL, LDLCALC, LDLDIRECT, TRIG, CHOLHDL  Significant Diagnostic Results since last visit: none  Patient Care Team: Estill Dooms, MD as PCP - General (Internal Medicine) Minnette Merida X, NP as Nurse Practitioner (Nurse Practitioner) Wilford Corner, MD as Consulting Physician (Gastroenterology) Sanda Klein, MD as Consulting Physician (Cardiology)  Assessment/Plan Problem List Items Addressed This Visit    Senile dementia - Primary (Chronic)    -stable in MCU, walking with walker a lot through out day, continue Namenda 28mg  daily.       Hypothyroidism (Chronic)    Continue Levothyroxine 4mcg daily. TSH 1.98 08/20/14, 12/26/14 TSH 2.081      HTN (hypertension) (Chronic)    controlled. Continue Novasc 99m, Bystolic 10mg  daily for Bp. Off Diovan/HCT. Takes Atorvastatin and ASA for cardiovascular risk reduction      Hiatal hernia    Asymptomatic, off acid reducer.       Depression with anxiety    Mood is stable, continue Lexapro      Constipation    Stable, continue MiraLax q3 days           Family/ staff Communication: continue SNF MCU for care needs.   Labs/tests ordered: none  ManXie Bernece Gall NP [facility] [address], [place] [address] On Call:  [phone] & follow prompts after 5pm & weekends Office Phone:  [phone] Office Fax:  [phone]

## 2015-03-21 NOTE — Assessment & Plan Note (Signed)
controlled. Continue Novasc 71m, Bystolic 10mg  daily for Bp. Off Diovan/HCT. Takes Atorvastatin and ASA for cardiovascular risk reduction

## 2015-03-21 NOTE — Assessment & Plan Note (Signed)
Asymptomatic, off acid reducer    

## 2015-03-21 NOTE — Assessment & Plan Note (Signed)
-  stable in MCU, walking with walker a lot through out day, continue Namenda 28mg  daily.

## 2015-03-21 NOTE — Assessment & Plan Note (Signed)
Stable, continue MiraLax q 3 days.  

## 2015-03-21 NOTE — Assessment & Plan Note (Signed)
Continue Levothyroxine 9mcg daily. TSH 1.98 08/20/14, 12/26/14 TSH 2.081

## 2015-04-14 ENCOUNTER — Encounter: Payer: Self-pay | Admitting: Nurse Practitioner

## 2015-04-14 ENCOUNTER — Non-Acute Institutional Stay (SKILLED_NURSING_FACILITY): Payer: PPO | Admitting: Nurse Practitioner

## 2015-04-14 DIAGNOSIS — K5900 Constipation, unspecified: Secondary | ICD-10-CM | POA: Diagnosis not present

## 2015-04-14 DIAGNOSIS — I10 Essential (primary) hypertension: Secondary | ICD-10-CM | POA: Diagnosis not present

## 2015-04-14 DIAGNOSIS — E039 Hypothyroidism, unspecified: Secondary | ICD-10-CM | POA: Diagnosis not present

## 2015-04-14 DIAGNOSIS — K449 Diaphragmatic hernia without obstruction or gangrene: Secondary | ICD-10-CM | POA: Diagnosis not present

## 2015-04-14 DIAGNOSIS — F0390 Unspecified dementia without behavioral disturbance: Secondary | ICD-10-CM | POA: Diagnosis not present

## 2015-04-14 DIAGNOSIS — F418 Other specified anxiety disorders: Secondary | ICD-10-CM

## 2015-04-14 NOTE — Assessment & Plan Note (Signed)
controlled. Continue Novasc 42m, Bystolic 10mg  daily for Bp. Off Diovan/HCT. Takes Atorvastatin and ASA for cardiovascular risk reduction

## 2015-04-14 NOTE — Assessment & Plan Note (Signed)
Asymptomatic, off acid reducer    

## 2015-04-14 NOTE — Progress Notes (Signed)
Patient ID: LESVIA CHURCH, female   DOB: 16-Dec-1915, 82 y.o.   MRN: [id number]  Location:  [facility] Room Number: 107 Place of Service:  SNF (31) Provider: Lennie Odor Manases [place]ch[place] NP  GREEN, Viviann Spare, MD  Patient Care Team: Estill Dooms, MD as PCP - General (Internal Medicine) Anjuli Gemmill X, NP as Nurse Practitioner (Nurse Practitioner) Wilford Corner, MD as Consulting Physician (Gastroenterology) Sanda Klein, MD as Consulting Physician (Cardiology)  Extended Emergency Contact Information Primary Emergency Contact: [name],Pat Address: [place] [address] [name] of [place] Phone: [phone] Mobile Phone: [phone] Relation: Daughter  Code Status:  DNR Goals of care: Advanced Directive information Advanced Directives 04/14/2015  Does patient have an advance directive? Yes  Type of Advance Directive Out of facility DNR (pink MOST or yellow form)  Does patient want to make changes to advanced directive? No - Patient declined  Copy of advanced directive(s) in chart? Yes     Chief Complaint  Patient presents with  . Medical Management of Chronic Issues    Routine visit    HPI:  Pt is a 82 y.o. female seen today for medical management of chronic diseases.     Past Medical History  Diagnosis Date  . Arthritis   . Depression   . Cataract   . Thyroid disease   . Anxiety   . Osteoporosis   . Hypertension   . Atherosclerotic cerebrovascular disease   . Hyperlipidemia   . Pancreatitis   . Other sleep disturbances   . Backache, unspecified   . Hemorrhage of rectum and anus 03/26/2013  . Diseases of lips 03/26/2013  . Senile osteoporosis     with old T11 fracture  . Chronic kidney disease, unspecified ([place])   . Other malaise and fatigue   . Anorexia   . Insomnia, unspecified   . Other symptoms involving cardiovascular system   . Other specified cardiac dysrhythmias(427.89)   . Diaphragmatic hernia without mention  of obstruction or gangrene   . Macular degeneration (senile) of retina, unspecified   . Personal history of other diseases of digestive system     pancreatitis from gallstones  . Cholelithiasis   . Unspecified disorders of arteries and arterioles   . Other specified personal history presenting hazards to health(V15.89)   . Personal history of other diseases of circulatory system   . Unspecified hemorrhoids without mention of complication   . Unspecified hearing loss   . Anemia, unspecified   . Rosacea   . Personal history of traumatic fracture   . Personal history of other diseases of respiratory system   . Diverticulosis of colon (without mention of hemorrhage)   . Laceration of occipital scalp 11/12/2013  . Macular degeneration 06/28/2013  . Closed fracture of right inferior pubic ramus ([place]) 11/12/2013    11/10/13 s/p fall, ED eval  X-ray R hip  1. Possible nondisplaced right inferior pubic ramus fracture.  2. No femur fracture or dislocation   02/06/14 dc prn Motrin and Norco-not used     Past Surgical History  Procedure Laterality Date  . Abdominal hysterectomy      and BSO fibroids  . Joint replacement  1996  . Colonoscopy  05/23/2007  . Total hip arthroplasty Right     Allergies  Allergen Reactions  . Ace Inhibitors     cough  . Mirtazapine     Nightmares   . Penicillins Rash  Medication List       This list is accurate as of: 04/14/15  5:04 PM.  Always use your most recent med list.               acetaminophen 325 MG tablet  Commonly known as:  TYLENOL  Take 650 mg by mouth every 6 (six) hours as needed.     acetaminophen 500 MG tablet  Commonly known as:  TYLENOL  Take 500 mg by mouth 2 (two) times daily.     amLODipine 10 MG tablet  Commonly known as:  NORVASC  Take 10 mg by mouth daily.     aspirin 81 MG tablet  Take 81 mg by mouth daily.     BYSTOLIC 10 MG tablet  Generic drug:  nebivolol  Take one tablet daily for blood pressusre      cetirizine 5 MG tablet  Commonly known as:  ZYRTEC  Take 5 mg by mouth daily.     clindamycin 150 MG capsule  Commonly known as:  CLEOCIN  Take 150 mg by mouth. 4 caps one hour prior dental     escitalopram 10 MG tablet  Commonly known as:  LEXAPRO  Take one tablet daily     levothyroxine 25 MCG tablet  Commonly known as:  SYNTHROID, LEVOTHROID  Take 25 mcg by mouth daily.     NAMENDA XR 28 MG Cp24 24 hr capsule  Generic drug:  memantine  Take 28 mg by mouth.     polyethylene glycol packet  Commonly known as:  MIRALAX / GLYCOLAX  Take 17 g by mouth every 3 (three) days.     polyvinyl alcohol 1.4 % ophthalmic solution  Commonly known as:  LIQUIFILM TEARS  1 drop as needed for dry eyes.        Review of Systems  Constitutional: Negative for fever and chills.  HENT: Positive for hearing loss. Negative for congestion, ear discharge, ear pain, nosebleeds and sore throat.   Eyes: Negative for pain, discharge and redness.  Respiratory: Negative for cough, shortness of breath and stridor.   Cardiovascular: Negative for chest pain, palpitations and leg swelling.  Gastrointestinal: Negative for nausea, vomiting, abdominal pain, diarrhea and constipation.  Genitourinary: Positive for frequency. Negative for dysuria, urgency and flank pain.  Musculoskeletal: Negative for myalgias, back pain and neck pain.       Ambulates with walker.   Skin: Negative for rash.       Neck and upper back itching resolved  Neurological: Negative for dizziness, tremors, seizures, weakness and headaches.  Psychiatric/Behavioral: Negative for suicidal ideas and hallucinations. The patient is nervous/anxious.     Immunization History  Administered Date(s) Administered  . DTaP 10/12/2012  . Influenza-Unspecified 11/22/2012, 12/26/2013, 11/12/2014  . PPD Test 12/06/2013  . Pneumococcal Polysaccharide-23 09/22/2000  . Td 07/08/2005  . Tdap 11/10/2013   Pertinent  Health Maintenance Due  Topic Date  Due  . DEXA SCAN  24-Jan-201984  . PNA vac Low Risk Adult (2 of 2 - PCV13) 09/22/2001  . INFLUENZA VACCINE  09/23/2015   Fall Risk  01/13/2015 06/28/2013  Falls in the past year? No No  Risk for fall due to : Impaired balance/gait -   Functional Status Survey:    Filed Vitals:   04/14/15 1146  BP: 130/64  Pulse: 64  Temp: 97.9 F (36.6 C)  TempSrc: Oral  Resp: 16  Height: 5' (1.524 m)  Weight: 130 lb 6.4 oz (59.149 kg)   Body mass  index is 25.47 kg/(m^2). Physical Exam  Constitutional: She is oriented to person, place, and time. She appears well-developed and well-nourished. No distress.  HENT:  Head: Normocephalic and atraumatic.  Right Ear: External ear normal.  Left Ear: External ear normal.  Nose: Nose normal.  Mouth/Throat: Oropharynx is clear and moist. No oropharyngeal exudate.  Eyes: Conjunctivae and EOM are normal. Pupils are equal, round, and reactive to light. Right eye exhibits no discharge. Left eye exhibits no discharge. No scleral icterus.  Neck: Normal range of motion. Neck supple. No JVD present. No tracheal deviation present. No thyromegaly present.  Cardiovascular: Normal rate, regular rhythm and intact distal pulses.  Exam reveals no friction rub.   Murmur heard. 2/6 systolic murmur  Pulmonary/Chest: Effort normal and breath sounds normal. No stridor. No respiratory distress. She has no wheezes. She has no rales.  Abdominal: Soft. Bowel sounds are normal. She exhibits no distension and no mass. There is no tenderness. There is no rebound and no guarding.  Genitourinary: Guaiac negative stool.  Musculoskeletal: Normal range of motion. She exhibits no edema or tenderness.  Lymphadenopathy:    She has no cervical adenopathy.  Neurological: She is alert and oriented to person, place, and time. She has normal reflexes. No cranial nerve deficit. She exhibits abnormal muscle tone. Coordination normal.  Skin: Skin is warm. No rash noted. She is not diaphoretic. No  erythema. No pallor.  Lemon sized Lentigo to left lower cheek  Psychiatric: Her mood appears anxious. Her affect is angry. Her affect is not blunt, not labile and not inappropriate. Her speech is not delayed, not tangential and not slurred. She is not agitated, not aggressive, not hyperactive, not slowed, not withdrawn, not actively hallucinating and not combative. Thought content is paranoid. Thought content is not delusional. Cognition and memory are impaired. She does not express impulsivity or inappropriate judgment. She exhibits a depressed mood. She expresses no homicidal and no suicidal ideation. She expresses no suicidal plans and no homicidal plans. She is communicative. She exhibits abnormal recent memory and abnormal remote memory.  Stabilized in MCU She is attentive.    Labs reviewed:  Recent Labs  12/26/14  NA 141  K 4.4  BUN 38*  CREATININE 1.2*    Recent Labs  12/26/14  AST 21  ALT 12  ALKPHOS 79    Recent Labs  12/26/14  WBC 4.1  HGB 11.1*  HCT 34*  PLT 232   Lab Results  Component Value Date   TSH 2.08 12/26/2014   No results found for: HGBA1C No results found for: CHOL, HDL, LDLCALC, LDLDIRECT, TRIG, CHOLHDL  Significant Diagnostic Results in last 30 days:  No results found.  Assessment/Plan Problem List Items Addressed This Visit    HTN (hypertension) - Primary (Chronic)    controlled. Continue Novasc 87m, Bystolic 10mg  daily for Bp. Off Diovan/HCT. Takes Atorvastatin and ASA for cardiovascular risk reduction      Hypothyroidism (Chronic)    Continue Levothyroxine 21mcg daily. TSH 1.98 08/20/14, 12/26/14 TSH 2.081      Senile dementia (Chronic)    -stable in MCU, walking with walker a lot through out day, continue Namenda 28mg  daily.       Constipation    Stable, continue MiraLax q3 days       Depression with anxiety    Mood is stable, continue Lexapro      Hiatal hernia    Asymptomatic, off acid reducer.  Family/ staff  Communication: continue SNF for care needs  Labs/tests ordered: none

## 2015-04-14 NOTE — Assessment & Plan Note (Signed)
Stable, continue MiraLax q 3 days.  

## 2015-04-14 NOTE — Assessment & Plan Note (Signed)
-  stable in MCU, walking with walker a lot through out day, continue Namenda 28mg  daily.

## 2015-04-14 NOTE — Assessment & Plan Note (Signed)
Continue Levothyroxine 33mcg daily. TSH 1.98 08/20/14, 12/26/14 TSH 2.081

## 2015-04-14 NOTE — Assessment & Plan Note (Signed)
Mood is stable, continue Lexapro  

## 2015-05-19 ENCOUNTER — Encounter: Payer: Self-pay | Admitting: Nurse Practitioner

## 2015-05-19 ENCOUNTER — Non-Acute Institutional Stay (SKILLED_NURSING_FACILITY): Payer: PPO | Admitting: Nurse Practitioner

## 2015-05-19 DIAGNOSIS — D638 Anemia in other chronic diseases classified elsewhere: Secondary | ICD-10-CM | POA: Diagnosis not present

## 2015-05-19 DIAGNOSIS — E039 Hypothyroidism, unspecified: Secondary | ICD-10-CM

## 2015-05-19 DIAGNOSIS — F0390 Unspecified dementia without behavioral disturbance: Secondary | ICD-10-CM | POA: Diagnosis not present

## 2015-05-19 DIAGNOSIS — K5900 Constipation, unspecified: Secondary | ICD-10-CM

## 2015-05-19 DIAGNOSIS — N182 Chronic kidney disease, stage 2 (mild): Secondary | ICD-10-CM

## 2015-05-19 DIAGNOSIS — I10 Essential (primary) hypertension: Secondary | ICD-10-CM

## 2015-05-19 DIAGNOSIS — F418 Other specified anxiety disorders: Secondary | ICD-10-CM

## 2015-05-19 DIAGNOSIS — L299 Pruritus, unspecified: Secondary | ICD-10-CM | POA: Diagnosis not present

## 2015-05-19 DIAGNOSIS — K449 Diaphragmatic hernia without obstruction or gangrene: Secondary | ICD-10-CM | POA: Diagnosis not present

## 2015-05-19 NOTE — Assessment & Plan Note (Signed)
03/26/14 bun/creat 32/1.11 12/26/14 Bun/creat 38/1.1

## 2015-05-19 NOTE — Assessment & Plan Note (Signed)
Stable, continue MiraLax and diet

## 2015-05-19 NOTE — Progress Notes (Signed)
Patient ID: Patricia Nelson, female   DOB: [birth year], 82 y.o.   MRN: [id number]  Location:  [facility] Room Number: 107 Place of Service:  SNF (31) Provider: Lennie Odor Cady Hafen NP  GREEN, Viviann Spare, MD  Patient Care Team: Estill Dooms, MD as PCP - General (Internal Medicine) Lamyia Cdebaca X, NP as Nurse Practitioner (Nurse Practitioner) Wilford Corner, MD as Consulting Physician (Gastroenterology) Sanda Klein, MD as Consulting Physician (Cardiology)  Extended Emergency Contact Information Primary Emergency Contact: [name],Pat Address: [place] [address] [name] of [place] Phone: [phone] Mobile Phone: [phone] Relation: Daughter  Code Status:  DNR Goals of care: Advanced Directive information Advanced Directives 05/19/2015  Does patient have an advance directive? Yes  Type of Advance Directive Out of facility DNR (pink MOST or yellow form)  Does patient want to make changes to advanced directive? No - Patient declined  Copy of advanced directive(s) in chart? Yes     Chief Complaint  Patient presents with  . Medical Management of Chronic Issues    [place] Visit    HPI:  Pt is a 82 y.o. female seen today for medical management of chronic diseases.  Hx of hypothyroidism, taking Levothyroxine 31mcg, last TSH 2.08 12/25/15,  HTN, controlled on Amlodipine 10mg  and Bystolic 10mg ,  Dementia, resides in [facility], on Namenda for memory,  Depression, mood is stable, taking Lexapro 10mg  daiily.     Past Medical History  Diagnosis Date  . Arthritis   . Depression   . Cataract   . Thyroid disease   . Anxiety   . Osteoporosis   . Hypertension   . Atherosclerotic cerebrovascular disease   . Hyperlipidemia   . Pancreatitis   . Other sleep disturbances   . Backache, unspecified   . Hemorrhage of rectum and anus 03/26/2013  . Diseases of lips 03/26/2013  . Senile osteoporosis     with old T11 fracture  . Chronic  kidney disease, unspecified ([place])   . Other malaise and fatigue   . Anorexia   . Insomnia, unspecified   . Other symptoms involving cardiovascular system   . Other specified cardiac dysrhythmias(427.89)   . Diaphragmatic hernia without mention of obstruction or gangrene   . Macular degeneration (senile) of retina, unspecified   . Personal history of other diseases of digestive system     pancreatitis from gallstones  . Cholelithiasis   . Unspecified disorders of arteries and arterioles   . Other specified personal history presenting hazards to health(V15.89)   . Personal history of other diseases of circulatory system   . Unspecified hemorrhoids without mention of complication   . Unspecified hearing loss   . Anemia, unspecified   . Rosacea   . Personal history of traumatic fracture   . Personal history of other diseases of respiratory system   . Diverticulosis of colon (without mention of hemorrhage)   . Laceration of occipital scalp 11/12/2013  . Macular degeneration 06/28/2013  . Closed fracture of right inferior pubic ramus ([place]) 11/12/2013    11/10/13 s/p fall, ED eval  X-ray R hip  1. Possible nondisplaced right inferior pubic ramus fracture.  2. No femur fracture or dislocation   02/06/14 dc prn Motrin and Norco-not used     Past Surgical History  Procedure Laterality Date  . Abdominal hysterectomy      and BSO fibroids  . Joint replacement  1996  .  Colonoscopy  05/23/2007  . Total hip arthroplasty Right     Allergies  Allergen Reactions  . Ace Inhibitors     cough  . Mirtazapine     Nightmares   . Penicillins Rash      Medication List       This list is accurate as of: 05/19/15 11:06 AM.  Always use your most recent med list.               acetaminophen 325 MG tablet  Commonly known as:  TYLENOL  Take 650 mg by mouth every 6 (six) hours as needed.     acetaminophen 500 MG tablet  Commonly known as:  TYLENOL  Take 500 mg by mouth 2 (two) times daily.      amLODipine 10 MG tablet  Commonly known as:  NORVASC  Take 10 mg by mouth daily.     aspirin 81 MG tablet  Take 81 mg by mouth daily.     BYSTOLIC 10 MG tablet  Generic drug:  nebivolol  Take one tablet daily for blood pressusre     cetirizine 5 MG tablet  Commonly known as:  ZYRTEC  Take 5 mg by mouth daily.     clindamycin 150 MG capsule  Commonly known as:  CLEOCIN  Take 150 mg by mouth. 4 caps one hour prior dental     escitalopram 10 MG tablet  Commonly known as:  LEXAPRO  Take one tablet daily     levothyroxine 25 MCG tablet  Commonly known as:  SYNTHROID, LEVOTHROID  Take 25 mcg by mouth daily.     NAMENDA XR 28 MG Cp24 24 hr capsule  Generic drug:  memantine  Take 28 mg by mouth.     polyethylene glycol packet  Commonly known as:  MIRALAX / GLYCOLAX  Take 17 g by mouth every 3 (three) days.     polyvinyl alcohol 1.4 % ophthalmic solution  Commonly known as:  LIQUIFILM TEARS  1 drop as needed for dry eyes.        Review of Systems  Constitutional: Negative for fever and chills.  HENT: Positive for hearing loss. Negative for congestion, ear discharge, ear pain, nosebleeds and sore throat.   Eyes: Negative for pain, discharge and redness.  Respiratory: Negative for cough, shortness of breath and stridor.   Cardiovascular: Negative for chest pain, palpitations and leg swelling.  Gastrointestinal: Negative for nausea, vomiting, abdominal pain, diarrhea and constipation.  Genitourinary: Positive for frequency. Negative for dysuria, urgency and flank pain.  Musculoskeletal: Negative for myalgias, back pain and neck pain.       Ambulates with walker.   Skin: Negative for rash.       Neck and upper back itching resolved  Neurological: Negative for dizziness, tremors, seizures, weakness and headaches.  Psychiatric/Behavioral: Negative for suicidal ideas and hallucinations. The patient is nervous/anxious.     Immunization History  Administered Date(s)  Administered  . DTaP 10/12/2012  . Influenza-Unspecified 11/22/2012, 12/26/2013, 11/12/2014  . PPD Test 12/06/2013  . Pneumococcal Polysaccharide-23 09/22/2000  . Td 07/08/2005  . Tdap 11/10/2013   Pertinent  Health Maintenance Due  Topic Date Due  . DEXA SCAN  2019-09-482  . PNA vac Low Risk Adult (2 of 2 - PCV13) 09/22/2001  . INFLUENZA VACCINE  09/23/2015   Fall Risk  01/13/2015 06/28/2013  Falls in the past year? No No  Risk for fall due to : Impaired balance/gait -   Functional Status Survey:  Filed Vitals:   05/19/15 1013  BP: 118/68  Pulse: 50  Temp: 96.7 F (35.9 C)  TempSrc: Oral  Resp: 18  Height: 5' (1.524 m)  Weight: 130 lb 6.4 oz (59.149 kg)   Body mass index is 25.47 kg/(m^2). Physical Exam  Constitutional: She is oriented to person, place, and time. She appears well-developed and well-nourished. No distress.  HENT:  Head: Normocephalic and atraumatic.  Right Ear: External ear normal.  Left Ear: External ear normal.  Nose: Nose normal.  Mouth/Throat: Oropharynx is clear and moist. No oropharyngeal exudate.  Eyes: Conjunctivae and EOM are normal. Pupils are equal, round, and reactive to light. Right eye exhibits no discharge. Left eye exhibits no discharge. No scleral icterus.  Neck: Normal range of motion. Neck supple. No JVD present. No tracheal deviation present. No thyromegaly present.  Cardiovascular: Normal rate, regular rhythm and intact distal pulses.  Exam reveals no friction rub.   Murmur heard. 2/6 systolic murmur  Pulmonary/Chest: Effort normal and breath sounds normal. No stridor. No respiratory distress. She has no wheezes. She has no rales.  Abdominal: Soft. Bowel sounds are normal. She exhibits no distension and no mass. There is no tenderness. There is no rebound and no guarding.  Genitourinary: Guaiac negative stool.  Musculoskeletal: Normal range of motion. She exhibits no edema or tenderness.  Lymphadenopathy:    She has no cervical  adenopathy.  Neurological: She is alert and oriented to person, place, and time. She has normal reflexes. No cranial nerve deficit. She exhibits abnormal muscle tone. Coordination normal.  Skin: Skin is warm. No rash noted. She is not diaphoretic. No erythema. No pallor.  Lemon sized Lentigo to left lower cheek  Psychiatric: Her mood appears anxious. Her affect is angry. Her affect is not blunt, not labile and not inappropriate. Her speech is not delayed, not tangential and not slurred. She is not agitated, not aggressive, not hyperactive, not slowed, not withdrawn, not actively hallucinating and not combative. Thought content is paranoid. Thought content is not delusional. Cognition and memory are impaired. She does not express impulsivity or inappropriate judgment. She exhibits a depressed mood. She expresses no homicidal and no suicidal ideation. She expresses no suicidal plans and no homicidal plans. She is communicative. She exhibits abnormal recent memory and abnormal remote memory.  Stabilized in MCU She is attentive.    Labs reviewed:  Recent Labs  12/26/14  NA 141  K 4.4  BUN 38*  CREATININE 1.2*    Recent Labs  12/26/14  AST 21  ALT 12  ALKPHOS 79    Recent Labs  12/26/14  WBC 4.1  HGB 11.1*  HCT 34*  PLT 232   Lab Results  Component Value Date   TSH 2.08 12/26/2014   No results found for: HGBA1C No results found for: CHOL, HDL, LDLCALC, LDLDIRECT, TRIG, CHOLHDL  Significant Diagnostic Results in last 30 days:  No results found.  Assessment/Plan Problem List Items Addressed This Visit    Anemia of chronic disease - Primary (Chronic)    07/03/13 Hgb 8.9 07/03/13. Normal Iron 52, B12 641, Folate 15.5. 12/26/14 Hgb 11.1      Chronic renal disease (Chronic)    03/26/14 bun/creat 32/1.11 12/26/14 Bun/creat 38/1.1      Constipation    Stable, continue MiraLax and diet      Depression with anxiety    Mood is stable, continue Lexapro 10mg  daily      Hiatal  hernia    Asymptomatic, off acid  reducer.      HTN (hypertension) (Chronic)    controlled. Continue Amlodipine 10mg , Bystolic 10mg  daily for Bp. Off Diovan/HCT. Takes Atorvastatin and ASA for cardiovascular risk reduction      Hypothyroidism (Chronic)    Continue Levothyroxine 62mcg daily. TSH 1.98 08/20/14, 12/26/14 TSH 2.081      Pruritus    Stable, continue Zyrtec in setting of allergy       Senile dementia (Chronic)    stable in MCU, walking with walker a lot through out day, continue Namenda 28mg  daily.           Family/ staff Communication: continue SNF for care needs  Labs/tests ordered: none

## 2015-05-19 NOTE — Assessment & Plan Note (Signed)
Asymptomatic, off acid reducer    

## 2015-05-19 NOTE — Assessment & Plan Note (Signed)
Stable, continue Zyrtec in setting of allergy

## 2015-05-19 NOTE — Assessment & Plan Note (Signed)
Continue Levothyroxine 27mcg daily. TSH 1.98 08/20/14, 12/26/14 TSH 2.081

## 2015-05-19 NOTE — Assessment & Plan Note (Signed)
controlled. Continue Amlodipine 10mg , Bystolic 10mg  daily for Bp. Off Diovan/HCT. Takes Atorvastatin and ASA for cardiovascular risk reduction

## 2015-05-19 NOTE — Assessment & Plan Note (Signed)
stable in MCU, walking with walker a lot through out day, continue Namenda 28mg  daily.

## 2015-05-19 NOTE — Assessment & Plan Note (Signed)
Mood is stable, continue Lexapro 10mg  daily

## 2015-05-19 NOTE — Assessment & Plan Note (Signed)
07/03/13 Hgb 8.9 07/03/13. Normal Iron 52, B12 641, Folate 15.5. 12/26/14 Hgb 11.1

## 2015-06-23 ENCOUNTER — Encounter: Payer: Self-pay | Admitting: Nurse Practitioner

## 2015-06-23 ENCOUNTER — Non-Acute Institutional Stay (SKILLED_NURSING_FACILITY): Payer: PPO | Admitting: Nurse Practitioner

## 2015-06-23 DIAGNOSIS — D638 Anemia in other chronic diseases classified elsewhere: Secondary | ICD-10-CM

## 2015-06-23 DIAGNOSIS — F0391 Unspecified dementia with behavioral disturbance: Secondary | ICD-10-CM | POA: Diagnosis not present

## 2015-06-23 DIAGNOSIS — F03918 Unspecified dementia, unspecified severity, with other behavioral disturbance: Secondary | ICD-10-CM

## 2015-06-23 DIAGNOSIS — E039 Hypothyroidism, unspecified: Secondary | ICD-10-CM | POA: Diagnosis not present

## 2015-06-23 DIAGNOSIS — F418 Other specified anxiety disorders: Secondary | ICD-10-CM

## 2015-06-23 DIAGNOSIS — L299 Pruritus, unspecified: Secondary | ICD-10-CM

## 2015-06-23 DIAGNOSIS — N182 Chronic kidney disease, stage 2 (mild): Secondary | ICD-10-CM

## 2015-06-23 DIAGNOSIS — K449 Diaphragmatic hernia without obstruction or gangrene: Secondary | ICD-10-CM

## 2015-06-23 DIAGNOSIS — K5900 Constipation, unspecified: Secondary | ICD-10-CM | POA: Diagnosis not present

## 2015-06-23 DIAGNOSIS — I10 Essential (primary) hypertension: Secondary | ICD-10-CM | POA: Diagnosis not present

## 2015-06-23 NOTE — Assessment & Plan Note (Signed)
07/03/13 Hgb 8.9 07/03/13. Normal Iron 52, B12 641, Folate 15.5. 12/26/14 Hgb 11.1

## 2015-06-23 NOTE — Progress Notes (Signed)
Patient ID: Patricia Nelson, female   DOB: 11-Sep-1915, 82 y.o.   MRN: [id number]  Location:  [facility] Room Number: 107 Place of Service:  SNF (31) Provider: Lennie Odor Cullin Dishman NP  GREEN, Viviann Spare, MD  Patient Care Team: Estill Dooms, MD as PCP - General (Internal Medicine) Zaylyn Bergdoll X, NP as Nurse Practitioner (Nurse Practitioner) Wilford Corner, MD as Consulting Physician (Gastroenterology) Sanda Klein, MD as Consulting Physician (Cardiology)  Extended Emergency Contact Information Primary Emergency Contact: [name],Pat Address: [place] [address] [name] [place] Phone: [phone] Mobile Phone: [phone] Relation: Daughter  Code Status:  DNR Goals of care: Advanced Directive information Advanced Directives 06/23/2015  Does patient have an advance directive? Yes  Type of Advance Directive Out of facility DNR (pink MOST or yellow form);Healthcare Power of Attorney  Does patient want to make changes to advanced directive? No - Patient declined  Copy of advanced directive(s) in chart? Yes     Chief Complaint  Patient presents with  . Medical Management of Chronic Issues    Routine Visit    HPI:  Pt is a 82 y.o. female seen today for medical management of chronic diseases.  Hx of hypothyroidism, taking Levothyroxine 12mcg, last TSH 2.08 12/26/14,  HTN, controlled on Amlodipine 10mg  and Bystolic 10mg ,  Dementia, resides in [facility], on Namenda for memory,  Depression, mood is stable, taking Lexapro 10mg  daiily.   Past Medical History  Diagnosis Date  . Arthritis   . Depression   . Cataract   . Thyroid disease   . Anxiety   . Osteoporosis   . Hypertension   . Atherosclerotic cerebrovascular disease   . Hyperlipidemia   . Pancreatitis   . Other sleep disturbances   . Backache, unspecified   . Hemorrhage of rectum and anus 03/26/2013  . Diseases of lips 03/26/2013  . Senile osteoporosis     with old  T11 fracture  . Chronic kidney disease, unspecified ([place])   . Other malaise and fatigue   . Anorexia   . Insomnia, unspecified   . Other symptoms involving cardiovascular system   . Other specified cardiac dysrhythmias(427.89)   . Diaphragmatic hernia without mention of obstruction or gangrene   . Macular degeneration (senile) of retina, unspecified   . Personal history of other diseases of digestive system     pancreatitis from gallstones  . Cholelithiasis   . Unspecified disorders of arteries and arterioles   . Other specified personal history presenting hazards to health(V15.89)   . Personal history of other diseases of circulatory system   . Unspecified hemorrhoids without mention of complication   . Unspecified hearing loss   . Anemia, unspecified   . Rosacea   . Personal history of traumatic fracture   . Personal history of other diseases of respiratory system   . Diverticulosis of colon (without mention of hemorrhage)   . Laceration of occipital scalp 11/12/2013  . Macular degeneration 06/28/2013  . Closed fracture of right inferior pubic ramus ([place]) 11/12/2013    11/10/13 s/p fall, ED eval  X-ray R hip  1. Possible nondisplaced right inferior pubic ramus fracture.  2. No femur fracture or dislocation   02/06/14 dc prn Motrin and Norco-not used     Past Surgical History  Procedure Laterality Date  . Abdominal hysterectomy      and BSO fibroids  . Joint replacement  1996  .  Colonoscopy  05/23/2007  . Total hip arthroplasty Right     Allergies  Allergen Reactions  . Ace Inhibitors     cough  . Mirtazapine     Nightmares   . Penicillins Rash      Medication List       This list is accurate as of: 06/23/15  2:04 PM.  Always use your most recent med list.               acetaminophen 325 MG tablet  Commonly known as:  TYLENOL  Take 650 mg by mouth every 6 (six) hours as needed.     acetaminophen 500 MG tablet  Commonly known as:  TYLENOL  Take 500 mg by mouth 2  (two) times daily.     amLODipine 10 MG tablet  Commonly known as:  NORVASC  Take 10 mg by mouth daily.     aspirin 81 MG tablet  Take 81 mg by mouth daily.     BYSTOLIC 10 MG tablet  Generic drug:  nebivolol  Take one tablet daily for blood pressusre     cetirizine 5 MG tablet  Commonly known as:  ZYRTEC  Take 5 mg by mouth daily.     clindamycin 150 MG capsule  Commonly known as:  CLEOCIN  Take 150 mg by mouth. 4 caps one hour prior dental     escitalopram 10 MG tablet  Commonly known as:  LEXAPRO  Take one tablet daily     levothyroxine 25 MCG tablet  Commonly known as:  SYNTHROID, LEVOTHROID  Take 25 mcg by mouth daily.     NAMENDA XR 28 MG Cp24 24 hr capsule  Generic drug:  memantine  Take 28 mg by mouth.     polyethylene glycol packet  Commonly known as:  MIRALAX / GLYCOLAX  Take 17 g by mouth every 3 (three) days.     polyvinyl alcohol 1.4 % ophthalmic solution  Commonly known as:  LIQUIFILM TEARS  1 drop as needed for dry eyes.        Review of Systems  Constitutional: Negative for fever and chills.  HENT: Positive for hearing loss. Negative for congestion, ear discharge, ear pain, nosebleeds and sore throat.   Eyes: Negative for pain, discharge and redness.  Respiratory: Negative for cough, shortness of breath and stridor.   Cardiovascular: Negative for chest pain, palpitations and leg swelling.  Gastrointestinal: Negative for nausea, vomiting, abdominal pain, diarrhea and constipation.  Genitourinary: Positive for frequency. Negative for dysuria, urgency and flank pain.  Musculoskeletal: Negative for myalgias, back pain and neck pain.       Ambulates with walker.   Skin: Negative for rash.       Neck and upper back itching resolved  Neurological: Negative for dizziness, tremors, seizures, weakness and headaches.  Psychiatric/Behavioral: Negative for suicidal ideas and hallucinations. The patient is nervous/anxious.     Immunization History    Administered Date(s) Administered  . DTaP 10/12/2012  . Influenza-Unspecified 11/22/2012, 12/26/2013, 11/12/2014  . PPD Test 12/06/2013  . Pneumococcal Polysaccharide-23 09/22/2000  . Td 07/08/2005  . Tdap 11/10/2013   Pertinent  Health Maintenance Due  Topic Date Due  . DEXA SCAN  06/06/201984  . PNA vac Low Risk Adult (2 of 2 - PCV13) 09/22/2001  . INFLUENZA VACCINE  09/23/2015   Fall Risk  01/13/2015 06/28/2013  Falls in the past year? No No  Risk for fall due to : Impaired balance/gait -   Functional Status  Survey:    Filed Vitals:   06/23/15 1113  BP: 141/58  Pulse: 56  Temp: 97.4 F (36.3 C)  TempSrc: Oral  Resp: 16  Height: 5' (1.524 m)  Weight: 129 lb 12.8 oz (58.877 kg)   Body mass index is 25.35 kg/(m^2). Physical Exam  Constitutional: She is oriented to person, place, and time. She appears well-developed and well-nourished. No distress.  HENT:  Head: Normocephalic and atraumatic.  Right Ear: External ear normal.  Left Ear: External ear normal.  Nose: Nose normal.  Mouth/Throat: Oropharynx is clear and moist. No oropharyngeal exudate.  Eyes: Conjunctivae and EOM are normal. Pupils are equal, round, and reactive to light. Right eye exhibits no discharge. Left eye exhibits no discharge. No scleral icterus.  Neck: Normal range of motion. Neck supple. No JVD present. No tracheal deviation present. No thyromegaly present.  Cardiovascular: Normal rate, regular rhythm and intact distal pulses.  Exam reveals no friction rub.   Murmur heard. 2/6 systolic murmur  Pulmonary/Chest: Effort normal and breath sounds normal. No stridor. No respiratory distress. She has no wheezes. She has no rales.  Abdominal: Soft. Bowel sounds are normal. She exhibits no distension and no mass. There is no tenderness. There is no rebound and no guarding.  Genitourinary: Guaiac negative stool.  Musculoskeletal: Normal range of motion. She exhibits no edema or tenderness.  Lymphadenopathy:     She has no cervical adenopathy.  Neurological: She is alert and oriented to person, place, and time. She has normal reflexes. No cranial nerve deficit. She exhibits abnormal muscle tone. Coordination normal.  Skin: Skin is warm. No rash noted. She is not diaphoretic. No erythema. No pallor.  Lemon sized Lentigo to left lower cheek  Psychiatric: Her mood appears anxious. Her affect is angry. Her affect is not blunt, not labile and not inappropriate. Her speech is not delayed, not tangential and not slurred. She is not agitated, not aggressive, not hyperactive, not slowed, not withdrawn, not actively hallucinating and not combative. Thought content is paranoid. Thought content is not delusional. Cognition and memory are impaired. She does not express impulsivity or inappropriate judgment. She exhibits a depressed mood. She expresses no homicidal and no suicidal ideation. She expresses no suicidal plans and no homicidal plans. She is communicative. She exhibits abnormal recent memory and abnormal remote memory.  Stabilized in MCU She is attentive.    Labs reviewed:  Recent Labs  12/26/14  NA 141  K 4.4  BUN 38*  CREATININE 1.2*    Recent Labs  12/26/14  AST 21  ALT 12  ALKPHOS 79    Recent Labs  12/26/14  WBC 4.1  HGB 11.1*  HCT 34*  PLT 232   Lab Results  Component Value Date   TSH 2.08 12/26/2014   No results found for: HGBA1C No results found for: CHOL, HDL, LDLCALC, LDLDIRECT, TRIG, CHOLHDL  Significant Diagnostic Results in last 30 days:  No results found.  Assessment/Plan Problem List Items Addressed This Visit    Anemia of chronic disease - Primary (Chronic)    07/03/13 Hgb 8.9 07/03/13. Normal Iron 52, B12 641, Folate 15.5. 12/26/14 Hgb 11.1       Chronic renal disease (Chronic)    03/26/14 bun/creat 32/1.11 12/26/14 Bun/creat 38/1.1       Constipation    Stable, continue MiraLax and diet       Depression with anxiety    Mood is stable, continue Lexapro  10mg  daily  Hiatal hernia    Asymptomatic, off acid reducer.        HTN (hypertension) (Chronic)    controlled. Continue Amlodipine 10mg , Bystolic 10mg  daily for Bp. Off Diovan/HCT. Takes Atorvastatin and ASA for cardiovascular risk reduction      Hypothyroidism (Chronic)    Continue Levothyroxine 17mcg daily. TSH 1.98 08/20/14, 12/26/14 TSH 2.081       Pruritus    Stable, continue Zyrtec in setting of allergy        Senile dementia (Chronic)    stable in MCU, walking with walker a lot through out day, continue Namenda 28mg  daily.            Family/ staff Communication: continue SNF for care needs  Labs/tests ordered: none

## 2015-06-23 NOTE — Assessment & Plan Note (Signed)
controlled. Continue Amlodipine 10mg , Bystolic 10mg  daily for Bp. Off Diovan/HCT. Takes Atorvastatin and ASA for cardiovascular risk reduction

## 2015-06-23 NOTE — Assessment & Plan Note (Signed)
stable in MCU, walking with walker a lot through out day, continue Namenda 28mg  daily.

## 2015-06-23 NOTE — Assessment & Plan Note (Signed)
Asymptomatic, off acid reducer    

## 2015-06-23 NOTE — Assessment & Plan Note (Signed)
Stable, continue MiraLax and diet

## 2015-06-23 NOTE — Assessment & Plan Note (Signed)
Continue Levothyroxine 60mcg daily. TSH 1.98 08/20/14, 12/26/14 TSH 2.081

## 2015-06-23 NOTE — Assessment & Plan Note (Signed)
Stable, continue Zyrtec in setting of allergy

## 2015-06-23 NOTE — Assessment & Plan Note (Signed)
03/26/14 bun/creat 32/1.11 12/26/14 Bun/creat 38/1.1

## 2015-06-23 NOTE — Assessment & Plan Note (Signed)
Mood is stable, continue Lexapro 10mg  daily

## 2015-07-17 DIAGNOSIS — D485 Neoplasm of uncertain behavior of skin: Secondary | ICD-10-CM | POA: Diagnosis not present

## 2015-07-17 DIAGNOSIS — Z85828 Personal history of other malignant neoplasm of skin: Secondary | ICD-10-CM | POA: Diagnosis not present

## 2015-07-17 DIAGNOSIS — C44629 Squamous cell carcinoma of skin of left upper limb, including shoulder: Secondary | ICD-10-CM | POA: Diagnosis not present

## 2015-07-18 ENCOUNTER — Non-Acute Institutional Stay (SKILLED_NURSING_FACILITY): Payer: PPO | Admitting: Nurse Practitioner

## 2015-07-18 ENCOUNTER — Encounter: Payer: Self-pay | Admitting: Nurse Practitioner

## 2015-07-18 DIAGNOSIS — I10 Essential (primary) hypertension: Secondary | ICD-10-CM | POA: Diagnosis not present

## 2015-07-18 DIAGNOSIS — F418 Other specified anxiety disorders: Secondary | ICD-10-CM

## 2015-07-18 DIAGNOSIS — K5900 Constipation, unspecified: Secondary | ICD-10-CM | POA: Diagnosis not present

## 2015-07-18 DIAGNOSIS — F0390 Unspecified dementia without behavioral disturbance: Secondary | ICD-10-CM | POA: Diagnosis not present

## 2015-07-18 DIAGNOSIS — K449 Diaphragmatic hernia without obstruction or gangrene: Secondary | ICD-10-CM

## 2015-07-18 DIAGNOSIS — N182 Chronic kidney disease, stage 2 (mild): Secondary | ICD-10-CM

## 2015-07-18 DIAGNOSIS — E039 Hypothyroidism, unspecified: Secondary | ICD-10-CM

## 2015-07-18 DIAGNOSIS — D638 Anemia in other chronic diseases classified elsewhere: Secondary | ICD-10-CM

## 2015-07-18 NOTE — Assessment & Plan Note (Addendum)
stable in MCU, walking with walker a lot through out day, continue Namenda, update CBC and CMP

## 2015-07-18 NOTE — Assessment & Plan Note (Addendum)
07/03/13 Hgb 8.9 07/03/13. Normal Iron 52, B12 641, Folate 15.5. 12/26/14 Hgb 11.1. Update CBC 07/22/15 13.1

## 2015-07-18 NOTE — Assessment & Plan Note (Signed)
Asymptomatic, off acid reducer    

## 2015-07-18 NOTE — Progress Notes (Signed)
Patient ID: Patricia Nelson, female   DOB: Jun 03, 1915, 82 y.o.   MRN: [id number]  Location:  [facility] Room Number: 107 Place of Service:  SNF (31) Provider: Lennie Odor Nyiah Pianka NP  GREEN, Viviann Spare, MD  Patient Care Team: Estill Dooms, MD as PCP - General (Internal Medicine) Shahara Hartsfield X, NP as Nurse Practitioner (Nurse Practitioner) Wilford Corner, MD as Consulting Physician (Gastroenterology) Sanda Klein, MD as Consulting Physician (Cardiology)  Extended Emergency Contact Information Primary Emergency Contact: [name],Pat Address: [place] [address] [name] of [place] Phone: [phone] Mobile Phone: [phone] Relation: Daughter  Code Status:  DNR Goals of care: Advanced Directive information Advanced Directives 07/18/2015  Does patient have an advance directive? Yes  Type of Advance Directive Out of facility DNR (pink MOST or yellow form)  Does patient want to make changes to advanced directive? No - Patient declined  Copy of advanced directive(s) in chart? Yes     Chief Complaint  Patient presents with  . Acute Visit    [place] on 5/23 with no injuries or complaints at that time.    HPI:  Pt is a 82 y.o. female seen today for medical management of chronic diseases.  Hx of hypothyroidism, taking Levothyroxine 39mcg, last TSH 2.08 12/26/14,  HTN, controlled on Amlodipine 10mg  and Bystolic 10mg ,  Dementia, resides in [facility], on Namenda for memory,  Depression, mood is stable, taking Lexapro 10mg  daiily.   Past Medical History  Diagnosis Date  . Arthritis   . Depression   . Cataract   . Thyroid disease   . Anxiety   . Osteoporosis   . Hypertension   . Atherosclerotic cerebrovascular disease   . Hyperlipidemia   . Pancreatitis   . Other sleep disturbances   . Backache, unspecified   . Hemorrhage of rectum and anus 03/26/2013  . Diseases of lips 03/26/2013  . Senile osteoporosis     with old T11  fracture  . Chronic kidney disease, unspecified ([place])   . Other malaise and fatigue   . Anorexia   . Insomnia, unspecified   . Other symptoms involving cardiovascular system   . Other specified cardiac dysrhythmias(427.89)   . Diaphragmatic hernia without mention of obstruction or gangrene   . Macular degeneration (senile) of retina, unspecified   . Personal history of other diseases of digestive system     pancreatitis from gallstones  . Cholelithiasis   . Unspecified disorders of arteries and arterioles   . Other specified personal history presenting hazards to health(V15.89)   . Personal history of other diseases of circulatory system   . Unspecified hemorrhoids without mention of complication   . Unspecified hearing loss   . Anemia, unspecified   . Rosacea   . Personal history of traumatic fracture   . Personal history of other diseases of respiratory system   . Diverticulosis of colon (without mention of hemorrhage)   . Laceration of occipital scalp 11/12/2013  . Macular degeneration 06/28/2013  . Closed fracture of right inferior pubic ramus ([place]) 11/12/2013    11/10/13 s/p fall, ED eval  X-ray R hip  1. Possible nondisplaced right inferior pubic ramus fracture.  2. No femur fracture or dislocation   02/06/14 dc prn Motrin and Norco-not used     Past Surgical History  Procedure Laterality Date  . Abdominal hysterectomy      and BSO fibroids  . Joint replacement  1996  . Colonoscopy  05/23/2007  . Total hip arthroplasty Right     Allergies  Allergen Reactions  . Ace Inhibitors     cough  . Mirtazapine     Nightmares   . Penicillins Rash      Medication List       This list is accurate as of: 07/18/15 11:59 PM.  Always use your most recent med list.               acetaminophen 325 MG tablet  Commonly known as:  TYLENOL  Take 650 mg by mouth every 6 (six) hours as needed.     acetaminophen 500 MG tablet  Commonly known as:  TYLENOL  Take 500 mg by mouth 2  (two) times daily.     amLODipine 10 MG tablet  Commonly known as:  NORVASC  Take 10 mg by mouth daily.     aspirin 81 MG tablet  Take 81 mg by mouth daily.     BYSTOLIC 10 MG tablet  Generic drug:  nebivolol  Take one tablet daily for blood pressusre     cetirizine 5 MG tablet  Commonly known as:  ZYRTEC  Take 5 mg by mouth daily.     clindamycin 150 MG capsule  Commonly known as:  CLEOCIN  Take 150 mg by mouth. Reported on 07/18/2015     escitalopram 10 MG tablet  Commonly known as:  LEXAPRO  Take one tablet daily     levothyroxine 25 MCG tablet  Commonly known as:  SYNTHROID, LEVOTHROID  Take 25 mcg by mouth daily.     NAMENDA XR 28 MG Cp24 24 hr capsule  Generic drug:  memantine  Take 28 mg by mouth.     polyethylene glycol packet  Commonly known as:  MIRALAX / GLYCOLAX  Take 17 g by mouth every 3 (three) days.     polyvinyl alcohol 1.4 % ophthalmic solution  Commonly known as:  LIQUIFILM TEARS  1 drop as needed for dry eyes.        Review of Systems  Constitutional: Negative for fever and chills.  HENT: Positive for hearing loss. Negative for congestion, ear discharge, ear pain, nosebleeds and sore throat.   Eyes: Negative for pain, discharge and redness.  Respiratory: Negative for cough, shortness of breath and stridor.   Cardiovascular: Negative for chest pain, palpitations and leg swelling.  Gastrointestinal: Negative for nausea, vomiting, abdominal pain, diarrhea and constipation.  Genitourinary: Positive for frequency. Negative for dysuria, urgency and flank pain.  Musculoskeletal: Negative for myalgias, back pain and neck pain.       Ambulates with walker.   Skin: Negative for rash.       Neck and upper back itching resolved  Neurological: Negative for dizziness, tremors, seizures, weakness and headaches.  Psychiatric/Behavioral: Negative for suicidal ideas and hallucinations. The patient is nervous/anxious.     Immunization History  Administered  Date(s) Administered  . DTaP 10/12/2012  . Influenza-Unspecified 11/22/2012, 12/26/2013, 11/12/2014  . PPD Test 12/06/2013  . Pneumococcal Polysaccharide-23 09/22/2000  . Td 07/08/2005  . Tdap 11/10/2013   Pertinent  Health Maintenance Due  Topic Date Due  . DEXA SCAN  11/03/1982  . PNA vac Low Risk Adult (2 of 2 - PCV13) 09/22/2001  . INFLUENZA VACCINE  09/23/2015   Fall Risk  01/13/2015 06/28/2013  Falls in the past year? No No  Risk for fall due to : Impaired balance/gait -   Functional Status Survey:  Filed Vitals:   07/18/15 1115  BP: 152/64  Pulse: 64  Temp: 97.9 F (36.6 C)  TempSrc: Oral  Resp: 17  Height: 5' (1.524 m)  Weight: 129 lb (58.514 kg)  SpO2: 98%   Body mass index is 25.19 kg/(m^2). Physical Exam  Constitutional: She is oriented to person, place, and time. She appears well-developed and well-nourished. No distress.  HENT:  Head: Normocephalic and atraumatic.  Right Ear: External ear normal.  Left Ear: External ear normal.  Nose: Nose normal.  Mouth/Throat: Oropharynx is clear and moist. No oropharyngeal exudate.  Eyes: Conjunctivae and EOM are normal. Pupils are equal, round, and reactive to light. Right eye exhibits no discharge. Left eye exhibits no discharge. No scleral icterus.  Neck: Normal range of motion. Neck supple. No JVD present. No tracheal deviation present. No thyromegaly present.  Cardiovascular: Normal rate, regular rhythm and intact distal pulses.  Exam reveals no friction rub.   Murmur heard. 2/6 systolic murmur  Pulmonary/Chest: Effort normal and breath sounds normal. No stridor. No respiratory distress. She has no wheezes. She has no rales.  Abdominal: Soft. Bowel sounds are normal. She exhibits no distension and no mass. There is no tenderness. There is no rebound and no guarding.  Genitourinary: Guaiac negative stool.  Musculoskeletal: Normal range of motion. She exhibits no edema or tenderness.  Lymphadenopathy:    She has  no cervical adenopathy.  Neurological: She is alert and oriented to person, place, and time. She has normal reflexes. No cranial nerve deficit. She exhibits abnormal muscle tone. Coordination normal.  Skin: Skin is warm. No rash noted. She is not diaphoretic. No erythema. No pallor.  Lemon sized Lentigo to left lower cheek  Psychiatric: Her mood appears anxious. Her affect is angry. Her affect is not blunt, not labile and not inappropriate. Her speech is not delayed, not tangential and not slurred. She is not agitated, not aggressive, not hyperactive, not slowed, not withdrawn, not actively hallucinating and not combative. Thought content is paranoid. Thought content is not delusional. Cognition and memory are impaired. She does not express impulsivity or inappropriate judgment. She exhibits a depressed mood. She expresses no homicidal and no suicidal ideation. She expresses no suicidal plans and no homicidal plans. She is communicative. She exhibits abnormal recent memory and abnormal remote memory.  Stabilized in MCU She is attentive.    Labs reviewed:  Recent Labs  12/26/14 07/22/15  NA 141 139  K 4.4 4.4  BUN 38* 32*  CREATININE 1.2* 1.2*    Recent Labs  12/26/14 07/22/15  AST 21 22  ALT 12 10  ALKPHOS 79 79    Recent Labs  12/26/14 07/22/15  WBC 4.1 10.0  HGB 11.1* 13.1  HCT 34* 38  PLT 232 195   Lab Results  Component Value Date   TSH 2.11 07/22/2015   No results found for: HGBA1C No results found for: CHOL, HDL, LDLCALC, LDLDIRECT, TRIG, CHOLHDL  Significant Diagnostic Results in last 30 days:  No results found.  Assessment/Plan Problem List Items Addressed This Visit    Senile dementia (Chronic)    stable in MCU, walking with walker a lot through out day, continue Namenda, update CBC and CMP        Hypothyroidism (Chronic)    Continue Levothyroxine 60mcg daily. TSH 1.98 08/20/14, 12/26/14 TSH 2.081, update TSH 07/22/15 2.11        HTN (hypertension) -  Primary (Chronic)    controlled. Continue Amlodipine 10mg , Bystolic 10mg  daily for  Bp. Off Diovan/HCT. Takes Atorvastatin and ASA for cardiovascular risk reduction. 07/22/15 Na 139, K 4.4, Bun 32, creat 1.16        Hiatal hernia    Asymptomatic, off acid reducer.         Depression with anxiety    Mood is stable, continue Lexapro 10mg  daily        Constipation    Stable, continue MiraLax and diet        Chronic renal disease (Chronic)    03/26/14 bun/creat 32/1.11 12/26/14 Bun/creat 38/1.1 07/22/15 Na 139, K 4.4, Bun 32, creat 1.16        Anemia of chronic disease (Chronic)    07/03/13 Hgb 8.9 07/03/13. Normal Iron 52, B12 641, Folate 15.5. 12/26/14 Hgb 11.1. Update CBC 07/22/15 13.1            Family/ staff Communication: continue SNF for care needs  Labs/tests ordered: none

## 2015-07-18 NOTE — Assessment & Plan Note (Signed)
Stable, continue MiraLax and diet

## 2015-07-18 NOTE — Assessment & Plan Note (Signed)
Mood is stable, continue Lexapro 10mg  daily

## 2015-07-18 NOTE — Assessment & Plan Note (Addendum)
03/26/14 bun/creat 32/1.11 12/26/14 Bun/creat 38/1.1 07/22/15 Na 139, K 4.4, Bun 32, creat 1.16

## 2015-07-18 NOTE — Assessment & Plan Note (Addendum)
Continue Levothyroxine 52mcg daily. TSH 1.98 08/20/14, 12/26/14 TSH 2.081, update TSH 07/22/15 2.11

## 2015-07-18 NOTE — Assessment & Plan Note (Addendum)
controlled. Continue Amlodipine 10mg , Bystolic 10mg  daily for Bp. Off Diovan/HCT. Takes Atorvastatin and ASA for cardiovascular risk reduction. 07/22/15 Na 139, K 4.4, Bun 32, creat 1.16

## 2015-07-22 DIAGNOSIS — H35351 Cystoid macular degeneration, right eye: Secondary | ICD-10-CM | POA: Diagnosis not present

## 2015-07-22 DIAGNOSIS — H353232 Exudative age-related macular degeneration, bilateral, with inactive choroidal neovascularization: Secondary | ICD-10-CM | POA: Diagnosis not present

## 2015-07-22 DIAGNOSIS — E039 Hypothyroidism, unspecified: Secondary | ICD-10-CM | POA: Diagnosis not present

## 2015-07-22 DIAGNOSIS — H35053 Retinal neovascularization, unspecified, bilateral: Secondary | ICD-10-CM | POA: Diagnosis not present

## 2015-07-22 DIAGNOSIS — H353134 Nonexudative age-related macular degeneration, bilateral, advanced atrophic with subfoveal involvement: Secondary | ICD-10-CM | POA: Diagnosis not present

## 2015-07-22 DIAGNOSIS — I10 Essential (primary) hypertension: Secondary | ICD-10-CM | POA: Diagnosis not present

## 2015-07-22 LAB — BASIC METABOLIC PANEL
BUN: 32 mg/dL — AB (ref 4–21)
Creatinine: 1.2 mg/dL — AB (ref <=1.1)
Glucose: 99 mg/dL
POTASSIUM: 4.4 mmol/L (ref 3.4–5.3)
SODIUM: 139 mmol/L (ref 137–147)

## 2015-07-22 LAB — HEPATIC FUNCTION PANEL
ALT: 10 U/L (ref 7–35)
AST: 22 U/L (ref 13–35)
Alkaline Phosphatase: 79 U/L (ref 25–125)
BILIRUBIN, TOTAL: 0.4 mg/dL

## 2015-07-22 LAB — CBC AND DIFFERENTIAL
HCT: 38 % (ref 36–46)
Hemoglobin: 13.1 g/dL (ref 12.0–16.0)
Platelets: 195 10*3/uL (ref 150–399)
WBC: 10 10*3/mL

## 2015-07-22 LAB — TSH: TSH: 2.11 u[IU]/mL (ref <=5.90)

## 2015-07-24 ENCOUNTER — Other Ambulatory Visit: Payer: Self-pay | Admitting: *Deleted

## 2015-09-11 DIAGNOSIS — R41841 Cognitive communication deficit: Secondary | ICD-10-CM | POA: Diagnosis not present

## 2015-09-11 DIAGNOSIS — R488 Other symbolic dysfunctions: Secondary | ICD-10-CM | POA: Diagnosis not present

## 2015-09-11 DIAGNOSIS — F0390 Unspecified dementia without behavioral disturbance: Secondary | ICD-10-CM | POA: Diagnosis not present

## 2015-09-11 DIAGNOSIS — Z9181 History of falling: Secondary | ICD-10-CM | POA: Diagnosis not present

## 2015-09-11 DIAGNOSIS — R413 Other amnesia: Secondary | ICD-10-CM | POA: Diagnosis not present

## 2015-09-11 DIAGNOSIS — R2689 Other abnormalities of gait and mobility: Secondary | ICD-10-CM | POA: Diagnosis not present

## 2015-09-11 DIAGNOSIS — M899 Disorder of bone, unspecified: Secondary | ICD-10-CM | POA: Diagnosis not present

## 2015-09-11 DIAGNOSIS — I10 Essential (primary) hypertension: Secondary | ICD-10-CM | POA: Diagnosis not present

## 2015-09-11 DIAGNOSIS — K5900 Constipation, unspecified: Secondary | ICD-10-CM | POA: Diagnosis not present

## 2015-09-11 DIAGNOSIS — E039 Hypothyroidism, unspecified: Secondary | ICD-10-CM | POA: Diagnosis not present

## 2015-09-11 DIAGNOSIS — H3530 Unspecified macular degeneration: Secondary | ICD-10-CM | POA: Diagnosis not present

## 2015-09-11 DIAGNOSIS — R29898 Other symptoms and signs involving the musculoskeletal system: Secondary | ICD-10-CM | POA: Diagnosis not present

## 2015-09-11 DIAGNOSIS — M6281 Muscle weakness (generalized): Secondary | ICD-10-CM | POA: Diagnosis not present

## 2015-09-11 DIAGNOSIS — M8580 Other specified disorders of bone density and structure, unspecified site: Secondary | ICD-10-CM | POA: Diagnosis not present

## 2015-09-11 DIAGNOSIS — F418 Other specified anxiety disorders: Secondary | ICD-10-CM | POA: Diagnosis not present

## 2015-09-11 DIAGNOSIS — R351 Nocturia: Secondary | ICD-10-CM | POA: Diagnosis not present

## 2015-09-11 DIAGNOSIS — R296 Repeated falls: Secondary | ICD-10-CM | POA: Diagnosis not present

## 2015-09-11 DIAGNOSIS — R262 Difficulty in walking, not elsewhere classified: Secondary | ICD-10-CM | POA: Diagnosis not present

## 2015-09-11 DIAGNOSIS — L299 Pruritus, unspecified: Secondary | ICD-10-CM | POA: Diagnosis not present

## 2015-09-12 DIAGNOSIS — F0390 Unspecified dementia without behavioral disturbance: Secondary | ICD-10-CM | POA: Diagnosis not present

## 2015-09-12 DIAGNOSIS — R509 Fever, unspecified: Secondary | ICD-10-CM | POA: Diagnosis not present

## 2015-09-24 DIAGNOSIS — R488 Other symbolic dysfunctions: Secondary | ICD-10-CM | POA: Diagnosis not present

## 2015-09-24 DIAGNOSIS — Z9181 History of falling: Secondary | ICD-10-CM | POA: Diagnosis not present

## 2015-09-24 DIAGNOSIS — R2689 Other abnormalities of gait and mobility: Secondary | ICD-10-CM | POA: Diagnosis not present

## 2015-09-24 DIAGNOSIS — R296 Repeated falls: Secondary | ICD-10-CM | POA: Diagnosis not present

## 2015-09-24 DIAGNOSIS — K5900 Constipation, unspecified: Secondary | ICD-10-CM | POA: Diagnosis not present

## 2015-09-24 DIAGNOSIS — I10 Essential (primary) hypertension: Secondary | ICD-10-CM | POA: Diagnosis not present

## 2015-09-24 DIAGNOSIS — R262 Difficulty in walking, not elsewhere classified: Secondary | ICD-10-CM | POA: Diagnosis not present

## 2015-09-24 DIAGNOSIS — E039 Hypothyroidism, unspecified: Secondary | ICD-10-CM | POA: Diagnosis not present

## 2015-09-24 DIAGNOSIS — H3530 Unspecified macular degeneration: Secondary | ICD-10-CM | POA: Diagnosis not present

## 2015-09-24 DIAGNOSIS — F418 Other specified anxiety disorders: Secondary | ICD-10-CM | POA: Diagnosis not present

## 2015-09-24 DIAGNOSIS — M6281 Muscle weakness (generalized): Secondary | ICD-10-CM | POA: Diagnosis not present

## 2015-09-24 DIAGNOSIS — R351 Nocturia: Secondary | ICD-10-CM | POA: Diagnosis not present

## 2015-09-24 DIAGNOSIS — R41841 Cognitive communication deficit: Secondary | ICD-10-CM | POA: Diagnosis not present

## 2015-09-24 DIAGNOSIS — M899 Disorder of bone, unspecified: Secondary | ICD-10-CM | POA: Diagnosis not present

## 2015-09-24 DIAGNOSIS — L299 Pruritus, unspecified: Secondary | ICD-10-CM | POA: Diagnosis not present

## 2015-09-24 DIAGNOSIS — R413 Other amnesia: Secondary | ICD-10-CM | POA: Diagnosis not present

## 2015-09-24 DIAGNOSIS — F0390 Unspecified dementia without behavioral disturbance: Secondary | ICD-10-CM | POA: Diagnosis not present

## 2015-09-24 DIAGNOSIS — M8580 Other specified disorders of bone density and structure, unspecified site: Secondary | ICD-10-CM | POA: Diagnosis not present

## 2015-09-24 DIAGNOSIS — R29898 Other symptoms and signs involving the musculoskeletal system: Secondary | ICD-10-CM | POA: Diagnosis not present

## 2015-10-13 ENCOUNTER — Non-Acute Institutional Stay (SKILLED_NURSING_FACILITY): Payer: PPO | Admitting: Internal Medicine

## 2015-10-13 ENCOUNTER — Encounter: Payer: Self-pay | Admitting: Internal Medicine

## 2015-10-13 DIAGNOSIS — F0390 Unspecified dementia without behavioral disturbance: Secondary | ICD-10-CM

## 2015-10-13 DIAGNOSIS — E039 Hypothyroidism, unspecified: Secondary | ICD-10-CM

## 2015-10-13 DIAGNOSIS — I10 Essential (primary) hypertension: Secondary | ICD-10-CM | POA: Diagnosis not present

## 2015-10-13 DIAGNOSIS — D638 Anemia in other chronic diseases classified elsewhere: Secondary | ICD-10-CM | POA: Diagnosis not present

## 2015-10-13 DIAGNOSIS — N182 Chronic kidney disease, stage 2 (mild): Secondary | ICD-10-CM

## 2015-10-13 NOTE — Progress Notes (Signed)
Progress Note    Location:   [facility] Room Number: N107 Place of Service:  SNF [phone]) Provider:  Jeanmarie Hubert, MD  Patient Care Team: Estill Dooms, MD as PCP - General (Internal Medicine) Man Otho Darner, NP as Nurse Practitioner (Nurse Practitioner) Wilford Corner, MD as Consulting Physician (Gastroenterology) Sanda Klein, MD as Consulting Physician (Cardiology)  Extended Emergency Contact Information Primary Emergency Contact: [name],Pat Address: [place] [address] [name] of [place] Phone: [phone] Mobile Phone: [phone] Relation: Daughter  Code Status:  DNR Goals of care: Advanced Directive information Advanced Directives 10/13/2015  Does patient have an advance directive? Yes  Type of Advance Directive Out of facility DNR (pink MOST or yellow form)  Does patient want to make changes to advanced directive? -  Copy of advanced directive(s) in chart? Yes  Pre-existing out of facility DNR order (yellow form or pink MOST form) -     Chief Complaint  Patient presents with  . Medical Management of Chronic Issues    routine medication management    HPI:  Pt is a 82 y.o. female seen today for medical management of chronic diseases.    Senile dementia, without behavioral disturbance - UNCHANGED  Chronic renal disease, stage 2 (mild) - stable  Essential hypertension - controlled  Anemia of chronic disease - improved  Hypothyroidism, unspecified hypothyroidism type - compensated    Past Medical History:  Diagnosis Date  . Anemia, unspecified   . Anorexia   . Anxiety   . Arthritis   . Atherosclerotic cerebrovascular disease   . Backache, unspecified   . Cataract   . Cholelithiasis   . Chronic kidney disease, unspecified ([place])   . Closed fracture of right inferior pubic ramus ([place]) 11/12/2013   11/10/13 s/p fall, ED eval  X-ray R hip  1. Possible nondisplaced right inferior pubic ramus  fracture.  2. No femur fracture or dislocation   02/06/14 dc prn Motrin and Norco-not used    . Depression   . Diaphragmatic hernia without mention of obstruction or gangrene   . Diseases of lips 03/26/2013  . Diverticulosis of colon (without mention of hemorrhage)   . Hemorrhage of rectum and anus 03/26/2013  . Hyperlipidemia   . Hypertension   . Insomnia, unspecified   . Laceration of occipital scalp 11/12/2013  . Macular degeneration 06/28/2013  . Macular degeneration (senile) of retina, unspecified   . Osteoporosis   . Other malaise and fatigue   . Other sleep disturbances   . Other specified cardiac dysrhythmias(427.89)   . Other specified personal history presenting hazards to health(V15.89)   . Other symptoms involving cardiovascular system   . Pancreatitis   . Personal history of other diseases of circulatory system   . Personal history of other diseases of digestive system    pancreatitis from gallstones  . Personal history of other diseases of respiratory system   . Personal history of traumatic fracture   . Rosacea   . Senile osteoporosis    with old T11 fracture  . Thyroid disease   . Unspecified disorders of arteries and arterioles   . Unspecified hearing loss   . Unspecified hemorrhoids without mention of complication    Past Surgical History:  Procedure Laterality Date  . ABDOMINAL HYSTERECTOMY     and BSO fibroids  . COLONOSCOPY  05/23/2007  . JOINT REPLACEMENT  1996  . TOTAL HIP ARTHROPLASTY Right  Allergies  Allergen Reactions  . Ace Inhibitors     cough  . Mirtazapine     Nightmares   . Penicillins Rash      Medication List       Accurate as of 10/13/15  3:19 PM. Always use your most recent med list.          acetaminophen 325 MG tablet Commonly known as:  TYLENOL Take 650 mg by mouth every 6 (six) hours as needed.   acetaminophen 500 MG tablet Commonly known as:  TYLENOL Take 500 mg by mouth 2 (two) times daily.   amLODipine 10 MG  tablet Commonly known as:  NORVASC Take 10 mg by mouth daily.   aspirin 81 MG tablet Take 81 mg by mouth daily.   BYSTOLIC 10 MG tablet Generic drug:  nebivolol Take one tablet daily for blood pressusre   cetirizine 5 MG tablet Commonly known as:  ZYRTEC Take 5 mg by mouth daily.   escitalopram 10 MG tablet Commonly known as:  LEXAPRO Take one tablet daily   levothyroxine 25 MCG tablet Commonly known as:  SYNTHROID, LEVOTHROID Take 25 mcg by mouth daily.   NAMENDA XR 28 MG Cp24 24 hr capsule Generic drug:  memantine Take 28 mg by mouth.   polyethylene glycol packet Commonly known as:  MIRALAX / GLYCOLAX Take 17 g by mouth every 3 (three) days.   polyvinyl alcohol 1.4 % ophthalmic solution Commonly known as:  LIQUIFILM TEARS 1 drop as needed for dry eyes.       Review of Systems  Constitutional: Negative for chills and fever.  HENT: Positive for hearing loss. Negative for congestion, ear discharge, ear pain, nosebleeds and sore throat.   Eyes: Negative for pain, discharge and redness.  Respiratory: Negative for cough, shortness of breath and stridor.   Cardiovascular: Negative for chest pain, palpitations and leg swelling.  Gastrointestinal: Negative for abdominal pain, constipation, diarrhea, nausea and vomiting.  Genitourinary: Positive for frequency. Negative for dysuria, flank pain and urgency.  Musculoskeletal: Negative for back pain, myalgias and neck pain.       Ambulates with walker.   Skin: Negative for rash.       Neck and upper back itching resolved  Neurological: Negative for dizziness, tremors, seizures, weakness and headaches.  Hematological:       Hx anemia  Psychiatric/Behavioral: Negative for hallucinations and suicidal ideas. The patient is nervous/anxious.     Immunization History  Administered Date(s) Administered  . DTaP 10/12/2012  . Influenza-Unspecified 11/22/2012, 12/26/2013, 11/12/2014  . PPD Test 12/06/2013  . Pneumococcal  Polysaccharide-23 09/22/2000  . Td 07/08/2005  . Tdap 11/10/2013   Pertinent  Health Maintenance Due  Topic Date Due  . DEXA SCAN  May 18, 201984  . PNA vac Low Risk Adult (2 of 2 - PCV13) 09/22/2001  . INFLUENZA VACCINE  09/23/2015   Fall Risk  01/13/2015 06/28/2013  Falls in the past year? No No  Risk for fall due to : Impaired balance/gait -   Functional Status Survey:    Vitals:   10/13/15 1512  BP: 118/60  Pulse: 72  Resp: 18  Temp: 98 F (36.7 C)  Weight: 127 lb (57.6 kg)  Height: 5' (1.524 m)   Body mass index is 24.8 kg/m. Physical Exam  Constitutional: She appears well-developed and well-nourished. No distress.  HENT:  Head: Normocephalic and atraumatic.  Right Ear: External ear normal.  Left Ear: External ear normal.  Nose: Nose normal.  Mouth/Throat: Oropharynx is  clear and moist. No oropharyngeal exudate.  Eyes: Conjunctivae and EOM are normal. Pupils are equal, round, and reactive to light. Right eye exhibits no discharge. Left eye exhibits no discharge. No scleral icterus.  Neck: Normal range of motion. Neck supple. No JVD present. No tracheal deviation present. No thyromegaly present.  Cardiovascular: Normal rate, regular rhythm and intact distal pulses.   Murmur (2/6/SEM) heard. Pulmonary/Chest: Effort normal and breath sounds normal. No stridor. No respiratory distress. She has no wheezes. She has no rales.  Abdominal: Soft. Bowel sounds are normal. She exhibits no distension and no mass. There is no tenderness. There is no rebound and no guarding.  Genitourinary: Rectal exam shows guaiac negative stool.  Musculoskeletal: Normal range of motion. She exhibits no edema or tenderness.  Lymphadenopathy:    She has no cervical adenopathy.  Neurological: She is alert. She has normal reflexes. No cranial nerve deficit. She exhibits abnormal muscle tone. Coordination normal.  Skin: Skin is warm. No rash noted. She is not diaphoretic. No erythema. No pallor.  Lemon  sized Lentigo to left lower cheek  Psychiatric: She has a normal mood and affect. Her affect is not blunt, not labile and not inappropriate. Her speech is not delayed, not tangential and not slurred. She is not agitated, not aggressive, not hyperactive, not slowed, not withdrawn, not actively hallucinating and not combative. Thought content is paranoid. Thought content is not delusional. Cognition and memory are impaired. She expresses inappropriate judgment. She does not express impulsivity. She expresses no homicidal and no suicidal ideation. She expresses no suicidal plans and no homicidal plans. She is communicative. She exhibits abnormal recent memory and abnormal remote memory. She is attentive.    Labs reviewed:  Recent Labs  12/26/14 07/22/15  NA 141 139  K 4.4 4.4  BUN 38* 32*  CREATININE 1.2* 1.2*    Recent Labs  12/26/14 07/22/15  AST 21 22  ALT 12 10  ALKPHOS 79 79    Recent Labs  12/26/14 07/22/15  WBC 4.1 10.0  HGB 11.1* 13.1  HCT 34* 38  PLT 232 195   Lab Results  Component Value Date   TSH 2.11 07/22/2015    Assessment/Plan 1. Senile dementia, without behavioral disturbance unchanged  2. Chronic renal disease, stage 2 (mild) stable  3. Essential hypertension controlled  4. Anemia of chronic disease Improved. Hgb 13. 1 on 07/22/15  5. Hypothyroidism, unspecified hypothyroidism type compensated

## 2015-10-28 DIAGNOSIS — M899 Disorder of bone, unspecified: Secondary | ICD-10-CM | POA: Diagnosis not present

## 2015-10-28 DIAGNOSIS — R351 Nocturia: Secondary | ICD-10-CM | POA: Diagnosis not present

## 2015-10-28 DIAGNOSIS — H3530 Unspecified macular degeneration: Secondary | ICD-10-CM | POA: Diagnosis not present

## 2015-10-28 DIAGNOSIS — Z9181 History of falling: Secondary | ICD-10-CM | POA: Diagnosis not present

## 2015-10-28 DIAGNOSIS — F418 Other specified anxiety disorders: Secondary | ICD-10-CM | POA: Diagnosis not present

## 2015-10-28 DIAGNOSIS — R413 Other amnesia: Secondary | ICD-10-CM | POA: Diagnosis not present

## 2015-10-28 DIAGNOSIS — M8580 Other specified disorders of bone density and structure, unspecified site: Secondary | ICD-10-CM | POA: Diagnosis not present

## 2015-10-28 DIAGNOSIS — K5900 Constipation, unspecified: Secondary | ICD-10-CM | POA: Diagnosis not present

## 2015-10-28 DIAGNOSIS — R2689 Other abnormalities of gait and mobility: Secondary | ICD-10-CM | POA: Diagnosis not present

## 2015-10-28 DIAGNOSIS — R262 Difficulty in walking, not elsewhere classified: Secondary | ICD-10-CM | POA: Diagnosis not present

## 2015-10-28 DIAGNOSIS — E039 Hypothyroidism, unspecified: Secondary | ICD-10-CM | POA: Diagnosis not present

## 2015-10-28 DIAGNOSIS — L299 Pruritus, unspecified: Secondary | ICD-10-CM | POA: Diagnosis not present

## 2015-10-28 DIAGNOSIS — M6281 Muscle weakness (generalized): Secondary | ICD-10-CM | POA: Diagnosis not present

## 2015-10-28 DIAGNOSIS — R41841 Cognitive communication deficit: Secondary | ICD-10-CM | POA: Diagnosis not present

## 2015-10-28 DIAGNOSIS — I10 Essential (primary) hypertension: Secondary | ICD-10-CM | POA: Diagnosis not present

## 2015-10-28 DIAGNOSIS — R296 Repeated falls: Secondary | ICD-10-CM | POA: Diagnosis not present

## 2015-10-28 DIAGNOSIS — R488 Other symbolic dysfunctions: Secondary | ICD-10-CM | POA: Diagnosis not present

## 2015-10-28 DIAGNOSIS — F0390 Unspecified dementia without behavioral disturbance: Secondary | ICD-10-CM | POA: Diagnosis not present

## 2015-10-28 DIAGNOSIS — R29898 Other symptoms and signs involving the musculoskeletal system: Secondary | ICD-10-CM | POA: Diagnosis not present

## 2015-11-24 ENCOUNTER — Encounter: Payer: Self-pay | Admitting: Nurse Practitioner

## 2015-11-24 ENCOUNTER — Non-Acute Institutional Stay (SKILLED_NURSING_FACILITY): Payer: PPO | Admitting: Nurse Practitioner

## 2015-11-24 DIAGNOSIS — E039 Hypothyroidism, unspecified: Secondary | ICD-10-CM | POA: Diagnosis not present

## 2015-11-24 DIAGNOSIS — K5900 Constipation, unspecified: Secondary | ICD-10-CM | POA: Diagnosis not present

## 2015-11-24 DIAGNOSIS — F418 Other specified anxiety disorders: Secondary | ICD-10-CM | POA: Diagnosis not present

## 2015-11-24 DIAGNOSIS — M159 Polyosteoarthritis, unspecified: Secondary | ICD-10-CM

## 2015-11-24 DIAGNOSIS — I10 Essential (primary) hypertension: Secondary | ICD-10-CM | POA: Diagnosis not present

## 2015-11-24 DIAGNOSIS — M1990 Unspecified osteoarthritis, unspecified site: Secondary | ICD-10-CM | POA: Insufficient documentation

## 2015-11-24 DIAGNOSIS — F0390 Unspecified dementia without behavioral disturbance: Secondary | ICD-10-CM

## 2015-11-24 DIAGNOSIS — M150 Primary generalized (osteo)arthritis: Secondary | ICD-10-CM

## 2015-11-24 NOTE — Assessment & Plan Note (Signed)
Mood is stable, continue Lexapro 5mg  daily

## 2015-11-24 NOTE — Assessment & Plan Note (Signed)
Mild, involved multiple joints, continue Tylenol 500mg  bid.

## 2015-11-24 NOTE — Assessment & Plan Note (Signed)
stable in MCU, walking with walker a lot through out day, continue Namenda 28mg  daily.

## 2015-11-24 NOTE — Assessment & Plan Note (Signed)
controlled. Continue Amlodipine 10mg , Bystolic 10mg  daily for Bp. Off Diovan/HCT. Takes Atorvastatin and ASA for cardiovascular risk reduction. 07/22/15 Na 139, K 4.4, Bun 32, creat 1.16

## 2015-11-24 NOTE — Assessment & Plan Note (Signed)
Stable, continue MiraLax q 3 days.  

## 2015-11-24 NOTE — Assessment & Plan Note (Signed)
Continue Levothyroxine 79mcg daily. TSH 07/22/15 2.11

## 2015-11-24 NOTE — Progress Notes (Signed)
Location:  [facility] Room Number: 107 Place of Service:  SNF (31) Provider:  Daje Stark, [place]ie NP  Jeanmarie Hubert, MD  Patient Care Team: Estill Dooms, MD as PCP - General (Internal Medicine) Shannell Mikkelsen Otho Darner, NP as Nurse Practitioner (Nurse Practitioner) Wilford Corner, MD as Consulting Physician (Gastroenterology) Sanda Klein, MD as Consulting Physician (Cardiology)  Extended Emergency Contact Information Primary Emergency Contact: [name],Pat Address: [place] [address] [name] of [place] Phone: [phone] Mobile Phone: [phone] Relation: Daughter  Code Status:  DNR Goals of care: Advanced Directive information Advanced Directives 11/24/2015  Does patient have an advance directive? Yes  Type of Advance Directive Out of facility DNR (pink MOST or yellow form)  Does patient want to make changes to advanced directive? No - Patient declined  Copy of advanced directive(s) in chart? Yes  Pre-existing out of facility DNR order (yellow form or pink MOST form) -     Chief Complaint  Patient presents with  . Medical Management of Chronic Issues    HPI:  Pt is a 82 y.o. female seen today for medical management of chronic diseases.    Hx of hypothyroidism, taking Levothyroxine 30mcg, last TSH 2.11 07/22/15,  HTN, controlled on Amlodipine 10mg  and Bystolic 10mg ,  Dementia, resides in [facility], on Namenda for memory,  Depression, mood is stable, taking Lexapro 5mg  daiily.   Past Medical History:  Diagnosis Date  . Anemia, unspecified   . Anorexia   . Anxiety   . Arthritis   . Atherosclerotic cerebrovascular disease   . Backache, unspecified   . Cataract   . Cholelithiasis   . Chronic kidney disease, unspecified   . Closed fracture of right inferior pubic ramus ([place]) 11/12/2013   11/10/13 s/p fall, ED eval  X-ray R hip  1. Possible nondisplaced right inferior pubic ramus fracture.  2. No femur fracture or  dislocation   02/06/14 dc prn Motrin and Norco-not used    . Depression   . Diaphragmatic hernia without mention of obstruction or gangrene   . Diseases of lips 03/26/2013  . Diverticulosis of colon (without mention of hemorrhage)   . Hemorrhage of rectum and anus 03/26/2013  . Hyperlipidemia   . Hypertension   . Insomnia, unspecified   . Laceration of occipital scalp 11/12/2013  . Macular degeneration 06/28/2013  . Macular degeneration (senile) of retina, unspecified   . Osteoporosis   . Other malaise and fatigue   . Other sleep disturbances   . Other specified cardiac dysrhythmias(427.89)   . Other specified personal history presenting hazards to health(V15.89)   . Other symptoms involving cardiovascular system   . Pancreatitis   . Personal history of other diseases of circulatory system   . Personal history of other diseases of digestive system    pancreatitis from gallstones  . Personal history of other diseases of respiratory system   . Personal history of traumatic fracture   . Rosacea   . Senile osteoporosis    with old T11 fracture  . Thyroid disease   . Unspecified disorders of arteries and arterioles   . Unspecified hearing loss   . Unspecified hemorrhoids without mention of complication    Past Surgical History:  Procedure Laterality Date  . ABDOMINAL HYSTERECTOMY     and BSO fibroids  . COLONOSCOPY  05/23/2007  . JOINT REPLACEMENT  1996  . TOTAL HIP ARTHROPLASTY Right  Allergies  Allergen Reactions  . Ace Inhibitors     cough  . Mirtazapine     Nightmares   . Penicillins Rash      Medication List       Accurate as of 11/24/15  3:21 PM. Always use your most recent med list.          acetaminophen 325 MG tablet Commonly known as:  TYLENOL Take 650 mg by mouth every 6 (six) hours as needed.   acetaminophen 500 MG tablet Commonly known as:  TYLENOL Take 500 mg by mouth 2 (two) times daily.   amLODipine 10 MG tablet Commonly known as:   NORVASC Take 10 mg by mouth daily.   aspirin 81 MG tablet Take 81 mg by mouth daily.   BYSTOLIC 10 MG tablet Generic drug:  nebivolol Take one tablet daily for blood pressusre   cetirizine 5 MG tablet Commonly known as:  ZYRTEC Take 5 mg by mouth daily.   clindamycin 150 MG capsule Commonly known as:  CLEOCIN Take 4(1000) capsule by mouth 1 hr prior to dental appointment.   escitalopram 10 MG tablet Commonly known as:  LEXAPRO Take one tablet daily   levothyroxine 25 MCG tablet Commonly known as:  SYNTHROID, LEVOTHROID Take 25 mcg by mouth daily.   NAMENDA XR 28 MG Cp24 24 hr capsule Generic drug:  memantine Take 28 mg by mouth.   polyethylene glycol packet Commonly known as:  MIRALAX / GLYCOLAX Take 17 g by mouth every 3 (three) days.   polyvinyl alcohol 1.4 % ophthalmic solution Commonly known as:  LIQUIFILM TEARS 1 drop as needed for dry eyes.       Review of Systems  Constitutional: Negative for chills and fever.  HENT: Positive for hearing loss. Negative for congestion, ear discharge, ear pain, nosebleeds and sore throat.   Eyes: Negative for pain, discharge and redness.  Respiratory: Negative for cough, shortness of breath and stridor.   Cardiovascular: Negative for chest pain, palpitations and leg swelling.  Gastrointestinal: Negative for abdominal pain, constipation, diarrhea, nausea and vomiting.  Genitourinary: Positive for frequency. Negative for dysuria, flank pain and urgency.  Musculoskeletal: Negative for back pain, myalgias and neck pain.       Ambulates with walker.   Skin: Negative for rash.       Neck and upper back itching resolved  Neurological: Negative for dizziness, tremors, seizures, weakness and headaches.  Hematological:       Hx anemia  Psychiatric/Behavioral: Negative for hallucinations and suicidal ideas. The patient is nervous/anxious.     Immunization History  Administered Date(s) Administered  . DTaP 10/12/2012  .  Influenza-Unspecified 11/22/2012, 12/26/2013, 11/12/2014  . PPD Test 12/06/2013  . Pneumococcal Polysaccharide-23 09/22/2000  . Td 07/08/2005  . Tdap 11/10/2013   Pertinent  Health Maintenance Due  Topic Date Due  . DEXA SCAN  Feb 02, 201984  . PNA vac Low Risk Adult (2 of 2 - PCV13) 09/22/2001  . INFLUENZA VACCINE  09/23/2015   Fall Risk  01/13/2015 06/28/2013  Falls in the past year? No No  Risk for fall due to : Impaired balance/gait -   Functional Status Survey:    Vitals:   11/24/15 1439  BP: (!) 144/70  Pulse: 70  Resp: 16  Temp: 97.9 F (36.6 C)  Weight: 127 lb 9.6 oz (57.9 kg)  Height: 5' (1.524 m)   Body mass index is 24.92 kg/m. Physical Exam  Constitutional: She appears well-developed and well-nourished. No distress.  HENT:  Head: Normocephalic and atraumatic.  Right Ear: External ear normal.  Left Ear: External ear normal.  Nose: Nose normal.  Mouth/Throat: Oropharynx is clear and moist. No oropharyngeal exudate.  Eyes: Conjunctivae and EOM are normal. Pupils are equal, round, and reactive to light. Right eye exhibits no discharge. Left eye exhibits no discharge. No scleral icterus.  Neck: Normal range of motion. Neck supple. No JVD present. No tracheal deviation present. No thyromegaly present.  Cardiovascular: Normal rate, regular rhythm and intact distal pulses.   Murmur (2/6/SEM) heard. Pulmonary/Chest: Effort normal and breath sounds normal. No stridor. No respiratory distress. She has no wheezes. She has no rales.  Abdominal: Soft. Bowel sounds are normal. She exhibits no distension and no mass. There is no tenderness. There is no rebound and no guarding.  Genitourinary: Rectal exam shows guaiac negative stool.  Musculoskeletal: Normal range of motion. She exhibits no edema or tenderness.  Lymphadenopathy:    She has no cervical adenopathy.  Neurological: She is alert. She has normal reflexes. No cranial nerve deficit. She exhibits abnormal muscle tone.  Coordination normal.  Skin: Skin is warm. No rash noted. She is not diaphoretic. No erythema. No pallor.  Lemon sized Lentigo to left lower cheek  Psychiatric: She has a normal mood and affect. Her affect is not blunt, not labile and not inappropriate. Her speech is not delayed, not tangential and not slurred. She is not agitated, not aggressive, not hyperactive, not slowed, not withdrawn, not actively hallucinating and not combative. Thought content is paranoid. Thought content is not delusional. Cognition and memory are impaired. She expresses inappropriate judgment. She does not express impulsivity. She expresses no homicidal and no suicidal ideation. She expresses no suicidal plans and no homicidal plans. She is communicative. She exhibits abnormal recent memory and abnormal remote memory. She is attentive.    Labs reviewed:  Recent Labs  12/26/14 07/22/15  NA 141 139  K 4.4 4.4  BUN 38* 32*  CREATININE 1.2* 1.2*    Recent Labs  12/26/14 07/22/15  AST 21 22  ALT 12 10  ALKPHOS 79 79    Recent Labs  12/26/14 07/22/15  WBC 4.1 10.0  HGB 11.1* 13.1  HCT 34* 38  PLT 232 195   Lab Results  Component Value Date   TSH 2.11 07/22/2015   No results found for: HGBA1C No results found for: CHOL, HDL, LDLCALC, LDLDIRECT, TRIG, CHOLHDL  Significant Diagnostic Results in last 30 days:  No results found.  Assessment/Plan There are no diagnoses linked to this encounter.   Family/ staff Communication:   Labs/tests ordered:

## 2015-12-29 ENCOUNTER — Encounter: Payer: Self-pay | Admitting: Nurse Practitioner

## 2015-12-29 ENCOUNTER — Non-Acute Institutional Stay (SKILLED_NURSING_FACILITY): Payer: PPO | Admitting: Nurse Practitioner

## 2015-12-29 DIAGNOSIS — F0390 Unspecified dementia without behavioral disturbance: Secondary | ICD-10-CM | POA: Diagnosis not present

## 2015-12-29 DIAGNOSIS — F418 Other specified anxiety disorders: Secondary | ICD-10-CM | POA: Diagnosis not present

## 2015-12-29 DIAGNOSIS — N182 Chronic kidney disease, stage 2 (mild): Secondary | ICD-10-CM

## 2015-12-29 DIAGNOSIS — M159 Polyosteoarthritis, unspecified: Secondary | ICD-10-CM

## 2015-12-29 DIAGNOSIS — K5900 Constipation, unspecified: Secondary | ICD-10-CM | POA: Diagnosis not present

## 2015-12-29 DIAGNOSIS — I10 Essential (primary) hypertension: Secondary | ICD-10-CM

## 2015-12-29 DIAGNOSIS — E039 Hypothyroidism, unspecified: Secondary | ICD-10-CM | POA: Diagnosis not present

## 2015-12-29 DIAGNOSIS — M150 Primary generalized (osteo)arthritis: Secondary | ICD-10-CM

## 2015-12-29 NOTE — Assessment & Plan Note (Signed)
stable in [facility], walking with walker a lot through out day, continue Namenda, update CBC and CMP

## 2015-12-29 NOTE — Assessment & Plan Note (Signed)
Mild, involved multiple joints, continue Tylenol 500mg  bid.

## 2015-12-29 NOTE — Assessment & Plan Note (Signed)
controlled. Continue Amlodipine 10mg , Bystolic 10mg  daily for Bp. Off Diovan/HCT. Takes Atorvastatin and ASA for cardiovascular risk reduction. 07/22/15 Na 139, K 4.4, Bun 32, creat 1.16  update CBC CMP TSH

## 2015-12-29 NOTE — Assessment & Plan Note (Signed)
Stable, continue MiraLax q 3 days.  

## 2015-12-29 NOTE — Assessment & Plan Note (Signed)
Continue Levothyroxine 69mcg daily. TSH 07/22/15 2.11. Update TSH

## 2015-12-29 NOTE — Assessment & Plan Note (Signed)
Mood is stable, continue Lexapro

## 2015-12-29 NOTE — Progress Notes (Signed)
Location:  [facility] Room Number: 107 Place of Service:  SNF (31) Provider:  Findley Blankenbaker, Manxie  NP  Jeanmarie Hubert, MD  Patient Care Team: Estill Dooms, MD as PCP - General (Internal Medicine) Giannie Soliday Otho Darner, NP as Nurse Practitioner (Nurse Practitioner) Wilford Corner, MD as Consulting Physician (Gastroenterology) Sanda Klein, MD as Consulting Physician (Cardiology)  Extended Emergency Contact Information Primary Emergency Contact: [name],Pat Address: [place] [address] [name] Phone: [phone] Mobile Phone: [phone] Relation: Daughter  Code Status:  DNR Goals of care: Advanced Directive information Advanced Directives 12/29/2015  Does patient have an advance directive? Yes  Type of Advance Directive Out of facility DNR (pink MOST or yellow form)  Does patient want to make changes to advanced directive? No - Patient declined  Copy of advanced directive(s) in chart? Yes  Pre-existing out of facility DNR order (yellow form or pink MOST form) -     Chief Complaint  Patient presents with  . Medical Management of Chronic Issues    HPI:  Pt is a 82 y.o. female seen today for medical management of chronic diseases.    Hx of hypothyroidism, taking Levothyroxine 50mcg, last TSH 2.11 07/22/15, HTN, controlled on Amlodipine 10mg  and Bystolic 10mg , Dementia, resides in [place] [facility], on Namenda for memory, Depression, mood is stable, taking Lexapro 5mg  daiily.     Past Medical History:  Diagnosis Date  . Anemia, unspecified   . Anorexia   . Anxiety   . Arthritis   . Atherosclerotic cerebrovascular disease   . Backache, unspecified   . Cataract   . Cholelithiasis   . Chronic kidney disease, unspecified   . Closed fracture of right inferior pubic ramus ([place]) 11/12/2013   11/10/13 s/p fall, ED eval  X-ray R hip  1. Possible nondisplaced right inferior pubic ramus fracture.  2. No femur fracture or  dislocation   02/06/14 dc prn Motrin and Norco-not used    . Depression   . Diaphragmatic hernia without mention of obstruction or gangrene   . Diseases of lips 03/26/2013  . Diverticulosis of colon (without mention of hemorrhage)   . Hemorrhage of rectum and anus 03/26/2013  . Hyperlipidemia   . Hypertension   . Insomnia, unspecified   . Laceration of occipital scalp 11/12/2013  . Macular degeneration 06/28/2013  . Macular degeneration (senile) of retina, unspecified   . Osteoporosis   . Other malaise and fatigue   . Other sleep disturbances   . Other specified cardiac dysrhythmias(427.89)   . Other specified personal history presenting hazards to health(V15.89)   . Other symptoms involving cardiovascular system   . Pancreatitis   . Personal history of other diseases of circulatory system   . Personal history of other diseases of digestive system    pancreatitis from gallstones  . Personal history of other diseases of respiratory system   . Personal history of traumatic fracture   . Rosacea   . Senile osteoporosis    with old T11 fracture  . Thyroid disease   . Unspecified disorders of arteries and arterioles   . Unspecified hearing loss   . Unspecified hemorrhoids without mention of complication    Past Surgical History:  Procedure Laterality Date  . ABDOMINAL HYSTERECTOMY     and BSO fibroids  . COLONOSCOPY  05/23/2007  . JOINT REPLACEMENT  1996  . TOTAL HIP ARTHROPLASTY Right  Allergies  Allergen Reactions  . Ace Inhibitors     cough  . Mirtazapine     Nightmares   . Penicillins Rash      Medication List       Accurate as of 12/29/15  2:11 PM. Always use your most recent med list.          acetaminophen 325 MG tablet Commonly known as:  TYLENOL Take 650 mg by mouth every 6 (six) hours as needed.   acetaminophen 500 MG tablet Commonly known as:  TYLENOL Take 500 mg by mouth 2 (two) times daily.   amLODipine 10 MG tablet Commonly known as:   NORVASC Take 10 mg by mouth daily.   aspirin 81 MG tablet Take 81 mg by mouth daily.   BYSTOLIC 10 MG tablet Generic drug:  nebivolol Take one tablet daily for blood pressusre   cetirizine 5 MG tablet Commonly known as:  ZYRTEC Take 5 mg by mouth daily.   clindamycin 150 MG capsule Commonly known as:  CLEOCIN Take 4(1000) capsule by mouth 1 hr prior to dental appointment.   escitalopram 10 MG tablet Commonly known as:  LEXAPRO Take one tablet daily   escitalopram 5 MG tablet Commonly known as:  LEXAPRO Take 5 mg by mouth daily.   levothyroxine 25 MCG tablet Commonly known as:  SYNTHROID, LEVOTHROID Take 25 mcg by mouth daily before breakfast.   NAMENDA XR 28 MG Cp24 24 hr capsule Generic drug:  memantine Take 28 mg by mouth.   polyethylene glycol packet Commonly known as:  MIRALAX / GLYCOLAX Take 17 g by mouth every 3 (three) days.   polyvinyl alcohol 1.4 % ophthalmic solution Commonly known as:  LIQUIFILM TEARS 1 drop as needed for dry eyes.       Review of Systems  Constitutional: Negative for chills and fever.  HENT: Positive for hearing loss. Negative for congestion, ear discharge, ear pain, nosebleeds and sore throat.   Eyes: Negative for pain, discharge and redness.  Respiratory: Negative for cough, shortness of breath and stridor.   Cardiovascular: Negative for chest pain, palpitations and leg swelling.  Gastrointestinal: Negative for abdominal pain, constipation, diarrhea, nausea and vomiting.  Genitourinary: Positive for frequency. Negative for dysuria, flank pain and urgency.  Musculoskeletal: Negative for back pain, myalgias and neck pain.       Ambulates with walker.   Skin: Negative for rash.       Neck and upper back itching resolved  Neurological: Negative for dizziness, tremors, seizures, weakness and headaches.  Hematological:       Hx anemia  Psychiatric/Behavioral: Negative for hallucinations and suicidal ideas. The patient is  nervous/anxious.     Immunization History  Administered Date(s) Administered  . DTaP 10/12/2012  . Influenza-Unspecified 11/22/2012, 12/26/2013, 11/12/2014, 12/09/2015  . PPD Test 12/06/2013  . Pneumococcal Polysaccharide-23 09/22/2000  . Td 07/08/2005  . Tdap 11/10/2013   Pertinent  Health Maintenance Due  Topic Date Due  . DEXA SCAN  2019-06-182  . PNA vac Low Risk Adult (2 of 2 - PCV13) 09/22/2001  . INFLUENZA VACCINE  Completed   Fall Risk  01/13/2015 06/28/2013  Falls in the past year? No No  Risk for fall due to : Impaired balance/gait -   Functional Status Survey:    Vitals:   12/29/15 1058  BP: 133/76  Pulse: 84  Resp: 20  Temp: 97.2 F (36.2 C)  Weight: 128 lb 9.6 oz (58.3 kg)  Height: 5' (1.524 m)  Body mass index is 25.12 kg/m. Physical Exam  Constitutional: She appears well-developed and well-nourished. No distress.  HENT:  Head: Normocephalic and atraumatic.  Right Ear: External ear normal.  Left Ear: External ear normal.  Nose: Nose normal.  Mouth/Throat: Oropharynx is clear and moist. No oropharyngeal exudate.  Eyes: Conjunctivae and EOM are normal. Pupils are equal, round, and reactive to light. Right eye exhibits no discharge. Left eye exhibits no discharge. No scleral icterus.  Neck: Normal range of motion. Neck supple. No JVD present. No tracheal deviation present. No thyromegaly present.  Cardiovascular: Normal rate, regular rhythm and intact distal pulses.   Murmur (2/6/SEM) heard. Pulmonary/Chest: Effort normal and breath sounds normal. No stridor. No respiratory distress. She has no wheezes. She has no rales.  Abdominal: Soft. Bowel sounds are normal. She exhibits no distension and no mass. There is no tenderness. There is no rebound and no guarding.  Genitourinary: Rectal exam shows guaiac negative stool.  Musculoskeletal: Normal range of motion. She exhibits no edema or tenderness.  Lymphadenopathy:    She has no cervical adenopathy.   Neurological: She is alert. She has normal reflexes. No cranial nerve deficit. She exhibits abnormal muscle tone. Coordination normal.  Skin: Skin is warm. No rash noted. She is not diaphoretic. No erythema. No pallor.  Lemon sized Lentigo to left lower cheek  Psychiatric: She has a normal mood and affect. Her affect is not blunt, not labile and not inappropriate. Her speech is not delayed, not tangential and not slurred. She is not agitated, not aggressive, not hyperactive, not slowed, not withdrawn, not actively hallucinating and not combative. Thought content is paranoid. Thought content is not delusional. Cognition and memory are impaired. She expresses inappropriate judgment. She does not express impulsivity. She expresses no homicidal and no suicidal ideation. She expresses no suicidal plans and no homicidal plans. She is communicative. She exhibits abnormal recent memory and abnormal remote memory. She is attentive.    Labs reviewed:  Recent Labs  07/22/15  NA 139  K 4.4  BUN 32*  CREATININE 1.2*    Recent Labs  07/22/15  AST 22  ALT 10  ALKPHOS 79    Recent Labs  07/22/15  WBC 10.0  HGB 13.1  HCT 38  PLT 195   Lab Results  Component Value Date   TSH 2.11 07/22/2015   No results found for: HGBA1C No results found for: CHOL, HDL, LDLCALC, LDLDIRECT, TRIG, CHOLHDL  Significant Diagnostic Results in last 30 days:  No results found.  Assessment/Plan HTN (hypertension) controlled. Continue Amlodipine 10mg , Bystolic 10mg  daily for Bp. Off Diovan/HCT. Takes Atorvastatin and ASA for cardiovascular risk reduction. 07/22/15 Na 139, K 4.4, Bun 32, creat 1.16  update CBC CMP TSH   Constipation Stable, continue MiraLax q 3 days.      Hypothyroidism Continue Levothyroxine 18mcg daily. TSH 07/22/15 2.11. Update TSH    Senile dementia stable in [facility], walking with walker a lot through out day, continue Namenda, update CBC and CMP   Osteoarthritis Mild, involved  multiple joints, continue Tylenol 500mg  bid.    Chronic renal disease 03/26/14 bun/creat 32/1.11 12/26/14 Bun/creat 38/1.1 07/22/15 Na 139, K 4.4, Bun 32, creat 1.16 Update CMP  Depression with anxiety Mood is stable, continue Lexapro     Family/ staff Communication: continue SNF [facility] for care assistance.   Labs/tests ordered:  CBC CMP TSH

## 2015-12-29 NOTE — Assessment & Plan Note (Signed)
03/26/14 bun/creat 32/1.11 12/26/14 Bun/creat 38/1.1 07/22/15 Na 139, K 4.4, Bun 32, creat 1.16 Update CMP

## 2015-12-30 DIAGNOSIS — E209 Hypoparathyroidism, unspecified: Secondary | ICD-10-CM | POA: Diagnosis not present

## 2015-12-30 DIAGNOSIS — I10 Essential (primary) hypertension: Secondary | ICD-10-CM | POA: Diagnosis not present

## 2015-12-30 LAB — BASIC METABOLIC PANEL
BUN: 33 mg/dL — AB (ref 4–21)
Creatinine: 1.1 mg/dL (ref <=1.1)
GLUCOSE: 74 mg/dL
Potassium: 3.9 mmol/L (ref 3.4–5.3)
SODIUM: 143 mmol/L (ref 137–147)

## 2015-12-30 LAB — HEPATIC FUNCTION PANEL
ALT: 8 U/L (ref 7–35)
AST: 17 U/L (ref 13–35)
Alkaline Phosphatase: 61 U/L (ref 25–125)
BILIRUBIN, TOTAL: 0.3 mg/dL

## 2015-12-30 LAB — CBC AND DIFFERENTIAL
HEMATOCRIT: 32 % — AB (ref 36–46)
Hemoglobin: 10.4 g/dL — AB (ref 12.0–16.0)
Platelets: 195 10*3/uL (ref 150–399)
WBC: 4 10*3/mL

## 2015-12-30 LAB — TSH: TSH: 1.98 u[IU]/mL (ref <=5.90)

## 2016-01-01 ENCOUNTER — Other Ambulatory Visit: Payer: Self-pay | Admitting: *Deleted

## 2016-01-26 ENCOUNTER — Non-Acute Institutional Stay (SKILLED_NURSING_FACILITY): Payer: PPO | Admitting: Nurse Practitioner

## 2016-01-26 ENCOUNTER — Encounter: Payer: Self-pay | Admitting: Nurse Practitioner

## 2016-01-26 DIAGNOSIS — N390 Urinary tract infection, site not specified: Secondary | ICD-10-CM | POA: Diagnosis not present

## 2016-01-26 DIAGNOSIS — M150 Primary generalized (osteo)arthritis: Secondary | ICD-10-CM | POA: Diagnosis not present

## 2016-01-26 DIAGNOSIS — D638 Anemia in other chronic diseases classified elsewhere: Secondary | ICD-10-CM | POA: Diagnosis not present

## 2016-01-26 DIAGNOSIS — N181 Chronic kidney disease, stage 1: Secondary | ICD-10-CM | POA: Diagnosis not present

## 2016-01-26 DIAGNOSIS — F03918 Unspecified dementia, unspecified severity, with other behavioral disturbance: Secondary | ICD-10-CM

## 2016-01-26 DIAGNOSIS — K5900 Constipation, unspecified: Secondary | ICD-10-CM

## 2016-01-26 DIAGNOSIS — K449 Diaphragmatic hernia without obstruction or gangrene: Secondary | ICD-10-CM | POA: Diagnosis not present

## 2016-01-26 DIAGNOSIS — I10 Essential (primary) hypertension: Secondary | ICD-10-CM

## 2016-01-26 DIAGNOSIS — E039 Hypothyroidism, unspecified: Secondary | ICD-10-CM

## 2016-01-26 DIAGNOSIS — M159 Polyosteoarthritis, unspecified: Secondary | ICD-10-CM

## 2016-01-26 DIAGNOSIS — F0391 Unspecified dementia with behavioral disturbance: Secondary | ICD-10-CM

## 2016-01-26 DIAGNOSIS — F418 Other specified anxiety disorders: Secondary | ICD-10-CM

## 2016-01-26 NOTE — Assessment & Plan Note (Addendum)
controlled. Continue Amlodipine 10mg , Bystolic 10mg  daily for Bp. Off Diovan/HCT. Takes Atorvastatin and ASA for cardiovascular risk reduction. 12/30/15 wbc 4.0, Hgb 10.4, plt 195, TSH 1.98, Na 143, K 3.9, Bun 33, creat 1.07

## 2016-01-26 NOTE — Assessment & Plan Note (Signed)
Asymptomatic, off acid reducer    

## 2016-01-26 NOTE — Assessment & Plan Note (Signed)
Mild, involved multiple joints, lower back, right leg, continue Tylenol 500mg bid.  

## 2016-01-26 NOTE — Progress Notes (Signed)
Location:  [facility] Room Number: 107 Place of Service:  SNF (31) Provider: Mast, Manxie  NP  Jeanmarie Hubert, MD  Patient Care Team: Estill Dooms, MD as PCP - General (Internal Medicine) Man Otho Darner, NP as Nurse Practitioner (Nurse Practitioner) Wilford Corner, MD as Consulting Physician (Gastroenterology) Sanda Klein, MD as Consulting Physician (Cardiology)  Extended Emergency Contact Information Primary Emergency Contact: [name],Pat Address: [place] [address] [place] Phone: [phone] Mobile Phone: [phone] Relation: Daughter  Code Status:  DNR Goals of care: Advanced Directive information Advanced Directives 01/26/2016  Does Patient Have a Medical Advance Directive? Yes  Type of Advance Directive Out of facility DNR (pink MOST or yellow form)  Does patient want to make changes to medical advance directive? No - Patient declined  Copy of [place] in Chart? Yes  Pre-existing out of facility DNR order (yellow form or pink MOST form) -     Chief Complaint  Patient presents with  . Acute Visit    increased behaviors    HPI:  Pt is a 82 y.o. female seen today for an acute visit for increased confusion and behaviors.   Hx of hypothyroidism, taking Levothyroxine 68mcg, last TSH 1.98 12/30/15,  HTN, controlled on Amlodipine 10mg  and Bystolic 10mg , Dementia, resides in [facility], on Namenda for memory, Depression, has increased behaviors issures, taking Lexapro 5mg  daiily.   Past Medical History:  Diagnosis Date  . Anemia, unspecified   . Anorexia   . Anxiety   . Arthritis   . Atherosclerotic cerebrovascular disease   . Backache, unspecified   . Cataract   . Cholelithiasis   . Chronic kidney disease, unspecified   . Closed fracture of right inferior pubic ramus ([place]) 11/12/2013   11/10/13 s/p fall, ED eval  X-ray R hip  1. Possible nondisplaced right inferior pubic  ramus fracture.  2. No femur fracture or dislocation   02/06/14 dc prn Motrin and Norco-not used    . Depression   . Diaphragmatic hernia without mention of obstruction or gangrene   . Diseases of lips 03/26/2013  . Diverticulosis of colon (without mention of hemorrhage)   . Hemorrhage of rectum and anus 03/26/2013  . Hyperlipidemia   . Hypertension   . Insomnia, unspecified   . Laceration of occipital scalp 11/12/2013  . Macular degeneration 06/28/2013  . Macular degeneration (senile) of retina, unspecified   . Osteoporosis   . Other malaise and fatigue   . Other sleep disturbances   . Other specified cardiac dysrhythmias(427.89)   . Other specified personal history presenting hazards to health(V15.89)   . Other symptoms involving cardiovascular system   . Pancreatitis   . Personal history of other diseases of circulatory system   . Personal history of other diseases of digestive system    pancreatitis from gallstones  . Personal history of other diseases of respiratory system   . Personal history of traumatic fracture   . Rosacea   . Senile osteoporosis    with old T11 fracture  . Thyroid disease   . Unspecified disorders of arteries and arterioles   . Unspecified hearing loss   . Unspecified hemorrhoids without mention of complication    Past Surgical History:  Procedure Laterality Date  . ABDOMINAL HYSTERECTOMY     and BSO fibroids  . COLONOSCOPY  05/23/2007  . JOINT REPLACEMENT  1996  . TOTAL  HIP ARTHROPLASTY Right     Allergies  Allergen Reactions  . Ace Inhibitors     cough  . Mirtazapine     Nightmares   . Penicillins Rash      Medication List       Accurate as of 01/26/16  9:47 PM. Always use your most recent med list.          acetaminophen 325 MG tablet Commonly known as:  TYLENOL Take 650 mg by mouth every 6 (six) hours as needed.   acetaminophen 500 MG tablet Commonly known as:  TYLENOL Take 500 mg by mouth 2 (two) times daily.   amLODipine 10  MG tablet Commonly known as:  NORVASC Take 10 mg by mouth daily.   aspirin 81 MG tablet Take 81 mg by mouth daily.   BYSTOLIC 10 MG tablet Generic drug:  nebivolol Take one tablet daily for blood pressusre   clindamycin 150 MG capsule Commonly known as:  CLEOCIN Take 4(1000) capsule by mouth 1 hr prior to dental appointment.   escitalopram 5 MG tablet Commonly known as:  LEXAPRO Take 5 mg by mouth daily.   levothyroxine 25 MCG tablet Commonly known as:  SYNTHROID, LEVOTHROID Take 25 mcg by mouth daily before breakfast.   NAMENDA XR 28 MG Cp24 24 hr capsule Generic drug:  memantine Take 28 mg by mouth.   polyethylene glycol packet Commonly known as:  MIRALAX / GLYCOLAX Take 17 g by mouth every 3 (three) days.   polyvinyl alcohol 1.4 % ophthalmic solution Commonly known as:  LIQUIFILM TEARS 1 drop as needed for dry eyes.       Review of Systems  Constitutional: Negative for chills and fever.  HENT: Positive for hearing loss. Negative for congestion, ear discharge, ear pain, nosebleeds and sore throat.   Eyes: Negative for pain, discharge and redness.  Respiratory: Negative for cough, shortness of breath and stridor.   Cardiovascular: Negative for chest pain, palpitations and leg swelling.  Gastrointestinal: Negative for abdominal pain, constipation, diarrhea, nausea and vomiting.  Genitourinary: Positive for frequency. Negative for dysuria, flank pain and urgency.  Musculoskeletal: Positive for back pain. Negative for myalgias and neck pain.       Ambulates with walker. Chronic lower back and right leg pain.   Skin: Negative for rash.       Neck and upper back itching resolved  Neurological: Negative for dizziness, tremors, seizures, weakness and headaches.  Hematological:       Hx anemia  Psychiatric/Behavioral: Positive for behavioral problems and confusion. Negative for hallucinations and suicidal ideas. The patient is nervous/anxious.     Immunization History   Administered Date(s) Administered  . DTaP 10/12/2012  . Influenza-Unspecified 11/22/2012, 12/26/2013, 11/12/2014, 12/09/2015  . PPD Test 12/06/2013  . Pneumococcal Polysaccharide-23 09/22/2000  . Td 07/08/2005  . Tdap 11/10/2013   Pertinent  Health Maintenance Due  Topic Date Due  . DEXA SCAN  09-Dec-201984  . PNA vac Low Risk Adult (2 of 2 - PCV13) 09/22/2001  . INFLUENZA VACCINE  Completed   Fall Risk  01/13/2015 06/28/2013  Falls in the past year? No No  Risk for fall due to : Impaired balance/gait -   Functional Status Survey:    Vitals:   01/26/16 1429  BP: 134/78  Pulse: 76  Resp: 20  Temp: 97 F (36.1 C)  Weight: 128 lb 9.6 oz (58.3 kg)  Height: 5' (1.524 m)   Body mass index is 25.12 kg/m. Physical Exam  Constitutional:  She appears well-developed and well-nourished. No distress.  HENT:  Head: Normocephalic and atraumatic.  Right Ear: External ear normal.  Left Ear: External ear normal.  Nose: Nose normal.  Mouth/Throat: Oropharynx is clear and moist. No oropharyngeal exudate.  Eyes: Conjunctivae and EOM are normal. Pupils are equal, round, and reactive to light. Right eye exhibits no discharge. Left eye exhibits no discharge. No scleral icterus.  Neck: Normal range of motion. Neck supple. No JVD present. No tracheal deviation present. No thyromegaly present.  Cardiovascular: Normal rate, regular rhythm and intact distal pulses.   Murmur (2/6/SEM) heard. Pulmonary/Chest: Effort normal and breath sounds normal. No stridor. No respiratory distress. She has no wheezes. She has no rales.  Abdominal: Soft. Bowel sounds are normal. She exhibits no distension and no mass. There is no tenderness. There is no rebound and no guarding.  Genitourinary: Rectal exam shows guaiac negative stool.  Musculoskeletal: Normal range of motion. She exhibits no edema or tenderness.  Lymphadenopathy:    She has no cervical adenopathy.  Neurological: She is alert. She has normal  reflexes. No cranial nerve deficit. She exhibits abnormal muscle tone. Coordination normal.  Skin: Skin is warm. No rash noted. She is not diaphoretic. No erythema. No pallor.  Lemon sized Lentigo to left lower cheek  Psychiatric: She has a normal mood and affect. Her affect is not blunt, not labile and not inappropriate. Her speech is not delayed, not tangential and not slurred. She is not agitated, not aggressive, not hyperactive, not slowed, not withdrawn, not actively hallucinating and not combative. Thought content is paranoid. Thought content is not delusional. Cognition and memory are impaired. She expresses inappropriate judgment. She does not express impulsivity. She expresses no homicidal and no suicidal ideation. She expresses no suicidal plans and no homicidal plans. She is communicative. She exhibits abnormal recent memory and abnormal remote memory. She is attentive.    Labs reviewed:  Recent Labs  07/22/15 12/30/15  NA 139 143  K 4.4 3.9  BUN 32* 33*  CREATININE 1.2* 1.1    Recent Labs  07/22/15 12/30/15  AST 22 17  ALT 10 8  ALKPHOS 79 61    Recent Labs  07/22/15 12/30/15  WBC 10.0 4.0  HGB 13.1 10.4*  HCT 38 32*  PLT 195 195   Lab Results  Component Value Date   TSH 1.98 12/30/2015   No results found for: HGBA1C No results found for: CHOL, HDL, LDLCALC, LDLDIRECT, TRIG, CHOLHDL  Significant Diagnostic Results in last 30 days:  No results found.  Assessment/Plan HTN (hypertension) controlled. Continue Amlodipine 10mg , Bystolic 10mg  daily for Bp. Off Diovan/HCT. Takes Atorvastatin and ASA for cardiovascular risk reduction. 12/30/15 wbc 4.0, Hgb 10.4, plt 195, TSH 1.98, Na 143, K 3.9, Bun 33, creat 1.07    Hiatal hernia Asymptomatic, off acid reducer.   Constipation Stable, continue MiraLax q 3 days.     Hypothyroidism Continue Levothyroxine 52mcg daily. TSH 1.98 01/09/16  Senile dementia In [facility], walking with walker a lot through out day,  continue Namenda, has increased behaviors, will UA C/s to r/o UTI, increase Lexapro to 10mg   Chronic renal disease 12/30/15 Bun 33, creat 1.07  Anemia of chronic disease 12/30/15 Hgb 10.4  Depression with anxiety Reported increased behaviors issues, will increase Lexapro to 10mg   Osteoarthritis Mild, involved multiple joints, lower back, right leg, continue Tylenol 500mg  bid.        Family/ staff Communication: SNF [facility]  Labs/tests ordered:  UA C/S

## 2016-01-26 NOTE — Assessment & Plan Note (Signed)
Reported increased behaviors issues, will increase Lexapro to 10mg 

## 2016-01-26 NOTE — Assessment & Plan Note (Signed)
In [facility], walking with walker a lot through out day, continue Namenda, has increased behaviors, will UA C/s to r/o UTI, increase Lexapro to 10mg 

## 2016-01-26 NOTE — Assessment & Plan Note (Signed)
Continue Levothyroxine 28mcg daily. TSH 1.98 01/09/16

## 2016-01-26 NOTE — Assessment & Plan Note (Signed)
12/30/15 Bun 33, creat 1.07

## 2016-01-26 NOTE — Assessment & Plan Note (Signed)
12/30/15 Hgb 10.4

## 2016-01-26 NOTE — Assessment & Plan Note (Signed)
Stable, continue MiraLax q 3 days.  

## 2016-02-02 ENCOUNTER — Encounter: Payer: Self-pay | Admitting: Nurse Practitioner

## 2016-02-02 DIAGNOSIS — N390 Urinary tract infection, site not specified: Secondary | ICD-10-CM | POA: Insufficient documentation

## 2016-02-05 ENCOUNTER — Non-Acute Institutional Stay (SKILLED_NURSING_FACILITY): Payer: PPO | Admitting: Internal Medicine

## 2016-02-05 ENCOUNTER — Encounter: Payer: Self-pay | Admitting: Internal Medicine

## 2016-02-05 DIAGNOSIS — D638 Anemia in other chronic diseases classified elsewhere: Secondary | ICD-10-CM

## 2016-02-05 DIAGNOSIS — N3000 Acute cystitis without hematuria: Secondary | ICD-10-CM | POA: Diagnosis not present

## 2016-02-05 DIAGNOSIS — F0391 Unspecified dementia with behavioral disturbance: Secondary | ICD-10-CM

## 2016-02-05 DIAGNOSIS — F03918 Unspecified dementia, unspecified severity, with other behavioral disturbance: Secondary | ICD-10-CM

## 2016-02-05 DIAGNOSIS — N181 Chronic kidney disease, stage 1: Secondary | ICD-10-CM | POA: Diagnosis not present

## 2016-02-05 DIAGNOSIS — I10 Essential (primary) hypertension: Secondary | ICD-10-CM

## 2016-02-05 NOTE — Progress Notes (Signed)
Progress Note     Location:  [facility] Room Number: N107 Place of Service:  SNF [phone]) Provider:  Jeanmarie Hubert, MD  Patient Care Team: Estill Dooms, MD as PCP - General (Internal Medicine) Man Otho Darner, NP as Nurse Practitioner (Nurse Practitioner) Wilford Corner, MD as Consulting Physician (Gastroenterology) Sanda Klein, MD as Consulting Physician (Cardiology)  Extended Emergency Contact Information Primary Emergency Contact: [name],Pat Address: [place] [address] [name] of [place] Phone: [phone] Mobile Phone: [phone] Relation: Daughter  Code Status:  DNR Goals of care: Advanced Directive information Advanced Directives 02/05/2016  Does Patient Have a Medical Advance Directive? Yes  Type of Advance Directive Out of facility DNR (pink MOST or yellow form)  Does patient want to make changes to medical advance directive? -  Copy of [place] in Chart? -  Pre-existing out of facility DNR order (yellow form or pink MOST form) Yellow form placed in chart (order not valid for inpatient use)     Chief Complaint  Patient presents with  . Medical Management of Chronic Issues    routine visit    HPI:  Pt is a 82 y.o. female seen today for medical management of chronic diseases.    Senile dementia with behavioral disturbance - unchanged  Acute cystitis without hematuria - treated with Macrobid last week  Anemia of chronic disease - stable  Stage 1 chronic kidney disease - stable  Essential hypertension - controlled     Past Medical History:  Diagnosis Date  . Anemia, unspecified   . Anorexia   . Anxiety   . Arthritis   . Atherosclerotic cerebrovascular disease   . Backache, unspecified   . Cataract   . Cholelithiasis   . Chronic kidney disease, unspecified   . Closed fracture of right inferior pubic ramus ([place]) 11/12/2013   11/10/13 s/p fall, ED eval  X-ray R hip  1.  Possible nondisplaced right inferior pubic ramus fracture.  2. No femur fracture or dislocation   02/06/14 dc prn Motrin and Norco-not used    . Depression   . Diaphragmatic hernia without mention of obstruction or gangrene   . Diseases of lips 03/26/2013  . Diverticulosis of colon (without mention of hemorrhage)   . Hemorrhage of rectum and anus 03/26/2013  . Hyperlipidemia   . Hypertension   . Insomnia, unspecified   . Laceration of occipital scalp 11/12/2013  . Macular degeneration 06/28/2013  . Macular degeneration (senile) of retina, unspecified   . Osteoporosis   . Other malaise and fatigue   . Other sleep disturbances   . Other specified cardiac dysrhythmias(427.89)   . Other specified personal history presenting hazards to health(V15.89)   . Other symptoms involving cardiovascular system   . Pancreatitis   . Personal history of other diseases of circulatory system   . Personal history of other diseases of digestive system    pancreatitis from gallstones  . Personal history of other diseases of respiratory system   . Personal history of traumatic fracture   . Rosacea   . Senile osteoporosis    with old T11 fracture  . Thyroid disease   . Unspecified disorders of arteries and arterioles   . Unspecified hearing loss   . Unspecified hemorrhoids without mention of complication    Past Surgical History:  Procedure Laterality Date  . ABDOMINAL HYSTERECTOMY     and BSO fibroids  .  COLONOSCOPY  05/23/2007  . JOINT REPLACEMENT  1996  . TOTAL HIP ARTHROPLASTY Right     Allergies  Allergen Reactions  . Ace Inhibitors     cough  . Mirtazapine     Nightmares   . Penicillins Rash      Medication List       Accurate as of 02/05/16  3:07 PM. Always use your most recent med list.          acetaminophen 325 MG tablet Commonly known as:  TYLENOL Take 650 mg by mouth every 6 (six) hours as needed.   acetaminophen 500 MG tablet Commonly known as:  TYLENOL Take 500 mg by  mouth 2 (two) times daily.   amLODipine 10 MG tablet Commonly known as:  NORVASC Take 10 mg by mouth daily.   aspirin 81 MG tablet Take 81 mg by mouth daily.   BYSTOLIC 10 MG tablet Generic drug:  nebivolol Take one tablet daily for blood pressusre   clindamycin 150 MG capsule Commonly known as:  CLEOCIN Take 4(1000) capsule by mouth 1 hr prior to dental appointment.   escitalopram 5 MG tablet Commonly known as:  LEXAPRO Take 5 mg by mouth daily.   levothyroxine 25 MCG tablet Commonly known as:  SYNTHROID, LEVOTHROID Take 25 mcg by mouth daily before breakfast.   NAMENDA XR 28 MG Cp24 24 hr capsule Generic drug:  memantine Take 28 mg by mouth.   nitrofurantoin 100 MG capsule Commonly known as:  MACRODANTIN Take 100 mg by mouth. Take one capsule twice daily for 7 days   polyethylene glycol packet Commonly known as:  MIRALAX / GLYCOLAX Take 17 g by mouth every 3 (three) days.   polyvinyl alcohol 1.4 % ophthalmic solution Commonly known as:  LIQUIFILM TEARS Place 1 drop into both eyes as needed for dry eyes.   saccharomyces boulardii 250 MG capsule Commonly known as:  FLORASTOR Take 250 mg by mouth. Take one capsule daily for 7 days       Review of Systems  Constitutional: Negative for chills and fever.  HENT: Positive for hearing loss. Negative for congestion, ear discharge, ear pain, nosebleeds and sore throat.   Eyes: Negative for pain, discharge and redness.  Respiratory: Negative for cough, shortness of breath and stridor.   Cardiovascular: Negative for chest pain, palpitations and leg swelling.  Gastrointestinal: Negative for abdominal pain, constipation, diarrhea, nausea and vomiting.  Genitourinary: Positive for frequency. Negative for dysuria, flank pain and urgency.  Musculoskeletal: Negative for back pain, myalgias and neck pain.       Ambulates with walker.   Skin: Negative for rash.       Neck and upper back itching resolved  Neurological:  Negative for dizziness, tremors, seizures, weakness and headaches.  Hematological:       Hx anemia  Psychiatric/Behavioral: Negative for hallucinations and suicidal ideas. The patient is nervous/anxious.     Immunization History  Administered Date(s) Administered  . DTaP 10/12/2012  . Influenza-Unspecified 11/22/2012, 12/26/2013, 11/12/2014, 12/09/2015  . PPD Test 12/06/2013  . Pneumococcal Polysaccharide-23 09/22/2000  . Td 07/08/2005  . Tdap 11/10/2013   Pertinent  Health Maintenance Due  Topic Date Due  . DEXA SCAN  12-Oct-201984  . PNA vac Low Risk Adult (2 of 2 - PCV13) 09/22/2001  . INFLUENZA VACCINE  Completed   Fall Risk  01/13/2015 06/28/2013  Falls in the past year? No No  Risk for fall due to : Impaired balance/gait -   Functional Status  Survey:    Vitals:   02/05/16 1453  BP: 136/78  Pulse: 78  Resp: 18  Temp: 98 F (36.7 C)  Weight: 128 lb (58.1 kg)  Height: 5' (1.524 m)   Body mass index is 25 kg/m. Physical Exam  Constitutional: She appears well-developed and well-nourished. No distress.  HENT:  Head: Normocephalic and atraumatic.  Right Ear: External ear normal.  Left Ear: External ear normal.  Nose: Nose normal.  Mouth/Throat: Oropharynx is clear and moist. No oropharyngeal exudate.  Eyes: Conjunctivae and EOM are normal. Pupils are equal, round, and reactive to light. Right eye exhibits no discharge. Left eye exhibits no discharge. No scleral icterus.  Neck: Normal range of motion. Neck supple. No JVD present. No tracheal deviation present. No thyromegaly present.  Cardiovascular: Normal rate, regular rhythm and intact distal pulses.   Murmur (2/6/SEM) heard. Pulmonary/Chest: Effort normal and breath sounds normal. No stridor. No respiratory distress. She has no wheezes. She has no rales.  Abdominal: Soft. Bowel sounds are normal. She exhibits no distension and no mass. There is no tenderness. There is no rebound and no guarding.  Genitourinary:  Rectal exam shows guaiac negative stool.  Musculoskeletal: Normal range of motion. She exhibits no edema or tenderness.  Lymphadenopathy:    She has no cervical adenopathy.  Neurological: She is alert. She has normal reflexes. No cranial nerve deficit. She exhibits abnormal muscle tone. Coordination normal.  Significant dementia. Holding doll.  Skin: Skin is warm. No rash noted. She is not diaphoretic. No erythema. No pallor.  Lemon sized Lentigo to left lower cheek  Psychiatric: She has a normal mood and affect. Her affect is not blunt, not labile and not inappropriate. Her speech is not delayed, not tangential and not slurred. She is not agitated, not aggressive, not hyperactive, not slowed, not withdrawn, not actively hallucinating and not combative. Thought content is paranoid. Thought content is not delusional. Cognition and memory are impaired. She expresses inappropriate judgment. She does not express impulsivity. She expresses no homicidal and no suicidal ideation. She expresses no suicidal plans and no homicidal plans. She is communicative. She exhibits abnormal recent memory and abnormal remote memory. She is attentive.    Labs reviewed:  Recent Labs  07/22/15 12/30/15  NA 139 143  K 4.4 3.9  BUN 32* 33*  CREATININE 1.2* 1.1    Recent Labs  07/22/15 12/30/15  AST 22 17  ALT 10 8  ALKPHOS 79 61    Recent Labs  07/22/15 12/30/15  WBC 10.0 4.0  HGB 13.1 10.4*  HCT 38 32*  PLT 195 195   Lab Results  Component Value Date   TSH 1.98 12/30/2015    Assessment/Plan  1. Senile dementia with behavioral disturbance unchanged  2. Acute cystitis without hematuria Deniers dysuria  3. Anemia of chronic disease stable  4. Stage 1 chronic kidney disease Stable  5. Essential hypertension controlled

## 2016-03-03 ENCOUNTER — Encounter: Payer: Self-pay | Admitting: Nurse Practitioner

## 2016-03-03 ENCOUNTER — Non-Acute Institutional Stay (SKILLED_NURSING_FACILITY): Payer: PPO | Admitting: Nurse Practitioner

## 2016-03-03 DIAGNOSIS — F03918 Unspecified dementia, unspecified severity, with other behavioral disturbance: Secondary | ICD-10-CM

## 2016-03-03 DIAGNOSIS — M150 Primary generalized (osteo)arthritis: Secondary | ICD-10-CM | POA: Diagnosis not present

## 2016-03-03 DIAGNOSIS — F418 Other specified anxiety disorders: Secondary | ICD-10-CM

## 2016-03-03 DIAGNOSIS — M159 Polyosteoarthritis, unspecified: Secondary | ICD-10-CM

## 2016-03-03 DIAGNOSIS — E039 Hypothyroidism, unspecified: Secondary | ICD-10-CM

## 2016-03-03 DIAGNOSIS — F0391 Unspecified dementia with behavioral disturbance: Secondary | ICD-10-CM | POA: Diagnosis not present

## 2016-03-03 DIAGNOSIS — I10 Essential (primary) hypertension: Secondary | ICD-10-CM

## 2016-03-03 DIAGNOSIS — K5900 Constipation, unspecified: Secondary | ICD-10-CM | POA: Diagnosis not present

## 2016-03-03 NOTE — Assessment & Plan Note (Signed)
Continue Levothyroxine 75mcg daily. TSH 1.98 01/09/16

## 2016-03-03 NOTE — Assessment & Plan Note (Signed)
controlled. Continue Amlodipine 10mg , Bystolic 10mg  daily for Bp. Off Diovan/HCT. Takes Atorvastatin and ASA for cardiovascular risk reduction. 12/30/15 wbc 4.0, Hgb 10.4, plt 195, TSH 1.98, Na 143, K 3.9, Bun 33, creat 1.07

## 2016-03-03 NOTE — Assessment & Plan Note (Signed)
Stable, continue MiraLax q 3 days.  

## 2016-03-03 NOTE — Progress Notes (Signed)
Location:  [facility] Room Number: 107 Place of Service:  SNF (31) Provider:  Corine Solorio, Manxie  NP  Jeanmarie Hubert, MD  Patient Care Team: Estill Dooms, MD as PCP - General (Internal Medicine) Jayro Mcmath Otho Darner, NP as Nurse Practitioner (Nurse Practitioner) Wilford Corner, MD as Consulting Physician (Gastroenterology) Sanda Klein, MD as Consulting Physician (Cardiology)  Extended Emergency Contact Information Primary Emergency Contact: [name],Pat Address: [place] [address] [name] of [place] Phone: [phone] Mobile Phone: [phone] Relation: Daughter  Code Status:  DNR Goals of care: Advanced Directive information Advanced Directives 03/03/2016  Does Patient Have a Medical Advance Directive? Yes  Type of Advance Directive Out of facility DNR (pink MOST or yellow form)  Does patient want to make changes to medical advance directive? No - Patient declined  Copy of [place] in Chart? Yes  Pre-existing out of facility DNR order (yellow form or pink MOST form) Yellow form placed in chart (order not valid for inpatient use)     Chief Complaint  Patient presents with  . Medical Management of Chronic Issues    HPI:  Pt is a 83 y.o. female seen today for medical management of chronic diseases.    Hx of hypothyroidism, taking Levothyroxine 26mcg, last TSH 1.98 12/30/15,  HTN, controlled on Amlodipine 10mg  and Bystolic 10mg , Dementia, resides in Memory Care Unit, on Namenda for memory, Depression, stable,  taking Lexapro 10 mg daiily.   Past Medical History:  Diagnosis Date  . Anemia, unspecified   . Anorexia   . Anxiety   . Arthritis   . Atherosclerotic cerebrovascular disease   . Backache, unspecified   . Cataract   . Cholelithiasis   . Chronic kidney disease, unspecified   . Closed fracture of right inferior pubic ramus ([place]) 11/12/2013   11/10/13 s/p fall, ED eval  X-ray R hip  1. Possible  nondisplaced right inferior pubic ramus fracture.  2. No femur fracture or dislocation   02/06/14 dc prn Motrin and Norco-not used    . Depression   . Diaphragmatic hernia without mention of obstruction or gangrene   . Diseases of lips 03/26/2013  . Diverticulosis of colon (without mention of hemorrhage)   . Hemorrhage of rectum and anus 03/26/2013  . Hyperlipidemia   . Hypertension   . Insomnia, unspecified   . Laceration of occipital scalp 11/12/2013  . Macular degeneration 06/28/2013  . Macular degeneration (senile) of retina, unspecified   . Osteoporosis   . Other malaise and fatigue   . Other sleep disturbances   . Other specified cardiac dysrhythmias(427.89)   . Other specified personal history presenting hazards to health(V15.89)   . Other symptoms involving cardiovascular system   . Pancreatitis   . Personal history of other diseases of circulatory system   . Personal history of other diseases of digestive system    pancreatitis from gallstones  . Personal history of other diseases of respiratory system   . Personal history of traumatic fracture   . Rosacea   . Senile osteoporosis    with old T11 fracture  . Thyroid disease   . Unspecified disorders of arteries and arterioles   . Unspecified hearing loss   . Unspecified hemorrhoids without mention of complication    Past Surgical History:  Procedure Laterality Date  . ABDOMINAL HYSTERECTOMY     and BSO fibroids  . COLONOSCOPY  05/23/2007  . JOINT  REPLACEMENT  1996  . TOTAL HIP ARTHROPLASTY Right     Allergies  Allergen Reactions  . Ace Inhibitors     cough  . Mirtazapine     Nightmares   . Penicillins Rash    Allergies as of 03/03/2016      Reactions   Ace Inhibitors    cough   Mirtazapine    Nightmares   Penicillins Rash      Medication List       Accurate as of 03/03/16  2:05 PM. Always use your most recent med list.          acetaminophen 325 MG tablet Commonly known as:  TYLENOL Take 650 mg by  mouth every 6 (six) hours as needed.   acetaminophen 500 MG tablet Commonly known as:  TYLENOL Take 500 mg by mouth 2 (two) times daily.   amLODipine 10 MG tablet Commonly known as:  NORVASC Take 10 mg by mouth daily.   aspirin 81 MG tablet Take 81 mg by mouth daily.   BYSTOLIC 10 MG tablet Generic drug:  nebivolol Take one tablet daily for blood pressusre   clindamycin 150 MG capsule Commonly known as:  CLEOCIN Take 4(1000) capsule by mouth 1 hr prior to dental appointment.   escitalopram 5 MG tablet Commonly known as:  LEXAPRO Take 5 mg by mouth daily.   levothyroxine 25 MCG tablet Commonly known as:  SYNTHROID, LEVOTHROID Take 25 mcg by mouth daily before breakfast.   NAMENDA XR 28 MG Cp24 24 hr capsule Generic drug:  memantine Take 28 mg by mouth.   oseltamivir 75 MG capsule Commonly known as:  TAMIFLU Take 75 mg by mouth daily.   polyethylene glycol packet Commonly known as:  MIRALAX / GLYCOLAX Take 17 g by mouth every 3 (three) days.   polyvinyl alcohol 1.4 % ophthalmic solution Commonly known as:  LIQUIFILM TEARS Place 1 drop into both eyes as needed for dry eyes.       Review of Systems  Constitutional: Negative for chills and fever.  HENT: Positive for hearing loss. Negative for congestion, ear discharge, ear pain, nosebleeds and sore throat.   Eyes: Negative for pain, discharge and redness.  Respiratory: Negative for cough, shortness of breath and stridor.   Cardiovascular: Negative for chest pain, palpitations and leg swelling.  Gastrointestinal: Negative for abdominal pain, constipation, diarrhea, nausea and vomiting.  Genitourinary: Positive for frequency. Negative for dysuria, flank pain and urgency.  Musculoskeletal: Negative for back pain, myalgias and neck pain.       Ambulates with walker.   Skin: Negative for rash.       Neck and upper back itching resolved  Neurological: Negative for dizziness, tremors, seizures, weakness and headaches.    Hematological:       Hx anemia  Psychiatric/Behavioral: Negative for hallucinations and suicidal ideas. The patient is nervous/anxious.     Immunization History  Administered Date(s) Administered  . DTaP 10/12/2012  . Influenza-Unspecified 11/22/2012, 12/26/2013, 11/12/2014, 12/09/2015  . PPD Test 12/06/2013  . Pneumococcal Polysaccharide-23 09/22/2000  . Td 07/08/2005  . Tdap 11/10/2013   Pertinent  Health Maintenance Due  Topic Date Due  . DEXA SCAN  July 22, 201984  . PNA vac Low Risk Adult (2 of 2 - PCV13) 09/22/2001  . INFLUENZA VACCINE  Completed   Fall Risk  01/13/2015 06/28/2013  Falls in the past year? No No  Risk for fall due to : Impaired balance/gait -   Functional Status Survey:  Vitals:   03/03/16 1209  BP: 126/62  Pulse: 68  Resp: 20  Temp: 98.2 F (36.8 C)  Weight: 128 lb (58.1 kg)  Height: 5' (1.524 m)   Body mass index is 25 kg/m. Physical Exam  Constitutional: She appears well-developed and well-nourished. No distress.  HENT:  Head: Normocephalic and atraumatic.  Right Ear: External ear normal.  Left Ear: External ear normal.  Nose: Nose normal.  Mouth/Throat: Oropharynx is clear and moist. No oropharyngeal exudate.  Eyes: Conjunctivae and EOM are normal. Pupils are equal, round, and reactive to light. Right eye exhibits no discharge. Left eye exhibits no discharge. No scleral icterus.  Neck: Normal range of motion. Neck supple. No JVD present. No tracheal deviation present. No thyromegaly present.  Cardiovascular: Normal rate, regular rhythm and intact distal pulses.   Murmur (2/6/SEM) heard. Pulmonary/Chest: Effort normal and breath sounds normal. No stridor. No respiratory distress. She has no wheezes. She has no rales.  Abdominal: Soft. Bowel sounds are normal. She exhibits no distension and no mass. There is no tenderness. There is no rebound and no guarding.  Genitourinary: Rectal exam shows guaiac negative stool.  Musculoskeletal: Normal  range of motion. She exhibits no edema or tenderness.  Lymphadenopathy:    She has no cervical adenopathy.  Neurological: She is alert. She has normal reflexes. No cranial nerve deficit. She exhibits abnormal muscle tone. Coordination normal.  Significant dementia. Holding doll.  Skin: Skin is warm. No rash noted. She is not diaphoretic. No erythema. No pallor.  Lemon sized Lentigo to left lower cheek  Psychiatric: She has a normal mood and affect. Her affect is not blunt, not labile and not inappropriate. Her speech is not delayed, not tangential and not slurred. She is not agitated, not aggressive, not hyperactive, not slowed, not withdrawn, not actively hallucinating and not combative. Thought content is paranoid. Thought content is not delusional. Cognition and memory are impaired. She expresses inappropriate judgment. She does not express impulsivity. She expresses no homicidal and no suicidal ideation. She expresses no suicidal plans and no homicidal plans. She is communicative. She exhibits abnormal recent memory and abnormal remote memory. She is attentive.    Labs reviewed:  Recent Labs  07/22/15 12/30/15  NA 139 143  K 4.4 3.9  BUN 32* 33*  CREATININE 1.2* 1.1    Recent Labs  07/22/15 12/30/15  AST 22 17  ALT 10 8  ALKPHOS 79 61    Recent Labs  07/22/15 12/30/15  WBC 10.0 4.0  HGB 13.1 10.4*  HCT 38 32*  PLT 195 195   Lab Results  Component Value Date   TSH 1.98 12/30/2015   No results found for: HGBA1C No results found for: CHOL, HDL, LDLCALC, LDLDIRECT, TRIG, CHOLHDL  Significant Diagnostic Results in last 30 days:  No results found.  Assessment/Plan HTN (hypertension) controlled. Continue Amlodipine 10mg , Bystolic 10mg  daily for Bp. Off Diovan/HCT. Takes Atorvastatin and ASA for cardiovascular risk reduction. 12/30/15 wbc 4.0, Hgb 10.4, plt 195, TSH 1.98, Na 143, K 3.9, Bun 33, creat 1.07    Constipation Stable, continue MiraLax q 3 days.      Hypothyroidism Continue Levothyroxine 84mcg daily. TSH 1.98 01/09/16  Senile dementia In [facility], walking with walker a lot through out day, continue Namenda  Osteoarthritis Mild, involved multiple joints, lower back, right leg, continue Tylenol 500mg  bid.     Depression with anxiety Stable, continue exapro10mg      Family/ staff Communication: [facility]  Labs/tests ordered:  none

## 2016-03-03 NOTE — Assessment & Plan Note (Signed)
Stable, continue exapro10mg 

## 2016-03-03 NOTE — Assessment & Plan Note (Signed)
In [facility], walking with walker a lot through out day, continue Namenda  

## 2016-03-03 NOTE — Assessment & Plan Note (Signed)
Mild, involved multiple joints, lower back, right leg, continue Tylenol 500mg bid.  

## 2016-03-31 ENCOUNTER — Non-Acute Institutional Stay (SKILLED_NURSING_FACILITY): Payer: PPO | Admitting: Nurse Practitioner

## 2016-03-31 ENCOUNTER — Encounter: Payer: Self-pay | Admitting: Nurse Practitioner

## 2016-03-31 DIAGNOSIS — F0391 Unspecified dementia with behavioral disturbance: Secondary | ICD-10-CM | POA: Diagnosis not present

## 2016-03-31 DIAGNOSIS — I10 Essential (primary) hypertension: Secondary | ICD-10-CM

## 2016-03-31 DIAGNOSIS — N181 Chronic kidney disease, stage 1: Secondary | ICD-10-CM | POA: Diagnosis not present

## 2016-03-31 DIAGNOSIS — K5900 Constipation, unspecified: Secondary | ICD-10-CM | POA: Diagnosis not present

## 2016-03-31 DIAGNOSIS — F418 Other specified anxiety disorders: Secondary | ICD-10-CM

## 2016-03-31 DIAGNOSIS — M150 Primary generalized (osteo)arthritis: Secondary | ICD-10-CM | POA: Diagnosis not present

## 2016-03-31 DIAGNOSIS — E039 Hypothyroidism, unspecified: Secondary | ICD-10-CM | POA: Diagnosis not present

## 2016-03-31 DIAGNOSIS — F03918 Unspecified dementia, unspecified severity, with other behavioral disturbance: Secondary | ICD-10-CM

## 2016-03-31 DIAGNOSIS — D638 Anemia in other chronic diseases classified elsewhere: Secondary | ICD-10-CM

## 2016-03-31 DIAGNOSIS — M159 Polyosteoarthritis, unspecified: Secondary | ICD-10-CM

## 2016-03-31 NOTE — Assessment & Plan Note (Signed)
Last Bun 33, creat 1.07 12/30/15

## 2016-03-31 NOTE — Assessment & Plan Note (Signed)
In [facility], walking with walker a lot through out day, continue Namenda  

## 2016-03-31 NOTE — Assessment & Plan Note (Signed)
Stable, continue exapro 5mg 

## 2016-03-31 NOTE — Assessment & Plan Note (Signed)
Stable, last Hgb 10.4 12/30/15

## 2016-03-31 NOTE — Assessment & Plan Note (Signed)
Mild, involved multiple joints, lower back, right leg, continue Tylenol 500mg bid.  

## 2016-03-31 NOTE — Assessment & Plan Note (Signed)
Stable, continue MiraLax q 3 days.  

## 2016-03-31 NOTE — Assessment & Plan Note (Signed)
Continue Levothyroxine 63mcg daily. TSH 1.98 01/09/16

## 2016-03-31 NOTE — Progress Notes (Signed)
Location:  [facility] Room Number: 107 Place of Service:  SNF (31) Provider:  Noah Pelaez, Manxie  NP  Jeanmarie Hubert, MD  Patient Care Team: Estill Dooms, MD as PCP - General (Internal Medicine) Prajna Vanderpool Otho Darner, NP as Nurse Practitioner (Nurse Practitioner) Wilford Corner, MD as Consulting Physician (Gastroenterology) Sanda Klein, MD as Consulting Physician (Cardiology)  Extended Emergency Contact Information Primary Emergency Contact: [name],Pat Address: [place] [address] [name] [place] Phone: [phone] Mobile Phone: [phone] Relation: Daughter  Code Status: DNR Goals of care: Advanced Directive information Advanced Directives 03/31/2016  Does Patient Have a Medical Advance Directive? Yes  Type of Advance Directive Out of facility DNR (pink MOST or yellow form)  Does patient want to make changes to medical advance directive? No - Patient declined  Copy of [place] in Chart? Yes  Pre-existing out of facility DNR order (yellow form or pink MOST form) Yellow form placed in chart (order not valid for inpatient use)     Chief Complaint  Patient presents with  . Medical Management of Chronic Issues    HPI:  Pt is a 83 y.o. female seen today for medical management of chronic diseases.    Hx of hypothyroidism, taking Levothyroxine 40mcg, last TSH 1.98 12/30/15, HTN, controlled on Amlodipine 10mg  and Bystolic 10mg , Dementia, resides in [facility], on Namenda for memory, Depression, stable,  taking Lexapro 5mg  daiily.    Past Medical History:  Diagnosis Date  . Anemia, unspecified   . Anorexia   . Anxiety   . Arthritis   . Atherosclerotic cerebrovascular disease   . Backache, unspecified   . Cataract   . Cholelithiasis   . Chronic kidney disease, unspecified   . Closed fracture of right inferior pubic ramus ([place]) 11/12/2013   11/10/13 s/p fall, ED eval  X-ray R hip  1. Possible  nondisplaced right inferior pubic ramus fracture.  2. No femur fracture or dislocation   02/06/14 dc prn Motrin and Norco-not used    . Depression   . Diaphragmatic hernia without mention of obstruction or gangrene   . Diseases of lips 03/26/2013  . Diverticulosis of colon (without mention of hemorrhage)   . Hemorrhage of rectum and anus 03/26/2013  . Hyperlipidemia   . Hypertension   . Insomnia, unspecified   . Laceration of occipital scalp 11/12/2013  . Macular degeneration 06/28/2013  . Macular degeneration (senile) of retina, unspecified   . Osteoporosis   . Other malaise and fatigue   . Other sleep disturbances   . Other specified cardiac dysrhythmias(427.89)   . Other specified personal history presenting hazards to health(V15.89)   . Other symptoms involving cardiovascular system   . Pancreatitis   . Personal history of other diseases of circulatory system   . Personal history of other diseases of digestive system    pancreatitis from gallstones  . Personal history of other diseases of respiratory system   . Personal history of traumatic fracture   . Rosacea   . Senile osteoporosis    with old T11 fracture  . Thyroid disease   . Unspecified disorders of arteries and arterioles   . Unspecified hearing loss   . Unspecified hemorrhoids without mention of complication    Past Surgical History:  Procedure Laterality Date  . ABDOMINAL HYSTERECTOMY     and BSO fibroids  . COLONOSCOPY  05/23/2007  . JOINT REPLACEMENT  1996  . TOTAL HIP ARTHROPLASTY Right     Allergies  Allergen Reactions  . Ace Inhibitors     cough  . Mirtazapine     Nightmares   . Penicillins Rash    Allergies as of 03/31/2016      Reactions   Ace Inhibitors    cough   Mirtazapine    Nightmares   Penicillins Rash      Medication List       Accurate as of 03/31/16  1:16 PM. Always use your most recent med list.          acetaminophen 325 MG tablet Commonly known as:  TYLENOL Take 650 mg by  mouth every 6 (six) hours as needed.   acetaminophen 500 MG tablet Commonly known as:  TYLENOL Take 500 mg by mouth 2 (two) times daily.   amLODipine 10 MG tablet Commonly known as:  NORVASC Take 10 mg by mouth daily.   ASPERCREME LIDOCAINE 4 % Ptch Generic drug:  Lidocaine Apply topically. Apply patch every night(8:00pm)  to lower back, and remove at (8:00 am).   aspirin 81 MG tablet Take 81 mg by mouth daily.   BYSTOLIC 10 MG tablet Generic drug:  nebivolol Take one tablet daily for blood pressusre   clindamycin 150 MG capsule Commonly known as:  CLEOCIN Take 4(1000) capsule by mouth 1 hr prior to dental appointment.   escitalopram 5 MG tablet Commonly known as:  LEXAPRO Take 5 mg by mouth daily.   levothyroxine 25 MCG tablet Commonly known as:  SYNTHROID, LEVOTHROID Take 25 mcg by mouth daily before breakfast.   NAMENDA XR 28 MG Cp24 24 hr capsule Generic drug:  memantine Take 28 mg by mouth.   polyethylene glycol packet Commonly known as:  MIRALAX / GLYCOLAX Take 17 g by mouth every 3 (three) days.   polyvinyl alcohol 1.4 % ophthalmic solution Commonly known as:  LIQUIFILM TEARS Place 1 drop into both eyes as needed for dry eyes.       Review of Systems  Constitutional: Negative for chills and fever.  HENT: Positive for hearing loss. Negative for congestion, ear discharge, ear pain, nosebleeds and sore throat.   Eyes: Negative for pain, discharge and redness.  Respiratory: Negative for cough, shortness of breath and stridor.   Cardiovascular: Negative for chest pain, palpitations and leg swelling.  Gastrointestinal: Negative for abdominal pain, constipation, diarrhea, nausea and vomiting.  Genitourinary: Positive for frequency. Negative for dysuria, flank pain and urgency.  Musculoskeletal: Negative for back pain, myalgias and neck pain.       Ambulates with walker.   Skin: Negative for rash.       Neck and upper back itching resolved  Neurological:  Negative for dizziness, tremors, seizures, weakness and headaches.  Hematological:       Hx anemia  Psychiatric/Behavioral: Negative for hallucinations and suicidal ideas. The patient is nervous/anxious.     Immunization History  Administered Date(s) Administered  . DTaP 10/12/2012  . Influenza-Unspecified 11/22/2012, 12/26/2013, 11/12/2014, 12/09/2015  . PPD Test 12/06/2013  . Pneumococcal Polysaccharide-23 09/22/2000  . Td 07/08/2005  . Tdap 11/10/2013   Pertinent  Health Maintenance Due  Topic Date Due  . DEXA SCAN  02/22/201984  . PNA vac Low Risk Adult (2 of 2 - PCV13) 09/22/2001  . INFLUENZA VACCINE  Completed   Fall Risk  01/13/2015 06/28/2013  Falls in the past year? No No  Risk for fall due to : Impaired balance/gait -  Functional Status Survey:    Vitals:   03/31/16 1019  BP: 128/64  Pulse: 68  Resp: 18  Temp: 98.3 F (36.8 C)  Weight: 131 lb 9.6 oz (59.7 kg)  Height: 5' (1.524 m)   Body mass index is 25.7 kg/m. Physical Exam  Constitutional: She appears well-developed and well-nourished. No distress.  HENT:  Head: Normocephalic and atraumatic.  Right Ear: External ear normal.  Left Ear: External ear normal.  Nose: Nose normal.  Mouth/Throat: Oropharynx is clear and moist. No oropharyngeal exudate.  Eyes: Conjunctivae and EOM are normal. Pupils are equal, round, and reactive to light. Right eye exhibits no discharge. Left eye exhibits no discharge. No scleral icterus.  Neck: Normal range of motion. Neck supple. No JVD present. No tracheal deviation present. No thyromegaly present.  Cardiovascular: Normal rate, regular rhythm and intact distal pulses.   Murmur (2/6/SEM) heard. Pulmonary/Chest: Effort normal and breath sounds normal. No stridor. No respiratory distress. She has no wheezes. She has no rales.  Abdominal: Soft. Bowel sounds are normal. She exhibits no distension and no mass. There is no tenderness. There is no rebound and no guarding.    Genitourinary: Rectal exam shows guaiac negative stool.  Musculoskeletal: Normal range of motion. She exhibits no edema or tenderness.  Lymphadenopathy:    She has no cervical adenopathy.  Neurological: She is alert. She has normal reflexes. No cranial nerve deficit. She exhibits abnormal muscle tone. Coordination normal.  Significant dementia. Holding doll.  Skin: Skin is warm. No rash noted. She is not diaphoretic. No erythema. No pallor.  Lemon sized Lentigo to left lower cheek  Psychiatric: She has a normal mood and affect. Her affect is not blunt, not labile and not inappropriate. Her speech is not delayed, not tangential and not slurred. She is not agitated, not aggressive, not hyperactive, not slowed, not withdrawn, not actively hallucinating and not combative. Thought content is paranoid. Thought content is not delusional. Cognition and memory are impaired. She expresses inappropriate judgment. She does not express impulsivity. She expresses no homicidal and no suicidal ideation. She expresses no suicidal plans and no homicidal plans. She is communicative. She exhibits abnormal recent memory and abnormal remote memory. She is attentive.    Labs reviewed:  Recent Labs  07/22/15 12/30/15  NA 139 143  K 4.4 3.9  BUN 32* 33*  CREATININE 1.2* 1.1    Recent Labs  07/22/15 12/30/15  AST 22 17  ALT 10 8  ALKPHOS 79 61    Recent Labs  07/22/15 12/30/15  WBC 10.0 4.0  HGB 13.1 10.4*  HCT 38 32*  PLT 195 195   Lab Results  Component Value Date   TSH 1.98 12/30/2015   No results found for: HGBA1C No results found for: CHOL, HDL, LDLCALC, LDLDIRECT, TRIG, CHOLHDL  Significant Diagnostic Results in last 30 days:  No results found.  Assessment/Plan HTN (hypertension) controlled. Continue Amlodipine 10mg , Bystolic 10mg  daily for Bp.Takes Atorvastatin and ASA for cardiovascular risk reduction.   Constipation Stable, continue MiraLax q 3 days.       Hypothyroidism Continue Levothyroxine 47mcg daily. TSH 1.98 01/09/16   Senile dementia In [facility], walking with walker a lot through out day, continue Namenda  Osteoarthritis Mild, involved multiple joints, lower back, right leg, continue Tylenol 500mg  bid.   Chronic renal disease Last Bun 33, creat 1.07 12/30/15  Anemia of chronic disease Stable, last Hgb 10.4 12/30/15  Depression with anxiety Stable, continue exapro 5mg      Family/  staff Communication: [facility]  Labs/tests ordered:  none

## 2016-03-31 NOTE — Assessment & Plan Note (Signed)
controlled. Continue Amlodipine 10mg , Bystolic 10mg  daily for Bp.Takes Atorvastatin and ASA for cardiovascular risk reduction.

## 2016-04-26 ENCOUNTER — Encounter: Payer: Self-pay | Admitting: Nurse Practitioner

## 2016-04-26 ENCOUNTER — Non-Acute Institutional Stay (SKILLED_NURSING_FACILITY): Payer: PPO | Admitting: Nurse Practitioner

## 2016-04-26 DIAGNOSIS — F0391 Unspecified dementia with behavioral disturbance: Secondary | ICD-10-CM

## 2016-04-26 DIAGNOSIS — K5900 Constipation, unspecified: Secondary | ICD-10-CM

## 2016-04-26 DIAGNOSIS — E039 Hypothyroidism, unspecified: Secondary | ICD-10-CM

## 2016-04-26 DIAGNOSIS — M159 Polyosteoarthritis, unspecified: Secondary | ICD-10-CM

## 2016-04-26 DIAGNOSIS — I10 Essential (primary) hypertension: Secondary | ICD-10-CM

## 2016-04-26 DIAGNOSIS — M150 Primary generalized (osteo)arthritis: Secondary | ICD-10-CM | POA: Diagnosis not present

## 2016-04-26 DIAGNOSIS — F418 Other specified anxiety disorders: Secondary | ICD-10-CM

## 2016-04-26 DIAGNOSIS — F03918 Unspecified dementia, unspecified severity, with other behavioral disturbance: Secondary | ICD-10-CM

## 2016-04-26 NOTE — Assessment & Plan Note (Signed)
Mild, involved multiple joints, lower back, right leg, continue Tylenol 500mg bid.  

## 2016-04-26 NOTE — Progress Notes (Signed)
Location:  [facility] Room Number: 107 Place of Service:  SNF (31) Provider:  Fusako Tanabe, Manxie  NP  Jeanmarie Hubert, MD  Patient Care Team: Estill Dooms, MD as PCP - General (Internal Medicine) Acxel Dingee Otho Darner, NP as Nurse Practitioner (Nurse Practitioner) Wilford Corner, MD as Consulting Physician (Gastroenterology) Sanda Klein, MD as Consulting Physician (Cardiology)  Extended Emergency Contact Information Primary Emergency Contact: [name],Pat Address: [place] [address] [name] of [place] Phone: [phone] Mobile Phone: [phone] Relation: Daughter  Code Status:  Goals of care: Advanced Directive information Advanced Directives 04/26/2016  Does Patient Have a Medical Advance Directive? Yes  Type of Advance Directive Out of facility DNR (pink MOST or yellow form)  Does patient want to make changes to medical advance directive? No - Patient declined  Copy of [place] in Chart? Yes  Pre-existing out of facility DNR order (yellow form or pink MOST form) Yellow form placed in chart (order not valid for inpatient use)     Chief Complaint  Patient presents with  . Medical Management of Chronic Issues    HPI:  Pt is a 83 y.o. female seen today for medical management of chronic diseases.      Hx of hypothyroidism, taking Levothyroxine 65mcg, last TSH 1.98 12/30/15, HTN, controlled on Amlodipine 10mg  and Bystolic 10mg , Dementia, resides in [facility], on Namenda for memory, Depression, stable, taking Lexapro 5mg  daiily.  Past Medical History:  Diagnosis Date  . Anemia, unspecified   . Anorexia   . Anxiety   . Arthritis   . Atherosclerotic cerebrovascular disease   . Backache, unspecified   . Cataract   . Cholelithiasis   . Chronic kidney disease, unspecified   . Closed fracture of right inferior pubic ramus ([place]) 11/12/2013   11/10/13 s/p fall, ED eval  X-ray R hip  1. Possible  nondisplaced right inferior pubic ramus fracture.  2. No femur fracture or dislocation   02/06/14 dc prn Motrin and Norco-not used    . Depression   . Diaphragmatic hernia without mention of obstruction or gangrene   . Diseases of lips 03/26/2013  . Diverticulosis of colon (without mention of hemorrhage)   . Hemorrhage of rectum and anus 03/26/2013  . Hyperlipidemia   . Hypertension   . Insomnia, unspecified   . Laceration of occipital scalp 11/12/2013  . Macular degeneration 06/28/2013  . Macular degeneration (senile) of retina, unspecified   . Osteoporosis   . Other malaise and fatigue   . Other sleep disturbances   . Other specified cardiac dysrhythmias(427.89)   . Other specified personal history presenting hazards to health(V15.89)   . Other symptoms involving cardiovascular system   . Pancreatitis   . Personal history of other diseases of circulatory system   . Personal history of other diseases of digestive system    pancreatitis from gallstones  . Personal history of other diseases of respiratory system   . Personal history of traumatic fracture   . Rosacea   . Senile osteoporosis    with old T11 fracture  . Thyroid disease   . Unspecified disorders of arteries and arterioles   . Unspecified hearing loss   . Unspecified hemorrhoids without mention of complication    Past Surgical History:  Procedure Laterality Date  . ABDOMINAL HYSTERECTOMY     and BSO fibroids  . COLONOSCOPY  05/23/2007  . JOINT REPLACEMENT  1996  .  TOTAL HIP ARTHROPLASTY Right     Allergies  Allergen Reactions  . Ace Inhibitors     cough  . Mirtazapine     Nightmares   . Penicillins Rash    Allergies as of 04/26/2016      Reactions   Ace Inhibitors    cough   Mirtazapine    Nightmares   Penicillins Rash      Medication List       Accurate as of 04/26/16  1:20 PM. Always use your most recent med list.          acetaminophen 325 MG tablet Commonly known as:  TYLENOL Take 650 mg by  mouth every 6 (six) hours as needed.   acetaminophen 500 MG tablet Commonly known as:  TYLENOL Take 500 mg by mouth 2 (two) times daily.   amLODipine 10 MG tablet Commonly known as:  NORVASC Take 10 mg by mouth daily.   ASPERCREME LIDOCAINE 4 % Ptch Generic drug:  Lidocaine Apply topically. Apply patch every night(8:00pm)  to lower back, and remove at (8:00 am).   aspirin 81 MG tablet Take 81 mg by mouth daily.   BYSTOLIC 10 MG tablet Generic drug:  nebivolol Take one tablet daily for blood pressusre   clindamycin 150 MG capsule Commonly known as:  CLEOCIN Take 4(1000) capsule by mouth 1 hr prior to dental appointment.   escitalopram 5 MG tablet Commonly known as:  LEXAPRO Take 5 mg by mouth daily.   levothyroxine 25 MCG tablet Commonly known as:  SYNTHROID, LEVOTHROID Take 25 mcg by mouth daily before breakfast.   NAMENDA XR 28 MG Cp24 24 hr capsule Generic drug:  memantine Take 28 mg by mouth.   polyethylene glycol packet Commonly known as:  MIRALAX / GLYCOLAX Take 17 g by mouth every 3 (three) days.   polyvinyl alcohol 1.4 % ophthalmic solution Commonly known as:  LIQUIFILM TEARS Place 1 drop into both eyes as needed for dry eyes.       Review of Systems  Constitutional: Negative for chills and fever.  HENT: Positive for hearing loss. Negative for congestion, ear discharge, ear pain, nosebleeds and sore throat.   Eyes: Negative for pain, discharge and redness.  Respiratory: Negative for cough, shortness of breath and stridor.   Cardiovascular: Negative for chest pain, palpitations and leg swelling.  Gastrointestinal: Negative for abdominal pain, constipation, diarrhea, nausea and vomiting.  Genitourinary: Positive for frequency. Negative for dysuria, flank pain and urgency.  Musculoskeletal: Negative for back pain, myalgias and neck pain.       Ambulates with walker.   Skin: Negative for rash.  Neurological: Negative for dizziness, tremors, seizures,  weakness and headaches.  Hematological:       Hx anemia  Psychiatric/Behavioral: Negative for hallucinations and suicidal ideas. The patient is nervous/anxious.     Immunization History  Administered Date(s) Administered  . DTaP 10/12/2012  . Influenza-Unspecified 11/22/2012, 12/26/2013, 11/12/2014, 12/09/2015  . PPD Test 12/06/2013  . Pneumococcal Polysaccharide-23 09/22/2000  . Td 07/08/2005  . Tdap 11/10/2013   Pertinent  Health Maintenance Due  Topic Date Due  . DEXA SCAN  01/22/201984  . PNA vac Low Risk Adult (2 of 2 - PCV13) 09/22/2001  . INFLUENZA VACCINE  Completed   Fall Risk  01/13/2015 06/28/2013  Falls in the past year? No No  Risk for fall due to : Impaired balance/gait -   Functional Status Survey:    Vitals:   04/26/16 1115  BP: 126/70  Pulse: 70  Resp: 18  Temp: 98.2 F (36.8 C)  Weight: 125 lb 6.4 oz (56.9 kg)  Height: 5' (1.524 m)   Body mass index is 24.49 kg/m. Physical Exam  Constitutional: She appears well-developed and well-nourished. No distress.  HENT:  Head: Normocephalic and atraumatic.  Right Ear: External ear normal.  Left Ear: External ear normal.  Nose: Nose normal.  Mouth/Throat: Oropharynx is clear and moist. No oropharyngeal exudate.  Eyes: Conjunctivae and EOM are normal. Pupils are equal, round, and reactive to light. Right eye exhibits no discharge. Left eye exhibits no discharge. No scleral icterus.  Neck: Normal range of motion. Neck supple. No JVD present. No tracheal deviation present. No thyromegaly present.  Cardiovascular: Normal rate, regular rhythm and intact distal pulses.   Murmur (2/6/SEM) heard. Pulmonary/Chest: Effort normal and breath sounds normal. No stridor. No respiratory distress. She has no wheezes. She has no rales.  Abdominal: Soft. Bowel sounds are normal. She exhibits no distension and no mass. There is no tenderness. There is no rebound and no guarding.  Genitourinary: Rectal exam shows guaiac negative  stool.  Musculoskeletal: Normal range of motion. She exhibits no edema or tenderness.  Lymphadenopathy:    She has no cervical adenopathy.  Neurological: She is alert. She has normal reflexes. No cranial nerve deficit. She exhibits abnormal muscle tone. Coordination normal.  Significant dementia. Holding doll.  Skin: Skin is warm. No rash noted. She is not diaphoretic. No erythema. No pallor.  Lemon sized Lentigo to left lower cheek  Psychiatric: She has a normal mood and affect. Her affect is not blunt, not labile and not inappropriate. Her speech is not delayed, not tangential and not slurred. She is not agitated, not aggressive, not hyperactive, not slowed, not withdrawn, not actively hallucinating and not combative. Thought content is paranoid. Thought content is not delusional. Cognition and memory are impaired. She expresses inappropriate judgment. She does not express impulsivity. She expresses no homicidal and no suicidal ideation. She expresses no suicidal plans and no homicidal plans. She is communicative. She exhibits abnormal recent memory and abnormal remote memory. She is attentive.    Labs reviewed:  Recent Labs  07/22/15 12/30/15  NA 139 143  K 4.4 3.9  BUN 32* 33*  CREATININE 1.2* 1.1    Recent Labs  07/22/15 12/30/15  AST 22 17  ALT 10 8  ALKPHOS 79 61    Recent Labs  07/22/15 12/30/15  WBC 10.0 4.0  HGB 13.1 10.4*  HCT 38 32*  PLT 195 195   Lab Results  Component Value Date   TSH 1.98 12/30/2015   No results found for: HGBA1C No results found for: CHOL, HDL, LDLCALC, LDLDIRECT, TRIG, CHOLHDL  Significant Diagnostic Results in last 30 days:  No results found.  Assessment/Plan Constipation Stable, continue MiraLax q 3 days.     Osteoarthritis Mild, involved multiple joints, lower back, right leg, continue Tylenol 500mg  bid.   HTN (hypertension) controlled. Continue Amlodipine 10mg , Bystolic 10mg  daily for Bp.Takes ASA for cardiovascular risk  reduction.   Hypothyroidism Continue Levothyroxine 74mcg daily. TSH 1.98 01/09/16  Senile dementia In [facility], walking with walker a lot through out day, continue Namenda   Depression with anxiety Stable, continue Lexapro 5mg      Family/ staff Communication: [facility]  Labs/tests ordered:  none

## 2016-04-26 NOTE — Assessment & Plan Note (Addendum)
controlled. Continue Amlodipine 10mg , Bystolic 10mg  daily for Bp.Takes ASA for cardiovascular risk reduction.

## 2016-04-26 NOTE — Assessment & Plan Note (Signed)
Stable, continue MiraLax q 3 days.  

## 2016-04-26 NOTE — Assessment & Plan Note (Signed)
Stable, continue Lexapro 5mg 

## 2016-04-26 NOTE — Assessment & Plan Note (Signed)
In [facility], walking with walker a lot through out day, continue Namenda  

## 2016-04-26 NOTE — Assessment & Plan Note (Signed)
Continue Levothyroxine 26mcg daily. TSH 1.98 01/09/16

## 2016-05-24 ENCOUNTER — Non-Acute Institutional Stay (SKILLED_NURSING_FACILITY): Payer: PPO | Admitting: Internal Medicine

## 2016-05-24 ENCOUNTER — Encounter: Payer: Self-pay | Admitting: Internal Medicine

## 2016-05-24 DIAGNOSIS — I10 Essential (primary) hypertension: Secondary | ICD-10-CM | POA: Diagnosis not present

## 2016-05-24 DIAGNOSIS — F03918 Unspecified dementia, unspecified severity, with other behavioral disturbance: Secondary | ICD-10-CM

## 2016-05-24 DIAGNOSIS — N181 Chronic kidney disease, stage 1: Secondary | ICD-10-CM | POA: Diagnosis not present

## 2016-05-24 DIAGNOSIS — E039 Hypothyroidism, unspecified: Secondary | ICD-10-CM | POA: Diagnosis not present

## 2016-05-24 DIAGNOSIS — D638 Anemia in other chronic diseases classified elsewhere: Secondary | ICD-10-CM

## 2016-05-24 DIAGNOSIS — F0391 Unspecified dementia with behavioral disturbance: Secondary | ICD-10-CM

## 2016-05-24 NOTE — Progress Notes (Signed)
Progress Note    Location:  [facility] Room Number: N107 Place of Service:  SNF [phone]) Provider:  Jeanmarie Hubert, MD  Patient Care Team: Estill Dooms, MD as PCP - General (Internal Medicine) Man Otho Darner, NP as Nurse Practitioner (Nurse Practitioner) Wilford Corner, MD as Consulting Physician (Gastroenterology) Sanda Klein, MD as Consulting Physician (Cardiology)  Extended Emergency Contact Information Primary Emergency Contact: [name],Pat Address: [place] [address] [name] of [place] Phone: [phone] Mobile Phone: [phone] Relation: Daughter  Code Status:  DNR Goals of care: Advanced Directive information Advanced Directives 05/24/2016  Does Patient Have a Medical Advance Directive? Yes  Type of Paramedic of [place];Out of facility DNR (pink MOST or yellow form)  Does patient want to make changes to medical advance directive? -  Copy of [place] in Chart? Yes  Pre-existing out of facility DNR order (yellow form or pink MOST form) Yellow form placed in chart (order not valid for inpatient use)     Chief Complaint  Patient presents with  . Medical Management of Chronic Issues    routine visit    HPI:  Pt is a 83 y.o. female seen today for medical management of chronic diseases.    Senile dementia with behavioral disturbance - unchan ged  Essential hypertension - controlled  Stage 1 chronic kidney disease - stable  Anemia of chronic disease - unchanged  Hypothyroidism, unspecified type - compensated     Past Medical History:  Diagnosis Date  . Anemia, unspecified   . Anorexia   . Anxiety   . Arthritis   . Atherosclerotic cerebrovascular disease   . Backache, unspecified   . Cataract   . Cholelithiasis   . Chronic kidney disease, unspecified   . Closed fracture of right inferior pubic ramus ([place]) 11/12/2013   11/10/13 s/p fall, ED eval  X-ray  R hip  1. Possible nondisplaced right inferior pubic ramus fracture.  2. No femur fracture or dislocation   02/06/14 dc prn Motrin and Norco-not used    . Depression   . Diaphragmatic hernia without mention of obstruction or gangrene   . Diseases of lips 03/26/2013  . Diverticulosis of colon (without mention of hemorrhage)   . Hemorrhage of rectum and anus 03/26/2013  . Hyperlipidemia   . Hypertension   . Insomnia, unspecified   . Laceration of occipital scalp 11/12/2013  . Macular degeneration 06/28/2013  . Macular degeneration (senile) of retina, unspecified   . Osteoporosis   . Other malaise and fatigue   . Other sleep disturbances   . Other specified cardiac dysrhythmias(427.89)   . Other specified personal history presenting hazards to health(V15.89)   . Other symptoms involving cardiovascular system   . Pancreatitis   . Personal history of other diseases of circulatory system   . Personal history of other diseases of digestive system    pancreatitis from gallstones  . Personal history of other diseases of respiratory system   . Personal history of traumatic fracture   . Rosacea   . Senile osteoporosis    with old T11 fracture  . Thyroid disease   . Unspecified disorders of arteries and arterioles   . Unspecified hearing loss   . Unspecified hemorrhoids without mention of complication    Past Surgical History:  Procedure Laterality Date  . ABDOMINAL HYSTERECTOMY     and BSO fibroids  . COLONOSCOPY  05/23/2007  . JOINT REPLACEMENT  1996  . TOTAL HIP ARTHROPLASTY Right     Allergies  Allergen Reactions  . Ace Inhibitors     cough  . Mirtazapine     Nightmares   . Penicillins Rash    Allergies as of 05/24/2016      Reactions   Ace Inhibitors    cough   Mirtazapine    Nightmares   Penicillins Rash      Medication List       Accurate as of 05/24/16  2:21 PM. Always use your most recent med list.          acetaminophen 325 MG tablet Commonly known as:   TYLENOL Take 650 mg by mouth every 6 (six) hours as needed.   acetaminophen 500 MG tablet Commonly known as:  TYLENOL Take 500 mg by mouth 2 (two) times daily.   amLODipine 10 MG tablet Commonly known as:  NORVASC Take 10 mg by mouth daily.   ASPERCREME LIDOCAINE 4 % Ptch Generic drug:  Lidocaine Apply topically. Apply patch every night(8:00pm)  to lower back, and remove at (8:00 am).   aspirin 81 MG tablet Take 81 mg by mouth daily.   BYSTOLIC 10 MG tablet Generic drug:  nebivolol Take one tablet daily for blood pressusre   clindamycin 150 MG capsule Commonly known as:  CLEOCIN Take 4(1000) capsule by mouth 1 hr prior to dental appointment.   escitalopram 5 MG tablet Commonly known as:  LEXAPRO Take 5 mg by mouth daily.   levothyroxine 25 MCG tablet Commonly known as:  SYNTHROID, LEVOTHROID Take 25 mcg by mouth daily before breakfast.   NAMENDA XR 28 MG Cp24 24 hr capsule Generic drug:  memantine Take 28 mg by mouth.   polyethylene glycol packet Commonly known as:  MIRALAX / GLYCOLAX Take 17 g by mouth every 3 (three) days.   polyvinyl alcohol 1.4 % ophthalmic solution Commonly known as:  LIQUIFILM TEARS Place 1 drop into both eyes. One drop in both eye four times daily       Review of Systems  Constitutional: Negative for chills and fever.  HENT: Positive for hearing loss. Negative for congestion, ear discharge, ear pain, nosebleeds and sore throat.   Eyes: Negative for pain, discharge and redness.  Respiratory: Negative for cough, shortness of breath and stridor.   Cardiovascular: Negative for chest pain, palpitations and leg swelling.  Gastrointestinal: Negative for abdominal pain, constipation, diarrhea, nausea and vomiting.  Endocrine:       Hypothyroid  Genitourinary: Positive for frequency. Negative for dysuria, flank pain and urgency.  Musculoskeletal: Negative for back pain, myalgias and neck pain.       Ambulates with walker.   Skin: Negative  for rash.  Neurological: Negative for dizziness, tremors, seizures, weakness and headaches.  Hematological:       Hx anemia  Psychiatric/Behavioral: Negative for agitation, hallucinations and suicidal ideas. The patient is nervous/anxious.     Immunization History  Administered Date(s) Administered  . DTaP 10/12/2012  . Influenza-Unspecified 11/22/2012, 12/26/2013, 11/12/2014, 12/09/2015  . PPD Test 12/06/2013  . Pneumococcal Polysaccharide-23 09/22/2000  . Td 07/08/2005  . Tdap 11/10/2013   Pertinent  Health Maintenance Due  Topic Date Due  . DEXA SCAN  09-01-1982  . PNA vac Low Risk Adult (2 of 2 - PCV13) 09/22/2001  . INFLUENZA VACCINE  09/22/2016   Fall Risk  01/13/2015 06/28/2013  Falls in the past year? No No  Risk for fall due  to : Impaired balance/gait -     Vitals:   05/24/16 1415  BP: 126/70  Pulse: 70  Resp: 18  Temp: 98 F (36.7 C)  Weight: 125 lb 6.4 oz (56.9 kg)  Height: 5' (1.524 m)   Body mass index is 24.49 kg/m. Physical Exam  Constitutional: She appears well-developed and well-nourished. No distress.  HENT:  Head: Normocephalic and atraumatic.  Right Ear: External ear normal.  Left Ear: External ear normal.  Nose: Nose normal.  Mouth/Throat: Oropharynx is clear and moist. No oropharyngeal exudate.  Eyes: Conjunctivae and EOM are normal. Pupils are equal, round, and reactive to light. Right eye exhibits no discharge. Left eye exhibits no discharge. No scleral icterus.  Neck: Normal range of motion. Neck supple. No JVD present. No tracheal deviation present. No thyromegaly present.  Cardiovascular: Normal rate, regular rhythm and intact distal pulses.   Murmur (2/6/SEM) heard. Pulmonary/Chest: Effort normal and breath sounds normal. No stridor. No respiratory distress. She has no wheezes. She has no rales.  Abdominal: Soft. Bowel sounds are normal. She exhibits no distension and no mass. There is no tenderness. There is no rebound and no guarding.   Genitourinary: Rectal exam shows guaiac negative stool.  Musculoskeletal: Normal range of motion. She exhibits no edema or tenderness.  Lymphadenopathy:    She has no cervical adenopathy.  Neurological: She is alert. She has normal reflexes. No cranial nerve deficit. She exhibits abnormal muscle tone. Coordination normal.  Significant dementia. Holding doll.  Skin: Skin is warm. No rash noted. She is not diaphoretic. No erythema. No pallor.  Lemon sized Lentigo to left lower cheek  Psychiatric: She has a normal mood and affect. Her affect is not blunt, not labile and not inappropriate. Her speech is not delayed, not tangential and not slurred. She is not agitated, not aggressive, not hyperactive, not slowed, not withdrawn, not actively hallucinating and not combative. Thought content is paranoid. Thought content is not delusional. Cognition and memory are impaired. She expresses inappropriate judgment. She does not express impulsivity. She expresses no homicidal and no suicidal ideation. She expresses no suicidal plans and no homicidal plans. She is communicative. She exhibits abnormal recent memory and abnormal remote memory. She is attentive.    Labs reviewed:  Recent Labs  07/22/15 12/30/15  NA 139 143  K 4.4 3.9  BUN 32* 33*  CREATININE 1.2* 1.1    Recent Labs  07/22/15 12/30/15  AST 22 17  ALT 10 8  ALKPHOS 79 61    Recent Labs  07/22/15 12/30/15  WBC 10.0 4.0  HGB 13.1 10.4*  HCT 38 32*  PLT 195 195   Lab Results  Component Value Date   TSH 1.98 12/30/2015    Assessment/Plan 1. Senile dementia with behavioral disturbance unchanged  2. Essential hypertension -BVMP  3. Stage 1 chronic kidney disease Stable -BMP  4. Anemia of chronic disease -CBC  5. Hypothyroidism, unspecified type -TSH

## 2016-06-01 DIAGNOSIS — D638 Anemia in other chronic diseases classified elsewhere: Secondary | ICD-10-CM | POA: Diagnosis not present

## 2016-06-01 DIAGNOSIS — N189 Chronic kidney disease, unspecified: Secondary | ICD-10-CM | POA: Diagnosis not present

## 2016-06-01 DIAGNOSIS — E039 Hypothyroidism, unspecified: Secondary | ICD-10-CM | POA: Diagnosis not present

## 2016-06-01 LAB — CBC AND DIFFERENTIAL
HCT: 35 % — AB (ref 36–46)
Hemoglobin: 11.7 g/dL — AB (ref 12.0–16.0)
PLATELETS: 219 10*3/uL (ref 150–399)
WBC: 4.7 10^3/mL

## 2016-06-01 LAB — BASIC METABOLIC PANEL
BUN: 31 mg/dL — AB (ref 4–21)
CREATININE: 1.2 mg/dL — AB (ref <=1.1)
Glucose: 81 mg/dL
Potassium: 4.5 mmol/L (ref 3.4–5.3)
Sodium: 141 mmol/L (ref 137–147)

## 2016-06-01 LAB — TSH: TSH: 1.68 u[IU]/mL (ref <=5.90)

## 2016-06-03 ENCOUNTER — Other Ambulatory Visit: Payer: Self-pay | Admitting: *Deleted

## 2016-06-09 ENCOUNTER — Encounter: Payer: Self-pay | Admitting: Nurse Practitioner

## 2016-06-09 ENCOUNTER — Non-Acute Institutional Stay (SKILLED_NURSING_FACILITY): Payer: PPO | Admitting: Nurse Practitioner

## 2016-06-09 DIAGNOSIS — G4700 Insomnia, unspecified: Secondary | ICD-10-CM

## 2016-06-09 DIAGNOSIS — E039 Hypothyroidism, unspecified: Secondary | ICD-10-CM | POA: Diagnosis not present

## 2016-06-09 DIAGNOSIS — M159 Polyosteoarthritis, unspecified: Secondary | ICD-10-CM

## 2016-06-09 DIAGNOSIS — K449 Diaphragmatic hernia without obstruction or gangrene: Secondary | ICD-10-CM | POA: Diagnosis not present

## 2016-06-09 DIAGNOSIS — M150 Primary generalized (osteo)arthritis: Secondary | ICD-10-CM

## 2016-06-09 DIAGNOSIS — F0391 Unspecified dementia with behavioral disturbance: Secondary | ICD-10-CM

## 2016-06-09 DIAGNOSIS — F418 Other specified anxiety disorders: Secondary | ICD-10-CM

## 2016-06-09 DIAGNOSIS — D638 Anemia in other chronic diseases classified elsewhere: Secondary | ICD-10-CM | POA: Diagnosis not present

## 2016-06-09 DIAGNOSIS — F03918 Unspecified dementia, unspecified severity, with other behavioral disturbance: Secondary | ICD-10-CM

## 2016-06-09 DIAGNOSIS — R1111 Vomiting without nausea: Secondary | ICD-10-CM

## 2016-06-09 DIAGNOSIS — N181 Chronic kidney disease, stage 1: Secondary | ICD-10-CM

## 2016-06-09 DIAGNOSIS — R1110 Vomiting, unspecified: Secondary | ICD-10-CM | POA: Insufficient documentation

## 2016-06-09 DIAGNOSIS — K5900 Constipation, unspecified: Secondary | ICD-10-CM | POA: Diagnosis not present

## 2016-06-09 DIAGNOSIS — I10 Essential (primary) hypertension: Secondary | ICD-10-CM | POA: Diagnosis not present

## 2016-06-09 NOTE — Assessment & Plan Note (Signed)
Stable, continue MiraLax q 3 days.  

## 2016-06-09 NOTE — Assessment & Plan Note (Signed)
Stable, off acid reducer 

## 2016-06-09 NOTE — Assessment & Plan Note (Signed)
In MCU, walking with walker a lot through out day, continue Namenda

## 2016-06-09 NOTE — Assessment & Plan Note (Signed)
Continue Levothyroxine 80mcg daily. TSH 1.68 06/01/16

## 2016-06-09 NOTE — Assessment & Plan Note (Signed)
06/01/16 creat 1.15

## 2016-06-09 NOTE — Assessment & Plan Note (Signed)
Mild, involved multiple joints, lower back, right leg, continue Tylenol 500mg  bid.

## 2016-06-09 NOTE — Assessment & Plan Note (Signed)
Sleeps well at night, continue Lexapro.

## 2016-06-09 NOTE — Assessment & Plan Note (Signed)
Stable, continue Lexapro 5mg 

## 2016-06-09 NOTE — Assessment & Plan Note (Addendum)
controlled. Continue Amlodipine 10mg , Bystolic 10mg  daily for Bp.Takes ASA for cardiovascular risk reduction. 06/01/16 Na 141, K 4.5, Bun 31, creat 1.15, wbc 4.7, Hgb 11.7, plt 219, TSH 1.68

## 2016-06-09 NOTE — Assessment & Plan Note (Addendum)
Continue to observe the patient, may use standing order for symptomatic management, may consider UA C/S, CBC, CMP, lipase, amylase, [place] abd  If symoptins persists

## 2016-06-09 NOTE — Progress Notes (Signed)
Location:  [facility] Room Number: 107 Place of Service:  SNF (31) Provider:  Rajeev Escue, Manxie  NP  Jeanmarie Hubert, MD  Patient Care Team: Estill Dooms, MD as PCP - General (Internal Medicine) Angelina Venard Otho Darner, NP as Nurse Practitioner (Nurse Practitioner) Wilford Corner, MD as Consulting Physician (Gastroenterology) Sanda Klein, MD as Consulting Physician (Cardiology)  Extended Emergency Contact Information Primary Emergency Contact: [name],Pat Address: [place] [address] [name] of [place] Phone: [phone] Mobile Phone: [phone] Relation: Daughter  Code Status:  DNR Goals of care: Advanced Directive information Advanced Directives 06/09/2016  Does Patient Have a Medical Advance Directive? Yes  Type of Paramedic of [place];Out of facility DNR (pink MOST or yellow form)  Does patient want to make changes to medical advance directive? No - Patient declined  Copy of [place] in Chart? Yes  Pre-existing out of facility DNR order (yellow form or pink MOST form) Yellow form placed in chart (order not valid for inpatient use)     Chief Complaint  Patient presents with  . Acute Visit    vomiting x 1 today.    HPI:  Pt is a 83 y.o. female seen today for an acute visit for vomited x 1, denied nausea, constipation, diarrhea, abd pain, chest pain, palpitation, she is afebrile, no O2 desaturation.   Hx of hypothyroidism, taking Levothyroxine 85mcg, last TSH 1.68 06/01/16.HTN, controlled on Amlodipine 10mg  and Bystolic 10mg , Dementia, resides in Memory Care Unit, on Namenda for memory, Depression, stable, taking Lexapro 5mg  daiily.   Past Medical History:  Diagnosis Date  . Anemia, unspecified   . Anorexia   . Anxiety   . Arthritis   . Atherosclerotic cerebrovascular disease   . Backache, unspecified   . Cataract   . Cholelithiasis   . Chronic kidney disease, unspecified    . Closed fracture of right inferior pubic ramus ([place]) 11/12/2013   11/10/13 s/p fall, ED eval  X-ray R hip  1. Possible nondisplaced right inferior pubic ramus fracture.  2. No femur fracture or dislocation   02/06/14 dc prn Motrin and Norco-not used    . Depression   . Diaphragmatic hernia without mention of obstruction or gangrene   . Diseases of lips 03/26/2013  . Diverticulosis of colon (without mention of hemorrhage)   . Hemorrhage of rectum and anus 03/26/2013  . Hyperlipidemia   . Hypertension   . Insomnia, unspecified   . Laceration of occipital scalp 11/12/2013  . Macular degeneration 06/28/2013  . Macular degeneration (senile) of retina, unspecified   . Osteoporosis   . Other malaise and fatigue   . Other sleep disturbances   . Other specified cardiac dysrhythmias(427.89)   . Other specified personal history presenting hazards to health(V15.89)   . Other symptoms involving cardiovascular system   . Pancreatitis   . Personal history of other diseases of circulatory system   . Personal history of other diseases of digestive system    pancreatitis from gallstones  . Personal history of other diseases of respiratory system   . Personal history of traumatic fracture   . Rosacea   . Senile osteoporosis    with old T11 fracture  . Thyroid disease   . Unspecified disorders of arteries and arterioles   . Unspecified hearing loss   . Unspecified hemorrhoids without mention of complication    Past Surgical History:  Procedure Laterality Date  .  ABDOMINAL HYSTERECTOMY     and BSO fibroids  . COLONOSCOPY  05/23/2007  . JOINT REPLACEMENT  1996  . TOTAL HIP ARTHROPLASTY Right     Allergies  Allergen Reactions  . Ace Inhibitors     cough  . Mirtazapine     Nightmares   . Penicillins Rash    Outpatient Encounter Prescriptions as of 06/09/2016  Medication Sig  . acetaminophen (TYLENOL) 325 MG tablet Take 650 mg by mouth every 6 (six) hours as needed.  Marland Kitchen acetaminophen (TYLENOL)  500 MG tablet Take 500 mg by mouth 2 (two) times daily.  Marland Kitchen amLODipine (NORVASC) 10 MG tablet Take 10 mg by mouth daily.  Marland Kitchen aspirin 81 MG tablet Take 81 mg by mouth daily.  Marland Kitchen BYSTOLIC 10 MG tablet Take one tablet daily for blood pressusre  . clindamycin (CLEOCIN) 150 MG capsule Take 4(1000) capsule by mouth 1 hr prior to dental appointment.  Marland Kitchen escitalopram (LEXAPRO) 5 MG tablet Take 5 mg by mouth daily.  Marland Kitchen levothyroxine (SYNTHROID, LEVOTHROID) 25 MCG tablet Take 25 mcg by mouth daily before breakfast.   . Lidocaine (ASPERCREME LIDOCAINE) 4 % PTCH Apply topically. Apply patch every night(8:00pm)  to lower back, and remove at (8:00 am).  . memantine (NAMENDA XR) 28 MG CP24 24 hr capsule Take 28 mg by mouth.  . polyethylene glycol (MIRALAX / GLYCOLAX) packet Take 17 g by mouth every 3 (three) days.   . polyvinyl alcohol (LIQUIFILM TEARS) 1.4 % ophthalmic solution Place 1 drop into both eyes. One drop in both eye four times daily   No facility-administered encounter medications on file as of 06/09/2016.     Review of Systems  Constitutional: Negative for chills and fever.  HENT: Positive for hearing loss. Negative for congestion, ear discharge, ear pain, nosebleeds and sore throat.   Eyes: Negative for pain, discharge and redness.  Respiratory: Negative for cough, shortness of breath and stridor.   Cardiovascular: Negative for chest pain, palpitations and leg swelling.  Gastrointestinal: Positive for vomiting. Negative for abdominal pain, constipation, diarrhea and nausea.  Endocrine:       Hypothyroid  Genitourinary: Positive for frequency. Negative for dysuria, flank pain and urgency.  Musculoskeletal: Negative for back pain, myalgias and neck pain.       Ambulates with walker.   Skin: Negative for rash.  Neurological: Negative for dizziness, tremors, seizures, weakness and headaches.  Hematological:       Hx anemia  Psychiatric/Behavioral: Negative for agitation, hallucinations and  suicidal ideas. The patient is nervous/anxious.     Immunization History  Administered Date(s) Administered  . DTaP 10/12/2012  . Influenza-Unspecified 11/22/2012, 12/26/2013, 11/12/2014, 12/09/2015  . PPD Test 12/06/2013  . Pneumococcal Polysaccharide-23 09/22/2000  . Td 07/08/2005  . Tdap 11/10/2013   Pertinent  Health Maintenance Due  Topic Date Due  . DEXA SCAN  Oct 31, 201984  . PNA vac Low Risk Adult (2 of 2 - PCV13) 09/22/2001  . INFLUENZA VACCINE  09/22/2016   Fall Risk  01/13/2015 06/28/2013  Falls in the past year? No No  Risk for fall due to : Impaired balance/gait -   Functional Status Survey:    Vitals:   06/09/16 1411  BP: 130/64  Pulse: 70  Resp: 18  Temp: 98 F (36.7 C)  Weight: 124 lb 6.4 oz (56.4 kg)  Height: 5' (1.524 m)   Body mass index is 24.3 kg/m. Physical Exam  Constitutional: She appears well-developed and well-nourished. No distress.  HENT:  Head: Normocephalic  and atraumatic.  Right Ear: External ear normal.  Left Ear: External ear normal.  Nose: Nose normal.  Mouth/Throat: Oropharynx is clear and moist. No oropharyngeal exudate.  Eyes: Conjunctivae and EOM are normal. Pupils are equal, round, and reactive to light. Right eye exhibits no discharge. Left eye exhibits no discharge. No scleral icterus.  Neck: Normal range of motion. Neck supple. No JVD present. No tracheal deviation present. No thyromegaly present.  Cardiovascular: Normal rate, regular rhythm and intact distal pulses.   Murmur (2/6/SEM) heard. Pulmonary/Chest: Effort normal and breath sounds normal. No stridor. No respiratory distress. She has no wheezes. She has no rales.  Abdominal: Soft. Bowel sounds are normal. She exhibits no distension and no mass. There is no tenderness. There is no rebound and no guarding.  Genitourinary: Rectal exam shows guaiac negative stool.  Musculoskeletal: Normal range of motion. She exhibits no edema or tenderness.  Lymphadenopathy:    She has  no cervical adenopathy.  Neurological: She is alert. She has normal reflexes. No cranial nerve deficit. She exhibits abnormal muscle tone. Coordination normal.  Significant dementia. Holding doll.  Skin: Skin is warm. No rash noted. She is not diaphoretic. No erythema. No pallor.  Lemon sized Lentigo to left lower cheek  Psychiatric: She has a normal mood and affect. Her affect is not blunt, not labile and not inappropriate. Her speech is not delayed, not tangential and not slurred. She is not agitated, not aggressive, not hyperactive, not slowed, not withdrawn, not actively hallucinating and not combative. Thought content is paranoid. Thought content is not delusional. Cognition and memory are impaired. She expresses inappropriate judgment. She does not express impulsivity. She expresses no homicidal and no suicidal ideation. She expresses no suicidal plans and no homicidal plans. She is communicative. She exhibits abnormal recent memory and abnormal remote memory. She is attentive.    Labs reviewed:  Recent Labs  07/22/15 12/30/15 06/01/16  NA 139 143 141  K 4.4 3.9 4.5  BUN 32* 33* 31*  CREATININE 1.2* 1.1 1.2*    Recent Labs  07/22/15 12/30/15  AST 22 17  ALT 10 8  ALKPHOS 79 61    Recent Labs  07/22/15 12/30/15 06/01/16  WBC 10.0 4.0 4.7  HGB 13.1 10.4* 11.7*  HCT 38 32* 35*  PLT 195 195 219   Lab Results  Component Value Date   TSH 1.68 06/01/2016   No results found for: HGBA1C No results found for: CHOL, HDL, LDLCALC, LDLDIRECT, TRIG, CHOLHDL  Significant Diagnostic Results in last 30 days:  No results found.  Assessment/Plan Vomiting Continue to observe the patient, may use standing order for symptomatic management, may consider UA C/S, CBC, CMP, lipase, amylase, [place] abd  If symoptins persists  HTN (hypertension) controlled. Continue Amlodipine 10mg , Bystolic 10mg  daily for Bp.Takes ASA for cardiovascular risk reduction. 06/01/16 Na 141, K 4.5, Bun 31, creat 1.15,  wbc 4.7, Hgb 11.7, plt 219, TSH 1.68  Hiatal hernia Stable, off acid reducer.   Constipation Stable, continue MiraLax q 3 days.     Hypothyroidism Continue Levothyroxine 74mcg daily. TSH 1.68 06/01/16  Senile dementia In MCU, walking with walker a lot through out day, continue Namenda   Osteoarthritis Mild, involved multiple joints, lower back, right leg, continue Tylenol 500mg  bid.   Chronic renal disease 06/01/16 creat 1.15  Anemia of chronic disease Stable, Hgb 11.7 06/01/16  Depression with anxiety Stable, continue Lexapro 5mg   Insomnia Sleeps well at night, continue Lexapro.      Family/  staff Communication: SNF [facility]U  Labs/tests ordered:  CBC CMP UA C/S Amylase, Lipase if N/V persist

## 2016-06-09 NOTE — Assessment & Plan Note (Signed)
Stable, Hgb 11.7 06/01/16

## 2016-06-10 DIAGNOSIS — R488 Other symbolic dysfunctions: Secondary | ICD-10-CM | POA: Diagnosis not present

## 2016-06-10 DIAGNOSIS — R5381 Other malaise: Secondary | ICD-10-CM | POA: Diagnosis not present

## 2016-06-10 DIAGNOSIS — K861 Other chronic pancreatitis: Secondary | ICD-10-CM | POA: Diagnosis not present

## 2016-06-10 DIAGNOSIS — R945 Abnormal results of liver function studies: Secondary | ICD-10-CM | POA: Diagnosis not present

## 2016-06-10 DIAGNOSIS — R351 Nocturia: Secondary | ICD-10-CM | POA: Diagnosis not present

## 2016-06-10 LAB — CBC AND DIFFERENTIAL
HCT: 35 % — AB (ref 36–46)
Hemoglobin: 12.3 g/dL (ref 12.0–16.0)
Platelets: 214 10*3/uL (ref 150–399)
WBC: 5.3 10^3/mL

## 2016-06-10 LAB — BASIC METABOLIC PANEL
BUN: 24 mg/dL — AB (ref 4–21)
CREATININE: 1.2 mg/dL — AB (ref <=1.1)
Glucose: 85 mg/dL
Potassium: 3.5 mmol/L (ref 3.4–5.3)
Sodium: 141 mmol/L (ref 137–147)

## 2016-06-10 LAB — HEPATIC FUNCTION PANEL
ALK PHOS: 67 U/L (ref 25–125)
ALT: 11 U/L (ref 7–35)
AST: 24 U/L (ref 13–35)
Bilirubin, Total: 0.5 mg/dL

## 2016-06-14 ENCOUNTER — Other Ambulatory Visit: Payer: Self-pay | Admitting: *Deleted

## 2016-06-28 ENCOUNTER — Non-Acute Institutional Stay (SKILLED_NURSING_FACILITY): Payer: PPO | Admitting: Nurse Practitioner

## 2016-06-28 ENCOUNTER — Encounter: Payer: Self-pay | Admitting: Nurse Practitioner

## 2016-06-28 DIAGNOSIS — K449 Diaphragmatic hernia without obstruction or gangrene: Secondary | ICD-10-CM

## 2016-06-28 DIAGNOSIS — N181 Chronic kidney disease, stage 1: Secondary | ICD-10-CM

## 2016-06-28 DIAGNOSIS — H02132 Senile ectropion of right lower eyelid: Secondary | ICD-10-CM

## 2016-06-28 DIAGNOSIS — M159 Polyosteoarthritis, unspecified: Secondary | ICD-10-CM

## 2016-06-28 DIAGNOSIS — F0391 Unspecified dementia with behavioral disturbance: Secondary | ICD-10-CM | POA: Diagnosis not present

## 2016-06-28 DIAGNOSIS — F418 Other specified anxiety disorders: Secondary | ICD-10-CM

## 2016-06-28 DIAGNOSIS — K5900 Constipation, unspecified: Secondary | ICD-10-CM | POA: Diagnosis not present

## 2016-06-28 DIAGNOSIS — M150 Primary generalized (osteo)arthritis: Secondary | ICD-10-CM

## 2016-06-28 DIAGNOSIS — G4700 Insomnia, unspecified: Secondary | ICD-10-CM | POA: Diagnosis not present

## 2016-06-28 DIAGNOSIS — I10 Essential (primary) hypertension: Secondary | ICD-10-CM

## 2016-06-28 DIAGNOSIS — H02102 Unspecified ectropion of right lower eyelid: Secondary | ICD-10-CM | POA: Insufficient documentation

## 2016-06-28 DIAGNOSIS — E039 Hypothyroidism, unspecified: Secondary | ICD-10-CM | POA: Diagnosis not present

## 2016-06-28 DIAGNOSIS — D638 Anemia in other chronic diseases classified elsewhere: Secondary | ICD-10-CM

## 2016-06-28 DIAGNOSIS — F03918 Unspecified dementia, unspecified severity, with other behavioral disturbance: Secondary | ICD-10-CM

## 2016-06-28 NOTE — Assessment & Plan Note (Signed)
In MCU, walking with walker a lot through out day, continue Namenda. More confused, talkative, restlessness. Obtain CBC CMP UA C/S, observe the patient.

## 2016-06-28 NOTE — Assessment & Plan Note (Signed)
Mild, involved multiple joints, lower back, right leg, continue Tylenol 500mg  bid, 650mg  q6h prn

## 2016-06-28 NOTE — Assessment & Plan Note (Signed)
Continue Levothyroxine 75mcg daily. TSH 1.68 06/01/16

## 2016-06-28 NOTE — Assessment & Plan Note (Signed)
06/10/16 Hgb 12.3

## 2016-06-28 NOTE — Assessment & Plan Note (Signed)
Stable, continue MiraLax q 3 days.  

## 2016-06-28 NOTE — Assessment & Plan Note (Signed)
Confusion, restlessness, talkative, taking Lexapro 5mg , may increase Lexapro if CBC CMP UA C/S unremarkable.

## 2016-06-28 NOTE — Assessment & Plan Note (Signed)
controlled. Continue Amlodipine 10mg , Bystolic 10mg  daily for Bp.Takes ASA for cardiovascular risk reduction. 06/01/16 Na 141, K 4.5, Bun 31, creat 1.15, wbc 4.7, Hgb 11.7, plt 219, TSH 1.68

## 2016-06-28 NOTE — Assessment & Plan Note (Signed)
observe

## 2016-06-28 NOTE — Assessment & Plan Note (Signed)
Stable, off acid reducer 

## 2016-06-28 NOTE — Progress Notes (Signed)
Location:  [facility] Room Number: 107 Place of Service:  SNF (31) Provider:  Trelon Plush, Manxie  NP  Estill Dooms, MD  Patient Care Team: Estill Dooms, MD as PCP - General (Internal Medicine) Devyon Keator X, NP as Nurse Practitioner (Nurse Practitioner) Wilford Corner, MD as Consulting Physician (Gastroenterology) Sanda Klein, MD as Consulting Physician (Cardiology)  Extended Emergency Contact Information Primary Emergency Contact: [name],Pat Address: [place] [address] [name] of [place] Phone: [phone] Mobile Phone: [phone] Relation: Daughter  Code Status:  DNR Goals of care: Advanced Directive information Advanced Directives 06/28/2016  Does Patient Have a Medical Advance Directive? Yes  Type of Paramedic of [place];Out of facility DNR (pink MOST or yellow form)  Does patient want to make changes to medical advance directive? No - Patient declined  Copy of [place] in Chart? Yes  Pre-existing out of facility DNR order (yellow form or pink MOST form) Yellow form placed in chart (order not valid for inpatient use)     Chief Complaint  Patient presents with  . Acute Visit    Right eye redness,    HPI:  Pt is a 83 y.o. female seen today for an acute visit for the right lower eyelid ectropion, otherwise conjunctivae not injected, no eye discharge, itching, pain, or vision change. She appears more confused and talkative than usual, she is afebrile, no O2 desaturation    Hx of hypothyroidism, taking Levothyroxine 9mcg, last TSH 1.68 06/01/16.HTN, controlled on Amlodipine 10mg  and Bystolic 10mg , Dementia, resides in Memory Care Unit, on Namenda for memory, Depression, more confused, talkative, restlessness today, taking Lexapro 5mg  daiily.   Past Medical History:  Diagnosis Date  . Anemia, unspecified   . Anorexia   . Anxiety   . Arthritis   .  Atherosclerotic cerebrovascular disease   . Backache, unspecified   . Cataract   . Cholelithiasis   . Chronic kidney disease, unspecified   . Closed fracture of right inferior pubic ramus ([place]) 11/12/2013   11/10/13 s/p fall, ED eval  X-ray R hip  1. Possible nondisplaced right inferior pubic ramus fracture.  2. No femur fracture or dislocation   02/06/14 dc prn Motrin and Norco-not used    . Depression   . Diaphragmatic hernia without mention of obstruction or gangrene   . Diseases of lips 03/26/2013  . Diverticulosis of colon (without mention of hemorrhage)   . Hemorrhage of rectum and anus 03/26/2013  . Hyperlipidemia   . Hypertension   . Insomnia, unspecified   . Laceration of occipital scalp 11/12/2013  . Macular degeneration 06/28/2013  . Macular degeneration (senile) of retina, unspecified   . Osteoporosis   . Other malaise and fatigue   . Other sleep disturbances   . Other specified cardiac dysrhythmias(427.89)   . Other specified personal history presenting hazards to health(V15.89)   . Other symptoms involving cardiovascular system   . Pancreatitis   . Personal history of other diseases of circulatory system   . Personal history of other diseases of digestive system    pancreatitis from gallstones  . Personal history of other diseases of respiratory system   . Personal history of traumatic fracture   . Rosacea   . Senile osteoporosis    with old T11 fracture  . Thyroid disease   . Unspecified disorders of arteries and arterioles   . Unspecified hearing loss   .  Unspecified hemorrhoids without mention of complication    Past Surgical History:  Procedure Laterality Date  . ABDOMINAL HYSTERECTOMY     and BSO fibroids  . COLONOSCOPY  05/23/2007  . JOINT REPLACEMENT  1996  . TOTAL HIP ARTHROPLASTY Right     Allergies  Allergen Reactions  . Ace Inhibitors     cough  . Mirtazapine     Nightmares   . Penicillins Rash    Outpatient Encounter Prescriptions as of  06/28/2016  Medication Sig  . acetaminophen (TYLENOL) 325 MG tablet Take 650 mg by mouth every 6 (six) hours as needed.  Marland Kitchen acetaminophen (TYLENOL) 500 MG tablet Take 500 mg by mouth 2 (two) times daily.  Marland Kitchen amLODipine (NORVASC) 10 MG tablet Take 10 mg by mouth daily.  Marland Kitchen aspirin 81 MG tablet Take 81 mg by mouth daily.  Marland Kitchen BYSTOLIC 10 MG tablet Take one tablet daily for blood pressusre  . clindamycin (CLEOCIN) 150 MG capsule Take 4(1000) capsule by mouth 1 hr prior to dental appointment.  Marland Kitchen escitalopram (LEXAPRO) 5 MG tablet Take 5 mg by mouth daily.  Marland Kitchen levothyroxine (SYNTHROID, LEVOTHROID) 25 MCG tablet Take 25 mcg by mouth daily before breakfast.   . Lidocaine (ASPERCREME LIDOCAINE) 4 % PTCH Apply topically. Apply patch every night(8:00pm)  to lower back, and remove at (8:00 am).  Marland Kitchen loperamide (IMODIUM A-D) 2 MG tablet Take 2 mg by mouth 4 (four) times daily as needed for diarrhea or loose stools.  . memantine (NAMENDA XR) 28 MG CP24 24 hr capsule Take 28 mg by mouth.  . polyethylene glycol (MIRALAX / GLYCOLAX) packet Take 17 g by mouth every 3 (three) days.   . polyvinyl alcohol (LIQUIFILM TEARS) 1.4 % ophthalmic solution Place 1 drop into both eyes. One drop in both eye four times daily   No facility-administered encounter medications on file as of 06/28/2016.     Review of Systems  Constitutional: Negative for chills and fever.  HENT: Positive for hearing loss. Negative for congestion, ear discharge, ear pain, nosebleeds and sore throat.   Eyes: Negative for pain, discharge and redness.  Respiratory: Negative for cough, shortness of breath and stridor.   Cardiovascular: Negative for chest pain, palpitations and leg swelling.  Gastrointestinal: Positive for vomiting. Negative for abdominal pain, constipation, diarrhea and nausea.  Endocrine:       Hypothyroid  Genitourinary: Positive for frequency. Negative for dysuria, flank pain and urgency.  Musculoskeletal: Negative for back pain,  myalgias and neck pain.       Ambulates with walker.   Skin: Negative for rash.  Neurological: Negative for dizziness, tremors, seizures, weakness and headaches.  Hematological:       Hx anemia  Psychiatric/Behavioral: Positive for confusion. Negative for agitation, hallucinations and suicidal ideas. The patient is nervous/anxious.     Immunization History  Administered Date(s) Administered  . DTaP 10/12/2012  . Influenza-Unspecified 11/22/2012, 12/26/2013, 11/12/2014, 12/09/2015  . PPD Test 12/06/2013  . Pneumococcal Polysaccharide-23 09/22/2000  . Td 07/08/2005  . Tdap 11/10/2013   Pertinent  Health Maintenance Due  Topic Date Due  . DEXA SCAN  December 26, 201984  . PNA vac Low Risk Adult (2 of 2 - PCV13) 09/22/2001  . INFLUENZA VACCINE  09/22/2016   Fall Risk  01/13/2015 06/28/2013  Falls in the past year? No No  Risk for fall due to : Impaired balance/gait -   Functional Status Survey:    Vitals:   06/28/16 1617  BP: (!) 112/50  Pulse: 76  Resp: 20  Temp: 98.2 F (36.8 C)  Weight: 116 lb 6.4 oz (52.8 kg)  Height: 5' (1.524 m)   Body mass index is 22.73 kg/m. Physical Exam  Constitutional: She appears well-developed and well-nourished. No distress.  HENT:  Head: Normocephalic and atraumatic.  Right Ear: External ear normal.  Left Ear: External ear normal.  Nose: Nose normal.  Mouth/Throat: Oropharynx is clear and moist. No oropharyngeal exudate.  Eyes: Conjunctivae and EOM are normal. Pupils are equal, round, and reactive to light. Right eye exhibits no discharge. Left eye exhibits no discharge. No scleral icterus.  Neck: Normal range of motion. Neck supple. No JVD present. No tracheal deviation present. No thyromegaly present.  Cardiovascular: Normal rate, regular rhythm and intact distal pulses.   Murmur (2/6/SEM) heard. Pulmonary/Chest: Effort normal and breath sounds normal. No stridor. No respiratory distress. She has no wheezes. She has no rales.  Abdominal:  Soft. Bowel sounds are normal. She exhibits no distension and no mass. There is no tenderness. There is no rebound and no guarding.  Genitourinary: Rectal exam shows guaiac negative stool.  Musculoskeletal: Normal range of motion. She exhibits no edema or tenderness.  Lymphadenopathy:    She has no cervical adenopathy.  Neurological: She is alert. She has normal reflexes. No cranial nerve deficit. She exhibits abnormal muscle tone. Coordination normal.  Significant dementia. Holding doll.  Skin: Skin is warm. No rash noted. She is not diaphoretic. No erythema. No pallor.  Lemon sized Lentigo to left lower cheek  Psychiatric: She has a normal mood and affect. Her affect is not blunt, not labile and not inappropriate. Her speech is not delayed, not tangential and not slurred. She is not agitated, not aggressive, not hyperactive, not slowed, not withdrawn, not actively hallucinating and not combative. Thought content is paranoid. Thought content is not delusional. Cognition and memory are impaired. She expresses inappropriate judgment. She does not express impulsivity. She expresses no homicidal and no suicidal ideation. She expresses no suicidal plans and no homicidal plans. She is communicative. She exhibits abnormal recent memory and abnormal remote memory. She is attentive.    Labs reviewed:  Recent Labs  12/30/15 06/01/16 06/10/16  NA 143 141 141  K 3.9 4.5 3.5  BUN 33* 31* 24*  CREATININE 1.1 1.2* 1.2*    Recent Labs  07/22/15 12/30/15 06/10/16  AST 22 17 24   ALT 10 8 11   ALKPHOS 79 61 67    Recent Labs  12/30/15 06/01/16 06/10/16  WBC 4.0 4.7 5.3  HGB 10.4* 11.7* 12.3  HCT 32* 35* 35*  PLT 195 219 214   Lab Results  Component Value Date   TSH 1.68 06/01/2016   No results found for: HGBA1C No results found for: CHOL, HDL, LDLCALC, LDLDIRECT, TRIG, CHOLHDL  Significant Diagnostic Results in last 30 days:  No results found.  Assessment/Plan Senile dementia In [facility],  walking with walker a lot through out day, continue Namenda. More confused, talkative, restlessness. Obtain CBC CMP UA C/S, observe the patient.   Ectropion of right lower eyelid observe  HTN (hypertension) controlled. Continue Amlodipine 10mg , Bystolic 10mg  daily for Bp.Takes ASA for cardiovascular risk reduction. 06/01/16 Na 141, K 4.5, Bun 31, creat 1.15, wbc 4.7, Hgb 11.7, plt 219, TSH 1.68  Hiatal hernia Stable, off acid reducer.   Constipation Stable, continue MiraLax q 3 days.    Hypothyroidism Continue Levothyroxine 71mcg daily. TSH 1.68 06/01/16  Osteoarthritis Mild, involved multiple joints, lower back, right leg, continue Tylenol 500mg  bid,  650mg  q6h prn  Chronic renal disease 06/10/16 creat 1.15  Anemia of chronic disease 06/10/16 Hgb 12.3  Depression with anxiety Confusion, restlessness, talkative, taking Lexapro 5mg , may increase Lexapro if CBC CMP UA C/S unremarkable.   Insomnia Sleeps well at night, continue Lexapro.      Family/ staff Communication: [facility]  Labs/tests ordered:  CBC CMP UA C/S

## 2016-06-28 NOTE — Assessment & Plan Note (Signed)
Sleeps well at night, continue Lexapro.

## 2016-06-28 NOTE — Assessment & Plan Note (Signed)
06/10/16 creat 1.15

## 2016-06-29 DIAGNOSIS — R351 Nocturia: Secondary | ICD-10-CM | POA: Diagnosis not present

## 2016-06-29 DIAGNOSIS — I10 Essential (primary) hypertension: Secondary | ICD-10-CM | POA: Diagnosis not present

## 2016-06-29 DIAGNOSIS — R488 Other symbolic dysfunctions: Secondary | ICD-10-CM | POA: Diagnosis not present

## 2016-06-29 LAB — BASIC METABOLIC PANEL
BUN: 25 mg/dL — AB (ref 4–21)
Creatinine: 1.2 mg/dL — AB (ref <=1.1)
Glucose: 70 mg/dL
Potassium: 5.1 mmol/L (ref 3.4–5.3)
Sodium: 141 mmol/L (ref 137–147)

## 2016-06-29 LAB — HEPATIC FUNCTION PANEL
ALT: 14 U/L (ref 7–35)
AST: 29 U/L (ref 13–35)
Alkaline Phosphatase: 50 U/L (ref 25–125)
BILIRUBIN, TOTAL: 0.4 mg/dL

## 2016-06-29 LAB — CBC AND DIFFERENTIAL
HEMATOCRIT: 34 % — AB (ref 36–46)
HEMOGLOBIN: 11.8 g/dL — AB (ref 12.0–16.0)
Platelets: 194 10*3/uL (ref 150–399)
WBC: 4.8 10^3/mL

## 2016-06-30 ENCOUNTER — Other Ambulatory Visit: Payer: Self-pay | Admitting: *Deleted

## 2016-07-26 DIAGNOSIS — H02132 Senile ectropion of right lower eyelid: Secondary | ICD-10-CM | POA: Diagnosis not present

## 2016-07-26 DIAGNOSIS — H353232 Exudative age-related macular degeneration, bilateral, with inactive choroidal neovascularization: Secondary | ICD-10-CM | POA: Diagnosis not present

## 2016-07-26 DIAGNOSIS — H353134 Nonexudative age-related macular degeneration, bilateral, advanced atrophic with subfoveal involvement: Secondary | ICD-10-CM | POA: Diagnosis not present

## 2016-07-26 DIAGNOSIS — H35351 Cystoid macular degeneration, right eye: Secondary | ICD-10-CM | POA: Diagnosis not present

## 2016-08-05 DIAGNOSIS — H11431 Conjunctival hyperemia, right eye: Secondary | ICD-10-CM | POA: Diagnosis not present

## 2016-08-05 DIAGNOSIS — H02132 Senile ectropion of right lower eyelid: Secondary | ICD-10-CM | POA: Diagnosis not present

## 2016-08-19 ENCOUNTER — Non-Acute Institutional Stay: Payer: Self-pay | Admitting: Internal Medicine

## 2016-08-19 ENCOUNTER — Encounter: Payer: Self-pay | Admitting: Internal Medicine

## 2016-08-19 DIAGNOSIS — F0391 Unspecified dementia with behavioral disturbance: Secondary | ICD-10-CM

## 2016-08-19 DIAGNOSIS — E039 Hypothyroidism, unspecified: Secondary | ICD-10-CM

## 2016-08-19 DIAGNOSIS — M159 Polyosteoarthritis, unspecified: Secondary | ICD-10-CM

## 2016-08-19 DIAGNOSIS — F03918 Unspecified dementia, unspecified severity, with other behavioral disturbance: Secondary | ICD-10-CM

## 2016-08-19 DIAGNOSIS — F418 Other specified anxiety disorders: Secondary | ICD-10-CM

## 2016-08-19 DIAGNOSIS — M150 Primary generalized (osteo)arthritis: Secondary | ICD-10-CM

## 2016-08-19 DIAGNOSIS — I10 Essential (primary) hypertension: Secondary | ICD-10-CM

## 2016-08-19 NOTE — Progress Notes (Addendum)
  Review of Systems  Unable to perform ROS: Dementia   Physical Exam  Constitutional:  Frail, thin built, in no distress  HENT:  Head: Normocephalic and atraumatic.  Mouth/Throat: Oropharynx is clear and moist.  Eyes: Conjunctivae are normal. Pupils are equal, round, and reactive to light.  Neck: Normal range of motion. Neck supple.  Cardiovascular: Normal rate and regular rhythm.   Murmur heard. Pulmonary/Chest: Effort normal and breath sounds normal. She has no wheezes. She has no rales.  Abdominal: Soft. Bowel sounds are normal. There is no tenderness. There is no guarding.  Musculoskeletal: Normal range of motion. She exhibits no edema.  Can move all 4 extremities, uses a walker  Lymphadenopathy:    She has no cervical adenopathy.  Neurological: She is alert.  Oriented only to self, talks but mostly non comprehensible   Skin: Skin is warm and dry. No rash noted. She is not diaphoretic.  Psychiatric:  Has advanced dementia    Labs reviewed:  Recent Labs  06/01/16 06/10/16 06/29/16  NA 141 141 141  K 4.5 3.5 5.1  BUN 31* 24* 25*  CREATININE 1.2* 1.2* 1.2*    Recent Labs  12/30/15 06/10/16 06/29/16  AST 17 24 29   ALT 8 11 14   ALKPHOS 61 67 50    Recent Labs  06/01/16 06/10/16 06/29/16  WBC 4.7 5.3 4.8  HGB 11.7* 12.3 11.8*  HCT 35* 35* 34*  PLT 219 214 194   Lab Results  Component Value Date   TSH 1.68 06/01/2016   No results found for: HGBA1C No results found for: CHOL, HDL, LDLCALC, LDLDIRECT, TRIG, CHOLHDL  Significant Diagnostic Results in last 30 days:  No results found.  Assessment/Plan  Please disregard this note, error   This encounter was created in error - please disregard.

## 2016-08-19 NOTE — Addendum Note (Signed)
Addended byBlanchie Serve on: 08/19/2016 04:13 PM   Modules accepted: Level of Service, SmartSet

## 2016-08-20 ENCOUNTER — Encounter: Payer: Self-pay | Admitting: Internal Medicine

## 2016-08-20 ENCOUNTER — Non-Acute Institutional Stay (SKILLED_NURSING_FACILITY): Payer: PPO | Admitting: Internal Medicine

## 2016-08-20 DIAGNOSIS — E039 Hypothyroidism, unspecified: Secondary | ICD-10-CM | POA: Diagnosis not present

## 2016-08-20 DIAGNOSIS — I10 Essential (primary) hypertension: Secondary | ICD-10-CM | POA: Diagnosis not present

## 2016-08-20 DIAGNOSIS — H353134 Nonexudative age-related macular degeneration, bilateral, advanced atrophic with subfoveal involvement: Secondary | ICD-10-CM | POA: Diagnosis not present

## 2016-08-20 DIAGNOSIS — F418 Other specified anxiety disorders: Secondary | ICD-10-CM | POA: Diagnosis not present

## 2016-08-20 DIAGNOSIS — F0390 Unspecified dementia without behavioral disturbance: Secondary | ICD-10-CM | POA: Diagnosis not present

## 2016-08-20 NOTE — Progress Notes (Addendum)
Location:  [facility] Room Number: [place]:  SNF [phone]) Provider:  Blanchie Serve MD  Blanchie Serve, MD  Patient Care Team: Blanchie Serve, MD as PCP - General (Internal Medicine) Mast, Man X, NP as Nurse Practitioner (Nurse Practitioner) Wilford Corner, MD as Consulting Physician (Gastroenterology) Sanda Klein, MD as Consulting Physician (Cardiology)  Extended Emergency Contact Information Primary Emergency Contact: [name],Pat Address: [place] [address] [name] of [place] Phone: [phone] Mobile Phone: [phone] Relation: Daughter  Code Status: DNR Goals of care: Advanced Directive information Advanced Directives 06/28/2016  Does Patient Have a Medical Advance Directive? Yes  Type of Paramedic of [place];Out of facility DNR (pink MOST or yellow form)  Does patient want to make changes to medical advance directive? No - Patient declined  Copy of [place] in Chart? Yes  Pre-existing out of facility DNR order (yellow form or pink MOST form) Yellow form placed in chart (order not valid for inpatient use)     Chief Complaint  Patient presents with  . Medical Management of Chronic Issues    Routine Visit     HPI:  Pt is a 83 y.o. female seen today for medical management of chronic diseases.  She is in memory care unit. She is able to feed herself. She gets around with her walker. No fall has been reported. She participates minimally in HPI and ROS. She is incontinent with her bowel and bladder. No pressure ulcer or wound has been reported. She feeds herself.     Past Medical History:  Diagnosis Date  . Anemia, unspecified   . Anorexia   . Anxiety   . Arthritis   . Atherosclerotic cerebrovascular disease   . Backache, unspecified   . Cataract   . Cholelithiasis   . Chronic kidney disease, unspecified   . Closed fracture of right inferior pubic  ramus ([place]) 11/12/2013   11/10/13 s/p fall, ED eval  X-ray R hip  1. Possible nondisplaced right inferior pubic ramus fracture.  2. No femur fracture or dislocation   02/06/14 dc prn Motrin and Norco-not used    . Depression   . Diaphragmatic hernia without mention of obstruction or gangrene   . Diseases of lips 03/26/2013  . Diverticulosis of colon (without mention of hemorrhage)   . Hemorrhage of rectum and anus 03/26/2013  . Hyperlipidemia   . Hypertension   . Insomnia, unspecified   . Laceration of occipital scalp 11/12/2013  . Macular degeneration 06/28/2013  . Macular degeneration (senile) of retina, unspecified   . Osteoporosis   . Other malaise and fatigue   . Other sleep disturbances   . Other specified cardiac dysrhythmias(427.89)   . Other specified personal history presenting hazards to health(V15.89)   . Other symptoms involving cardiovascular system   . Pancreatitis   . Personal history of other diseases of circulatory system   . Personal history of other diseases of digestive system    pancreatitis from gallstones  . Personal history of other diseases of respiratory system   . Personal history of traumatic fracture   . Rosacea   . Senile osteoporosis    with old T11 fracture  . Thyroid disease   . Unspecified disorders of arteries and arterioles   . Unspecified hearing loss   . Unspecified hemorrhoids without mention of complication    Past Surgical History:  Procedure Laterality Date  .  ABDOMINAL HYSTERECTOMY     and BSO fibroids  . COLONOSCOPY  05/23/2007  . JOINT REPLACEMENT  1996  . TOTAL HIP ARTHROPLASTY Right     Allergies  Allergen Reactions  . Ace Inhibitors     cough  . Mirtazapine     Nightmares   . Penicillins Rash    Outpatient Encounter Prescriptions as of 08/20/2016  Medication Sig  . acetaminophen (TYLENOL) 325 MG tablet Take 650 mg by mouth every 6 (six) hours as needed.  Marland Kitchen acetaminophen (TYLENOL) 500 MG tablet Take 500 mg by mouth 2 (two)  times daily.  Marland Kitchen amLODipine (NORVASC) 10 MG tablet Take 10 mg by mouth daily.  Marland Kitchen aspirin 81 MG tablet Take 81 mg by mouth daily.  Marland Kitchen BYSTOLIC 10 MG tablet Take one tablet daily for blood pressusre  . clindamycin (CLEOCIN) 150 MG capsule Take 4(1000) capsule by mouth 1 hr prior to dental appointment.  Marland Kitchen escitalopram (LEXAPRO) 10 MG tablet Take 10 mg by mouth daily.  Marland Kitchen levothyroxine (SYNTHROID, LEVOTHROID) 25 MCG tablet Take 25 mcg by mouth daily before breakfast.   . Lidocaine (ASPERCREME LIDOCAINE) 4 % PTCH Apply topically. Apply patch every night(8:00pm)  to lower back, and remove at (8:00 am).  Marland Kitchen loperamide (IMODIUM A-D) 2 MG tablet Take 2 mg by mouth as needed for diarrhea or loose stools.   . memantine (NAMENDA XR) 28 MG CP24 24 hr capsule Take 28 mg by mouth.  . Nutritional Supplements (BENECALORIE PO) Take 1 Container by mouth daily.  . polyethylene glycol (MIRALAX / GLYCOLAX) packet Take 17 g by mouth every 3 (three) days.   . polyvinyl alcohol (LIQUIFILM TEARS) 1.4 % ophthalmic solution Place 1 drop into both eyes 4 (four) times daily.    No facility-administered encounter medications on file as of 08/20/2016.     Review of Systems  Unable to perform ROS: Dementia  Constitutional: Negative for appetite change and fever.  HENT: Negative for mouth sores.   Respiratory: Negative for cough and shortness of breath.   Cardiovascular: Negative for chest pain.  Neurological: Negative for seizures and syncope.  Psychiatric/Behavioral: Positive for confusion.    Immunization History  Administered Date(s) Administered  . DTaP 10/12/2012  . Influenza-Unspecified 11/22/2012, 12/26/2013, 11/12/2014, 12/09/2015  . PPD Test 12/06/2013  . Pneumococcal Polysaccharide-23 09/22/2000  . Td 07/08/2005  . Tdap 11/10/2013   Pertinent  Health Maintenance Due  Topic Date Due  . PNA vac Low Risk Adult (2 of 2 - PCV13) 02/22/2017 (Originally 09/22/2001)  . DEXA SCAN  08/19/2017 (Originally 2019/10/2182)    . INFLUENZA VACCINE  09/22/2016   Fall Risk  01/13/2015 06/28/2013  Falls in the past year? No No  Risk for fall due to : Impaired balance/gait -   Functional Status Survey:    Vitals:   08/20/16 1127  BP: 108/62  Pulse: 68  Resp: 16  Temp: 98.5 F (36.9 C)  TempSrc: Oral  Weight: 117 lb 6.4 oz (53.3 kg)  Height: 5' (1.524 m)   Body mass index is 22.93 kg/m. Physical Exam  Constitutional: She appears well-developed and well-nourished. No distress.  HENT:  Head: Normocephalic and atraumatic.  Mouth/Throat: Oropharynx is clear and moist.  Right lower eyelid ectropion, mild erythema to right conjunctiva  Eyes: Pupils are equal, round, and reactive to light.  Right lower eyelid ectropion, mild erythema to right conjunctiva  Neck: Normal range of motion. Neck supple.  Cardiovascular: Normal rate and regular rhythm.   Murmur heard. Pulmonary/Chest: Effort  normal and breath sounds normal. She has no wheezes. She has no rales.  Abdominal: Soft. Bowel sounds are normal. She exhibits no distension. There is no tenderness. There is no guarding.  Musculoskeletal:  Trace edema. Can move all 4 extremities, uses a walker  Lymphadenopathy:    She has no cervical adenopathy.  Neurological:  Pleasantly confused, oriented only to self  Skin: Skin is warm and dry. She is not diaphoretic.  Psychiatric: She has a normal mood and affect.  dementia    Labs reviewed:  Recent Labs  06/01/16 06/10/16 06/29/16  NA 141 141 141  K 4.5 3.5 5.1  BUN 31* 24* 25*  CREATININE 1.2* 1.2* 1.2*    Recent Labs  12/30/15 06/10/16 06/29/16  AST 17 24 29   ALT 8 11 14   ALKPHOS 61 67 50    Recent Labs  06/01/16 06/10/16 06/29/16  WBC 4.7 5.3 4.8  HGB 11.7* 12.3 11.8*  HCT 35* 35* 34*  PLT 219 214 194   Lab Results  Component Value Date   TSH 1.68 06/01/2016   No results found for: HGBA1C No results found for: CHOL, HDL, LDLCALC, LDLDIRECT, TRIG, CHOLHDL  Significant Diagnostic Results  in last 30 days:  No results found.  Assessment/Plan   HTN Currently on bystolic 10 mg daily and amlodipine 10 mg daily with baby aspirin. Low BP reading on review. Decrease amlodipine to 5 mg daily and monitor.   Senile dementia Supportive care, c/w memantine  Hypothyroidism Lab Results  Component Value Date   TSH 1.68 06/01/2016   Stable, continue levothyroxine 25 mcg daily and monitor  Depression with anxiety Stable mood, currently on escitalopram 10 mg daily. Attempt GDR to 5 mg daily and monitor.  Advanced atrophic age related macular degeneration Seen by eye doctor 07/26/16, notes reviewed. Continue liquifilm tear drops.    Family/ staff Communication: reviewed care plan with patient and charge nurse.    Labs/tests ordered:  none   Blanchie Serve, MD Internal Medicine [facility] [address] [place], [place] [address] Cell Phone (Monday-Friday 8 am - 5 pm): [phone] On Call: [phone] and follow prompts after 5 pm and on weekends Office Phone: [phone] Office Fax: [phone]

## 2016-08-23 ENCOUNTER — Non-Acute Institutional Stay (SKILLED_NURSING_FACILITY): Payer: PPO | Admitting: Nurse Practitioner

## 2016-08-23 ENCOUNTER — Encounter: Payer: Self-pay | Admitting: Nurse Practitioner

## 2016-08-23 DIAGNOSIS — H00019 Hordeolum externum unspecified eye, unspecified eyelid: Secondary | ICD-10-CM | POA: Insufficient documentation

## 2016-08-23 DIAGNOSIS — H00012 Hordeolum externum right lower eyelid: Secondary | ICD-10-CM

## 2016-08-23 DIAGNOSIS — H02132 Senile ectropion of right lower eyelid: Secondary | ICD-10-CM

## 2016-08-23 DIAGNOSIS — F0390 Unspecified dementia without behavioral disturbance: Secondary | ICD-10-CM

## 2016-08-23 NOTE — Progress Notes (Signed)
Location:  [facility] Room Number: 107 Place of Service:  SNF (31) Provider:  Lailani Tool, Manxie NP  Blanchie Serve, MD  Patient Care Team: Blanchie Serve, MD as PCP - General (Internal Medicine) Narvel Kozub X, NP as Nurse Practitioner (Nurse Practitioner) Wilford Corner, MD as Consulting Physician (Gastroenterology) Sanda Klein, MD as Consulting Physician (Cardiology)  Extended Emergency Contact Information Primary Emergency Contact: [name],Pat Address: [place] [address] [name] of [place] Phone: [phone] Mobile Phone: [phone] Relation: Daughter  Code Status: DNR Goals of care: Advanced Directive information Advanced Directives 08/23/2016  Does Patient Have a Medical Advance Directive? Yes  Type of Paramedic of [place];Out of facility DNR (pink MOST or yellow form)  Does patient want to make changes to medical advance directive? No - Patient declined  Copy of [place] in Chart? Yes  Pre-existing out of facility DNR order (yellow form or pink MOST form) Yellow form placed in chart (order not valid for inpatient use)     Chief Complaint  Patient presents with  . Acute Visit    (Rt) eye sty ?    HPI:  Pt is a 83 y.o. female seen today for an acute visit for right upper eyelid stye, lowe lid ectropion, no change in vision, not conjunctival redness. The right lower eyelid ectropion.     Dementia, resides in [facility], on [place] for memory,    Past Medical History:  Diagnosis Date  . Anemia, unspecified   . Anorexia   . Anxiety   . Arthritis   . Atherosclerotic cerebrovascular disease   . Backache, unspecified   . Cataract   . Cholelithiasis   . Chronic kidney disease, unspecified   . Closed fracture of right inferior pubic ramus ([place]) 11/12/2013   11/10/13 s/p fall, ED eval  X-ray R hip  1. Possible nondisplaced right inferior pubic ramus fracture.    2. No femur fracture or dislocation   02/06/14 dc prn Motrin and Norco-not used    . Depression   . Diaphragmatic hernia without mention of obstruction or gangrene   . Diseases of lips 03/26/2013  . Diverticulosis of colon (without mention of hemorrhage)   . Hemorrhage of rectum and anus 03/26/2013  . Hyperlipidemia   . Hypertension   . Insomnia, unspecified   . Laceration of occipital scalp 11/12/2013  . Macular degeneration 06/28/2013  . Macular degeneration (senile) of retina, unspecified   . Osteoporosis   . Other malaise and fatigue   . Other sleep disturbances   . Other specified cardiac dysrhythmias(427.89)   . Other specified personal history presenting hazards to health(V15.89)   . Other symptoms involving cardiovascular system   . Pancreatitis   . Personal history of other diseases of circulatory system   . Personal history of other diseases of digestive system    pancreatitis from gallstones  . Personal history of other diseases of respiratory system   . Personal history of traumatic fracture   . Rosacea   . Senile osteoporosis    with old T11 fracture  . Thyroid disease   . Unspecified disorders of arteries and arterioles   . Unspecified hearing loss   . Unspecified hemorrhoids without mention of complication    Past Surgical History:  Procedure Laterality Date  . ABDOMINAL HYSTERECTOMY     and BSO fibroids  . COLONOSCOPY  05/23/2007  . JOINT REPLACEMENT  1996  . TOTAL HIP ARTHROPLASTY Right     Allergies  Allergen Reactions  . Ace Inhibitors     cough  . Mirtazapine     Nightmares   . Penicillins Rash    Outpatient Encounter Prescriptions as of 08/23/2016  Medication Sig  . acetaminophen (TYLENOL) 325 MG tablet Take 650 mg by mouth every 6 (six) hours as needed.  Marland Kitchen acetaminophen (TYLENOL) 500 MG tablet Take 500 mg by mouth 2 (two) times daily.  Marland Kitchen aspirin 81 MG tablet Take 81 mg by mouth daily.  Marland Kitchen BYSTOLIC 10 MG tablet Take one tablet daily for blood  pressusre  . clindamycin (CLEOCIN) 150 MG capsule Take 4(1000) capsule by mouth 1 hr prior to dental appointment.  Marland Kitchen levothyroxine (SYNTHROID, LEVOTHROID) 25 MCG tablet Take 25 mcg by mouth daily before breakfast.   . Lidocaine (ASPERCREME LIDOCAINE) 4 % PTCH Apply topically. Apply patch every night(8:00pm)  to lower back, and remove at (8:00 am).  Marland Kitchen loperamide (IMODIUM A-D) 2 MG tablet Take 2 mg by mouth as needed for diarrhea or loose stools.   . memantine (NAMENDA XR) 28 MG CP24 24 hr capsule Take 28 mg by mouth.  . Nutritional Supplements (BENECALORIE PO) Take 1 Container by mouth daily.  . polyethylene glycol (MIRALAX / GLYCOLAX) packet Take 17 g by mouth every 3 (three) days.   . polyvinyl alcohol (LIQUIFILM TEARS) 1.4 % ophthalmic solution Place 1 drop into both eyes 4 (four) times daily.   . [DISCONTINUED] amLODipine (NORVASC) 10 MG tablet Take 10 mg by mouth daily.  . [DISCONTINUED] escitalopram (LEXAPRO) 10 MG tablet Take 10 mg by mouth daily.   No facility-administered encounter medications on file as of 08/23/2016.     Review of Systems  Constitutional: Negative for chills and fever.  HENT: Positive for hearing loss. Negative for ear pain, nosebleeds and sore throat.   Eyes: Positive for pain. Negative for discharge and redness.       Right lower eye lid redness, chronic ectropion. A stye at the medial aspect of the right upper lid.   Respiratory: Negative for cough and shortness of breath.   Cardiovascular: Negative for leg swelling.  Gastrointestinal: Positive for vomiting. Negative for constipation, diarrhea and nausea.  Endocrine:       Hypothyroid  Genitourinary: Positive for frequency. Negative for dysuria, flank pain and urgency.  Musculoskeletal: Negative for back pain, myalgias and neck pain.       Ambulates with walker.   Skin: Negative for rash.  Neurological: Negative for tremors and weakness.  Hematological:       Hx anemia  Psychiatric/Behavioral: Positive for  confusion. Negative for agitation, hallucinations and suicidal ideas. The patient is nervous/anxious.     Immunization History  Administered Date(s) Administered  . DTaP 10/12/2012  . Influenza-Unspecified 11/22/2012, 12/26/2013, 11/12/2014, 12/09/2015  . PPD Test 12/06/2013  . Pneumococcal Polysaccharide-23 09/22/2000  . Td 07/08/2005  . Tdap 11/10/2013   Pertinent  Health Maintenance Due  Topic Date Due  . PNA vac Low Risk Adult (2 of 2 - PCV13) 02/22/2017 (Originally 09/22/2001)  . DEXA SCAN  08/19/2017 (Originally 04/23/201984)  . INFLUENZA VACCINE  09/22/2016   Fall Risk  01/13/2015 06/28/2013  Falls in the past year? No No  Risk for fall due to : Impaired balance/gait -   Functional Status Survey:    Vitals:   08/23/16 1404  BP: 118/60  Pulse: 92  Resp: 20  Temp: 97.5 F (36.4 C)  Weight: 117  lb 6.4 oz (53.3 kg)  Height: 5' (1.524 m)   Body mass index is 22.93 kg/m. Physical Exam  Constitutional: She appears well-developed and well-nourished. No distress.  HENT:  Head: Normocephalic and atraumatic.  Right Ear: External ear normal.  Left Ear: External ear normal.  Nose: Nose normal.  Mouth/Throat: Oropharynx is clear and moist. No oropharyngeal exudate.  Eyes: Conjunctivae and EOM are normal. Pupils are equal, round, and reactive to light. Right eye exhibits no discharge. Left eye exhibits no discharge. No scleral icterus.  Right lower eye lid redness, chronic ectropion. A stye at the medial aspect of the right upper lid.  Neck: Normal range of motion. Neck supple.  Cardiovascular: Normal rate, regular rhythm and intact distal pulses.   Murmur (2/6/SEM) heard. Pulmonary/Chest: Effort normal and breath sounds normal. No respiratory distress. She has no wheezes. She has no rales.  Abdominal: Soft. Bowel sounds are normal.  Musculoskeletal: Normal range of motion. She exhibits no edema or tenderness.  Neurological: She is alert. She has normal reflexes. No cranial  nerve deficit. She exhibits abnormal muscle tone. Coordination normal.  Significant dementia. Holding doll.  Skin: Skin is warm. She is not diaphoretic.  Lemon sized Lentigo to left lower cheek  Psychiatric: She has a normal mood and affect. Her affect is not blunt, not labile and not inappropriate. Her speech is not delayed, not tangential and not slurred. She is not agitated, not aggressive, not hyperactive, not slowed, not withdrawn, not actively hallucinating and not combative. Thought content is paranoid. Thought content is not delusional. Cognition and memory are impaired. She expresses inappropriate judgment. She does not express impulsivity. She expresses no homicidal and no suicidal ideation. She expresses no suicidal plans and no homicidal plans. She is communicative. She exhibits abnormal recent memory and abnormal remote memory. She is attentive.    Labs reviewed:  Recent Labs  06/01/16 06/10/16 06/29/16  NA 141 141 141  K 4.5 3.5 5.1  BUN 31* 24* 25*  CREATININE 1.2* 1.2* 1.2*    Recent Labs  12/30/15 06/10/16 06/29/16  AST 17 24 29   ALT 8 11 14   ALKPHOS 61 67 50    Recent Labs  06/01/16 06/10/16 06/29/16  WBC 4.7 5.3 4.8  HGB 11.7* 12.3 11.8*  HCT 35* 35* 34*  PLT 219 214 194   Lab Results  Component Value Date   TSH 1.68 06/01/2016   No results found for: HGBA1C No results found for: CHOL, HDL, LDLCALC, LDLDIRECT, TRIG, CHOLHDL  Significant Diagnostic Results in last 30 days:  No results found.  Assessment/Plan Stye external 1% erythromycin ophthalmic ointment 1cm ribbon lower conjunctival sac OU nightly x 1week.   Senile dementia Resides in [facility], walking with walker a lot through out day, continue Namenda. More confused, talkative, restlessness. Obtain CBC CMP UA C/S, observe the patient.    Ectropion of right lower eyelid Chronic     Family/ staff Communication: SN[facility]  Labs/tests ordered:  none

## 2016-08-23 NOTE — Assessment & Plan Note (Signed)
1% erythromycin ophthalmic ointment 1cm ribbon lower conjunctival sac OU nightly x 1week.

## 2016-08-23 NOTE — Assessment & Plan Note (Signed)
Resides in [facility], walking with walker a lot through out day, continue Namenda. More confused, talkative, restlessness. Obtain CBC CMP UA C/S, observe the patient.

## 2016-08-23 NOTE — Assessment & Plan Note (Signed)
Chronic. 

## 2016-08-30 ENCOUNTER — Non-Acute Institutional Stay (SKILLED_NURSING_FACILITY): Payer: PPO | Admitting: Nurse Practitioner

## 2016-08-30 ENCOUNTER — Encounter: Payer: Self-pay | Admitting: Nurse Practitioner

## 2016-08-30 DIAGNOSIS — I10 Essential (primary) hypertension: Secondary | ICD-10-CM | POA: Diagnosis not present

## 2016-08-30 DIAGNOSIS — F0390 Unspecified dementia without behavioral disturbance: Secondary | ICD-10-CM

## 2016-08-30 DIAGNOSIS — R609 Edema, unspecified: Secondary | ICD-10-CM

## 2016-08-30 NOTE — Assessment & Plan Note (Signed)
Trace edema noted in the R+L ankles, no noted cough, phlegm production, or O2 desaturation. Will weight daily ac breakfast, update CMP, obtain BNP. Observe the patient.

## 2016-08-30 NOTE — Assessment & Plan Note (Signed)
The patient appears more confused than usual, unable to an reliable HPI, no pain palpated in her suprapubic or CVA areas, she is afebrile. Will obtain CBC UA C/S to r/o UTI. Continue Namenda.

## 2016-08-30 NOTE — Assessment & Plan Note (Signed)
Blood pressure is controlled. She is off Amlodipine 10mg , taking Bystolic 10mg  daily for Bp.Takes ASA for cardiovascular risk reduction

## 2016-08-30 NOTE — Progress Notes (Signed)
Location:  [facility] Room Number: 107 Place of Service:  SNF (31) Provider:  Mast, Manxie NP  Blanchie Serve, MD  Patient Care Team: Blanchie Serve, MD as PCP - General (Internal Medicine) Mast, Man X, NP as Nurse Practitioner (Nurse Practitioner) Wilford Corner, MD as Consulting Physician (Gastroenterology) Sanda Klein, MD as Consulting Physician (Cardiology)  Extended Emergency Contact Information Primary Emergency Contact: [name],Pat Address: [place] [address] [name] of [place] Phone: [phone] Mobile Phone: [phone] Relation: Daughter  Code Status: DNR Goals of care: Advanced Directive information Advanced Directives 08/30/2016  Does Patient Have a Medical Advance Directive? Yes  Type of Paramedic of [place];Out of facility DNR (pink MOST or yellow form)  Does patient want to make changes to medical advance directive? No - Patient declined  Copy of [place] in Chart? -  Pre-existing out of facility DNR order (yellow form or pink MOST form) -     Chief Complaint  Patient presents with  . Acute Visit    bilateral ankles swelling    HPI:  Pt is a 83 y.o. female seen today for an acute visit for ankle edema, R+L, no noted injury, no observed cough, sputum production, no O2 desaturation, she is afebrile. The patient seems more confused than usual with no new focal neurological symptoms.     Dementia, resides in [facility], on [place] for memory, HTN is managed with Bystolic 10mg  daily.    Past Medical History:  Diagnosis Date  . Anemia, unspecified   . Anorexia   . Anxiety   . Arthritis   . Atherosclerotic cerebrovascular disease   . Backache, unspecified   . Cataract   . Cholelithiasis   . Chronic kidney disease, unspecified   . Closed fracture of right inferior pubic ramus ([place]) 11/12/2013   11/10/13 s/p fall, ED eval  X-ray R hip  1.  Possible nondisplaced right inferior pubic ramus fracture.  2. No femur fracture or dislocation   02/06/14 dc prn Motrin and Norco-not used    . Depression   . Diaphragmatic hernia without mention of obstruction or gangrene   . Diseases of lips 03/26/2013  . Diverticulosis of colon (without mention of hemorrhage)   . Hemorrhage of rectum and anus 03/26/2013  . Hyperlipidemia   . Hypertension   . Insomnia, unspecified   . Laceration of occipital scalp 11/12/2013  . Macular degeneration 06/28/2013  . Macular degeneration (senile) of retina, unspecified   . Osteoporosis   . Other malaise and fatigue   . Other sleep disturbances   . Other specified cardiac dysrhythmias(427.89)   . Other specified personal history presenting hazards to health(V15.89)   . Other symptoms involving cardiovascular system   . Pancreatitis   . Personal history of other diseases of circulatory system   . Personal history of other diseases of digestive system    pancreatitis from gallstones  . Personal history of other diseases of respiratory system   . Personal history of traumatic fracture   . Rosacea   . Senile osteoporosis    with old T11 fracture  . Thyroid disease   . Unspecified disorders of arteries and arterioles   . Unspecified hearing loss   . Unspecified hemorrhoids without mention of complication    Past Surgical History:  Procedure Laterality Date  . ABDOMINAL HYSTERECTOMY     and BSO fibroids  . COLONOSCOPY  05/23/2007  . JOINT REPLACEMENT  1996  . TOTAL HIP ARTHROPLASTY Right     Allergies  Allergen Reactions  . Ace Inhibitors     cough  . Mirtazapine     Nightmares   . Penicillins Rash    Outpatient Encounter Prescriptions as of 08/30/2016  Medication Sig  . acetaminophen (TYLENOL) 325 MG tablet Take 650 mg by mouth every 6 (six) hours as needed.  Marland Kitchen acetaminophen (TYLENOL) 500 MG tablet Take 500 mg by mouth 2 (two) times daily.  Marland Kitchen amLODipine (NORVASC) 5 MG tablet Take 5 mg by mouth  daily.  Marland Kitchen aspirin 81 MG tablet Take 81 mg by mouth daily.  Marland Kitchen BYSTOLIC 10 MG tablet Take one tablet daily for blood pressusre  . clindamycin (CLEOCIN) 150 MG capsule Take 4(1000) capsule by mouth 1 hr prior to dental appointment.  Marland Kitchen erythromycin ophthalmic ointment Place 1 application into the right eye at bedtime.  Marland Kitchen levothyroxine (SYNTHROID, LEVOTHROID) 25 MCG tablet Take 25 mcg by mouth daily before breakfast.   . Lidocaine (ASPERCREME LIDOCAINE) 4 % PTCH Apply topically. Apply patch every night(8:00pm)  to lower back, and remove at (8:00 am).  Marland Kitchen loperamide (IMODIUM A-D) 2 MG tablet Take 2 mg by mouth as needed for diarrhea or loose stools.   . memantine (NAMENDA XR) 28 MG CP24 24 hr capsule Take 28 mg by mouth.  . Nutritional Supplements (BENECALORIE PO) Take 1 Container by mouth daily.  . polyethylene glycol (MIRALAX / GLYCOLAX) packet Take 17 g by mouth every 3 (three) days.   . polyvinyl alcohol (LIQUIFILM TEARS) 1.4 % ophthalmic solution Place 1 drop into both eyes 4 (four) times daily.    No facility-administered encounter medications on file as of 08/30/2016.     Review of Systems  Constitutional: Negative for chills and fever.  HENT: Positive for hearing loss. Negative for ear pain, nosebleeds and sore throat.   Eyes: Positive for pain. Negative for discharge and redness.       Right lower eye lid redness, chronic ectropion. A stye at the medial aspect of the right upper lid.   Respiratory: Negative for cough and shortness of breath.   Cardiovascular: Positive for leg swelling.       Ankles  Gastrointestinal: Positive for vomiting. Negative for constipation, diarrhea and nausea.  Endocrine:       Hypothyroid  Genitourinary: Positive for frequency. Negative for dysuria, flank pain and urgency.  Musculoskeletal: Negative for back pain, myalgias and neck pain.       Ambulates with walker.   Skin: Negative for rash.  Neurological: Negative for tremors and weakness.  Hematological:        Hx anemia  Psychiatric/Behavioral: Positive for confusion. Negative for agitation, hallucinations and suicidal ideas. The patient is nervous/anxious.        Increased confusion.     Immunization History  Administered Date(s) Administered  . DTaP 10/12/2012  . Influenza-Unspecified 11/22/2012, 12/26/2013, 11/12/2014, 12/09/2015  . PPD Test 12/06/2013  . Pneumococcal Polysaccharide-23 09/22/2000  . Td 07/08/2005  . Tdap 11/10/2013   Pertinent  Health Maintenance Due  Topic Date Due  . PNA vac Low Risk Adult (2 of 2 - PCV13) 02/22/2017 (Originally 09/22/2001)  . DEXA SCAN  08/19/2017 (Originally 08-24-1982)  . INFLUENZA VACCINE  09/22/2016   Fall Risk  01/13/2015 06/28/2013  Falls in the past year? No No  Risk for fall due to : Impaired balance/gait -   Functional Status Survey:    Vitals:  08/30/16 1519  BP: 122/74  Pulse: 68  Resp: 20  Temp: 97.8 F (36.6 C)  Weight: 119 lb 9.6 oz (54.3 kg)  Height: 5' (1.524 m)   Body mass index is 23.36 kg/m. Physical Exam  Constitutional: She appears well-developed and well-nourished. No distress.  HENT:  Head: Normocephalic and atraumatic.  Right Ear: External ear normal.  Left Ear: External ear normal.  Nose: Nose normal.  Mouth/Throat: Oropharynx is clear and moist. No oropharyngeal exudate.  Eyes: Conjunctivae and EOM are normal. Pupils are equal, round, and reactive to light. Right eye exhibits no discharge. Left eye exhibits no discharge. No scleral icterus.  Right lower eye lid redness, chronic ectropion. A stye at the medial aspect of the right upper lid.  Neck: Normal range of motion. Neck supple.  Cardiovascular: Normal rate, regular rhythm and intact distal pulses.   Murmur (2/6/SEM) heard. Pulmonary/Chest: Effort normal and breath sounds normal. No respiratory distress. She has no wheezes. She has no rales.  Abdominal: Soft. Bowel sounds are normal.  Musculoskeletal: Normal range of motion. She exhibits edema.  She exhibits no tenderness.  Ankle edema, trace.   Neurological: She is alert. She has normal reflexes. No cranial nerve deficit. She exhibits abnormal muscle tone. Coordination normal.  Significant dementia. Holding doll.  Skin: Skin is warm. She is not diaphoretic.  Lemon sized Lentigo to left lower cheek  Psychiatric: She has a normal mood and affect. Her affect is not blunt, not labile and not inappropriate. Her speech is not delayed, not tangential and not slurred. She is not agitated, not aggressive, not hyperactive, not slowed, not withdrawn, not actively hallucinating and not combative. Thought content is paranoid. Thought content is not delusional. Cognition and memory are impaired. She expresses inappropriate judgment. She does not express impulsivity. She expresses no homicidal and no suicidal ideation. She expresses no suicidal plans and no homicidal plans. She is communicative. She exhibits abnormal recent memory and abnormal remote memory. She is attentive.    Labs reviewed:  Recent Labs  06/01/16 06/10/16 06/29/16  NA 141 141 141  K 4.5 3.5 5.1  BUN 31* 24* 25*  CREATININE 1.2* 1.2* 1.2*    Recent Labs  12/30/15 06/10/16 06/29/16  AST 17 24 29   ALT 8 11 14   ALKPHOS 61 67 50    Recent Labs  06/01/16 06/10/16 06/29/16  WBC 4.7 5.3 4.8  HGB 11.7* 12.3 11.8*  HCT 35* 35* 34*  PLT 219 214 194   Lab Results  Component Value Date   TSH 1.68 06/01/2016   No results found for: HGBA1C No results found for: CHOL, HDL, LDLCALC, LDLDIRECT, TRIG, CHOLHDL  Significant Diagnostic Results in last 30 days:  No results found.  Assessment/Plan Edema Trace edema noted in the R+L ankles, no noted cough, phlegm production, or O2 desaturation. Will weight daily ac breakfast, update CMP, obtain BNP. Observe the patient.   Senile dementia The patient appears more confused than usual, unable to an reliable HPI, no pain palpated in her suprapubic or CVA areas, she is afebrile. Will  obtain CBC UA C/S to r/o UTI. Continue Namenda.   HTN (hypertension) Blood pressure is controlled. She is off Amlodipine 10mg , taking Bystolic 10mg  daily for Bp.Takes ASA for cardiovascular risk reduction     Family/ staff Communication: [facility] weight daily ac breakfast to evaluate ankle edema/fluid retention.   Labs/tests ordered:  CBC CMP BNP UA C/S

## 2016-08-31 ENCOUNTER — Other Ambulatory Visit: Payer: Self-pay | Admitting: *Deleted

## 2016-08-31 DIAGNOSIS — E039 Hypothyroidism, unspecified: Secondary | ICD-10-CM | POA: Diagnosis not present

## 2016-08-31 DIAGNOSIS — I10 Essential (primary) hypertension: Secondary | ICD-10-CM | POA: Diagnosis not present

## 2016-08-31 DIAGNOSIS — M6281 Muscle weakness (generalized): Secondary | ICD-10-CM | POA: Diagnosis not present

## 2016-08-31 LAB — BASIC METABOLIC PANEL
BUN: 30 — AB (ref 4–21)
CREATININE: 0.9 (ref <=1.1)
Glucose: 78
POTASSIUM: 4.2 (ref 3.4–5.3)
Sodium: 142 (ref 137–147)

## 2016-08-31 LAB — CBC AND DIFFERENTIAL
HCT: 34 — AB (ref 36–46)
HEMOGLOBIN: 11.4 — AB (ref 12.0–16.0)
Platelets: 220 (ref 150–399)
WBC: 4.6

## 2016-08-31 LAB — HEPATIC FUNCTION PANEL
ALK PHOS: 70 (ref 25–125)
ALT: 12 (ref 7–35)
AST: 19 (ref 13–35)
Bilirubin, Total: 0.4

## 2016-09-01 DIAGNOSIS — R05 Cough: Secondary | ICD-10-CM | POA: Diagnosis not present

## 2016-09-02 ENCOUNTER — Encounter: Payer: Self-pay | Admitting: Nurse Practitioner

## 2016-09-02 DIAGNOSIS — J189 Pneumonia, unspecified organism: Secondary | ICD-10-CM | POA: Insufficient documentation

## 2016-09-03 ENCOUNTER — Non-Acute Institutional Stay (SKILLED_NURSING_FACILITY): Payer: PPO | Admitting: Nurse Practitioner

## 2016-09-03 ENCOUNTER — Encounter: Payer: Self-pay | Admitting: Nurse Practitioner

## 2016-09-03 DIAGNOSIS — I4891 Unspecified atrial fibrillation: Secondary | ICD-10-CM

## 2016-09-03 DIAGNOSIS — R000 Tachycardia, unspecified: Secondary | ICD-10-CM | POA: Diagnosis not present

## 2016-09-03 DIAGNOSIS — I10 Essential (primary) hypertension: Secondary | ICD-10-CM | POA: Diagnosis not present

## 2016-09-03 DIAGNOSIS — I509 Heart failure, unspecified: Secondary | ICD-10-CM

## 2016-09-03 DIAGNOSIS — S51011A Laceration without foreign body of right elbow, initial encounter: Secondary | ICD-10-CM

## 2016-09-03 DIAGNOSIS — W19XXXA Unspecified fall, initial encounter: Secondary | ICD-10-CM | POA: Insufficient documentation

## 2016-09-03 DIAGNOSIS — J181 Lobar pneumonia, unspecified organism: Secondary | ICD-10-CM

## 2016-09-03 DIAGNOSIS — N3000 Acute cystitis without hematuria: Secondary | ICD-10-CM

## 2016-09-03 DIAGNOSIS — R609 Edema, unspecified: Secondary | ICD-10-CM | POA: Diagnosis not present

## 2016-09-03 DIAGNOSIS — J189 Pneumonia, unspecified organism: Secondary | ICD-10-CM

## 2016-09-03 NOTE — Assessment & Plan Note (Addendum)
ankle edema and elevated BNP in 300s, 09/01/16 echocardiogram showed normal LV function, moderate pulmonary hypertension. Continue to monitor s/s of developing decompensation of CHF, may gentle diurese the patient as needed. Furosemide 20mg  qd Kcl 56meq qd x 3 days.

## 2016-09-03 NOTE — Assessment & Plan Note (Signed)
09/01/16 CXR right lung apex infiltrate: Levaquin 500mg  x 10 days.  09/03/16 afebrile, no O2 desaturation, occasional cough, continue to observe the patient.

## 2016-09-03 NOTE — Assessment & Plan Note (Signed)
HR 108 bpm, irregular, sustained ST @ right elbow from falling 09/02/16 when she walked to the bathroom after diner, the patient has been treated for PNA, UTI with Levaquin, CHF with ankle edema and elevated BNP in 300s, 09/01/16 echocardiogram showed normal LV function. Will obtain EKG, observe for developing decompensation of CHF

## 2016-09-03 NOTE — Assessment & Plan Note (Signed)
No change, in ankles.

## 2016-09-03 NOTE — Progress Notes (Signed)
Location:  [facility] Room Number: 107 Place of Service:  SNF (31) Provider:  Thayden Lemire, Manxie  NP  Blanchie Serve, MD  Patient Care Team: Blanchie Serve, MD as PCP - General (Internal Medicine) Setareh Rom X, NP as Nurse Practitioner (Nurse Practitioner) Wilford Corner, MD as Consulting Physician (Gastroenterology) Sanda Klein, MD as Consulting Physician (Cardiology)  Extended Emergency Contact Information Primary Emergency Contact: [name],Pat Address: [place] [address] [name] of [place] Phone: [phone] Mobile Phone: [phone] Relation: Daughter  Code Status:  DNR Goals of care: Advanced Directive information Advanced Directives 09/03/2016  Does Patient Have a Medical Advance Directive? Yes  Type of Paramedic of [place];Out of facility DNR (pink MOST or yellow form)  Does patient want to make changes to medical advance directive? No - Patient declined  Copy of [place] in Chart? Yes  Pre-existing out of facility DNR order (yellow form or pink MOST form) Yellow form placed in chart (order not valid for inpatient use)     Chief Complaint  Patient presents with  . Acute Visit    Patricia Nelson, Pneumonia    HPI:  Pt is a 83 y.o. female seen today for an acute visit for HR 108 bpm, irregular, sustained ST @ right elbow from falling 09/02/16 when she walked to the bathroom after diner, the patient has been treated for PNA, UTI with Levaquin, CHF with ankle edema and elevated BNP in 300s, 09/01/16 echocardiogram showed normal LV function, pulmonary hypertension. She is afebrile, no O2 desaturation, she has occasional non productive cough, she denied chest pain, nausea, vomiting, diarrhea, or dizziness     Past Medical History:  Diagnosis Date  . Anemia, unspecified   . Anorexia   . Anxiety   . Arthritis   . Atherosclerotic cerebrovascular disease   . Backache, unspecified    . Cataract   . Cholelithiasis   . Chronic kidney disease, unspecified   . Closed fracture of right inferior pubic ramus ([place]) 11/12/2013   11/10/13 s/p fall, ED eval  X-ray R hip  1. Possible nondisplaced right inferior pubic ramus fracture.  2. No femur fracture or dislocation   02/06/14 dc prn Motrin and Norco-not used    . Depression   . Diaphragmatic hernia without mention of obstruction or gangrene   . Diseases of lips 03/26/2013  . Diverticulosis of colon (without mention of hemorrhage)   . Hemorrhage of rectum and anus 03/26/2013  . Hyperlipidemia   . Hypertension   . Insomnia, unspecified   . Laceration of occipital scalp 11/12/2013  . Macular degeneration 06/28/2013  . Macular degeneration (senile) of retina, unspecified   . Osteoporosis   . Other malaise and fatigue   . Other sleep disturbances   . Other specified cardiac dysrhythmias(427.89)   . Other specified personal history presenting hazards to health(V15.89)   . Other symptoms involving cardiovascular system   . Pancreatitis   . Personal history of other diseases of circulatory system   . Personal history of other diseases of digestive system    pancreatitis from gallstones  . Personal history of other diseases of respiratory system   . Personal history of traumatic fracture   . Rosacea   . Senile osteoporosis    with old T11 fracture  . Thyroid disease   . Unspecified disorders of arteries and arterioles   . Unspecified hearing loss   . Unspecified hemorrhoids  without mention of complication    Past Surgical History:  Procedure Laterality Date  . ABDOMINAL HYSTERECTOMY     and BSO fibroids  . COLONOSCOPY  05/23/2007  . JOINT REPLACEMENT  1996  . TOTAL HIP ARTHROPLASTY Right     Allergies  Allergen Reactions  . Ace Inhibitors     cough  . Mirtazapine     Nightmares   . Penicillins Rash    Outpatient Encounter Prescriptions as of 09/03/2016  Medication Sig  . acetaminophen (TYLENOL) 325 MG tablet Take  650 mg by mouth every 6 (six) hours as needed.  Marland Kitchen acetaminophen (TYLENOL) 500 MG tablet Take 500 mg by mouth 2 (two) times daily.  Marland Kitchen amLODipine (NORVASC) 5 MG tablet Take 5 mg by mouth daily.  Marland Kitchen aspirin 81 MG tablet Take 81 mg by mouth daily.  Marland Kitchen BYSTOLIC 10 MG tablet Take one tablet daily for blood pressusre  . clindamycin (CLEOCIN) 150 MG capsule Take 4(1000) capsule by mouth 1 hr prior to dental appointment.  Marland Kitchen erythromycin ophthalmic ointment Place 1 application into the right eye at bedtime.  Marland Kitchen escitalopram (LEXAPRO) 5 MG tablet Take 5 mg by mouth daily.  Marland Kitchen levofloxacin (LEVAQUIN) 500 MG tablet Take 500 mg by mouth daily.  Marland Kitchen levothyroxine (SYNTHROID, LEVOTHROID) 25 MCG tablet Take 25 mcg by mouth daily before breakfast.   . Lidocaine (ASPERCREME LIDOCAINE) 4 % PTCH Apply topically. Apply patch every night(8:00pm)  to lower back, and remove at (8:00 am).  Marland Kitchen loperamide (IMODIUM A-D) 2 MG tablet Take 2 mg by mouth as needed for diarrhea or loose stools.   . memantine (NAMENDA XR) 28 MG CP24 24 hr capsule Take 28 mg by mouth.  . Nutritional Supplements (BENECALORIE PO) Take 1 Container by mouth daily.  . polyethylene glycol (MIRALAX / GLYCOLAX) packet Take 17 g by mouth every 3 (three) days.   . polyvinyl alcohol (LIQUIFILM TEARS) 1.4 % ophthalmic solution Place 1 drop into both eyes 4 (four) times daily.   Marland Kitchen saccharomyces boulardii (FLORASTOR) 250 MG capsule Take 250 mg by mouth 2 (two) times daily.   No facility-administered encounter medications on file as of 09/03/2016.     Review of Systems  Constitutional: Negative for chills and fever.  HENT: Positive for hearing loss. Negative for ear pain, nosebleeds and sore throat.   Eyes: Negative for pain, discharge and redness.       Right lower eye lid redness, chronic ectropion.   Respiratory: Negative for cough and shortness of breath.   Cardiovascular: Positive for leg swelling.       Ankles  Gastrointestinal: Negative for constipation,  diarrhea, nausea and vomiting.  Endocrine:       Hypothyroid  Genitourinary: Positive for frequency. Negative for dysuria, flank pain and urgency.  Musculoskeletal: Negative for back pain, myalgias and neck pain.       Ambulates with walker.   Skin: Positive for wound. Negative for rash.       Sustained skin laceration the right elbow from a fall on way to bathroom after diner 09/02/16  Neurological: Negative for tremors and weakness.  Hematological:       Hx anemia  Psychiatric/Behavioral: Positive for confusion. Negative for agitation, hallucinations and suicidal ideas. The patient is nervous/anxious.        Increased confusion.     Immunization History  Administered Date(s) Administered  . DTaP 10/12/2012  . Influenza-Unspecified 11/22/2012, 12/26/2013, 11/12/2014, 12/09/2015  . PPD Test 12/06/2013  . Pneumococcal Polysaccharide-23 09/22/2000  .  Td 07/08/2005  . Tdap 11/10/2013   Pertinent  Health Maintenance Due  Topic Date Due  . PNA vac Low Risk Adult (2 of 2 - PCV13) 02/22/2017 (Originally 09/22/2001)  . DEXA SCAN  08/19/2017 (Originally 01-25-201984)  . INFLUENZA VACCINE  09/22/2016   Fall Risk  01/13/2015 06/28/2013  Falls in the past year? No No  Risk for fall due to : Impaired balance/gait -   Functional Status Survey:    Vitals:   09/03/16 1142  BP: 130/80  Pulse: 99  Resp: (!) 22  Temp: 98.5 F (36.9 C)  SpO2: 94%  Weight: 119 lb 9.6 oz (54.3 kg)  Height: 5' (1.524 m)   Body mass index is 23.36 kg/m. Physical Exam  Constitutional: She appears well-developed and well-nourished. No distress.  HENT:  Head: Normocephalic and atraumatic.  Right Ear: External ear normal.  Left Ear: External ear normal.  Nose: Nose normal.  Mouth/Throat: Oropharynx is clear and moist. No oropharyngeal exudate.  Eyes: Pupils are equal, round, and reactive to light. Conjunctivae and EOM are normal. Right eye exhibits no discharge. Left eye exhibits no discharge. No scleral  icterus.  Right lower eye lid redness, chronic ectropion. A stye at the medial aspect of the right upper lid.  Neck: Normal range of motion. Neck supple.  Cardiovascular: Intact distal pulses.   Murmur (2/6/SEM) heard. Heart rate 108 bpm, irregular. 09/01/16 echocardiogram: mild aortic insufficiency, moderate mitral insufficiency, moderate TR/mold PI with  moderate pulmonary hypertension, normal LV function.   Pulmonary/Chest: Effort normal and breath sounds normal. No respiratory distress. She has no wheezes. She has no rales.  Abdominal: Soft. Bowel sounds are normal.  Musculoskeletal: Normal range of motion. She exhibits edema. She exhibits no tenderness.  Ankle edema, trace.   Neurological: She is alert. She has normal reflexes. No cranial nerve deficit. She exhibits abnormal muscle tone. Coordination normal.  Significant dementia. Holding doll.  Skin: Skin is warm. She is not diaphoretic.  Lemon sized Lentigo to left lower cheek. The right elbow skin tear is well approximated with steri-strips, no s/s of infection.   Psychiatric: She has a normal mood and affect. Her affect is not blunt, not labile and not inappropriate. Her speech is not delayed, not tangential and not slurred. She is not agitated, not aggressive, not hyperactive, not slowed, not withdrawn, not actively hallucinating and not combative. Thought content is paranoid. Thought content is not delusional. Cognition and memory are impaired. She expresses inappropriate judgment. She does not express impulsivity. She expresses no homicidal and no suicidal ideation. She expresses no suicidal plans and no homicidal plans. She is communicative. She exhibits abnormal recent memory and abnormal remote memory. She is attentive.    Labs reviewed:  Recent Labs  06/10/16 06/29/16 08/31/16  NA 141 141 142  K 3.5 5.1 4.2  BUN 24* 25* 30*  CREATININE 1.2* 1.2* 0.9    Recent Labs  06/10/16 06/29/16 08/31/16  AST 24 29 19   ALT 11 14 12     ALKPHOS 67 50 70    Recent Labs  06/10/16 06/29/16 08/31/16  WBC 5.3 4.8 4.6  HGB 12.3 11.8* 11.4*  HCT 35* 34* 34*  PLT 214 194 220   Lab Results  Component Value Date   TSH 1.68 06/01/2016   No results found for: HGBA1C No results found for: CHOL, HDL, LDLCALC, LDLDIRECT, TRIG, CHOLHDL  Significant Diagnostic Results in last 30 days:  No results found.  Assessment/Plan Tachycardia HR 108 bpm, irregular, sustained ST @  right elbow from falling 09/02/16 when she walked to the bathroom after diner, the patient has been treated for PNA, UTI with Levaquin, CHF with ankle edema and elevated BNP in 300s, 09/01/16 echocardiogram showed normal LV function. Will obtain EKG, observe for developing decompensation of CHF  Laceration of skin of right elbow Continue steri-strips until self exfoliated, observe for s/s of infection.   Congestive heart failure (CHF) (HCC) ankle edema and elevated BNP in 300s, 09/01/16 echocardiogram showed normal LV function, moderate pulmonary hypertension. Continue to monitor s/s of developing decompensation of CHF, may gentle diurese the patient as needed. Furosemide 20mg  qd Kcl 55meq qd x 3 days.   Fall Increased frailty in setting of PNA, UTI, CHF, close supervision needed for safety.   Edema No change, in ankles.   Pneumonia 09/01/16 CXR right lung apex infiltrate: Levaquin 500mg  x 10 days.  09/03/16 afebrile, no O2 desaturation, occasional cough, continue to observe the patient.   UTI (urinary tract infection) 09/02/16 E Coli >100,000c/ml, continue Levaquin.      Family/ staff Communication: SNF MCU  Labs/tests ordered:  EKG, BMP, PT/INR

## 2016-09-03 NOTE — Assessment & Plan Note (Signed)
Increased frailty in setting of PNA, UTI, CHF, close supervision needed for safety.

## 2016-09-03 NOTE — Assessment & Plan Note (Signed)
09/02/16 E Coli >100,000c/ml, continue Levaquin.

## 2016-09-03 NOTE — Assessment & Plan Note (Signed)
Continue steri-strips until self exfoliated, observe for s/s of infection.

## 2016-09-06 ENCOUNTER — Non-Acute Institutional Stay (SKILLED_NURSING_FACILITY): Payer: PPO | Admitting: Nurse Practitioner

## 2016-09-06 ENCOUNTER — Encounter: Payer: Self-pay | Admitting: Nurse Practitioner

## 2016-09-06 DIAGNOSIS — E039 Hypothyroidism, unspecified: Secondary | ICD-10-CM

## 2016-09-06 DIAGNOSIS — Z5181 Encounter for therapeutic drug level monitoring: Secondary | ICD-10-CM | POA: Diagnosis not present

## 2016-09-06 DIAGNOSIS — Z7901 Long term (current) use of anticoagulants: Secondary | ICD-10-CM | POA: Insufficient documentation

## 2016-09-06 DIAGNOSIS — I10 Essential (primary) hypertension: Secondary | ICD-10-CM

## 2016-09-06 DIAGNOSIS — I4891 Unspecified atrial fibrillation: Secondary | ICD-10-CM | POA: Diagnosis not present

## 2016-09-06 DIAGNOSIS — I509 Heart failure, unspecified: Secondary | ICD-10-CM

## 2016-09-06 NOTE — Assessment & Plan Note (Signed)
Heart rate is in control. Continue Bystolic 10mg , Amlodipine 5mg  po qd.

## 2016-09-06 NOTE — Assessment & Plan Note (Signed)
For Afib, INR 1.5 today,  Started Coumadin 5mg  po qd 09/03/16, will repeat PT/INR 09/09/16

## 2016-09-06 NOTE — Assessment & Plan Note (Signed)
Blood pressure is controlled. She is taking Amlodipine 5mg ,  Bystolic 10mg  daily for Bp.Takes ASA for cardiovascular risk reduction

## 2016-09-06 NOTE — Assessment & Plan Note (Signed)
Improved ankle edema, herelevated BNP in 300s, 09/01/16 echocardiogram showed normal LV function, moderate pulmonary hypertension. Continue to monitor s/s of developing decompensation of CHF, may gentle diurese the patient as needed. Furosemide 10mg  Kcl 60meq qd. BMP one week.

## 2016-09-06 NOTE — Assessment & Plan Note (Signed)
Continue Levothyroxine 69mcg daily. TSH 1.68 06/01/16, update TSH

## 2016-09-06 NOTE — Progress Notes (Signed)
Location:  [facility] Room Number: 107 Place of Service:  SNF (31) Provider:  Mast, Manxie  NP  Blanchie Serve, MD  Patient Care Team: Blanchie Serve, MD as PCP - General (Internal Medicine) Mast, Man X, NP as Nurse Practitioner (Nurse Practitioner) Wilford Corner, MD as Consulting Physician (Gastroenterology) Sanda Klein, MD as Consulting Physician (Cardiology)  Extended Emergency Contact Information Primary Emergency Contact: [name],Pat Address: [place] [address] [name] of [place] Phone: [phone] Mobile Phone: [phone] Relation: Daughter  Code Status:  DNR Goals of care: Advanced Directive information Advanced Directives 09/06/2016  Does Patient Have a Medical Advance Directive? Yes  Type of Paramedic of [place];Out of facility DNR (pink MOST or yellow form)  Does patient want to make changes to medical advance directive? No - Patient declined  Copy of [place] in Chart? Yes  Pre-existing out of facility DNR order (yellow form or pink MOST form) Yellow form placed in chart (order not valid for inpatient use)     Chief Complaint  Patient presents with  . Acute Visit    Medication changes-Coumadin    HPI:  Pt is a 83 y.o. female seen today for an acute visit for    Past Medical History:  Diagnosis Date  . Anemia, unspecified   . Anorexia   . Anxiety   . Arthritis   . Atherosclerotic cerebrovascular disease   . Backache, unspecified   . Cataract   . Cholelithiasis   . Chronic kidney disease, unspecified   . Closed fracture of right inferior pubic ramus ([place]) 11/12/2013   11/10/13 s/p fall, ED eval  X-ray R hip  1. Possible nondisplaced right inferior pubic ramus fracture.  2. No femur fracture or dislocation   02/06/14 dc prn Motrin and Norco-not used    . Depression   . Diaphragmatic hernia without mention of obstruction or gangrene   .  Diseases of lips 03/26/2013  . Diverticulosis of colon (without mention of hemorrhage)   . Hemorrhage of rectum and anus 03/26/2013  . Hyperlipidemia   . Hypertension   . Insomnia, unspecified   . Laceration of occipital scalp 11/12/2013  . Macular degeneration 06/28/2013  . Macular degeneration (senile) of retina, unspecified   . Osteoporosis   . Other malaise and fatigue   . Other sleep disturbances   . Other specified cardiac dysrhythmias(427.89)   . Other specified personal history presenting hazards to health(V15.89)   . Other symptoms involving cardiovascular system   . Pancreatitis   . Personal history of other diseases of circulatory system   . Personal history of other diseases of digestive system    pancreatitis from gallstones  . Personal history of other diseases of respiratory system   . Personal history of traumatic fracture   . Rosacea   . Senile osteoporosis    with old T11 fracture  . Thyroid disease   . Unspecified disorders of arteries and arterioles   . Unspecified hearing loss   . Unspecified hemorrhoids without mention of complication    Past Surgical History:  Procedure Laterality Date  . ABDOMINAL HYSTERECTOMY     and BSO fibroids  . COLONOSCOPY  05/23/2007  . JOINT REPLACEMENT  1996  . TOTAL HIP ARTHROPLASTY Right     Allergies  Allergen Reactions  . Ace Inhibitors     cough  . Mirtazapine     Nightmares   .  Penicillins Rash    Outpatient Encounter Prescriptions as of 09/06/2016  Medication Sig  . acetaminophen (TYLENOL) 325 MG tablet Take 650 mg by mouth every 6 (six) hours as needed.  Marland Kitchen acetaminophen (TYLENOL) 500 MG tablet Take 500 mg by mouth 2 (two) times daily.  Marland Kitchen amLODipine (NORVASC) 5 MG tablet Take 5 mg by mouth daily.  Marland Kitchen aspirin 81 MG tablet Take 81 mg by mouth daily.  Marland Kitchen BYSTOLIC 10 MG tablet Take one tablet daily for blood pressusre  . clindamycin (CLEOCIN) 150 MG capsule Take 4(1000) capsule by mouth 1 hr prior to dental appointment.    Marland Kitchen escitalopram (LEXAPRO) 5 MG tablet Take 5 mg by mouth daily.  . furosemide (LASIX) 20 MG tablet Take 10 mg by mouth daily.  Marland Kitchen levofloxacin (LEVAQUIN) 500 MG tablet Take 500 mg by mouth daily.  Marland Kitchen levothyroxine (SYNTHROID, LEVOTHROID) 25 MCG tablet Take 25 mcg by mouth daily before breakfast.   . Lidocaine (ASPERCREME LIDOCAINE) 4 % PTCH Apply topically. Apply patch every night(8:00pm)  to lower back, and remove at (8:00 am).  Marland Kitchen loperamide (IMODIUM A-D) 2 MG tablet Take 2 mg by mouth as needed for diarrhea or loose stools.   . memantine (NAMENDA XR) 28 MG CP24 24 hr capsule Take 28 mg by mouth.  . Nutritional Supplements (BENECALORIE PO) Take 1 Container by mouth daily.  . polyethylene glycol (MIRALAX / GLYCOLAX) packet Take 17 g by mouth every 3 (three) days.   . polyvinyl alcohol (LIQUIFILM TEARS) 1.4 % ophthalmic solution Place 1 drop into both eyes 4 (four) times daily.   . potassium chloride (K-DUR,KLOR-CON) 10 MEQ tablet Take 10 mEq by mouth daily.  Marland Kitchen saccharomyces boulardii (FLORASTOR) 250 MG capsule Take 250 mg by mouth 2 (two) times daily.  Marland Kitchen warfarin (COUMADIN) 5 MG tablet Take 5 mg by mouth daily.  . [DISCONTINUED] erythromycin ophthalmic ointment Place 1 application into the right eye at bedtime.   No facility-administered encounter medications on file as of 09/06/2016.     Review of Systems  Immunization History  Administered Date(s) Administered  . DTaP 10/12/2012  . Influenza-Unspecified 11/22/2012, 12/26/2013, 11/12/2014, 12/09/2015  . PPD Test 12/06/2013  . Pneumococcal Polysaccharide-23 09/22/2000  . Td 07/08/2005  . Tdap 11/10/2013   Pertinent  Health Maintenance Due  Topic Date Due  . PNA vac Low Risk Adult (2 of 2 - PCV13) 02/22/2017 (Originally 09/22/2001)  . DEXA SCAN  08/19/2017 (Originally 2019-05-2582)  . INFLUENZA VACCINE  09/22/2016   Fall Risk  01/13/2015 06/28/2013  Falls in the past year? No No  Risk for fall due to : Impaired balance/gait -    Functional Status Survey:    Vitals:   09/06/16 1325  BP: 130/80  Pulse: 99  Resp: 20  Temp: 98.5 F (36.9 C)  Weight: 119 lb 9.6 oz (54.3 kg)  Height: 5' (1.524 m)   Body mass index is 23.36 kg/m. Physical Exam  Labs reviewed:  Recent Labs  06/10/16 06/29/16 08/31/16  NA 141 141 142  K 3.5 5.1 4.2  BUN 24* 25* 30*  CREATININE 1.2* 1.2* 0.9    Recent Labs  06/10/16 06/29/16 08/31/16  AST 24 29 19   ALT 11 14 12   ALKPHOS 67 50 70    Recent Labs  06/10/16 06/29/16 08/31/16  WBC 5.3 4.8 4.6  HGB 12.3 11.8* 11.4*  HCT 35* 34* 34*  PLT 214 194 220   Lab Results  Component Value Date   TSH 1.68 06/01/2016  No results found for: HGBA1C No results found for: CHOL, HDL, LDLCALC, LDLDIRECT, TRIG, CHOLHDL  Significant Diagnostic Results in last 30 days:  No results found.  Assessment/Plan 1. Anticoagulation goal of INR 2 to 3   2. Atrial fibrillation, unspecified type ([place])   3. Essential hypertension   4. Congestive heart failure, unspecified HF chronicity, unspecified heart failure type ([place])   5. Acquired hypothyroidism     Family/ staff Communication:   Labs/tests ordered:

## 2016-09-06 NOTE — Progress Notes (Signed)
Location:  [facility] Room Number: 107 Place of Service:  SNF (31) Provider:  Joquan Lotz, [place]  NP  Blanchie Serve, MD  Patient Care Team: Blanchie Serve, MD as PCP - General (Internal Medicine) Allyn Bertoni X, NP as Nurse Practitioner (Nurse Practitioner) Wilford Corner, MD as Consulting Physician (Gastroenterology) Sanda Klein, MD as Consulting Physician (Cardiology)  Extended Emergency Contact Information Primary Emergency Contact: [name],Pat Address: [place] [address] [name] of [place] Phone: [phone] Mobile Phone: [phone] Relation: Daughter  Code Status:  DNR Goals of care: Advanced Directive information Advanced Directives 09/06/2016  Does Patient Have a Medical Advance Directive? Yes  Type of Paramedic of [place];Out of facility DNR (pink MOST or yellow form)  Does patient want to make changes to medical advance directive? No - Patient declined  Copy of [place] in Chart? Yes  Pre-existing out of facility DNR order (yellow form or pink MOST form) Yellow form placed in chart (order not valid for inpatient use)     Chief Complaint  Patient presents with  . Acute Visit    Medication changes-Coumadin    HPI:  Pt is a 83 y.o. female seen today for an acute visit for anticoagulation therapy for new onset Afib, heart rate is in control, irregular, started Coumadin 5mg  since 09/03/16, INR 1.5 today.  The patient has been treated for PNA, UTI with Levaquin, CHF with ankle edema, improved after 3 day course of Furosemide 20mg  qd, her BNP was in 300s, 09/01/16 echocardiogram showed normal LV function, pulmonary hypertension. She is afebrile, no O2 desaturation, she has occasional non productive cough, she denied chest pain, nausea, vomiting, diarrhea, or dizziness   Hx of hypothyroidism, last TSH 1.68 06/01/16, on Levothyroxine 32mcg daily, HTN blood pressure is  controlled on Bystolic 10mg  and Amlodipine 5mg  qd.     Past Medical History:  Diagnosis Date  . Anemia, unspecified   . Anorexia   . Anxiety   . Arthritis   . Atherosclerotic cerebrovascular disease   . Backache, unspecified   . Cataract   . Cholelithiasis   . Chronic kidney disease, unspecified   . Closed fracture of right inferior pubic ramus ([place]) 11/12/2013   11/10/13 s/p fall, ED eval  X-ray R hip  1. Possible nondisplaced right inferior pubic ramus fracture.  2. No femur fracture or dislocation   02/06/14 dc prn Motrin and Norco-not used    . Depression   . Diaphragmatic hernia without mention of obstruction or gangrene   . Diseases of lips 03/26/2013  . Diverticulosis of colon (without mention of hemorrhage)   . Hemorrhage of rectum and anus 03/26/2013  . Hyperlipidemia   . Hypertension   . Insomnia, unspecified   . Laceration of occipital scalp 11/12/2013  . Macular degeneration 06/28/2013  . Macular degeneration (senile) of retina, unspecified   . Osteoporosis   . Other malaise and fatigue   . Other sleep disturbances   . Other specified cardiac dysrhythmias(427.89)   . Other specified personal history presenting hazards to health(V15.89)   . Other symptoms involving cardiovascular system   . Pancreatitis   . Personal history of other diseases of circulatory system   . Personal history of other diseases of digestive system    pancreatitis from gallstones  . Personal history of other diseases of respiratory system   . Personal history of traumatic fracture   . Rosacea   .  Senile osteoporosis    with old T11 fracture  . Thyroid disease   . Unspecified disorders of arteries and arterioles   . Unspecified hearing loss   . Unspecified hemorrhoids without mention of complication    Past Surgical History:  Procedure Laterality Date  . ABDOMINAL HYSTERECTOMY     and BSO fibroids  . COLONOSCOPY  05/23/2007  . JOINT REPLACEMENT  1996  . TOTAL HIP ARTHROPLASTY Right      Allergies  Allergen Reactions  . Ace Inhibitors     cough  . Mirtazapine     Nightmares   . Penicillins Rash    Outpatient Encounter Prescriptions as of 09/06/2016  Medication Sig  . acetaminophen (TYLENOL) 325 MG tablet Take 650 mg by mouth every 6 (six) hours as needed.  Marland Kitchen acetaminophen (TYLENOL) 500 MG tablet Take 500 mg by mouth 2 (two) times daily.  Marland Kitchen amLODipine (NORVASC) 5 MG tablet Take 5 mg by mouth daily.  Marland Kitchen aspirin 81 MG tablet Take 81 mg by mouth daily.  Marland Kitchen BYSTOLIC 10 MG tablet Take one tablet daily for blood pressusre  . clindamycin (CLEOCIN) 150 MG capsule Take 4(1000) capsule by mouth 1 hr prior to dental appointment.  Marland Kitchen escitalopram (LEXAPRO) 5 MG tablet Take 5 mg by mouth daily.  . furosemide (LASIX) 20 MG tablet Take 10 mg by mouth daily.  Marland Kitchen levofloxacin (LEVAQUIN) 500 MG tablet Take 500 mg by mouth daily.  Marland Kitchen levothyroxine (SYNTHROID, LEVOTHROID) 25 MCG tablet Take 25 mcg by mouth daily before breakfast.   . Lidocaine (ASPERCREME LIDOCAINE) 4 % PTCH Apply topically. Apply patch every night(8:00pm)  to lower back, and remove at (8:00 am).  Marland Kitchen loperamide (IMODIUM A-D) 2 MG tablet Take 2 mg by mouth as needed for diarrhea or loose stools.   . memantine (NAMENDA XR) 28 MG CP24 24 hr capsule Take 28 mg by mouth.  . Nutritional Supplements (BENECALORIE PO) Take 1 Container by mouth daily.  . polyethylene glycol (MIRALAX / GLYCOLAX) packet Take 17 g by mouth every 3 (three) days.   . polyvinyl alcohol (LIQUIFILM TEARS) 1.4 % ophthalmic solution Place 1 drop into both eyes 4 (four) times daily.   . potassium chloride (K-DUR,KLOR-CON) 10 MEQ tablet Take 10 mEq by mouth daily.  Marland Kitchen saccharomyces boulardii (FLORASTOR) 250 MG capsule Take 250 mg by mouth 2 (two) times daily.  Marland Kitchen warfarin (COUMADIN) 5 MG tablet Take 5 mg by mouth daily.  . [DISCONTINUED] erythromycin ophthalmic ointment Place 1 application into the right eye at bedtime.   No facility-administered encounter  medications on file as of 09/06/2016.     Review of Systems  Constitutional: Negative for chills and fever.  HENT: Positive for hearing loss. Negative for ear pain, nosebleeds and sore throat.   Eyes: Negative for pain, discharge and redness.       Right lower eye lid redness, chronic ectropion.   Respiratory: Negative for cough and shortness of breath.   Cardiovascular: Positive for leg swelling.       Ankles, improved  Gastrointestinal: Negative for constipation, diarrhea, nausea and vomiting.  Endocrine:       Hypothyroid  Genitourinary: Positive for frequency. Negative for dysuria, flank pain and urgency.  Musculoskeletal: Negative for back pain, myalgias and neck pain.       Ambulates with walker.   Skin: Positive for wound. Negative for rash.       Sustained skin laceration the right elbow from a fall on way to bathroom after diner  09/02/16  Neurological: Negative for tremors and weakness.  Hematological:       Hx anemia  Psychiatric/Behavioral: Positive for confusion. Negative for agitation, hallucinations and suicidal ideas. The patient is nervous/anxious.        Increased confusion.     Immunization History  Administered Date(s) Administered  . DTaP 10/12/2012  . Influenza-Unspecified 11/22/2012, 12/26/2013, 11/12/2014, 12/09/2015  . PPD Test 12/06/2013  . Pneumococcal Polysaccharide-23 09/22/2000  . Td 07/08/2005  . Tdap 11/10/2013   Pertinent  Health Maintenance Due  Topic Date Due  . PNA vac Low Risk Adult (2 of 2 - PCV13) 02/22/2017 (Originally 09/22/2001)  . DEXA SCAN  08/19/2017 (Originally Jul 20, 201984)  . INFLUENZA VACCINE  09/22/2016   Fall Risk  01/13/2015 06/28/2013  Falls in the past year? No No  Risk for fall due to : Impaired balance/gait -   Functional Status Survey:    Vitals:   09/06/16 1325  BP: 130/80  Pulse: 99  Resp: 20  Temp: 98.5 F (36.9 C)  Weight: 119 lb 9.6 oz (54.3 kg)  Height: 5' (1.524 m)   Body mass index is 23.36  kg/m. Physical Exam  Constitutional: She appears well-developed and well-nourished. No distress.  HENT:  Head: Normocephalic and atraumatic.  Right Ear: External ear normal.  Left Ear: External ear normal.  Nose: Nose normal.  Mouth/Throat: Oropharynx is clear and moist. No oropharyngeal exudate.  Eyes: Pupils are equal, round, and reactive to light. Conjunctivae and EOM are normal. Right eye exhibits no discharge. Left eye exhibits no discharge. No scleral icterus.  Right lower eye lid redness, chronic ectropion. A stye at the medial aspect of the right upper lid.  Neck: Normal range of motion. Neck supple.  Cardiovascular: Intact distal pulses.   Murmur (2/6/SEM) heard. Heart rate 108 bpm, irregular. 09/01/16 echocardiogram: mild aortic insufficiency, moderate mitral insufficiency, moderate TR/mold PI with  moderate pulmonary hypertension, normal LV function.   Pulmonary/Chest: Effort normal and breath sounds normal. No respiratory distress. She has no wheezes. She has no rales.  Abdominal: Soft. Bowel sounds are normal.  Musculoskeletal: Normal range of motion. She exhibits edema. She exhibits no tenderness.  Ankle edema, trace, improved.   Neurological: She is alert. She has normal reflexes. No cranial nerve deficit. She exhibits abnormal muscle tone. Coordination normal.  Significant dementia. Holding doll.  Skin: Skin is warm. She is not diaphoretic.  Lemon sized Lentigo to left lower cheek. The right elbow skin tear is well approximated with steri-strips, no s/s of infection.   Psychiatric: She has a normal mood and affect. Her affect is not blunt, not labile and not inappropriate. Her speech is not delayed, not tangential and not slurred. She is not agitated, not aggressive, not hyperactive, not slowed, not withdrawn, not actively hallucinating and not combative. Thought content is paranoid. Thought content is not delusional. Cognition and memory are impaired. She expresses  inappropriate judgment. She does not express impulsivity. She expresses no homicidal and no suicidal ideation. She expresses no suicidal plans and no homicidal plans. She is communicative. She exhibits abnormal recent memory and abnormal remote memory. She is attentive.    Labs reviewed:  Recent Labs  06/10/16 06/29/16 08/31/16  NA 141 141 142  K 3.5 5.1 4.2  BUN 24* 25* 30*  CREATININE 1.2* 1.2* 0.9    Recent Labs  06/10/16 06/29/16 08/31/16  AST 24 29 19   ALT 11 14 12   ALKPHOS 67 50 70    Recent Labs  06/10/16 06/29/16  08/31/16  WBC 5.3 4.8 4.6  HGB 12.3 11.8* 11.4*  HCT 35* 34* 34*  PLT 214 194 220   Lab Results  Component Value Date   TSH 1.68 06/01/2016   No results found for: HGBA1C No results found for: CHOL, HDL, LDLCALC, LDLDIRECT, TRIG, CHOLHDL  Significant Diagnostic Results in last 30 days:  No results found.  Assessment/Plan Anticoagulation goal of INR 2 to 3 For Afib, INR 1.5 today,  Started Coumadin 5mg  po qd 09/03/16, will repeat PT/INR 09/09/16  A-fib ([place]) Heart rate is in control. Continue Bystolic 10mg , Amlodipine 5mg  po qd.   HTN (hypertension) Blood pressure is controlled. She is taking Amlodipine 5mg ,  Bystolic 10mg  daily for Bp.Takes ASA for cardiovascular risk reduction  Congestive heart failure (CHF) (HCC) Improved ankle edema, herelevated BNP in 300s, 09/01/16 echocardiogram showed normal LV function, moderate pulmonary hypertension. Continue to monitor s/s of developing decompensation of CHF, may gentle diurese the patient as needed. Furosemide 10mg  Kcl 66meq qd. BMP one week.   Hypothyroidism Continue Levothyroxine 81mcg daily. TSH 1.68 06/01/16, update TSH     Family/ staff Communication: SNF [facility]  Labs/tests ordered:  PT/INR, BMP, TSH

## 2016-09-07 ENCOUNTER — Other Ambulatory Visit: Payer: Self-pay | Admitting: *Deleted

## 2016-09-07 DIAGNOSIS — I10 Essential (primary) hypertension: Secondary | ICD-10-CM | POA: Diagnosis not present

## 2016-09-07 LAB — BASIC METABOLIC PANEL
BUN: 36 — AB (ref 4–21)
BUN: 36 — AB (ref 4–21)
CREATININE: 1.3 — AB (ref <=1.1)
GLUCOSE: 90
Potassium: 4.1 (ref 3.4–5.3)
SODIUM: 143 (ref 137–147)

## 2016-09-08 ENCOUNTER — Other Ambulatory Visit: Payer: Self-pay | Admitting: *Deleted

## 2016-09-09 DIAGNOSIS — I4891 Unspecified atrial fibrillation: Secondary | ICD-10-CM | POA: Diagnosis not present

## 2016-09-13 ENCOUNTER — Encounter: Payer: Self-pay | Admitting: Nurse Practitioner

## 2016-09-13 ENCOUNTER — Non-Acute Institutional Stay (SKILLED_NURSING_FACILITY): Payer: PPO | Admitting: Nurse Practitioner

## 2016-09-13 DIAGNOSIS — Z7901 Long term (current) use of anticoagulants: Secondary | ICD-10-CM | POA: Diagnosis not present

## 2016-09-13 DIAGNOSIS — S51011A Laceration without foreign body of right elbow, initial encounter: Secondary | ICD-10-CM | POA: Diagnosis not present

## 2016-09-13 DIAGNOSIS — Z5181 Encounter for therapeutic drug level monitoring: Secondary | ICD-10-CM

## 2016-09-13 DIAGNOSIS — H02132 Senile ectropion of right lower eyelid: Secondary | ICD-10-CM

## 2016-09-13 LAB — PROTIME-INR: Protime: 1.7 — AB (ref 10.0–13.8)

## 2016-09-13 NOTE — Assessment & Plan Note (Signed)
Afib 09/09/16 INR 4.8, hold Coumadin x 4 days, then repeat PT/INR 09/13/16 INR 1.7, resume Coumadin 2mg  qd, PT/INR 09/17/16

## 2016-09-13 NOTE — Assessment & Plan Note (Signed)
Not healed, no s/s of infection, continue ABT ointment and non adhesive dressing, it should heal. Observe.

## 2016-09-13 NOTE — Progress Notes (Signed)
Location:  [facility] Room Number: 107 Place of Service:  SNF (31) Provider:  Aryeh Butterfield,  [place]  NP  Blanchie Serve, MD  Patient Care Team: Blanchie Serve, MD as PCP - General (Internal Medicine) Asmaa Tirpak X, NP as Nurse Practitioner (Nurse Practitioner) Wilford Corner, MD as Consulting Physician (Gastroenterology) Sanda Klein, MD as Consulting Physician (Cardiology)  Extended Emergency Contact Information Primary Emergency Contact: [name],Pat Address: [place] [address] [name] of [place] Phone: [phone] Mobile Phone: [phone] Relation: Daughter  Code Status:  DNR Goals of care: Advanced Directive information Advanced Directives 09/13/2016  Does Patient Have a Medical Advance Directive? Yes  Type of Paramedic of [place];Out of facility DNR (pink MOST or yellow form)  Does patient want to make changes to medical advance directive? No - Patient declined  Copy of [place] in Chart? Yes  Pre-existing out of facility DNR order (yellow form or pink MOST form) Yellow form placed in chart (order not valid for inpatient use)     Chief Complaint  Patient presents with  . Acute Visit    Coumadin management    HPI:  Pt is a 83 y.o. female seen today for an acute visit for anticoagulation therapy management, her Coumadin was held due to supra therapeutic INR and elective right lower eyelid ec tropion repair surgery. Her eye lid surgery was canceled due to insurance concern.  Skin tear sustained from a fall @ right elbow, no s/s of infection, but not healed, current is treated with ABT ointment and non adhesive dressing.    Past Medical History:  Diagnosis Date  . Anemia, unspecified   . Anorexia   . Anxiety   . Arthritis   . Atherosclerotic cerebrovascular disease   . Backache, unspecified   . Cataract   . Cholelithiasis   . Chronic kidney disease, unspecified     . Closed fracture of right inferior pubic ramus ([place]) 11/12/2013   11/10/13 s/p fall, ED eval  X-ray R hip  1. Possible nondisplaced right inferior pubic ramus fracture.  2. No femur fracture or dislocation   02/06/14 dc prn Motrin and Norco-not used    . Depression   . Diaphragmatic hernia without mention of obstruction or gangrene   . Diseases of lips 03/26/2013  . Diverticulosis of colon (without mention of hemorrhage)   . Hemorrhage of rectum and anus 03/26/2013  . Hyperlipidemia   . Hypertension   . Insomnia, unspecified   . Laceration of occipital scalp 11/12/2013  . Macular degeneration 06/28/2013  . Macular degeneration (senile) of retina, unspecified   . Osteoporosis   . Other malaise and fatigue   . Other sleep disturbances   . Other specified cardiac dysrhythmias(427.89)   . Other specified personal history presenting hazards to health(V15.89)   . Other symptoms involving cardiovascular system   . Pancreatitis   . Personal history of other diseases of circulatory system   . Personal history of other diseases of digestive system    pancreatitis from gallstones  . Personal history of other diseases of respiratory system   . Personal history of traumatic fracture   . Rosacea   . Senile osteoporosis    with old T11 fracture  . Thyroid disease   . Unspecified disorders of arteries and arterioles   . Unspecified hearing loss   . Unspecified hemorrhoids without mention of complication    Past Surgical  History:  Procedure Laterality Date  . ABDOMINAL HYSTERECTOMY     and BSO fibroids  . COLONOSCOPY  05/23/2007  . JOINT REPLACEMENT  1996  . TOTAL HIP ARTHROPLASTY Right     Allergies  Allergen Reactions  . Ace Inhibitors     cough  . Mirtazapine     Nightmares   . Penicillins Rash    Outpatient Encounter Prescriptions as of 09/13/2016  Medication Sig  . acetaminophen (TYLENOL) 325 MG tablet Take 650 mg by mouth every 6 (six) hours as needed.  Marland Kitchen acetaminophen (TYLENOL)  500 MG tablet Take 500 mg by mouth 2 (two) times daily.  Marland Kitchen amLODipine (NORVASC) 5 MG tablet Take 5 mg by mouth daily.  Marland Kitchen aspirin 81 MG tablet Take 81 mg by mouth daily.  Marland Kitchen BYSTOLIC 10 MG tablet Take one tablet daily for blood pressusre  . clindamycin (CLEOCIN) 150 MG capsule Take 4(1000) capsule by mouth 1 hr prior to dental appointment.  Marland Kitchen escitalopram (LEXAPRO) 5 MG tablet Take 5 mg by mouth daily.  . furosemide (LASIX) 20 MG tablet Take 10 mg by mouth daily.  Marland Kitchen levothyroxine (SYNTHROID, LEVOTHROID) 25 MCG tablet Take 25 mcg by mouth daily before breakfast.   . Lidocaine (ASPERCREME LIDOCAINE) 4 % PTCH Apply topically. Apply patch every night(8:00pm)  to lower back, and remove at (8:00 am).  Marland Kitchen loperamide (IMODIUM A-D) 2 MG tablet Take 2 mg by mouth as needed for diarrhea or loose stools.   . memantine (NAMENDA XR) 28 MG CP24 24 hr capsule Take 28 mg by mouth.  . Nutritional Supplements (BENECALORIE PO) Take 1 Container by mouth daily.  . polyethylene glycol (MIRALAX / GLYCOLAX) packet Take 17 g by mouth every 3 (three) days.   . polyvinyl alcohol (LIQUIFILM TEARS) 1.4 % ophthalmic solution Place 1 drop into both eyes 4 (four) times daily.   . potassium chloride (K-DUR,KLOR-CON) 10 MEQ tablet Take 10 mEq by mouth daily.  Marland Kitchen saccharomyces boulardii (FLORASTOR) 250 MG capsule Take 250 mg by mouth 2 (two) times daily.  Marland Kitchen warfarin (COUMADIN) 2 MG tablet Take 2 mg by mouth daily.  . [DISCONTINUED] warfarin (COUMADIN) 5 MG tablet Take 5 mg by mouth daily.   No facility-administered encounter medications on file as of 09/13/2016.     Review of Systems  Constitutional: Negative for chills and fever.  HENT: Positive for hearing loss. Negative for nosebleeds.   Eyes: Negative for pain, discharge and redness.       Right lower eye lid redness, chronic ectropion.   Respiratory: Negative for cough and shortness of breath.   Cardiovascular: Positive for leg swelling.       Ankles, improved    Gastrointestinal: Negative for constipation and nausea.  Endocrine:       Hypothyroid  Genitourinary: Positive for frequency. Negative for dysuria.  Musculoskeletal: Negative for back pain.       Ambulates with walker.   Skin: Positive for wound. Negative for rash.       Sustained skin laceration the right elbow from a fall on way to bathroom after diner 09/02/16, no s/s of infection.   Neurological: Negative for tremors.  Hematological:       Hx anemia  Psychiatric/Behavioral: Positive for confusion. Negative for agitation and hallucinations. The patient is not nervous/anxious.        Confusion.     Immunization History  Administered Date(s) Administered  . DTaP 10/12/2012  . Influenza-Unspecified 11/22/2012, 12/26/2013, 11/12/2014, 12/09/2015  . PPD Test 12/06/2013  .  Pneumococcal Polysaccharide-23 09/22/2000  . Td 07/08/2005  . Tdap 11/10/2013   Pertinent  Health Maintenance Due  Topic Date Due  . PNA vac Low Risk Adult (2 of 2 - PCV13) 02/22/2017 (Originally 09/22/2001)  . DEXA SCAN  08/19/2017 (Originally Feb 01, 201984)  . INFLUENZA VACCINE  09/22/2016   Fall Risk  01/13/2015 06/28/2013  Falls in the past year? No No  Risk for fall due to : Impaired balance/gait -   Functional Status Survey:    Vitals:   09/13/16 1409  BP: 126/68  Pulse: 68  Resp: (!) 22  Temp: 98.5 F (36.9 C)  SpO2: 94%  Weight: 119 lb 9.6 oz (54.3 kg)   Body mass index is 23.36 kg/m. Physical Exam  Constitutional: She appears well-developed and well-nourished.  HENT:  Head: Normocephalic and atraumatic.  Left Ear: External ear normal.  Eyes: Pupils are equal, round, and reactive to light. Conjunctivae and EOM are normal.  Right lower eye lid redness, chronic ectropion  Neck: Normal range of motion. Neck supple.  Cardiovascular: Intact distal pulses.   Murmur (2/6/SEM) heard. Heart rate 108 bpm, irregular. 09/01/16 echocardiogram: mild aortic insufficiency, moderate mitral insufficiency,  moderate TR/mold PI with  moderate pulmonary hypertension, normal LV function.   Pulmonary/Chest: Effort normal and breath sounds normal. No respiratory distress.  Abdominal: Soft. Bowel sounds are normal.  Musculoskeletal: Normal range of motion. She exhibits edema. She exhibits no tenderness.  Ankle edema, trace, improved.   Neurological: She is alert. She has normal reflexes. No cranial nerve deficit. She exhibits normal muscle tone. Coordination normal.  Significant dementia. Holding doll.  Skin: Skin is warm.  Lemon sized Lentigo to left lower cheek. The right elbow skin tear is well approximated with steri-strips, no s/s of infection.   Psychiatric: She has a normal mood and affect. Her affect is not blunt, not labile and not inappropriate. Her speech is not delayed, not tangential and not slurred. She is not agitated, not aggressive, not hyperactive, not slowed, not withdrawn, not actively hallucinating and not combative. Thought content is paranoid. Thought content is not delusional. Cognition and memory are impaired. She expresses inappropriate judgment. She does not express impulsivity. She expresses no homicidal and no suicidal ideation. She expresses no suicidal plans and no homicidal plans. She is communicative. She exhibits abnormal recent memory and abnormal remote memory. She is attentive.    Labs reviewed:  Recent Labs  06/29/16 08/31/16 09/07/16  NA 141 142 143  K 5.1 4.2 4.1  BUN 25* 30* 36*  36*  CREATININE 1.2* 0.9 1.3*    Recent Labs  06/10/16 06/29/16 08/31/16  AST 24 29 19   ALT 11 14 12   ALKPHOS 67 50 70    Recent Labs  06/10/16 06/29/16 08/31/16  WBC 5.3 4.8 4.6  HGB 12.3 11.8* 11.4*  HCT 35* 34* 34*  PLT 214 194 220   Lab Results  Component Value Date   TSH 1.68 06/01/2016   No results found for: HGBA1C No results found for: CHOL, HDL, LDLCALC, LDLDIRECT, TRIG, CHOLHDL  Significant Diagnostic Results in last 30 days:  No results  found.  Assessment/Plan Ectropion of right lower eyelid Surgical repair is canceled due to insurance non coverage issue.   Anticoagulation goal of INR 2 to 3 Afib 09/09/16 INR 4.8, hold Coumadin x 4 days, then repeat PT/INR 09/13/16 INR 1.7, resume Coumadin 2mg  qd, PT/INR 09/17/16  Laceration of skin of right elbow Not healed, no s/s of infection, continue ABT ointment and non  adhesive dressing, it should heal. Observe.      Family/ staff Communication: SNF, plan of care discussed with POA and charge nurse.   Labs/tests ordered:  PT/INR 09/17/16

## 2016-09-13 NOTE — Assessment & Plan Note (Signed)
Surgical repair is canceled due to insurance non coverage issue.

## 2016-09-14 DIAGNOSIS — Z79899 Other long term (current) drug therapy: Secondary | ICD-10-CM | POA: Diagnosis not present

## 2016-09-14 DIAGNOSIS — R609 Edema, unspecified: Secondary | ICD-10-CM | POA: Diagnosis not present

## 2016-09-14 LAB — TSH: TSH: 0.92 (ref <=5.90)

## 2016-09-14 LAB — BASIC METABOLIC PANEL
BUN: 39 — AB (ref 4–21)
Creatinine: 1.4 — AB (ref <=1.1)
GLUCOSE: 81
POTASSIUM: 3.8 (ref 3.4–5.3)
SODIUM: 143 (ref 137–147)

## 2016-09-15 ENCOUNTER — Other Ambulatory Visit: Payer: Self-pay | Admitting: *Deleted

## 2016-09-21 ENCOUNTER — Non-Acute Institutional Stay (SKILLED_NURSING_FACILITY): Payer: PPO | Admitting: Nurse Practitioner

## 2016-09-21 ENCOUNTER — Encounter: Payer: Self-pay | Admitting: Nurse Practitioner

## 2016-09-21 DIAGNOSIS — Z01818 Encounter for other preprocedural examination: Secondary | ICD-10-CM | POA: Insufficient documentation

## 2016-09-21 DIAGNOSIS — I509 Heart failure, unspecified: Secondary | ICD-10-CM | POA: Diagnosis not present

## 2016-09-21 DIAGNOSIS — Z7901 Long term (current) use of anticoagulants: Secondary | ICD-10-CM

## 2016-09-21 DIAGNOSIS — I480 Paroxysmal atrial fibrillation: Secondary | ICD-10-CM

## 2016-09-21 DIAGNOSIS — Z5181 Encounter for therapeutic drug level monitoring: Secondary | ICD-10-CM

## 2016-09-21 NOTE — Assessment & Plan Note (Addendum)
Heart rate is in not in control. Continue Bystolic 10mg , dc Amlodipine 5mg  po qd, adding diltiazepm 30mg  po qid for better heart rate control. Monitor AP/BP q shift.

## 2016-09-21 NOTE — Progress Notes (Signed)
Location:  [facility] Room Number: 107 Place of Service:  SNF (31) Provider:  Lenay Lovejoy, [place]  NP  Blanchie Serve, MD  Patient Care Team: Blanchie Serve, MD as PCP - General (Internal Medicine) Floy Riegler X, NP as Nurse Practitioner (Nurse Practitioner) Wilford Corner, MD as Consulting Physician (Gastroenterology) Sanda Klein, MD as Consulting Physician (Cardiology)  Extended Emergency Contact Information Primary Emergency Contact: [name],Pat Address: [place] [address] [name] of [place] Phone: [phone] Mobile Phone: [phone] Relation: Daughter  Code Status:  DNR Goals of care: Advanced Directive information Advanced Directives 09/21/2016  Does Patient Have a Medical Advance Directive? Yes  Type of Paramedic of [place];Out of facility DNR (pink MOST or yellow form)  Does patient want to make changes to medical advance directive? No - Patient declined  Copy of [place] in Chart? Yes  Pre-existing out of facility DNR order (yellow form or pink MOST form) Yellow form placed in chart (order not valid for inpatient use)     Chief Complaint  Patient presents with  . Acute Visit    eye surgery- coumadin orders    HPI:  Pt is a 83 y.o. female seen today for an acute visit for plan of R lower blepharoplasty surgery next Monday 09/27/16. The patient is taking Coumadin and ASA 81mg  for Afib, her heart rate is not in control today, HR 110s, irregular, she admitted not feeling well, but denied chest pain, palpitation, SOB, cough, sputum production, she is ambulating with walker as usual, no O2 desaturation, she is afebrile. Recent Hx of CHF, takes Furosemide, her left ankle has trace edema today.    Past Medical History:  Diagnosis Date  . Anemia, unspecified   . Anorexia   . Anxiety   . Arthritis   . Atherosclerotic cerebrovascular disease   . Backache, unspecified     . Cataract   . Cholelithiasis   . Chronic kidney disease, unspecified   . Closed fracture of right inferior pubic ramus ([place]) 11/12/2013   11/10/13 s/p fall, ED eval  X-ray R hip  1. Possible nondisplaced right inferior pubic ramus fracture.  2. No femur fracture or dislocation   02/06/14 dc prn Motrin and Norco-not used    . Depression   . Diaphragmatic hernia without mention of obstruction or gangrene   . Diseases of lips 03/26/2013  . Diverticulosis of colon (without mention of hemorrhage)   . Hemorrhage of rectum and anus 03/26/2013  . Hyperlipidemia   . Hypertension   . Insomnia, unspecified   . Laceration of occipital scalp 11/12/2013  . Macular degeneration 06/28/2013  . Macular degeneration (senile) of retina, unspecified   . Osteoporosis   . Other malaise and fatigue   . Other sleep disturbances   . Other specified cardiac dysrhythmias(427.89)   . Other specified personal history presenting hazards to health(V15.89)   . Other symptoms involving cardiovascular system   . Pancreatitis   . Personal history of other diseases of circulatory system   . Personal history of other diseases of digestive system    pancreatitis from gallstones  . Personal history of other diseases of respiratory system   . Personal history of traumatic fracture   . Rosacea   . Senile osteoporosis    with old T11 fracture  . Thyroid disease   . Unspecified disorders of arteries and arterioles   . Unspecified hearing loss   .  Unspecified hemorrhoids without mention of complication    Past Surgical History:  Procedure Laterality Date  . ABDOMINAL HYSTERECTOMY     and BSO fibroids  . COLONOSCOPY  05/23/2007  . JOINT REPLACEMENT  1996  . TOTAL HIP ARTHROPLASTY Right     Allergies  Allergen Reactions  . Ace Inhibitors     cough  . Mirtazapine     Nightmares   . Penicillins Rash    Outpatient Encounter Prescriptions as of 09/21/2016  Medication Sig  . acetaminophen (TYLENOL) 325 MG tablet Take  650 mg by mouth every 6 (six) hours as needed.  Marland Kitchen acetaminophen (TYLENOL) 500 MG tablet Take 500 mg by mouth 2 (two) times daily.  Marland Kitchen amLODipine (NORVASC) 5 MG tablet Take 5 mg by mouth daily.  Marland Kitchen aspirin 81 MG tablet Take 81 mg by mouth daily.  Marland Kitchen BYSTOLIC 10 MG tablet Take one tablet daily for blood pressusre  . clindamycin (CLEOCIN) 150 MG capsule Take 4(1000) capsule by mouth 1 hr prior to dental appointment.  Marland Kitchen escitalopram (LEXAPRO) 5 MG tablet Take 5 mg by mouth daily.  . furosemide (LASIX) 20 MG tablet Take 10 mg by mouth daily.  Marland Kitchen levothyroxine (SYNTHROID, LEVOTHROID) 25 MCG tablet Take 25 mcg by mouth daily before breakfast.   . Lidocaine (ASPERCREME LIDOCAINE) 4 % PTCH Apply topically. Apply patch every night(8:00pm)  to lower back, and remove at (8:00 am).  Marland Kitchen loperamide (IMODIUM A-D) 2 MG tablet Take 2 mg by mouth as needed for diarrhea or loose stools.   . memantine (NAMENDA XR) 28 MG CP24 24 hr capsule Take 28 mg by mouth.  . Nutritional Supplements (BENECALORIE PO) Take 1 Container by mouth daily.  . polyethylene glycol (MIRALAX / GLYCOLAX) packet Take 17 g by mouth every 3 (three) days.   . polyvinyl alcohol (LIQUIFILM TEARS) 1.4 % ophthalmic solution Place 1 drop into both eyes 4 (four) times daily.   . potassium chloride (K-DUR,KLOR-CON) 10 MEQ tablet Take 10 mEq by mouth daily.  Marland Kitchen saccharomyces boulardii (FLORASTOR) 250 MG capsule Take 250 mg by mouth 2 (two) times daily.  Marland Kitchen warfarin (COUMADIN) 2 MG tablet Take 2 mg by mouth daily.   No facility-administered encounter medications on file as of 09/21/2016.     Review of Systems  Constitutional: Negative for chills and fever.  HENT: Positive for hearing loss. Negative for nosebleeds.   Eyes: Negative for pain, discharge and redness.       Right lower eye lid redness, chronic ectropion.   Respiratory: Negative for cough and shortness of breath.   Cardiovascular: Positive for leg swelling.       Trace edema left ankle.     Gastrointestinal: Negative for constipation and nausea.  Endocrine:       Hypothyroid  Genitourinary: Positive for frequency. Negative for dysuria.  Musculoskeletal: Negative for back pain.       Ambulates with walker.   Skin: Negative for rash and wound.  Neurological: Negative for tremors.  Hematological:       Hx anemia  Psychiatric/Behavioral: Positive for confusion. Negative for agitation and hallucinations. The patient is not nervous/anxious.        Confusion.     Immunization History  Administered Date(s) Administered  . DTaP 10/12/2012  . Influenza-Unspecified 11/22/2012, 12/26/2013, 11/12/2014, 12/09/2015  . PPD Test 12/06/2013  . Pneumococcal Polysaccharide-23 09/22/2000  . Td 07/08/2005  . Tdap 11/10/2013   Pertinent  Health Maintenance Due  Topic Date Due  . PNA vac  Low Risk Adult (2 of 2 - PCV13) 02/22/2017 (Originally 09/22/2001)  . DEXA SCAN  08/19/2017 (Originally [birth year]-Jan-201984)  . INFLUENZA VACCINE  09/22/2016   Fall Risk  01/13/2015 06/28/2013  Falls in the past year? No No  Risk for fall due to : Impaired balance/gait -   Functional Status Survey:    Vitals:   09/21/16 1159  BP: 126/68  Pulse: 68  Resp: (!) 22  Temp: 98.5 F (36.9 C)  SpO2: 94%  Weight: 114 lb 3.2 oz (51.8 kg)  Height: 5' (1.524 m)   Body mass index is 22.3 kg/m. Physical Exam  Constitutional: She appears well-developed and well-nourished.  HENT:  Head: Normocephalic and atraumatic.  Left Ear: External ear normal.  Eyes: Pupils are equal, round, and reactive to light. Conjunctivae and EOM are normal.  Right lower eye lid redness, chronic ectropion  Neck: Normal range of motion. Neck supple.  Cardiovascular: Intact distal pulses.   Murmur (2/6/SEM) heard. Heart rate 110s bpm, irregular. 09/01/16 echocardiogram: mild aortic insufficiency, moderate mitral insufficiency, moderate TR/mold PI with  moderate pulmonary hypertension, normal LV function.   Pulmonary/Chest: Effort normal  and breath sounds normal. No respiratory distress.  Abdominal: Soft. Bowel sounds are normal.  Musculoskeletal: Normal range of motion. She exhibits edema. She exhibits no tenderness.  Ankle edema, trace, left  Neurological: She is alert. She has normal reflexes. No cranial nerve deficit. She exhibits normal muscle tone. Coordination normal.  Significant dementia. Holding doll.  Skin: Skin is warm.  Lemon sized Lentigo to left lower cheek. The right elbow skin tear is healing nicely  Psychiatric: She has a normal mood and affect. Her affect is not blunt, not labile and not inappropriate. Her speech is not delayed, not tangential and not slurred. She is not agitated, not aggressive, not hyperactive, not slowed, not withdrawn, not actively hallucinating and not combative. Thought content is paranoid. Thought content is not delusional. Cognition and memory are impaired. She expresses inappropriate judgment. She does not express impulsivity. She expresses no homicidal and no suicidal ideation. She expresses no suicidal plans and no homicidal plans. She is communicative. She exhibits abnormal recent memory and abnormal remote memory. She is attentive.    Labs reviewed:  Recent Labs  08/31/16 09/07/16 09/14/16  NA 142 143 143  K 4.2 4.1 3.8  BUN 30* 36*  36* 39*  CREATININE 0.9 1.3* 1.4*    Recent Labs  06/10/16 06/29/16 08/31/16  AST 24 29 19   ALT 11 14 12   ALKPHOS 67 50 70    Recent Labs  06/10/16 06/29/16 08/31/16  WBC 5.3 4.8 4.6  HGB 12.3 11.8* 11.4*  HCT 35* 34* 34*  PLT 214 194 220   Lab Results  Component Value Date   TSH 0.92 09/14/2016   No results found for: HGBA1C No results found for: CHOL, HDL, LDLCALC, LDLDIRECT, TRIG, CHOLHDL  Significant Diagnostic Results in last 30 days:  No results found.  Assessment/Plan A-fib (HCC) Heart rate is in not in control. Continue Bystolic 10mg , dc Amlodipine 5mg  po qd, adding diltiazepm 30mg  po qid for better heart rate  control. Monitor AP/BP q shift.   Anticoagulation goal of INR 2 to 3 Hold Coumadin and ASA x 5 days prior to the right lower blepharoplasty surgery 09/27/16, will resume ASA and Coumadin 24-48 hours after surgery is no bleeding.   Congestive heart failure (CHF) (HCC) Compensated clinically, risk for developing CHF decompensation given uncontrolled heart rate, continue Furosemide, observe the patient.  Preoperative  clearance the POA is aware of the benefit vs risk of holding coumadin and uncontrol heart rate in the event of surgery, she desires to wait, reassess the patient in 1-2 days.      Family/ staff Communication: fall risk, VS q shift.   Labs/tests ordered:  none

## 2016-09-21 NOTE — Assessment & Plan Note (Addendum)
the POA is aware of the benefit vs risk of holding coumadin and uncontrol heart rate in the event of surgery, she desires to wait, reassess the patient in 1-2 days.

## 2016-09-21 NOTE — Assessment & Plan Note (Signed)
Compensated clinically, risk for developing CHF decompensation given uncontrolled heart rate, continue Furosemide, observe the patient.

## 2016-09-21 NOTE — Assessment & Plan Note (Signed)
Hold Coumadin and ASA x 5 days prior to the right lower blepharoplasty surgery 09/27/16, will resume ASA and Coumadin 24-48 hours after surgery is no bleeding.

## 2016-09-22 HISTORY — PX: OTHER SURGICAL HISTORY: SHX169

## 2016-09-27 DIAGNOSIS — H11431 Conjunctival hyperemia, right eye: Secondary | ICD-10-CM | POA: Diagnosis not present

## 2016-09-27 DIAGNOSIS — H02532 Eyelid retraction right lower eyelid: Secondary | ICD-10-CM | POA: Diagnosis not present

## 2016-09-27 DIAGNOSIS — H02132 Senile ectropion of right lower eyelid: Secondary | ICD-10-CM | POA: Diagnosis not present

## 2016-09-27 DIAGNOSIS — H02112 Cicatricial ectropion of right lower eyelid: Secondary | ICD-10-CM | POA: Diagnosis not present

## 2016-10-01 ENCOUNTER — Non-Acute Institutional Stay (SKILLED_NURSING_FACILITY): Payer: PPO | Admitting: Nurse Practitioner

## 2016-10-01 ENCOUNTER — Encounter: Payer: Self-pay | Admitting: Nurse Practitioner

## 2016-10-01 DIAGNOSIS — Z7901 Long term (current) use of anticoagulants: Secondary | ICD-10-CM

## 2016-10-01 DIAGNOSIS — Z5181 Encounter for therapeutic drug level monitoring: Secondary | ICD-10-CM

## 2016-10-01 DIAGNOSIS — I481 Persistent atrial fibrillation: Secondary | ICD-10-CM | POA: Diagnosis not present

## 2016-10-01 DIAGNOSIS — I4819 Other persistent atrial fibrillation: Secondary | ICD-10-CM

## 2016-10-01 NOTE — Progress Notes (Signed)
Location:  [facility] Room Number: 107 Place of Service:  SNF (31) Provider:  Sabrina Arriaga, Manxie  NP  Blanchie Serve, MD  Patient Care Team: Blanchie Serve, MD as PCP - General (Internal Medicine) Reeya Bound X, NP as Nurse Practitioner (Nurse Practitioner) Wilford Corner, MD as Consulting Physician (Gastroenterology) Sanda Klein, MD as Consulting Physician (Cardiology)  Extended Emergency Contact Information Primary Emergency Contact: [name],Pat Address: [place] [address] [name] of [place] Phone: [phone] Mobile Phone: [phone] Relation: Daughter  Code Status:  DNR Goals of care: Advanced Directive information Advanced Directives 09/21/2016  Does Patient Have a Medical Advance Directive? Yes  Type of Paramedic of [place];Out of facility DNR (pink MOST or yellow form)  Does patient want to make changes to medical advance directive? No - Patient declined  Copy of [place] in Chart? Yes  Pre-existing out of facility DNR order (yellow form or pink MOST form) Yellow form placed in chart (order not valid for inpatient use)     Chief Complaint  Patient presents with  . Acute Visit    PT/INR    HPI:  Pt is a 83 y.o. female seen today for an acute visit for sub therapeutic anticoagulation treatment for Afib, INR 1.1 10/01/16. AFib, heart rate is controlled, taking Bystolic 10mg  qd, Diltiazem 30mg  qid.    Past Medical History:  Diagnosis Date  . Anemia, unspecified   . Anorexia   . Anxiety   . Arthritis   . Atherosclerotic cerebrovascular disease   . Backache, unspecified   . Cataract   . Cholelithiasis   . Chronic kidney disease, unspecified   . Closed fracture of right inferior pubic ramus ([place]) 11/12/2013   11/10/13 s/p fall, ED eval  X-ray R hip  1. Possible nondisplaced right inferior pubic ramus fracture.  2. No femur fracture or dislocation   02/06/14 dc prn  Motrin and Norco-not used    . Depression   . Diaphragmatic hernia without mention of obstruction or gangrene   . Diseases of lips 03/26/2013  . Diverticulosis of colon (without mention of hemorrhage)   . Hemorrhage of rectum and anus 03/26/2013  . Hyperlipidemia   . Hypertension   . Insomnia, unspecified   . Laceration of occipital scalp 11/12/2013  . Macular degeneration 06/28/2013  . Macular degeneration (senile) of retina, unspecified   . Osteoporosis   . Other malaise and fatigue   . Other sleep disturbances   . Other specified cardiac dysrhythmias(427.89)   . Other specified personal history presenting hazards to health(V15.89)   . Other symptoms involving cardiovascular system   . Pancreatitis   . Personal history of other diseases of circulatory system   . Personal history of other diseases of digestive system    pancreatitis from gallstones  . Personal history of other diseases of respiratory system   . Personal history of traumatic fracture   . Rosacea   . Senile osteoporosis    with old T11 fracture  . Thyroid disease   . Unspecified disorders of arteries and arterioles   . Unspecified hearing loss   . Unspecified hemorrhoids without mention of complication    Past Surgical History:  Procedure Laterality Date  . ABDOMINAL HYSTERECTOMY     and BSO fibroids  . COLONOSCOPY  05/23/2007  . JOINT REPLACEMENT  1996  . TOTAL HIP ARTHROPLASTY Right     Allergies  Allergen  Reactions  . Ace Inhibitors     cough  . Mirtazapine     Nightmares   . Penicillins Rash    Outpatient Encounter Prescriptions as of 10/01/2016  Medication Sig  . acetaminophen (TYLENOL) 325 MG tablet Take 650 mg by mouth every 6 (six) hours as needed.  Marland Kitchen acetaminophen (TYLENOL) 500 MG tablet Take 500 mg by mouth 2 (two) times daily.  Marland Kitchen aspirin 81 MG tablet Take 81 mg by mouth daily.  Marland Kitchen BYSTOLIC 10 MG tablet Take one tablet daily for blood pressusre  . clindamycin (CLEOCIN) 150 MG capsule Take  4(1000) capsule by mouth 1 hr prior to dental appointment.  Marland Kitchen diltiazem (CARDIZEM) 30 MG tablet Take 30 mg by mouth 4 (four) times daily.  Marland Kitchen escitalopram (LEXAPRO) 5 MG tablet Take 5 mg by mouth daily.  . furosemide (LASIX) 20 MG tablet Take 10 mg by mouth daily.  Marland Kitchen levothyroxine (SYNTHROID, LEVOTHROID) 25 MCG tablet Take 25 mcg by mouth daily before breakfast.   . Lidocaine (ASPERCREME LIDOCAINE) 4 % PTCH Apply topically. Apply patch every night(8:00pm)  to lower back, and remove at (8:00 am).  Marland Kitchen loperamide (IMODIUM A-D) 2 MG tablet Take 2 mg by mouth as needed for diarrhea or loose stools.   . memantine (NAMENDA XR) 28 MG CP24 24 hr capsule Take 28 mg by mouth.  . Nutritional Supplements (BENECALORIE PO) Take 1 Container by mouth daily.  . polyethylene glycol (MIRALAX / GLYCOLAX) packet Take 17 g by mouth every 3 (three) days.   . polyvinyl alcohol (LIQUIFILM TEARS) 1.4 % ophthalmic solution Place 1 drop into both eyes 4 (four) times daily.   . potassium chloride (K-DUR,KLOR-CON) 10 MEQ tablet Take 10 mEq by mouth daily.  Marland Kitchen saccharomyces boulardii (FLORASTOR) 250 MG capsule Take 250 mg by mouth 2 (two) times daily.  Marland Kitchen warfarin (COUMADIN) 5 MG tablet Take 5 mg by mouth daily.  . [DISCONTINUED] amLODipine (NORVASC) 5 MG tablet Take 5 mg by mouth daily.  . [DISCONTINUED] warfarin (COUMADIN) 2 MG tablet Take 2 mg by mouth daily.   No facility-administered encounter medications on file as of 10/01/2016.    ROS provided by the patient with assistance of staff Review of Systems  Constitutional: Negative for activity change, appetite change and fatigue.  HENT: Negative for nosebleeds.   Eyes:       Right lower eye lid blepharoplasty  Cardiovascular: Negative for leg swelling.  Gastrointestinal: Negative for blood in stool.  Endocrine:       Hypothyroid  Genitourinary: Negative for hematuria.  Skin: Negative for pallor.    Immunization History  Administered Date(s) Administered  . DTaP  10/12/2012  . Influenza-Unspecified 11/22/2012, 12/26/2013, 11/12/2014, 12/09/2015  . PPD Test 12/06/2013  . Pneumococcal Polysaccharide-23 09/22/2000  . Td 07/08/2005  . Tdap 11/10/2013   Pertinent  Health Maintenance Due  Topic Date Due  . INFLUENZA VACCINE  09/22/2016  . PNA vac Low Risk Adult (2 of 2 - PCV13) 02/22/2017 (Originally 09/22/2001)  . DEXA SCAN  08/19/2017 (Originally November 30, 201984)   Fall Risk  01/13/2015 06/28/2013  Falls in the past year? No No  Risk for fall due to : Impaired balance/gait -   Functional Status Survey:    There were no vitals filed for this visit. There is no height or weight on file to calculate BMI. Physical Exam  Constitutional: She appears well-developed and well-nourished.  Eyes: Pupils are equal, round, and reactive to light. Conjunctivae and EOM are normal.  Right lower  eye lid redness, s/p blepharoplasty for chronic ectropion  Cardiovascular: Intact distal pulses.   Murmur (2/6/SEM) heard. Heart rate 80s bpm, irregular.  Pulmonary/Chest: Effort normal and breath sounds normal. No respiratory distress.  Neurological: She is alert.    Labs reviewed:  Recent Labs  08/31/16 09/07/16 09/14/16  NA 142 143 143  K 4.2 4.1 3.8  BUN 30* 36*  36* 39*  CREATININE 0.9 1.3* 1.4*    Recent Labs  06/10/16 06/29/16 08/31/16  AST 24 29 19   ALT 11 14 12   ALKPHOS 67 50 70    Recent Labs  06/10/16 06/29/16 08/31/16  WBC 5.3 4.8 4.6  HGB 12.3 11.8* 11.4*  HCT 35* 34* 34*  PLT 214 194 220   Lab Results  Component Value Date   TSH 0.92 09/14/2016   No results found for: HGBA1C No results found for: CHOL, HDL, LDLCALC, LDLDIRECT, TRIG, CHOLHDL  Significant Diagnostic Results in last 30 days:  No results found.  Assessment/Plan Anticoagulation goal of INR 2 to 3 10/01/16 INR 1.1, Coumadin 5mg  10/01/16, then 3mg  daily, PT/INR one week.  A-fib (HCC) Heart rate is in control. Continue Bystolic 10mg ,  diltiazepm 30mg  po qid.        Family/ staff Communication: plan of care was reviewed with the patient and charge nurse  Labs/tests ordered:  PT/INR one week

## 2016-10-01 NOTE — Assessment & Plan Note (Signed)
10/01/16 INR 1.1, Coumadin 5mg  10/01/16, then 3mg  daily, PT/INR one week.

## 2016-10-01 NOTE — Assessment & Plan Note (Signed)
Heart rate is in control. Continue Bystolic 10mg,  diltiazepm 30mg po qid.  

## 2016-10-07 ENCOUNTER — Encounter: Payer: Self-pay | Admitting: Internal Medicine

## 2016-10-07 ENCOUNTER — Non-Acute Institutional Stay (SKILLED_NURSING_FACILITY): Payer: PPO | Admitting: Internal Medicine

## 2016-10-07 DIAGNOSIS — I4819 Other persistent atrial fibrillation: Secondary | ICD-10-CM

## 2016-10-07 DIAGNOSIS — I6529 Occlusion and stenosis of unspecified carotid artery: Secondary | ICD-10-CM | POA: Diagnosis not present

## 2016-10-07 DIAGNOSIS — F418 Other specified anxiety disorders: Secondary | ICD-10-CM | POA: Diagnosis not present

## 2016-10-07 DIAGNOSIS — N183 Chronic kidney disease, stage 3 unspecified: Secondary | ICD-10-CM

## 2016-10-07 DIAGNOSIS — I10 Essential (primary) hypertension: Secondary | ICD-10-CM | POA: Diagnosis not present

## 2016-10-07 DIAGNOSIS — I481 Persistent atrial fibrillation: Secondary | ICD-10-CM | POA: Diagnosis not present

## 2016-10-07 NOTE — Progress Notes (Signed)
Opened in error

## 2016-10-07 NOTE — Progress Notes (Addendum)
Location:  [place] Room Number: [place]:  SNF [phone]) Provider:  Blanchie Serve MD  Blanchie Serve, MD  Patient Care Team: Blanchie Serve, MD as PCP - General (Internal Medicine) Mast, Man X, NP as Nurse Practitioner (Nurse Practitioner) Wilford Corner, MD as Consulting Physician (Gastroenterology) Sanda Klein, MD as Consulting Physician (Cardiology)  Extended Emergency Contact Information Primary Emergency Contact: [name],Pat Address: [place] [address] [name] of [place] Phone: [phone] Mobile Phone: [phone] Relation: Daughter  Code Status: DNR Goals of care: Advanced Directive information Advanced Directives 09/21/2016  Does Patient Have a Medical Advance Directive? Yes  Type of Paramedic of [place];Out of facility DNR (pink MOST or yellow form)  Does patient want to make changes to medical advance directive? No - Patient declined  Copy of [place] in Chart? Yes  Pre-existing out of facility DNR order (yellow form or pink MOST form) Yellow form placed in chart (order not valid for inpatient use)     Chief Complaint  Patient presents with  . Medical Management of Chronic Issues    Routine Visit    HPI:  Pt is a 83 y.o. female seen today for medical management of chronic diseases. She does not participate in HPI and ROS with her dementia. Information obtained from her nurse. She recently had blepharoplasty and this was complicated by purulent drainage from her eyelid. This had to be drained and she is now on tobramycin eye drop. She appears comfortable. No new concern from nursing otherwise. She needs assistance with most of her ADLs except feeding. She gets around with her walker and no fall reported. She is incontinent with her bowel and bladder. No pressure ulcer or wound has been reported.     Past Medical History:  Diagnosis Date  .  Anemia, unspecified   . Anorexia   . Anxiety   . Arthritis   . Atherosclerotic cerebrovascular disease   . Backache, unspecified   . Cataract   . Cholelithiasis   . Chronic kidney disease, unspecified   . Closed fracture of right inferior pubic ramus ([place]) 11/12/2013   11/10/13 s/p fall, ED eval  X-ray R hip  1. Possible nondisplaced right inferior pubic ramus fracture.  2. No femur fracture or dislocation   02/06/14 dc prn Motrin and Norco-not used    . Depression   . Diaphragmatic hernia without mention of obstruction or gangrene   . Diseases of lips 03/26/2013  . Diverticulosis of colon (without mention of hemorrhage)   . Hemorrhage of rectum and anus 03/26/2013  . Hyperlipidemia   . Hypertension   . Insomnia, unspecified   . Laceration of occipital scalp 11/12/2013  . Macular degeneration 06/28/2013  . Macular degeneration (senile) of retina, unspecified   . Osteoporosis   . Other malaise and fatigue   . Other sleep disturbances   . Other specified cardiac dysrhythmias(427.89)   . Other specified personal history presenting hazards to health(V15.89)   . Other symptoms involving cardiovascular system   . Pancreatitis   . Personal history of other diseases of circulatory system   . Personal history of other diseases of digestive system    pancreatitis from gallstones  . Personal history of other diseases of respiratory system   . Personal history of traumatic fracture   . Rosacea   . Senile osteoporosis    with old T11 fracture  .  Thyroid disease   . Unspecified disorders of arteries and arterioles   . Unspecified hearing loss   . Unspecified hemorrhoids without mention of complication    Past Surgical History:  Procedure Laterality Date  . ABDOMINAL HYSTERECTOMY     and BSO fibroids  . COLONOSCOPY  05/23/2007  . JOINT REPLACEMENT  1996  . TOTAL HIP ARTHROPLASTY Right     Allergies  Allergen Reactions  . Ace Inhibitors     cough  . Mirtazapine     Nightmares   .  Penicillins Rash    Outpatient Encounter Prescriptions as of 10/07/2016  Medication Sig  . acetaminophen (TYLENOL) 325 MG tablet Take 650 mg by mouth every 6 (six) hours as needed.  Marland Kitchen acetaminophen (TYLENOL) 500 MG tablet Take 500 mg by mouth 2 (two) times daily.  Marland Kitchen aspirin 81 MG tablet Take 81 mg by mouth daily.  Marland Kitchen BYSTOLIC 10 MG tablet Take one tablet daily for blood pressusre  . clindamycin (CLEOCIN) 150 MG capsule Take 4(1000) capsule by mouth 1 hr prior to dental appointment.  Marland Kitchen diltiazem (CARDIZEM) 30 MG tablet Take 30 mg by mouth 4 (four) times daily.  Marland Kitchen escitalopram (LEXAPRO) 5 MG tablet Take 5 mg by mouth daily.  . furosemide (LASIX) 20 MG tablet Take 10 mg by mouth daily.  Marland Kitchen levothyroxine (SYNTHROID, LEVOTHROID) 25 MCG tablet Take 25 mcg by mouth daily before breakfast.   . Lidocaine (ASPERCREME LIDOCAINE) 4 % PTCH Apply topically. Apply patch every night(8:00pm)  to lower back, and remove at (8:00 am).  Marland Kitchen loperamide (IMODIUM A-D) 2 MG tablet Take 2 mg by mouth as needed for diarrhea or loose stools.   . memantine (NAMENDA XR) 28 MG CP24 24 hr capsule Take 28 mg by mouth.  . Nutritional Supplements (BENECALORIE PO) Take 1 Container by mouth daily.  . polyethylene glycol (MIRALAX / GLYCOLAX) packet Take 17 g by mouth every 3 (three) days.   . polyvinyl alcohol (LIQUIFILM TEARS) 1.4 % ophthalmic solution Place 1 drop into both eyes 4 (four) times daily.   . potassium chloride (K-DUR,KLOR-CON) 10 MEQ tablet Take 10 mEq by mouth daily.  Marland Kitchen tobramycin-dexamethasone (TOBRADEX) ophthalmic ointment 1 application 3 (three) times daily. Right lower abscess. Stop date 10/11/16  . tobramycin-dexamethasone (TOBRADEX) ophthalmic solution Place 1 drop into the right eye 4 (four) times daily. Stop date 10/11/16  . warfarin (COUMADIN) 3 MG tablet Take 3 mg by mouth daily.  . [DISCONTINUED] saccharomyces boulardii (FLORASTOR) 250 MG capsule Take 250 mg by mouth 2 (two) times daily.   No  facility-administered encounter medications on file as of 10/07/2016.     Review of Systems  Unable to perform ROS: Dementia  Psychiatric/Behavioral: Positive for confusion.    Immunization History  Administered Date(s) Administered  . DTaP 10/12/2012  . Influenza-Unspecified 11/22/2012, 12/26/2013, 11/12/2014, 12/09/2015  . PPD Test 12/06/2013  . Pneumococcal Polysaccharide-23 09/22/2000  . Td 07/08/2005  . Tdap 11/10/2013   Pertinent  Health Maintenance Due  Topic Date Due  . INFLUENZA VACCINE  11/22/2016 (Originally 09/22/2016)  . PNA vac Low Risk Adult (2 of 2 - PCV13) 02/22/2017 (Originally 09/22/2001)  . DEXA SCAN  08/19/2017 (Originally September 14, 201984)   Fall Risk  01/13/2015 06/28/2013  Falls in the past year? No No  Risk for fall due to : Impaired balance/gait -   Functional Status Survey:    Vitals:   10/07/16 1413  BP: 130/80  Pulse: 86  Resp: 20  Temp: (!) 97.4 F (36.3  C)  TempSrc: Oral  SpO2: 97%  Weight: 114 lb 6.4 oz (51.9 kg)  Height: 5' (1.524 m)   Body mass index is 22.34 kg/m.   Wt Readings from Last 3 Encounters:  10/07/16 114 lb 6.4 oz (51.9 kg)  09/21/16 114 lb 3.2 oz (51.8 kg)  09/13/16 119 lb 9.6 oz (54.3 kg)    Physical Exam  Constitutional: She appears well-developed. No distress.  Thin built, frail, elderly female  HENT:  Head: Normocephalic and atraumatic.  Mouth/Throat: Oropharynx is clear and moist.  Right lower eyelid ectropion, mild erythema to right conjunctiva  Eyes: Pupils are equal, round, and reactive to light. Conjunctivae are normal. Left eye exhibits no discharge.  Right lower eyelid mild erythema, no drainage noted  Neck: Normal range of motion. Neck supple.  Cardiovascular:  Murmur heard. Irregular heart rate  Pulmonary/Chest: Effort normal and breath sounds normal. She has no wheezes. She has no rales.  Abdominal: Soft. Bowel sounds are normal. She exhibits no distension. There is no tenderness. There is no guarding.    Musculoskeletal: She exhibits edema.  Trace edema. Can move all 4 extremities, uses a walker  Lymphadenopathy:    She has no cervical adenopathy.  Neurological:  Pleasantly confused, oriented only to self  Skin: Skin is warm and dry. She is not diaphoretic.  Psychiatric: She has a normal mood and affect.  dementia    Labs reviewed:  Recent Labs  08/31/16 09/07/16 09/14/16  NA 142 143 143  K 4.2 4.1 3.8  BUN 30* 36*  36* 39*  CREATININE 0.9 1.3* 1.4*    Recent Labs  06/10/16 06/29/16 08/31/16  AST 24 29 19   ALT 11 14 12   ALKPHOS 67 50 70    Recent Labs  06/10/16 06/29/16 08/31/16  WBC 5.3 4.8 4.6  HGB 12.3 11.8* 11.4*  HCT 35* 34* 34*  PLT 214 194 220   Lab Results  Component Value Date   TSH 0.92 09/14/2016   No results found for: HGBA1C No results found for: CHOL, HDL, LDLCALC, LDLDIRECT, TRIG, CHOLHDL  Significant Diagnostic Results in last 30 days:  No results found.  Assessment/Plan  Chronic kidney disease stage 3 Impaired renal function. With her age and fraility, maintain hydration and monitor. Check renal function every 6 month.   HTN Overall several low readings of 100/52, 116/68, 102/56, 110/58, 108/58 . Currently on bystolic 10 mg daily and diltiazem 30 mg qid with lasix 10 mg daily. Given her age and co-morbidities and fraility, avoid hypotension and fall. Does not appear volume overloaded. Change lasix to 10 mg daily as needed only for leg edema. Change kcl to daily as needed only when lasix being provided. Monitor clinically and BP reading.   Depression with anxiety Stable mood, currently on escitalopram 5 mg daily. Attempt GDR to 5 mg every other day x 1 week and discontinue.   afib Controlled HR. Continue diltiazem 30 mg qid and warfarin for stroke prophylaxis.   Carotid atherosclerosis Continue aspirin for now.    Family/ staff Communication: reviewed care plan with patient and charge nurse.    Labs/tests ordered:  none   Blanchie Serve, MD Internal Medicine [facility] [address] [place], [place] [address] Cell Phone (Monday-Friday 8 am - 5 pm): [phone] On Call: [phone] and follow prompts after 5 pm and on weekends Office Phone: [phone] Office Fax: [phone]  Depression and anxiety Spoke with daughter Loreli Slot over the phone. She  is concerned that weaning her off escitalopram will make her depression worse. Explained that we will be monitoring her and her behavior during this period. She would like for it to be changed to every other day for at least 2 weeks and pt be reassessed. Agree with this. New orders placed.

## 2016-10-07 NOTE — Patient Instructions (Signed)
Opened in error

## 2016-10-12 DIAGNOSIS — I4891 Unspecified atrial fibrillation: Secondary | ICD-10-CM | POA: Diagnosis not present

## 2016-10-13 ENCOUNTER — Encounter: Payer: Self-pay | Admitting: Nurse Practitioner

## 2016-10-13 ENCOUNTER — Non-Acute Institutional Stay (SKILLED_NURSING_FACILITY): Payer: PPO | Admitting: Nurse Practitioner

## 2016-10-13 DIAGNOSIS — K640 First degree hemorrhoids: Secondary | ICD-10-CM

## 2016-10-13 DIAGNOSIS — Z5181 Encounter for therapeutic drug level monitoring: Secondary | ICD-10-CM

## 2016-10-13 DIAGNOSIS — Z7901 Long term (current) use of anticoagulants: Secondary | ICD-10-CM | POA: Diagnosis not present

## 2016-10-13 DIAGNOSIS — I480 Paroxysmal atrial fibrillation: Secondary | ICD-10-CM

## 2016-10-13 NOTE — Progress Notes (Signed)
Location:  [place] Room Number: 107 Place of Service:  SNF (31) Provider:  Hy Swiatek, Manxie  NP  Blanchie Serve, MD  Patient Care Team: Blanchie Serve, MD as PCP - General (Internal Medicine) [place] Newmann X, NP as Nurse Practitioner (Nurse Practitioner) Wilford Corner, MD as Consulting Physician (Gastroenterology) Sanda Klein, MD as Consulting Physician (Cardiology)  Extended Emergency Contact Information Primary Emergency Contact: [name],Pat Address: [place] [address] [name] of [place] Phone: [phone] Mobile Phone: [phone] Relation: Daughter  Code Status:  DNR Goals of care: Advanced Directive information Advanced Directives 10/13/2016  Does Patient Have a Medical Advance Directive? Yes  Type of Paramedic of [place];Out of facility DNR (pink MOST or yellow form)  Does patient want to make changes to medical advance directive? No - Patient declined  Copy of [place] in Chart? Yes  Pre-existing out of facility DNR order (yellow form or pink MOST form) Yellow form placed in chart (order not valid for inpatient use)     Chief Complaint  Patient presents with  . Acute Visit    Elevated PT/INR    HPI:  Pt is a 83 y.o. female seen today for an acute visit for supra therapeutic INR 3.3 10/13/16, it was 3.9 10/12/16, Coumadin has been held. Staff reported there was small amount of blood on tissue after BM, but no blood in stool or urine. She has Hx of hemorrhoids.   Afib, heart rate is in control, taking Diltiazem   Past Medical History:  Diagnosis Date  . Anemia, unspecified   . Anorexia   . Anxiety   . Arthritis   . Atherosclerotic cerebrovascular disease   . Backache, unspecified   . Cataract   . Cholelithiasis   . Chronic kidney disease, unspecified   . Closed fracture of right inferior pubic ramus ([place]) 11/12/2013   11/10/13 s/p fall, ED eval  X-ray R  hip  1. Possible nondisplaced right inferior pubic ramus fracture.  2. No femur fracture or dislocation   02/06/14 dc prn Motrin and Norco-not used    . Depression   . Diaphragmatic hernia without mention of obstruction or gangrene   . Diseases of lips 03/26/2013  . Diverticulosis of colon (without mention of hemorrhage)   . Hemorrhage of rectum and anus 03/26/2013  . Hyperlipidemia   . Hypertension   . Insomnia, unspecified   . Laceration of occipital scalp 11/12/2013  . Macular degeneration 06/28/2013  . Macular degeneration (senile) of retina, unspecified   . Osteoporosis   . Other malaise and fatigue   . Other sleep disturbances   . Other specified cardiac dysrhythmias(427.89)   . Other specified personal history presenting hazards to health(V15.89)   . Other symptoms involving cardiovascular system   . Pancreatitis   . Personal history of other diseases of circulatory system   . Personal history of other diseases of digestive system    pancreatitis from gallstones  . Personal history of other diseases of respiratory system   . Personal history of traumatic fracture   . Rosacea   . Senile osteoporosis    with old T11 fracture  . Thyroid disease   . Unspecified disorders of arteries and arterioles   . Unspecified hearing loss   . Unspecified hemorrhoids without mention of complication    Past Surgical History:  Procedure Laterality Date  . ABDOMINAL HYSTERECTOMY     and  BSO fibroids  . COLONOSCOPY  05/23/2007  . JOINT REPLACEMENT  1996  . TOTAL HIP ARTHROPLASTY Right     Allergies  Allergen Reactions  . Ace Inhibitors     cough  . Mirtazapine     Nightmares   . Penicillins Rash    Outpatient Encounter Prescriptions as of 10/13/2016  Medication Sig  . acetaminophen (TYLENOL) 325 MG tablet Take 650 mg by mouth every 6 (six) hours as needed.  Marland Kitchen acetaminophen (TYLENOL) 500 MG tablet Take 500 mg by mouth 2 (two) times daily.  Marland Kitchen aspirin 81 MG tablet Take 81 mg by mouth  daily.  Marland Kitchen BYSTOLIC 10 MG tablet Take one tablet daily for blood pressusre  . clindamycin (CLEOCIN) 150 MG capsule Take 4(1000) capsule by mouth 1 hr prior to dental appointment.  Marland Kitchen diltiazem (CARDIZEM) 30 MG tablet Take 30 mg by mouth 4 (four) times daily.  Marland Kitchen escitalopram (LEXAPRO) 5 MG tablet Take 5 mg by mouth daily.  . furosemide (LASIX) 20 MG tablet Take 10 mg by mouth daily.  Marland Kitchen levothyroxine (SYNTHROID, LEVOTHROID) 25 MCG tablet Take 25 mcg by mouth daily before breakfast.   . Lidocaine (ASPERCREME LIDOCAINE) 4 % PTCH Apply topically. Apply patch every night(8:00pm)  to lower back, and remove at (8:00 am).  Marland Kitchen loperamide (IMODIUM A-D) 2 MG tablet Take 2 mg by mouth as needed for diarrhea or loose stools.   . memantine (NAMENDA XR) 28 MG CP24 24 hr capsule Take 28 mg by mouth.  . Nutritional Supplements (BENECALORIE PO) Take 1 Container by mouth daily.  . polyethylene glycol (MIRALAX / GLYCOLAX) packet Take 17 g by mouth every 3 (three) days.   . polyvinyl alcohol (LIQUIFILM TEARS) 1.4 % ophthalmic solution Place 1 drop into both eyes 4 (four) times daily.   . potassium chloride (K-DUR,KLOR-CON) 10 MEQ tablet Take 10 mEq by mouth daily.  . [DISCONTINUED] tobramycin-dexamethasone (TOBRADEX) ophthalmic ointment 1 application 3 (three) times daily. Right lower abscess. Stop date 10/11/16  . [DISCONTINUED] tobramycin-dexamethasone (TOBRADEX) ophthalmic solution Place 1 drop into the right eye 4 (four) times daily. Stop date 10/11/16  . [DISCONTINUED] warfarin (COUMADIN) 3 MG tablet Take 3 mg by mouth daily.   No facility-administered encounter medications on file as of 10/13/2016.    ROS provided with assistance of staff Review of Systems  Unable to perform ROS: Dementia  Constitutional: Negative for activity change, appetite change, chills and fever.  Cardiovascular: Negative for palpitations and leg swelling.  Gastrointestinal: Positive for anal bleeding. Negative for abdominal distention,  abdominal pain, blood in stool, constipation, diarrhea, nausea, rectal pain and vomiting.  Psychiatric/Behavioral: Positive for confusion.    Immunization History  Administered Date(s) Administered  . DTaP 10/12/2012  . Influenza-Unspecified 11/22/2012, 12/26/2013, 11/12/2014, 12/09/2015  . PPD Test 12/06/2013  . Pneumococcal Polysaccharide-23 09/22/2000  . Td 07/08/2005  . Tdap 11/10/2013   Pertinent  Health Maintenance Due  Topic Date Due  . INFLUENZA VACCINE  11/22/2016 (Originally 09/22/2016)  . PNA vac Low Risk Adult (2 of 2 - PCV13) 02/22/2017 (Originally 09/22/2001)  . DEXA SCAN  08/19/2017 (Originally 01-22-1983)   Fall Risk  01/13/2015 06/28/2013  Falls in the past year? No No  Risk for fall due to : Impaired balance/gait -   Functional Status Survey:    Vitals:   10/13/16 1453  BP: 110/70  Pulse: 64  Resp: 18  Temp: (!) 97.2 F (36.2 C)  Weight: 115 lb 9.6 oz (52.4 kg)  Height: 5' (1.524 m)  Body mass index is 22.58 kg/m. Physical Exam  Constitutional: She appears well-developed and well-nourished.  HENT:  Head: Atraumatic.  Eyes: Pupils are equal, round, and reactive to light. Conjunctivae are normal.  Cardiovascular: Normal rate.   Murmur heard. Irregular heart rate  Abdominal: Soft. Bowel sounds are normal. She exhibits no distension. There is no tenderness. There is no guarding.  External hemorrhoids x2, no bleeding  Neurological:  Pleasantly confused, oriented only to self  Skin: Skin is warm and dry.  Psychiatric: She has a normal mood and affect.  dementia    Labs reviewed:  Recent Labs  08/31/16 09/07/16 09/14/16  NA 142 143 143  K 4.2 4.1 3.8  BUN 30* 36*  36* 39*  CREATININE 0.9 1.3* 1.4*    Recent Labs  06/10/16 06/29/16 08/31/16  AST 24 29 19   ALT 11 14 12   ALKPHOS 67 50 70    Recent Labs  06/10/16 06/29/16 08/31/16  WBC 5.3 4.8 4.6  HGB 12.3 11.8* 11.4*  HCT 35* 34* 34*  PLT 214 194 220   Lab Results  Component Value  Date   TSH 0.92 09/14/2016   No results found for: HGBA1C No results found for: CHOL, HDL, LDLCALC, LDLDIRECT, TRIG, CHOLHDL  Significant Diagnostic Results in last 30 days:  No results found.  Assessment/Plan Anticoagulation goal of INR 2 to 3 For Afib. Continue to hold Coumadin 10/13/16, then re-start Coumadin at lower dose of 2mg  po qd 10/14/16, repeat PT/INR 10/15/16  Hemorrhoids 2 small external hemorrhoids at 11am. Avoid constipation. 1% hydrocortisone cream to hemorrhoids bid.     A-fib (HCC) Heart rate is in control. Continue Bystolic 10mg ,  diltiazepm 30mg  po qid.      Family/ staff Communication: plan of care reviewed with the patient and charge nurse.   Labs/tests ordered:  PT/INR one week.   Time spend 25 minutes.

## 2016-10-13 NOTE — Assessment & Plan Note (Signed)
Heart rate is in control. Continue Bystolic 10mg ,  diltiazepm 30mg  po qid.

## 2016-10-13 NOTE — Assessment & Plan Note (Signed)
For Afib. Continue to hold Coumadin 10/13/16, then re-start Coumadin at lower dose of 2mg  po qd 10/14/16, repeat PT/INR 10/15/16

## 2016-10-13 NOTE — Assessment & Plan Note (Signed)
2 small external hemorrhoids at 11am. Avoid constipation. 1% hydrocortisone cream to hemorrhoids bid.

## 2016-10-14 DIAGNOSIS — I10 Essential (primary) hypertension: Secondary | ICD-10-CM | POA: Diagnosis not present

## 2016-10-14 DIAGNOSIS — E039 Hypothyroidism, unspecified: Secondary | ICD-10-CM | POA: Diagnosis not present

## 2016-10-14 DIAGNOSIS — F0390 Unspecified dementia without behavioral disturbance: Secondary | ICD-10-CM | POA: Diagnosis not present

## 2016-10-21 LAB — POCT INR: INR: 2.4 — AB (ref <=1.1)

## 2016-10-22 ENCOUNTER — Non-Acute Institutional Stay (SKILLED_NURSING_FACILITY): Payer: PPO

## 2016-10-22 ENCOUNTER — Other Ambulatory Visit: Payer: Self-pay | Admitting: *Deleted

## 2016-10-22 DIAGNOSIS — Z0000 Encounter for general adult medical examination without abnormal findings: Secondary | ICD-10-CM | POA: Diagnosis not present

## 2016-10-22 NOTE — Patient Instructions (Signed)
Patricia Nelson , Thank you for taking time to come for your Medicare Wellness Visit. I appreciate your ongoing commitment to your health goals. Please review the following plan we discussed and let me know if I can assist you in the future.   Screening recommendations/referrals: Colonoscopy excluded, pt over age 81 Mammogram excluded, pt over age 8 Bone Density up to date Recommended yearly ophthalmology/optometry visit for glaucoma screening and checkup Recommended yearly dental visit for hygiene and checkup  Vaccinations: Influenza vaccine due Pneumococcal vaccine 13 due, ordered Tdap vaccine up to date. Due 11/11/23 Shingles vaccine not in records  Advanced directives: DNR and [place] of attorney in chart. Living Will needed  Conditions/risks identified: None  Next appointment: Dr. Bubba Camp makes rounds   Preventive Care 65 Years and Older, Female Preventive care refers to lifestyle choices and visits with your health care provider that can promote health and wellness. What does preventive care include?  A yearly physical exam. This is also called an annual well check.  Dental exams once or twice a year.  Routine eye exams. Ask your health care provider how often you should have your eyes checked.  Personal lifestyle choices, including:  Daily care of your teeth and gums.  Regular physical activity.  Eating a healthy diet.  Avoiding tobacco and drug use.  Limiting alcohol use.  Practicing safe sex.  Taking low-dose aspirin every day.  Taking vitamin and mineral supplements as recommended by your health care provider. What happens during an annual well check? The services and screenings done by your health care provider during your annual well check will depend on your age, overall health, lifestyle risk factors, and family history of disease. Counseling  Your health care provider may ask you questions about your:  Alcohol use.  Tobacco use.  Drug  use.  Emotional well-being.  Home and relationship well-being.  Sexual activity.  Eating habits.  History of falls.  Memory and ability to understand (cognition).  Work and work Statistician.  Reproductive health. Screening  You may have the following tests or measurements:  Height, weight, and BMI.  Blood pressure.  Lipid and cholesterol levels. These may be checked every 5 years, or more frequently if you are over 47 years old.  Skin check.  Lung cancer screening. You may have this screening every year starting at age 41 if you have a 30-pack-year history of smoking and currently smoke or have quit within the past 15 years.  Fecal occult blood test (FOBT) of the stool. You may have this test every year starting at age 56.  Flexible sigmoidoscopy or colonoscopy. You may have a sigmoidoscopy every 5 years or a colonoscopy every 10 years starting at age 63.  Hepatitis C blood test.  Hepatitis B blood test.  Sexually transmitted disease (STD) testing.  Diabetes screening. This is done by checking your blood sugar (glucose) after you have not eaten for a while (fasting). You may have this done every 1-3 years.  Bone density scan. This is done to screen for osteoporosis. You may have this done starting at age 73.  Mammogram. This may be done every 1-2 years. Talk to your health care provider about how often you should have regular mammograms. Talk with your health care provider about your test results, treatment options, and if necessary, the need for more tests. Vaccines  Your health care provider may recommend certain vaccines, such as:  Influenza vaccine. This is recommended every year.  Tetanus, diphtheria, and acellular  pertussis (Tdap, Td) vaccine. You may need a Td booster every 10 years.  Zoster vaccine. You may need this after age 90.  Pneumococcal 13-valent conjugate (PCV13) vaccine. One dose is recommended after age 84.  Pneumococcal polysaccharide  (PPSV23) vaccine. One dose is recommended after age 70. Talk to your health care provider about which screenings and vaccines you need and how often you need them. This information is not intended to replace advice given to you by your health care provider. Make sure you discuss any questions you have with your health care provider. Document Released: 03/07/2015 Document Revised: 10/29/2015 Document Reviewed: 12/10/2014 Elsevier Interactive Patient Education  2017 [employer] Prevention in the Home Falls can cause injuries. They can happen to people of all ages. There are many things you can do to make your home safe and to help prevent falls. What can I do on the outside of my home?  Regularly fix the edges of walkways and driveways and fix any cracks.  Remove anything that might make you trip as you walk through a door, such as a raised step or threshold.  Trim any bushes or trees on the path to your home.  Use bright outdoor lighting.  Clear any walking paths of anything that might make someone trip, such as rocks or tools.  Regularly check to see if handrails are loose or broken. Make sure that both sides of any steps have handrails.  Any raised decks and porches should have guardrails on the edges.  Have any leaves, snow, or ice cleared regularly.  Use sand or salt on walking paths during winter.  Clean up any spills in your garage right away. This includes oil or grease spills. What can I do in the bathroom?  Use night lights.  Install grab bars by the toilet and in the tub and shower. Do not use towel bars as grab bars.  Use non-skid mats or decals in the tub or shower.  If you need to sit down in the shower, use a plastic, non-slip stool.  Keep the floor dry. Clean up any water that spills on the floor as soon as it happens.  Remove soap buildup in the tub or shower regularly.  Attach bath mats securely with double-sided non-slip rug tape.  Do not have  throw rugs and other things on the floor that can make you trip. What can I do in the bedroom?  Use night lights.  Make sure that you have a light by your bed that is easy to reach.  Do not use any sheets or blankets that are too big for your bed. They should not hang down onto the floor.  Have a firm chair that has side arms. You can use this for support while you get dressed.  Do not have throw rugs and other things on the floor that can make you trip. What can I do in the kitchen?  Clean up any spills right away.  Avoid walking on wet floors.  Keep items that you use a lot in easy-to-reach places.  If you need to reach something above you, use a strong step stool that has a grab bar.  Keep electrical cords out of the way.  Do not use floor polish or wax that makes floors slippery. If you must use wax, use non-skid floor wax.  Do not have throw rugs and other things on the floor that can make you trip. What can I do with my stairs?  Do not leave any items on the stairs.  Make sure that there are handrails on both sides of the stairs and use them. Fix handrails that are broken or loose. Make sure that handrails are as long as the stairways.  Check any carpeting to make sure that it is firmly attached to the stairs. Fix any carpet that is loose or worn.  Avoid having throw rugs at the top or bottom of the stairs. If you do have throw rugs, attach them to the floor with carpet tape.  Make sure that you have a light switch at the top of the stairs and the bottom of the stairs. If you do not have them, ask someone to add them for you. What else can I do to help prevent falls?  Wear shoes that:  Do not have high heels.  Have rubber bottoms.  Are comfortable and fit you well.  Are closed at the toe. Do not wear sandals.  If you use a stepladder:  Make sure that it is fully opened. Do not climb a closed stepladder.  Make sure that both sides of the stepladder are  locked into place.  Ask someone to hold it for you, if possible.  Clearly mark and make sure that you can see:  Any grab bars or handrails.  First and last steps.  Where the edge of each step is.  Use tools that help you move around (mobility aids) if they are needed. These include:  Canes.  Walkers.  Scooters.  Crutches.  Turn on the lights when you go into a dark area. Replace any light bulbs as soon as they burn out.  Set up your furniture so you have a clear path. Avoid moving your furniture around.  If any of your floors are uneven, fix them.  If there are any pets around you, be aware of where they are.  Review your medicines with your doctor. Some medicines can make you feel dizzy. This can increase your chance of falling. Ask your doctor what other things that you can do to help prevent falls. This information is not intended to replace advice given to you by your health care provider. Make sure you discuss any questions you have with your health care provider. Document Released: 12/05/2008 Document Revised: 07/17/2015 Document Reviewed: 03/15/2014 Elsevier Interactive Patient Education  2017 [employer].

## 2016-10-22 NOTE — Progress Notes (Signed)
Subjective:   Patricia Nelson is a 83 y.o. female who presents for Medicare Annual (Subsequent) preventive examination at [facility]; incapacitated patient unable to answer questions appropriately     Last AWV-10/12/12    Objective:     Vitals: BP 110/70 (BP Location: Right Arm, Patient Position: Sitting)   Pulse 96   Temp 98 F (36.7 C) (Oral)   Ht 5' (1.524 m)   Wt 116 lb (52.6 kg)   SpO2 98%   BMI 22.65 kg/m   Body mass index is 22.65 kg/m.   Tobacco History  Smoking Status  . Never Smoker  Smokeless Tobacco  . Never Used     Counseling given: Not Answered   Past Medical History:  Diagnosis Date  . Anemia, unspecified   . Anorexia   . Anxiety   . Arthritis   . Atherosclerotic cerebrovascular disease   . Backache, unspecified   . Cataract   . Cholelithiasis   . Chronic kidney disease, unspecified   . Closed fracture of right inferior pubic ramus ([place]) 11/12/2013   11/10/13 s/p fall, ED eval  X-ray R hip  1. Possible nondisplaced right inferior pubic ramus fracture.  2. No femur fracture or dislocation   02/06/14 dc prn Motrin and Norco-not used    . Depression   . Diaphragmatic hernia without mention of obstruction or gangrene   . Diseases of lips 03/26/2013  . Diverticulosis of colon (without mention of hemorrhage)   . Hemorrhage of rectum and anus 03/26/2013  . Hyperlipidemia   . Hypertension   . Insomnia, unspecified   . Laceration of occipital scalp 11/12/2013  . Macular degeneration 06/28/2013  . Macular degeneration (senile) of retina, unspecified   . Osteoporosis   . Other malaise and fatigue   . Other sleep disturbances   . Other specified cardiac dysrhythmias(427.89)   . Other specified personal history presenting hazards to health(V15.89)   . Other symptoms involving cardiovascular system   . Pancreatitis   . Personal history of other diseases of circulatory system   . Personal history of other diseases of digestive  system    pancreatitis from gallstones  . Personal history of other diseases of respiratory system   . Personal history of traumatic fracture   . Rosacea   . Senile osteoporosis    with old T11 fracture  . Thyroid disease   . Unspecified disorders of arteries and arterioles   . Unspecified hearing loss   . Unspecified hemorrhoids without mention of complication    Past Surgical History:  Procedure Laterality Date  . ABDOMINAL HYSTERECTOMY     and BSO fibroids  . COLONOSCOPY  05/23/2007  . JOINT REPLACEMENT  1996  . TOTAL HIP ARTHROPLASTY Right    Family History  Problem Relation Age of Onset  . Heart disease Mother   . Diabetes Mother   . Heart disease Father   . Diabetes Sister   . Diabetes Brother   . Diabetes Daughter    History  Sexual Activity  . Sexual activity: No    Outpatient Encounter Prescriptions as of 10/22/2016  Medication Sig  . acetaminophen (TYLENOL) 325 MG tablet Take 650 mg by mouth every 6 (six) hours as needed.  Marland Kitchen acetaminophen (TYLENOL) 500 MG tablet Take 500 mg by mouth 2 (two) times daily.  Marland Kitchen aspirin 81 MG tablet Take 81 mg by mouth daily.  Marland Kitchen BYSTOLIC 10 MG tablet Take one tablet daily for blood pressusre  .  clindamycin (CLEOCIN) 150 MG capsule Take 4(1000) capsule by mouth 1 hr prior to dental appointment.  Marland Kitchen diltiazem (CARDIZEM) 30 MG tablet Take 30 mg by mouth 4 (four) times daily.  Marland Kitchen escitalopram (LEXAPRO) 5 MG tablet Take 5 mg by mouth daily.  . furosemide (LASIX) 20 MG tablet Take 10 mg by mouth daily.  . hydrocortisone cream 1 % Apply 1 application topically 2 (two) times daily. To hemorriods  . levothyroxine (SYNTHROID, LEVOTHROID) 25 MCG tablet Take 25 mcg by mouth daily before breakfast.   . Lidocaine (ASPERCREME LIDOCAINE) 4 % PTCH Apply topically. Apply patch every night(8:00pm)  to lower back, and remove at (8:00 am).  Marland Kitchen loperamide (IMODIUM A-D) 2 MG tablet Take 2 mg by mouth as needed for diarrhea or loose stools.   . memantine  (NAMENDA XR) 28 MG CP24 24 hr capsule Take 28 mg by mouth.  . Nutritional Supplements (BENECALORIE PO) Take 1 Container by mouth daily.  . polyethylene glycol (MIRALAX / GLYCOLAX) packet Take 17 g by mouth every 3 (three) days.   . polyvinyl alcohol (LIQUIFILM TEARS) 1.4 % ophthalmic solution Place 1 drop into both eyes 4 (four) times daily.   . potassium chloride (K-DUR,KLOR-CON) 10 MEQ tablet Take 10 mEq by mouth daily.  Marland Kitchen warfarin (COUMADIN) 2 MG tablet Take 2 mg by mouth daily.   No facility-administered encounter medications on file as of 10/22/2016.     Activities of Daily Living In your present state of health, do you have any difficulty performing the following activities: 10/22/2016  Hearing? Y  Vision? Y  Difficulty concentrating or making decisions? Y  Walking or climbing stairs? Y  Dressing or bathing? Y  Doing errands, shopping? Y  Preparing Food and eating ? Y  Using the Toilet? Y  In the past six months, have you accidently leaked urine? Y  Do you have problems with loss of bowel control? Y  Managing your Medications? Y  Managing your Finances? Y  Housekeeping or managing your Housekeeping? Y  Some recent data might be hidden    Patient Care Team: Blanchie Serve, MD as PCP - General (Internal Medicine) Mast, Man X, NP as Nurse Practitioner (Nurse Practitioner) Wilford Corner, MD as Consulting Physician (Gastroenterology) Sanda Klein, MD as Consulting Physician (Cardiology)    Assessment:     Exercise Activities and Dietary recommendations Current Exercise Habits: The patient does not participate in regular exercise at present, Exercise limited by: neurologic condition(s)  Goals    None     Fall Risk Fall Risk  10/22/2016 01/13/2015 06/28/2013  Falls in the past year? No No No  Risk for fall due to : - Impaired balance/gait -   Depression Screen PHQ 2/9 Scores 10/22/2016 01/13/2015 06/28/2013  PHQ - 2 Score 0 0 3     Cognitive Function MMSE - Mini  Mental State Exam 10/22/2016  Not completed: Unable to complete        Immunization History  Administered Date(s) Administered  . DTaP 10/12/2012  . Influenza-Unspecified 11/22/2012, 12/26/2013, 11/12/2014, 12/09/2015  . PPD Test 12/06/2013  . Pneumococcal Polysaccharide-23 09/22/2000  . Td 07/08/2005  . Tdap 11/10/2013   Screening Tests Health Maintenance  Topic Date Due  . INFLUENZA VACCINE  11/22/2016 (Originally 09/22/2016)  . PNA vac Low Risk Adult (2 of 2 - PCV13) 02/22/2017 (Originally 09/22/2001)  . DEXA SCAN  08/19/2017 (Originally 25-Dec-201984)  . TETANUS/TDAP  11/11/2023      Plan:     I have personally reviewed  and addressed the Medicare Annual Wellness questionnaire and have noted the following in the patient's chart:  A. Medical and social history B. Use of alcohol, tobacco or illicit drugs  C. Current medications and supplements D. Functional ability and status E.  Nutritional status F.  Physical activity G. Advance directives H. List of other physicians I.  Hospitalizations, surgeries, and ER visits in previous 12 months J.  [place] to include hearing, vision, cognitive, depression L. Referrals and appointments - none  In addition, I am unable to review and discuss with incapacitated patient certain preventive protocols, quality metrics, and best practice recommendations. A written personalized care plan for preventive services as well as general preventive health recommendations were provided to patient.  See attached scanned questionnaire for additional information.   Signed,   Rich Reining, RN Nurse Health Advisor   Quick Notes   Health Maintenance: PNA 13 due     Abnormal Screen: Unable to complete mental exam     Patient Concerns: None     Nurse Concerns: None

## 2016-10-27 ENCOUNTER — Emergency Department (HOSPITAL_COMMUNITY): Payer: PPO

## 2016-10-27 ENCOUNTER — Encounter: Payer: Self-pay | Admitting: Nurse Practitioner

## 2016-10-27 ENCOUNTER — Encounter (HOSPITAL_COMMUNITY): Payer: Self-pay | Admitting: *Deleted

## 2016-10-27 ENCOUNTER — Non-Acute Institutional Stay (SKILLED_NURSING_FACILITY): Payer: PPO | Admitting: Nurse Practitioner

## 2016-10-27 ENCOUNTER — Inpatient Hospital Stay (HOSPITAL_COMMUNITY)
Admission: EM | Admit: 2016-10-27 | Discharge: 2016-11-03 | DRG: 470 | Disposition: A | Discharge status: Skilled Nursing Facility | Payer: PPO | Attending: Internal Medicine | Admitting: Internal Medicine

## 2016-10-27 DIAGNOSIS — I482 Chronic atrial fibrillation: Secondary | ICD-10-CM | POA: Diagnosis not present

## 2016-10-27 DIAGNOSIS — I130 Hypertensive heart and chronic kidney disease with heart failure and stage 1 through stage 4 chronic kidney disease, or unspecified chronic kidney disease: Secondary | ICD-10-CM | POA: Diagnosis present

## 2016-10-27 DIAGNOSIS — R9431 Abnormal electrocardiogram [ECG] [EKG]: Secondary | ICD-10-CM | POA: Diagnosis not present

## 2016-10-27 DIAGNOSIS — I672 Cerebral atherosclerosis: Secondary | ICD-10-CM | POA: Diagnosis not present

## 2016-10-27 DIAGNOSIS — Z7901 Long term (current) use of anticoagulants: Secondary | ICD-10-CM

## 2016-10-27 DIAGNOSIS — I272 Pulmonary hypertension, unspecified: Secondary | ICD-10-CM | POA: Diagnosis not present

## 2016-10-27 DIAGNOSIS — S72042A Displaced fracture of base of neck of left femur, initial encounter for closed fracture: Secondary | ICD-10-CM | POA: Diagnosis not present

## 2016-10-27 DIAGNOSIS — I480 Paroxysmal atrial fibrillation: Secondary | ICD-10-CM

## 2016-10-27 DIAGNOSIS — K649 Unspecified hemorrhoids: Secondary | ICD-10-CM | POA: Diagnosis present

## 2016-10-27 DIAGNOSIS — Y9301 Activity, walking, marching and hiking: Secondary | ICD-10-CM | POA: Diagnosis present

## 2016-10-27 DIAGNOSIS — D696 Thrombocytopenia, unspecified: Secondary | ICD-10-CM | POA: Diagnosis not present

## 2016-10-27 DIAGNOSIS — S72009A Fracture of unspecified part of neck of unspecified femur, initial encounter for closed fracture: Secondary | ICD-10-CM | POA: Diagnosis present

## 2016-10-27 DIAGNOSIS — Z880 Allergy status to penicillin: Secondary | ICD-10-CM

## 2016-10-27 DIAGNOSIS — S7292XA Unspecified fracture of left femur, initial encounter for closed fracture: Secondary | ICD-10-CM | POA: Diagnosis present

## 2016-10-27 DIAGNOSIS — Z9181 History of falling: Secondary | ICD-10-CM | POA: Diagnosis not present

## 2016-10-27 DIAGNOSIS — Z01810 Encounter for preprocedural cardiovascular examination: Secondary | ICD-10-CM | POA: Diagnosis not present

## 2016-10-27 DIAGNOSIS — S72002A Fracture of unspecified part of neck of left femur, initial encounter for closed fracture: Secondary | ICD-10-CM | POA: Diagnosis not present

## 2016-10-27 DIAGNOSIS — I4891 Unspecified atrial fibrillation: Secondary | ICD-10-CM | POA: Diagnosis present

## 2016-10-27 DIAGNOSIS — D631 Anemia in chronic kidney disease: Secondary | ICD-10-CM | POA: Diagnosis not present

## 2016-10-27 DIAGNOSIS — F419 Anxiety disorder, unspecified: Secondary | ICD-10-CM | POA: Diagnosis present

## 2016-10-27 DIAGNOSIS — Z5181 Encounter for therapeutic drug level monitoring: Secondary | ICD-10-CM

## 2016-10-27 DIAGNOSIS — F0390 Unspecified dementia without behavioral disturbance: Secondary | ICD-10-CM | POA: Diagnosis present

## 2016-10-27 DIAGNOSIS — R0902 Hypoxemia: Secondary | ICD-10-CM | POA: Diagnosis not present

## 2016-10-27 DIAGNOSIS — I509 Heart failure, unspecified: Secondary | ICD-10-CM | POA: Diagnosis not present

## 2016-10-27 DIAGNOSIS — M810 Age-related osteoporosis without current pathological fracture: Secondary | ICD-10-CM | POA: Diagnosis not present

## 2016-10-27 DIAGNOSIS — M25552 Pain in left hip: Secondary | ICD-10-CM | POA: Diagnosis not present

## 2016-10-27 DIAGNOSIS — M79652 Pain in left thigh: Secondary | ICD-10-CM

## 2016-10-27 DIAGNOSIS — W1839XA Other fall on same level, initial encounter: Secondary | ICD-10-CM | POA: Diagnosis present

## 2016-10-27 DIAGNOSIS — S199XXA Unspecified injury of neck, initial encounter: Secondary | ICD-10-CM | POA: Diagnosis not present

## 2016-10-27 DIAGNOSIS — Z471 Aftercare following joint replacement surgery: Secondary | ICD-10-CM | POA: Diagnosis not present

## 2016-10-27 DIAGNOSIS — R748 Abnormal levels of other serum enzymes: Secondary | ICD-10-CM | POA: Diagnosis not present

## 2016-10-27 DIAGNOSIS — Z66 Do not resuscitate: Secondary | ICD-10-CM | POA: Diagnosis present

## 2016-10-27 DIAGNOSIS — S8991XA Unspecified injury of right lower leg, initial encounter: Secondary | ICD-10-CM | POA: Diagnosis not present

## 2016-10-27 DIAGNOSIS — N179 Acute kidney failure, unspecified: Secondary | ICD-10-CM | POA: Diagnosis present

## 2016-10-27 DIAGNOSIS — W19XXXD Unspecified fall, subsequent encounter: Secondary | ICD-10-CM | POA: Diagnosis not present

## 2016-10-27 DIAGNOSIS — N189 Chronic kidney disease, unspecified: Secondary | ICD-10-CM

## 2016-10-27 DIAGNOSIS — H3530 Unspecified macular degeneration: Secondary | ICD-10-CM | POA: Diagnosis present

## 2016-10-27 DIAGNOSIS — I5032 Chronic diastolic (congestive) heart failure: Secondary | ICD-10-CM | POA: Diagnosis present

## 2016-10-27 DIAGNOSIS — E7800 Pure hypercholesterolemia, unspecified: Secondary | ICD-10-CM | POA: Diagnosis not present

## 2016-10-27 DIAGNOSIS — W19XXXA Unspecified fall, initial encounter: Secondary | ICD-10-CM | POA: Diagnosis present

## 2016-10-27 DIAGNOSIS — E785 Hyperlipidemia, unspecified: Secondary | ICD-10-CM | POA: Diagnosis present

## 2016-10-27 DIAGNOSIS — S0990XA Unspecified injury of head, initial encounter: Secondary | ICD-10-CM | POA: Diagnosis not present

## 2016-10-27 DIAGNOSIS — H9190 Unspecified hearing loss, unspecified ear: Secondary | ICD-10-CM | POA: Diagnosis present

## 2016-10-27 DIAGNOSIS — Z96641 Presence of right artificial hip joint: Secondary | ICD-10-CM | POA: Diagnosis present

## 2016-10-27 DIAGNOSIS — S299XXA Unspecified injury of thorax, initial encounter: Secondary | ICD-10-CM | POA: Diagnosis not present

## 2016-10-27 DIAGNOSIS — Z888 Allergy status to other drugs, medicaments and biological substances status: Secondary | ICD-10-CM

## 2016-10-27 DIAGNOSIS — J90 Pleural effusion, not elsewhere classified: Secondary | ICD-10-CM | POA: Diagnosis not present

## 2016-10-27 DIAGNOSIS — I10 Essential (primary) hypertension: Secondary | ICD-10-CM | POA: Diagnosis present

## 2016-10-27 DIAGNOSIS — N183 Chronic kidney disease, stage 3 (moderate): Secondary | ICD-10-CM

## 2016-10-27 DIAGNOSIS — E039 Hypothyroidism, unspecified: Secondary | ICD-10-CM | POA: Diagnosis not present

## 2016-10-27 DIAGNOSIS — K449 Diaphragmatic hernia without obstruction or gangrene: Secondary | ICD-10-CM | POA: Diagnosis not present

## 2016-10-27 DIAGNOSIS — R001 Bradycardia, unspecified: Secondary | ICD-10-CM | POA: Diagnosis not present

## 2016-10-27 DIAGNOSIS — F329 Major depressive disorder, single episode, unspecified: Secondary | ICD-10-CM | POA: Diagnosis not present

## 2016-10-27 DIAGNOSIS — T148XXA Other injury of unspecified body region, initial encounter: Secondary | ICD-10-CM | POA: Diagnosis not present

## 2016-10-27 DIAGNOSIS — S79911A Unspecified injury of right hip, initial encounter: Secondary | ICD-10-CM | POA: Diagnosis not present

## 2016-10-27 DIAGNOSIS — Z7982 Long term (current) use of aspirin: Secondary | ICD-10-CM

## 2016-10-27 DIAGNOSIS — I340 Nonrheumatic mitral (valve) insufficiency: Secondary | ICD-10-CM | POA: Diagnosis not present

## 2016-10-27 DIAGNOSIS — Z96642 Presence of left artificial hip joint: Secondary | ICD-10-CM | POA: Diagnosis not present

## 2016-10-27 LAB — CBC WITH DIFFERENTIAL/PLATELET
Basophils Absolute: 0 10*3/uL (ref 0.0–0.1)
Basophils Relative: 0 %
EOS ABS: 0 10*3/uL (ref 0.0–0.7)
Eosinophils Relative: 0 %
HCT: 39.1 % (ref 36.0–46.0)
Hemoglobin: 12.4 g/dL (ref 12.0–15.0)
LYMPHS ABS: 0.8 10*3/uL (ref 0.7–4.0)
LYMPHS PCT: 8 %
MCH: 30 pg (ref 26.0–34.0)
MCHC: 31.7 g/dL (ref 30.0–36.0)
MCV: 94.7 fL (ref 78.0–100.0)
MONO ABS: 0.4 10*3/uL (ref 0.1–1.0)
MONOS PCT: 4 %
Neutro Abs: 8.4 10*3/uL — ABNORMAL HIGH (ref 1.7–7.7)
Neutrophils Relative %: 88 %
PLATELETS: 174 10*3/uL (ref 150–400)
RBC: 4.13 MIL/uL (ref 3.87–5.11)
RDW: 14.1 % (ref 11.5–15.5)
WBC: 9.6 10*3/uL (ref 4.0–10.5)

## 2016-10-27 LAB — BASIC METABOLIC PANEL
Anion gap: 9 (ref 5–15)
BUN: 27 mg/dL — AB (ref 6–20)
CO2: 22 mmol/L (ref 22–32)
CREATININE: 0.91 mg/dL (ref 0.44–1.00)
Calcium: 9.3 mg/dL (ref 8.9–10.3)
Chloride: 110 mmol/L (ref 101–111)
GFR calc Af Amer: 58 mL/min — ABNORMAL LOW (ref >60)
GFR, EST NON AFRICAN AMERICAN: 50 mL/min — AB (ref >60)
GLUCOSE: 138 mg/dL — AB (ref 65–99)
POTASSIUM: 3.7 mmol/L (ref 3.5–5.1)
SODIUM: 141 mmol/L (ref 135–145)

## 2016-10-27 LAB — PROTIME-INR
INR: 3.06
Prothrombin Time: 31.4 seconds — ABNORMAL HIGH (ref 11.4–15.2)

## 2016-10-27 MED ORDER — MORPHINE SULFATE (PF) 4 MG/ML IV SOLN
1.0000 mg | INTRAVENOUS | Status: DC | PRN
  Administered 2016-10-28: 2 mg via INTRAVENOUS
  Filled 2016-10-27: qty 1

## 2016-10-27 MED ORDER — METHOCARBAMOL 1000 MG/10ML IJ SOLN
500.0000 mg | Freq: Four times a day (QID) | INTRAVENOUS | Status: DC | PRN
  Filled 2016-10-27: qty 5

## 2016-10-27 MED ORDER — NEBIVOLOL HCL 5 MG PO TABS
10.0000 mg | ORAL_TABLET | Freq: Every day | ORAL | Status: DC
  Administered 2016-10-29 – 2016-11-03 (×6): 10 mg via ORAL
  Filled 2016-10-27: qty 1
  Filled 2016-10-27 (×2): qty 2
  Filled 2016-10-27: qty 1
  Filled 2016-10-27 (×3): qty 2
  Filled 2016-10-27 (×2): qty 1
  Filled 2016-10-27: qty 2

## 2016-10-27 MED ORDER — POLYETHYLENE GLYCOL 3350 17 G PO PACK
17.0000 g | PACK | ORAL | Status: DC
  Filled 2016-10-27: qty 1

## 2016-10-27 MED ORDER — HYDROCODONE-ACETAMINOPHEN 5-325 MG PO TABS: 1.0000 | ORAL_TABLET | Freq: Four times a day (QID) | ORAL | Status: DC | PRN

## 2016-10-27 MED ORDER — PHYTONADIONE 5 MG PO TABS
5.0000 mg | ORAL_TABLET | Freq: Once | ORAL | Status: DC
  Filled 2016-10-27: qty 1

## 2016-10-27 MED ORDER — ESCITALOPRAM OXALATE 10 MG PO TABS
5.0000 mg | ORAL_TABLET | Freq: Every day | ORAL | Status: DC
  Administered 2016-10-29 – 2016-11-03 (×5): 5 mg via ORAL
  Filled 2016-10-27 (×5): qty 1

## 2016-10-27 MED ORDER — MEMANTINE HCL ER 28 MG PO CP24
28.0000 mg | ORAL_CAPSULE | Freq: Every day | ORAL | Status: DC
  Administered 2016-10-29 – 2016-11-03 (×5): 28 mg via ORAL
  Filled 2016-10-27 (×7): qty 1

## 2016-10-27 MED ORDER — MORPHINE SULFATE (PF) 4 MG/ML IV SOLN
2.0000 mg | Freq: Once | INTRAVENOUS | Status: AC
  Administered 2016-10-27: 2 mg via INTRAVENOUS
  Filled 2016-10-27: qty 1

## 2016-10-27 MED ORDER — DILTIAZEM HCL 30 MG PO TABS
30.0000 mg | ORAL_TABLET | Freq: Three times a day (TID) | ORAL | Status: DC
  Administered 2016-10-28 – 2016-11-03 (×19): 30 mg via ORAL
  Filled 2016-10-27 (×22): qty 1

## 2016-10-27 MED ORDER — LEVOTHYROXINE SODIUM 25 MCG PO TABS
25.0000 ug | ORAL_TABLET | Freq: Every day | ORAL | Status: DC
  Administered 2016-10-29 – 2016-11-03 (×6): 25 ug via ORAL
  Filled 2016-10-27 (×7): qty 1

## 2016-10-27 MED ORDER — SODIUM CHLORIDE 0.9 % IV SOLN
INTRAVENOUS | Status: AC
  Administered 2016-10-27: 23:00:00 via INTRAVENOUS

## 2016-10-27 MED ORDER — POLYETHYLENE GLYCOL 3350 17 G PO PACK
17.0000 g | PACK | Freq: Every day | ORAL | Status: DC | PRN
  Filled 2016-10-27 (×2): qty 1

## 2016-10-27 MED ORDER — METHOCARBAMOL 500 MG PO TABS: 500.0000 mg | ORAL_TABLET | Freq: Four times a day (QID) | ORAL | Status: DC | PRN

## 2016-10-27 MED ORDER — ONDANSETRON HCL 4 MG/2ML IJ SOLN
4.0000 mg | Freq: Once | INTRAMUSCULAR | Status: AC
  Administered 2016-10-27: 4 mg via INTRAVENOUS
  Filled 2016-10-27: qty 2

## 2016-10-27 NOTE — Consult Note (Addendum)
ORTHOPAEDIC CONSULTATION  REQUESTING PHYSICIAN: Toy Baker, MD  PCP:  Blanchie Serve, MD  Chief Complaint: Left hip fracture  HPI: Patricia Nelson is a 83 y.o. female who complains of Left hip pain following a ground-level fall earlier today.  She has a medical history significant for dementia as well as atrial fibrillation on Coumadin. She does also have fairly advanced macular degeneration.  She has been worked up in the emergency department for any occult head bleeds which were negative and has been found on the workup to have a left femoral neck fracture.  This is a closed injury.  She does live a skilled nursing facility at baseline in the memory care unit due to progressive dementia.  She is independent with getting dressed but needs assistance with most other activities of daily living.  She walks with a walker at baseline.  About 10 years ago she had a right hip fracture required a monopolar replacement performed by my partner Dr. Alvan Dame.  Current HPI is obtained through chart review, discussion with the emergency apartment physician, and her daughter and son.  Past Medical History:  Diagnosis Date  . Anemia, unspecified   . Anorexia   . Anxiety   . Arthritis   . Atherosclerotic cerebrovascular disease   . Backache, unspecified   . Cataract   . Cholelithiasis   . Chronic kidney disease, unspecified   . Closed fracture of right inferior pubic ramus ([place]) 11/12/2013   11/10/13 s/p fall, ED eval  X-ray R hip  1. Possible nondisplaced right inferior pubic ramus fracture.  2. No femur fracture or dislocation   02/06/14 dc prn Motrin and Norco-not used    . Depression   . Diaphragmatic hernia without mention of obstruction or gangrene   . Diseases of lips 03/26/2013  . Diverticulosis of colon (without mention of hemorrhage)   . Hemorrhage of rectum and anus 03/26/2013  . Hyperlipidemia   . Hypertension   . Insomnia, unspecified   . Laceration of occipital scalp 11/12/2013    . Macular degeneration 06/28/2013  . Macular degeneration (senile) of retina, unspecified   . Osteoporosis   . Other malaise and fatigue   . Other sleep disturbances   . Other specified cardiac dysrhythmias(427.89)   . Other specified personal history presenting hazards to health(V15.89)   . Other symptoms involving cardiovascular system   . Pancreatitis   . Personal history of other diseases of circulatory system   . Personal history of other diseases of digestive system    pancreatitis from gallstones  . Personal history of other diseases of respiratory system   . Personal history of traumatic fracture   . Rosacea   . Senile osteoporosis    with old T11 fracture  . Thyroid disease   . Unspecified disorders of arteries and arterioles   . Unspecified hearing loss   . Unspecified hemorrhoids without mention of complication    Past Surgical History:  Procedure Laterality Date  . ABDOMINAL HYSTERECTOMY     and BSO fibroids  . COLONOSCOPY  05/23/2007  . JOINT REPLACEMENT  1996  . TOTAL HIP ARTHROPLASTY Right    Social History   Social History  . Marital status: Widowed    Spouse name: N/A  . Number of children: N/A  . Years of education: N/A   Occupational History  . retired Freight forwarder    Social History Main Topics  . Smoking status: Never Smoker  . Smokeless tobacco: Never Used  . Alcohol  use No  . Drug use: No  . Sexual activity: No   Other Topics Concern  . None   Social History Narrative   Lives at [facility] [place] 2011, transferred to memory care 11/12/2013   Widowed   Caffeine   Exercise: none   Never smoked   Alcohol none   DNR   Family History  Problem Relation Age of Onset  . Heart disease Mother   . Diabetes Mother   . Heart disease Father   . Diabetes Sister   . Diabetes Brother   . Diabetes Daughter    Allergies  Allergen Reactions  . Ace Inhibitors Cough  . Mirtazapine Other (See Comments)    Nightmares   .  Penicillins Rash    Has patient had a PCN reaction causing immediate rash, facial/tongue/throat swelling, SOB or lightheadedness with hypotension: Yes Has patient had a PCN reaction causing severe rash involving mucus membranes or skin necrosis: Unknown Has patient had a PCN reaction that required hospitalization: Unknown Has patient had a PCN reaction occurring within the last 10 years: Unknown If all of the above answers are "NO", then may proceed with Cephalosporin use.    Prior to Admission medications   Medication Sig Start Date End Date Taking? Authorizing Provider  acetaminophen (TYLENOL) 325 MG tablet Take 650 mg by mouth every 6 (six) hours as needed for mild pain.    Yes [provider]  acetaminophen (TYLENOL) 500 MG tablet Take 500 mg by mouth 2 (two) times daily.   Yes [provider]  aspirin 81 MG tablet Take 81 mg by mouth daily.   Yes [provider]  BYSTOLIC 10 MG tablet Take 10 mg by mouth daily.  09/24/13  Yes [provider]  clindamycin (CLEOCIN) 150 MG capsule Take 600 mg by mouth See admin instructions. ONE HOUR PRIOR TO DENTAL APPOINTMENTS   Yes [provider]  diltiazem (CARDIZEM) 30 MG tablet Take 30 mg by mouth 4 (four) times daily.   Yes [provider]  escitalopram (LEXAPRO) 5 MG tablet Take 5 mg by mouth daily.   Yes [provider]  furosemide (LASIX) 20 MG tablet Take 10 mg by mouth daily as needed for edema.   Yes [provider]  hydrocortisone cream 1 % Apply 1 application topically 2 (two) times daily. TO HEMORRHOIDS   Yes [provider]  levothyroxine (SYNTHROID, LEVOTHROID) 25 MCG tablet Take 25 mcg by mouth daily before breakfast.    Yes [provider]  Lidocaine (ASPERCREME LIDOCAINE) 4 % PTCH Apply 1 patch topically at bedtime. APPLY TO LOWER BACK AND REMOVE IN THE MORNING   Yes [provider]  loperamide (IMODIUM A-D) 2 MG tablet Take 2 mg by mouth See  admin instructions. If 3 loose stools in 24 hours and hold all laxatives and stool softeners with a max dose of 16 mg/24 hours   Yes [provider]  memantine (NAMENDA XR) 28 MG CP24 24 hr capsule Take 28 mg by mouth.   Yes [provider]  Nutritional Supplements (BENECALORIE PO) Take 1.5 oz by mouth 2 (two) times daily.    Yes [provider]  polyethylene glycol (MIRALAX / GLYCOLAX) packet Take 17 g by mouth every other day. For constipation and hold for loose stools   Yes [provider]  polyvinyl alcohol (LIQUIFILM TEARS) 1.4 % ophthalmic solution Place 1 drop into both eyes 4 (four) times daily.    Yes [provider]  potassium chloride (K-DUR,KLOR-CON) 10 MEQ tablet Take 10 mEq by mouth daily as needed (for edema/if taking edema).    Yes [provider]  warfarin (COUMADIN) 2 MG tablet Take 2 mg by mouth daily.   Yes [provider]   Dg Chest 1 View  Result Date: 10/27/2016 CLINICAL DATA:  Fall from standing.  LEFT hip pain. EXAM: CHEST 1 VIEW COMPARISON:  Chest radiograph February 20, 2009 FINDINGS: Cardiac silhouette is moderately enlarged. Calcified aortic knob. Similar chronic interstitial changes. Hyperinflation. Calcified granulomas. No pleural effusion or focal consolidation. Apical pleural thickening is similar. No pneumothorax. Osteopenia. Old T8 and T11 compression fractures. IMPRESSION: Stable cardiomegaly and COPD. Aortic Atherosclerosis (ICD10-I70.0). Electronically Signed   By: Elon Alas M.D.   On: 10/27/2016 18:14   Ct Head Wo Contrast  Result Date: 10/27/2016 CLINICAL DATA:  Fall from standing position with left hip fracture EXAM: CT HEAD WITHOUT CONTRAST CT CERVICAL SPINE WITHOUT CONTRAST TECHNIQUE: Multidetector CT imaging of the head and cervical spine was performed following the standard protocol without intravenous contrast. Multiplanar CT image reconstructions of the cervical spine were also generated.  COMPARISON:  11/10/2013 FINDINGS: CT HEAD FINDINGS Brain: No acute territorial infarction, hemorrhage, or intracranial mass is seen. Marked atrophy. Mild small vessel ischemic changes of the white matter. Stable small left sub insula hypodensities. Stable enlarged ventricles, felt secondary to atrophy. Vascular: No hyperdense vessels. Carotid artery calcifications. Vertebral artery calcification. Skull: No fracture Sinuses/Orbits: Mild mucosal thickening within the ethmoid sinuses. No acute orbital abnormality. Other: None CT CERVICAL SPINE FINDINGS Alignment: Trace retrolisthesis of C5 on C6, unchanged. Trace anterolisthesis C3 and C4 unchanged. Facet alignment within normal limits. Skull base and vertebrae: No acute fracture. No primary bone lesion or focal pathologic process. Soft tissues and spinal canal: No prevertebral fluid or swelling. No visible canal hematoma. Disc levels: Moderate severe degenerative changes at C4-C5, C5-C6 and C6-C7. Bilateral foraminal stenosis C4 through C7, most notable at C5 C6. Upper chest: Apical scarring.  Carotid artery calcification. Other: None IMPRESSION: 1. No CT evidence for acute intracranial abnormality. Atrophy and small vessel ischemic changes of the white matter 2. Stable cervical spine alignment with degenerative changes. No acute osseous abnormality Electronically Signed   By: Donavan Foil M.D.   On: 10/27/2016 18:54   Ct Cervical Spine Wo Contrast  Result Date: 10/27/2016 CLINICAL DATA:  Fall from standing position with left hip fracture EXAM: CT HEAD WITHOUT CONTRAST CT CERVICAL SPINE WITHOUT CONTRAST TECHNIQUE: Multidetector CT imaging of the head and cervical spine was performed following the standard protocol without intravenous contrast. Multiplanar CT image reconstructions of the cervical spine were also generated. COMPARISON:  11/10/2013 FINDINGS: CT HEAD FINDINGS Brain: No acute territorial infarction, hemorrhage, or intracranial mass is seen. Marked  atrophy. Mild small vessel ischemic changes of the white matter. Stable small left sub insula hypodensities. Stable enlarged ventricles, felt secondary to atrophy. Vascular: No hyperdense vessels. Carotid artery calcifications. Vertebral artery calcification. Skull: No fracture Sinuses/Orbits: Mild mucosal thickening within the ethmoid sinuses. No acute orbital abnormality. Other: None CT CERVICAL SPINE FINDINGS Alignment: Trace retrolisthesis of C5 on C6, unchanged. Trace anterolisthesis C3 and C4 unchanged. Facet alignment within normal limits. Skull base and vertebrae: No acute fracture. No primary bone lesion or focal pathologic process. Soft tissues and spinal canal: No prevertebral fluid or swelling. No visible canal hematoma. Disc levels: Moderate severe degenerative changes at C4-C5, C5-C6 and C6-C7. Bilateral foraminal stenosis C4 through C7, most notable at C5  C6. Upper chest: Apical scarring.  Carotid artery calcification. Other: None IMPRESSION: 1. No CT evidence for acute intracranial abnormality. Atrophy and small vessel ischemic changes of the white matter 2. Stable cervical spine alignment with degenerative changes. No acute osseous abnormality Electronically Signed   By: Donavan Foil M.D.   On: 10/27/2016 18:54   Dg Hip Unilat With Pelvis 2-3 Views Left  Result Date: 10/27/2016 CLINICAL DATA:  Fall EXAM: DG HIP (WITH OR WITHOUT PELVIS) 2-3V LEFT COMPARISON:  None. FINDINGS: Fracture left femoral neck with mild displacement. Normal left hip joint Chronic fracture right pubic rami. Right hip hemiarthroplasty. Lumbar disc degeneration IMPRESSION: Displaced fracture left femoral neck Electronically Signed   By: Franchot Gallo M.D.   On: 10/27/2016 18:13    Positive ROS: All other systems have been reviewed and were otherwise negative with the exception of those mentioned in the HPI and as above.  Physical Exam: General: no acute distress Cardiovascular: No pedal edema Respiratory: No  cyanosis, no use of accessory musculature GI: No organomegaly, abdomen is soft and non-tender Skin: No lesions in the area of chief complaint Neurologic: Sensation intact distally Psychiatric: Patient is NOT competent for consent  Lymphatic: No axillary or cervical lymphadenopathy  MUSCULOSKELETAL:  Physical examination of left hip: The left leg is held shortened and external rotated.Tender to palpation along the lateral hip.  There are no open wounds.  No ecchymosis.  The thigh is soft and compressible.  Distally she spontaneously moves the foot and ankle.  She does respond to light touch in the lower foot and ankle.  To her neurologic state however a focal neurologic exam is not able to be performed.  She has a 2+ dorsalis pedis pulse.  Assessment: Left hip femoral neck fracture closed  Plan: - I discussed with her daughter and son-in-law at the bedside she will need operative fixation of this left hip fracture.  Unfortunately at this time her INR is above 3 and this will need to be closer to 1.2 at the time of surgery. - She will be admitted to the hospitalist service for perioperative medical management and to follow up on her INR washout.  We recommend letting this drift down without reversal. - I have discussed this case with her initial orthopedic surgeon Dr. Alvan Dame, and he is aware this injury and able to assume care when she is appropriate for surgical management. - We will follow along with her INR and likely plan for surgery on Sunday but will certainly update the primary team if that is able to be done sooner. -Continued bedrest at this time with nonweightbearing to left lower extremity. - SCDs for DVT prophylaxis.     Nicholes Stairs, MD Cell [phone]    10/27/2016 9:31 PM

## 2016-10-27 NOTE — ED Notes (Signed)
purewick placed

## 2016-10-27 NOTE — Assessment & Plan Note (Signed)
Sustained pain in her left hip and upper thigh area from falling today, unable to bearing weight or internal or external rotation. X-ray anterior posterior and lateral viewed to r/u fractures. Non weight bearing left leg for now.

## 2016-10-27 NOTE — Assessment & Plan Note (Addendum)
Staff reported she was found on floor, she denied headache, dizziness, chest pain, palpitation, or focal weakness. She is taking Coumadin for Afib, will update CBC, PT/INR

## 2016-10-27 NOTE — ED Notes (Signed)
Pt not able to swallow, pt instructed to put straw into mouth and suck water into mouth but cannot perform task; pt says "ok I'll do it" but doesn't get water out; will inform MD

## 2016-10-27 NOTE — ED Notes (Signed)
Patient in CT at this time.

## 2016-10-27 NOTE — ED Provider Notes (Signed)
Pat[facility] DEPT Provider Note   CSN: [id number] Arrival date & time: 10/27/16  1650     History   Chief Complaint Chief Complaint  Patient presents with  . Fall    HPI  Patricia Nelson is a 83 y.o. female With past medical history significant for A. Fib (anticoagulated with Coumadin), dementia, meaning from SNF for fall this afternoon. [facility] film facility shows left hip fracture. There may have been head trauma, she's mentating at her baseline as per mother. Patient is denying any other complaints. She has seen Dr. Ihor Gully in the past for right hip replacements roughly in the last decade. Level V caveat secondary to dementia. Patient normally ambulates independently and she is a DO NOT RESUSCITATE.   Past Medical History:  Diagnosis Date  . Anemia, unspecified   . Anorexia   . Anxiety   . Arthritis   . Atherosclerotic cerebrovascular disease   . Backache, unspecified   . Cataract   . Cholelithiasis   . Chronic kidney disease, unspecified   . Closed fracture of right inferior pubic ramus ([place]) 11/12/2013   11/10/13 s/p fall, ED eval  X-ray R hip  1. Possible nondisplaced right inferior pubic ramus fracture.  2. No femur fracture or dislocation   02/06/14 dc prn Motrin and Norco-not used    . Depression   . Diaphragmatic hernia without mention of obstruction or gangrene   . Diseases of lips 03/26/2013  . Diverticulosis of colon (without mention of hemorrhage)   . Hemorrhage of rectum and anus 03/26/2013  . Hyperlipidemia   . Hypertension   . Insomnia, unspecified   . Laceration of occipital scalp 11/12/2013  . Macular degeneration 06/28/2013  . Macular degeneration (senile) of retina, unspecified   . Osteoporosis   . Other malaise and fatigue   . Other sleep disturbances   . Other specified cardiac dysrhythmias(427.89)   . Other specified personal history presenting hazards to health(V15.89)   . Other symptoms involving cardiovascular system   . Pancreatitis   . Personal  history of other diseases of circulatory system   . Personal history of other diseases of digestive system    pancreatitis from gallstones  . Personal history of other diseases of respiratory system   . Personal history of traumatic fracture   . Rosacea   . Senile osteoporosis    with old T11 fracture  . Thyroid disease   . Unspecified disorders of arteries and arterioles   . Unspecified hearing loss   . Unspecified hemorrhoids without mention of complication     Patient Active Problem List   Diagnosis Date Noted  . Acute pain of left thigh 10/27/2016  . Bradycardia 10/27/2016  . Closed left femoral fracture ([place]) 10/27/2016  . Hip fracture ([place]) 10/27/2016  . Preoperative clearance 09/21/2016  . Anticoagulation goal of INR 2 to 3 09/06/2016  . Tachycardia 09/03/2016  . Laceration of skin of right elbow 09/03/2016  . Congestive heart failure (CHF) ([place]) 09/03/2016  . Fall 09/03/2016  . Pneumonia 09/02/2016  . Edema 08/30/2016  . Stye external 08/23/2016  . Advanced atrophic nonexudative age-related macular degeneration of both eyes with subfoveal involvement 08/20/2016  . Ectropion of right lower eyelid 06/28/2016  . Vomiting 06/09/2016  . UTI (urinary tract infection) 02/02/2016  . Osteoarthritis 11/24/2015  . Pruritus 11/06/2014  . Anemia of chronic disease 06/28/2013  . CKD (chronic kidney disease) stage 3, GFR 30-59 ml/min 06/28/2013  . Hypothyroidism 06/28/2013  . Constipation 06/28/2013  . Depression  with anxiety 06/28/2013  . Senile dementia 06/28/2013  . Osteoporosis, unspecified 06/28/2013  . Insomnia 06/28/2013  . Urinary frequency 06/28/2013  . Hiatal hernia 06/28/2013  . A-fib ([place]) 06/28/2013  . Hx of syncope 06/28/2013  . Hemorrhoids 06/28/2013  . HOH (hard of hearing) 06/28/2013  . Diverticulosis 06/28/2013  . Hx of pancreatitis 06/28/2013  . HTN (hypertension) 10/18/2012  . Hypercholesteremia 10/18/2012  . Carotid atherosclerosis 10/18/2012  . H/O  acute pancreatitis 10/18/2012    Past Surgical History:  Procedure Laterality Date  . ABDOMINAL HYSTERECTOMY     and BSO fibroids  . COLONOSCOPY  05/23/2007  . JOINT REPLACEMENT  1996  . TOTAL HIP ARTHROPLASTY Right     OB History    No data available       Home Medications    Prior to Admission medications   Medication Sig Start Date End Date Taking? Authorizing Provider  acetaminophen (TYLENOL) 325 MG tablet Take 650 mg by mouth every 6 (six) hours as needed for mild pain.    Yes [provider]  acetaminophen (TYLENOL) 500 MG tablet Take 500 mg by mouth 2 (two) times daily.   Yes [provider]  aspirin 81 MG tablet Take 81 mg by mouth daily.   Yes [provider]  BYSTOLIC 10 MG tablet Take 10 mg by mouth daily.  09/24/13  Yes [provider]  clindamycin (CLEOCIN) 150 MG capsule Take 600 mg by mouth See admin instructions. ONE HOUR PRIOR TO DENTAL APPOINTMENTS   Yes [provider]  diltiazem (CARDIZEM) 30 MG tablet Take 30 mg by mouth 4 (four) times daily.   Yes [provider]  escitalopram (LEXAPRO) 5 MG tablet Take 5 mg by mouth daily.   Yes [provider]  furosemide (LASIX) 20 MG tablet Take 10 mg by mouth daily as needed for edema.   Yes [provider]  hydrocortisone cream 1 % Apply 1 application topically 2 (two) times daily. TO HEMORRHOIDS   Yes [provider]  levothyroxine (SYNTHROID, LEVOTHROID) 25 MCG tablet Take 25 mcg by mouth daily before breakfast.    Yes [provider]  Lidocaine (ASPERCREME LIDOCAINE) 4 % PTCH Apply 1 patch topically at bedtime. APPLY TO LOWER BACK AND REMOVE IN THE MORNING   Yes [provider]  loperamide (IMODIUM A-D) 2 MG tablet Take 2 mg by mouth See admin instructions. If 3 loose stools in 24 hours and hold all laxatives and stool softeners with a max dose of 16 mg/24 hours   Yes [provider]  memantine (NAMENDA XR) 28 MG  CP24 24 hr capsule Take 28 mg by mouth.   Yes [provider]  Nutritional Supplements (BENECALORIE PO) Take 1.5 oz by mouth 2 (two) times daily.    Yes [provider]  polyethylene glycol (MIRALAX / GLYCOLAX) packet Take 17 g by mouth every other day. For constipation and hold for loose stools   Yes [provider]  polyvinyl alcohol (LIQUIFILM TEARS) 1.4 % ophthalmic solution Place 1 drop into both eyes 4 (four) times daily.    Yes [provider]  potassium chloride (K-DUR,KLOR-CON) 10 MEQ tablet Take 10 mEq by mouth daily as needed (for edema/if taking edema).    Yes [provider]  warfarin (COUMADIN) 2 MG tablet Take 2 mg by mouth daily.   Yes [provider]    Family History Family History  Problem Relation Age of Onset  . Heart disease Mother   .  Diabetes Mother   . Heart disease Father   . Diabetes Sister   . Diabetes Brother   . Diabetes Daughter     Social History Social History  Substance Use Topics  . Smoking status: Never Smoker  . Smokeless tobacco: Never Used  . Alcohol use No     Allergies   Ace inhibitors; Mirtazapine; and Penicillins   Review of Systems Review of Systems  Unable to perform ROS: Dementia    A complete review of systems was obtained and all systems are negative except as noted in the HPI and PMH.    Physical Exam Updated Vital Signs BP 138/79   Pulse 96   Temp 98.7 F (37.1 C) (Oral)   Resp (!) 23   SpO2 90%   Physical Exam  Constitutional: She is oriented to person, place, and time. She appears well-developed and well-nourished. No distress.  HENT:  Head: Normocephalic and atraumatic.  Mouth/Throat: Oropharynx is clear and moist.  Eyes: Pupils are equal, round, and reactive to light. Conjunctivae and EOM are normal.  Neck: Normal range of motion.  Cardiovascular: Normal rate, regular rhythm and intact distal pulses.   Pulmonary/Chest: Effort normal and breath sounds  normal.  Abdominal: Soft. There is no tenderness.  Musculoskeletal:  Left leg is shortened, no significant rotation. Distally neurovascularly intact. No focal tenderness along the ankle or knee.  Neurological: She is alert and oriented to person, place, and time.  Skin: She is not diaphoretic.  Psychiatric: She has a normal mood and affect.  Nursing note and vitals reviewed.     ED Treatments / Results  Labs (all labs ordered are listed, but only abnormal results are displayed) Labs Reviewed  CBC WITH DIFFERENTIAL/PLATELET - Abnormal; Notable for the following:       Result Value   Neutro Abs 8.4 (*)    All other components within normal limits  BASIC METABOLIC PANEL - Abnormal; Notable for the following:    Glucose, Bld 138 (*)    BUN 27 (*)    GFR calc non Af Amer 50 (*)    GFR calc Af Amer 58 (*)    All other components within normal limits  PROTIME-INR - Abnormal; Notable for the following:    Prothrombin Time 31.4 (*)    All other components within normal limits  URINALYSIS, ROUTINE W REFLEX MICROSCOPIC  TSH  PROTIME-INR  TROPONIN I  TROPONIN I  TROPONIN I  CBC  BASIC METABOLIC PANEL  ALBUMIN  VITAMIN D 25 HYDROXY (VIT D DEFICIENCY, FRACTURES)  TYPE AND SCREEN    EKG  EKG Interpretation  Date/Time:  Wednesday October 27 2016 17:11:58 EDT Ventricular Rate:  92 PR Interval:    QRS Duration: 102 QT Interval:  343 QTC Calculation: 425 R Axis:   -40 Text Interpretation:  Age not entered, assumed to be  82 years old for purpose of ECG interpretation Atrial flutter Left axis deviation Anteroseptal infarct, old Borderline repolarization abnormality Confirmed by Fredia Sorrow [phone]) on 10/27/2016 5:23:57 PM       Radiology Dg Chest 1 View  Result Date: 10/27/2016 CLINICAL DATA:  Fall from standing.  LEFT hip pain. EXAM: CHEST 1 VIEW COMPARISON:  Chest radiograph February 20, 2009 FINDINGS: Cardiac silhouette is moderately enlarged. Calcified aortic knob.  Similar chronic interstitial changes. Hyperinflation. Calcified granulomas. No pleural effusion or focal consolidation. Apical pleural thickening is similar. No pneumothorax. Osteopenia. Old T8 and T11 compression fractures. IMPRESSION: Stable cardiomegaly and COPD. Aortic Atherosclerosis (ICD10-I70.0). Electronically  Signed   By: Elon Alas M.D.   On: 10/27/2016 18:14   Ct Head Wo Contrast  Result Date: 10/27/2016 CLINICAL DATA:  Fall from standing position with left hip fracture EXAM: CT HEAD WITHOUT CONTRAST CT CERVICAL SPINE WITHOUT CONTRAST TECHNIQUE: Multidetector CT imaging of the head and cervical spine was performed following the standard protocol without intravenous contrast. Multiplanar CT image reconstructions of the cervical spine were also generated. COMPARISON:  11/10/2013 FINDINGS: CT HEAD FINDINGS Brain: No acute territorial infarction, hemorrhage, or intracranial mass is seen. Marked atrophy. Mild small vessel ischemic changes of the white matter. Stable small left sub insula hypodensities. Stable enlarged ventricles, felt secondary to atrophy. Vascular: No hyperdense vessels. Carotid artery calcifications. Vertebral artery calcification. Skull: No fracture Sinuses/Orbits: Mild mucosal thickening within the ethmoid sinuses. No acute orbital abnormality. Other: None CT CERVICAL SPINE FINDINGS Alignment: Trace retrolisthesis of C5 on C6, unchanged. Trace anterolisthesis C3 and C4 unchanged. Facet alignment within normal limits. Skull base and vertebrae: No acute fracture. No primary bone lesion or focal pathologic process. Soft tissues and spinal canal: No prevertebral fluid or swelling. No visible canal hematoma. Disc levels: Moderate severe degenerative changes at C4-C5, C5-C6 and C6-C7. Bilateral foraminal stenosis C4 through C7, most notable at C5 C6. Upper chest: Apical scarring.  Carotid artery calcification. Other: None IMPRESSION: 1. No CT evidence for acute intracranial  abnormality. Atrophy and small vessel ischemic changes of the white matter 2. Stable cervical spine alignment with degenerative changes. No acute osseous abnormality Electronically Signed   By: Donavan Foil M.D.   On: 10/27/2016 18:54   Ct Cervical Spine Wo Contrast  Result Date: 10/27/2016 CLINICAL DATA:  Fall from standing position with left hip fracture EXAM: CT HEAD WITHOUT CONTRAST CT CERVICAL SPINE WITHOUT CONTRAST TECHNIQUE: Multidetector CT imaging of the head and cervical spine was performed following the standard protocol without intravenous contrast. Multiplanar CT image reconstructions of the cervical spine were also generated. COMPARISON:  11/10/2013 FINDINGS: CT HEAD FINDINGS Brain: No acute territorial infarction, hemorrhage, or intracranial mass is seen. Marked atrophy. Mild small vessel ischemic changes of the white matter. Stable small left sub insula hypodensities. Stable enlarged ventricles, felt secondary to atrophy. Vascular: No hyperdense vessels. Carotid artery calcifications. Vertebral artery calcification. Skull: No fracture Sinuses/Orbits: Mild mucosal thickening within the ethmoid sinuses. No acute orbital abnormality. Other: None CT CERVICAL SPINE FINDINGS Alignment: Trace retrolisthesis of C5 on C6, unchanged. Trace anterolisthesis C3 and C4 unchanged. Facet alignment within normal limits. Skull base and vertebrae: No acute fracture. No primary bone lesion or focal pathologic process. Soft tissues and spinal canal: No prevertebral fluid or swelling. No visible canal hematoma. Disc levels: Moderate severe degenerative changes at C4-C5, C5-C6 and C6-C7. Bilateral foraminal stenosis C4 through C7, most notable at C5 C6. Upper chest: Apical scarring.  Carotid artery calcification. Other: None IMPRESSION: 1. No CT evidence for acute intracranial abnormality. Atrophy and small vessel ischemic changes of the white matter 2. Stable cervical spine alignment with degenerative changes. No  acute osseous abnormality Electronically Signed   By: Donavan Foil M.D.   On: 10/27/2016 18:54   Dg Hip Unilat With Pelvis 2-3 Views Left  Result Date: 10/27/2016 CLINICAL DATA:  Fall EXAM: DG HIP (WITH OR WITHOUT PELVIS) 2-3V LEFT COMPARISON:  None. FINDINGS: Fracture left femoral neck with mild displacement. Normal left hip joint Chronic fracture right pubic rami. Right hip hemiarthroplasty. Lumbar disc degeneration IMPRESSION: Displaced fracture left femoral neck Electronically Signed   By: Juanda Crumble  Carlis Abbott M.D.   On: 10/27/2016 18:13    Procedures Procedures (including critical care time)  Medications Ordered in ED Medications  escitalopram (LEXAPRO) tablet 5 mg (not administered)  diltiazem (CARDIZEM) tablet 30 mg (30 mg Oral Not Given 10/27/16 2311)  memantine (NAMENDA XR) 24 hr capsule 28 mg (not administered)  nebivolol (BYSTOLIC) tablet 10 mg (not administered)  polyethylene glycol (MIRALAX / GLYCOLAX) packet 17 g (not administered)  levothyroxine (SYNTHROID, LEVOTHROID) tablet 25 mcg (not administered)  HYDROcodone-acetaminophen (NORCO/VICODIN) 5-325 MG per tablet 1-2 tablet (not administered)  methocarbamol (ROBAXIN) tablet 500 mg (not administered)    Or  methocarbamol (ROBAXIN) 500 mg in dextrose 5 % 50 mL IVPB (not administered)  0.9 %  sodium chloride infusion ( Intravenous New Bag/Given 10/27/16 2325)  polyethylene glycol (MIRALAX / GLYCOLAX) packet 17 g (not administered)  morphine 4 MG/ML injection 1-2 mg (not administered)  ondansetron (ZOFRAN) injection 4 mg (4 mg Intravenous Given 10/27/16 1842)  morphine 4 MG/ML injection 2 mg (2 mg Intravenous Given 10/27/16 1842)  phytonadione (VITAMIN K) tablet 5 mg (5 mg Oral Given 10/27/16 2215)     Initial Impression / Assessment and Plan / ED Course  I have reviewed the triage vital signs and the nursing notes.  Pertinent labs & imaging results that were available during my care of the patient were reviewed by me and considered in  my medical decision making (see chart for details).    Vitals:   10/27/16 2130 10/27/16 2200 10/27/16 2218 10/27/16 2230  BP: (!) 152/77 (!) 145/89  138/79  Pulse: (!) 43 86  96  Resp: (!) 21 (!) 21  (!) 23  Temp:   98.7 F (37.1 C)   TempSrc:   Oral   SpO2: 94% 90%      Medications  escitalopram (LEXAPRO) tablet 5 mg (not administered)  diltiazem (CARDIZEM) tablet 30 mg (30 mg Oral Not Given 10/27/16 2311)  memantine (NAMENDA XR) 24 hr capsule 28 mg (not administered)  nebivolol (BYSTOLIC) tablet 10 mg (not administered)  polyethylene glycol (MIRALAX / GLYCOLAX) packet 17 g (not administered)  levothyroxine (SYNTHROID, LEVOTHROID) tablet 25 mcg (not administered)  HYDROcodone-acetaminophen (NORCO/VICODIN) 5-325 MG per tablet 1-2 tablet (not administered)  methocarbamol (ROBAXIN) tablet 500 mg (not administered)    Or  methocarbamol (ROBAXIN) 500 mg in dextrose 5 % 50 mL IVPB (not administered)  0.9 %  sodium chloride infusion ( Intravenous New Bag/Given 10/27/16 2325)  polyethylene glycol (MIRALAX / GLYCOLAX) packet 17 g (not administered)  morphine 4 MG/ML injection 1-2 mg (not administered)  ondansetron (ZOFRAN) injection 4 mg (4 mg Intravenous Given 10/27/16 1842)  morphine 4 MG/ML injection 2 mg (2 mg Intravenous Given 10/27/16 1842)  phytonadione (VITAMIN K) tablet 5 mg (5 mg Oral Given 10/27/16 2215)    Merideth Abbey MARGAREE SANDHU is 83 y.o. female presenting with Mechanical fall at SNF with left hip fracture. She is seen Dr. Alvan Dame in the past, discussed with Dr. Stann Mainland who is come to evaluate the patient. She is anticoagulated with Coumadin, he would like for her INR to naturally reduced secondary to holding Coumadin rather than reversing. Plan is for Dr. Ihor Gully to perform the replacement on Sunday admitted to [facility] hospitalist.   Final Clinical Impressions(s) / ED Diagnoses   Final diagnoses:  Closed fracture of left hip, initial encounter [facility])    New Prescriptions New Prescriptions    No medications on file     Waynetta Pean 10/27/16 [place],  Nicki Reaper, MD 10/28/16 0097

## 2016-10-27 NOTE — Assessment & Plan Note (Signed)
Will update PT/INR in setting of feft hip/thigh pain and unable to bear weight(trauma) from from falling.

## 2016-10-27 NOTE — H&P (Addendum)
Patricia Nelson HEN:[id number] DOB: [birth year] DOA: 10/27/2016     PCP: Blanchie Serve, MD   Outpatient Specialists: Janan Ridge, Card[facility] Patient coming from  From facility SNF memory unit Patricia Nelson  Chief Complaint: fall  HPI: Patricia Nelson is a 83 y.o. female with medical history significant of anemia, chronic kidney disease, depression, HTN, HLD, macular degeneration, osteoporosis, atrial fibrillation chronic anticoagulant  Right total hip replacement  Presented with fall today resulting in severe pain of her left hip and upper thigh unable to bear weight since the fall, She was walking with a walker when she suddenly fell, no Syncope, no chest pain.  Noted to have rotation of the left leg she is unsure how she fell but denies any chest pain syncopefevers noted. Per nursing home staff she was found today on the floor is unclear if she has had her head or not because patient unable to provide history. She is able to ambulate with a walker and recognizes her family   Regarding pertinent Chronic problems: History of atrial fibrillation occurred about 1 month agoon diltiazem by systolic and Coumadin with therapeutic INR Reported history of CHF but no echo gram system. No hx of CVA or DM, no CAD  IN ER:  Temp (24hrs), Avg:97.9 F (36.6 C), Min:97.9 F (36.6 C), Max:97.9 F (36.6 C)      on arrival  ED Triage Vitals [10/27/16 1730]  Enc Vitals Group     BP (!) 165/90     Pulse Rate (!) 52     Resp (!) 25     Temp      Temp src      SpO2 97 %     Weight      Height      Head Circumference      Peak Flow      Pain Score      Pain Loc      Pain Edu?      Excl. in [place]?     Latest RR 19 100% HR 46 BP 170/73 WBC 9.6 HG 12.4 plt 174 NA 141 K 3.7 BUN 27 Cr 0.91 INR 3.06 CT head and neck - non acute LEFT hip displace fracture left femoral neck  CXR  Cardiomegaly COPD  Following Medications were ordered in ER: Medications  ondansetron (ZOFRAN)  injection 4 mg (4 mg Intravenous Given 10/27/16 1842)  morphine 4 MG/ML injection 2 mg (2 mg Intravenous Given 10/27/16 1842)     ER provider discussed case with:  Dr. Stann Mainland with Orthopedics Who recommends: reconsulting them when patient INR have normalized     Hospitalist was called for admission for LEft hip femoral frx  Review of Systems:    Pertinent positives include:fall, confusion  Constitutional:  No weight loss, night sweats, Fevers, chills, fatigue, weight loss  HEENT:  No headaches, Difficulty swallowing,Tooth/dental problems,Sore throat,  No sneezing, itching, ear ache, nasal congestion, post nasal drip,  Cardio-vascular:  No chest pain, Orthopnea, PND, anasarca, dizziness, palpitations.no Bilateral lower extremity swelling  GI:  No heartburn, indigestion, abdominal pain, nausea, vomiting, diarrhea, change in bowel habits, loss of appetite, melena, blood in stool, hematemesis Resp:  no shortness of breath at rest. No dyspnea on exertion, No excess mucus, no productive cough, No non-productive cough, No coughing up of blood.No change in color of mucus.No wheezing. Skin:  no rash or lesions. No jaundice GU:  no dysuria, change in color of urine, no urgency or frequency. No  straining to urinate.  No flank pain.  Musculoskeletal:  No joint pain or no joint swelling. No decreased range of motion. No back pain.  Psych:  No change in mood or affect. No depression or anxiety. No memory loss.  Neuro: no localizing neurological complaints, no tingling, no weakness, no double vision, no gait abnormality, no slurred speech   As per HPI otherwise 10 point review of systems negative.   Past Medical History: Past Medical History:  Diagnosis Date  . Anemia, unspecified   . Anorexia   . Anxiety   . Arthritis   . Atherosclerotic cerebrovascular disease   . Backache, unspecified   . Cataract   . Cholelithiasis   . Chronic kidney disease, unspecified   . Closed fracture of  right inferior pubic ramus ([place]) 11/12/2013   11/10/13 s/p fall, ED eval  X-ray R hip  1. Possible nondisplaced right inferior pubic ramus fracture.  2. No femur fracture or dislocation   02/06/14 dc prn Motrin and Norco-not used    . Depression   . Diaphragmatic hernia without mention of obstruction or gangrene   . Diseases of lips 03/26/2013  . Diverticulosis of colon (without mention of hemorrhage)   . Hemorrhage of rectum and anus 03/26/2013  . Hyperlipidemia   . Hypertension   . Insomnia, unspecified   . Laceration of occipital scalp 11/12/2013  . Macular degeneration 06/28/2013  . Macular degeneration (senile) of retina, unspecified   . Osteoporosis   . Other malaise and fatigue   . Other sleep disturbances   . Other specified cardiac dysrhythmias(427.89)   . Other specified personal history presenting hazards to health(V15.89)   . Other symptoms involving cardiovascular system   . Pancreatitis   . Personal history of other diseases of circulatory system   . Personal history of other diseases of digestive system    pancreatitis from gallstones  . Personal history of other diseases of respiratory system   . Personal history of traumatic fracture   . Rosacea   . Senile osteoporosis    with old T11 fracture  . Thyroid disease   . Unspecified disorders of arteries and arterioles   . Unspecified hearing loss   . Unspecified hemorrhoids without mention of complication    Past Surgical History:  Procedure Laterality Date  . ABDOMINAL HYSTERECTOMY     and BSO fibroids  . COLONOSCOPY  05/23/2007  . JOINT REPLACEMENT  1996  . TOTAL HIP ARTHROPLASTY Right      Social History:  Ambulatory walker       reports that she has never smoked. She has never used smokeless tobacco. She reports that she does not drink alcohol or use drugs.  Allergies:   Allergies  Allergen Reactions  . Ace Inhibitors     cough  . Mirtazapine     Nightmares   . Penicillins Rash       Family  History:   Family History  Problem Relation Age of Onset  . Heart disease Mother   . Diabetes Mother   . Heart disease Father   . Diabetes Sister   . Diabetes Brother   . Diabetes Daughter     Medications: Prior to Admission medications   Medication Sig Start Date End Date Taking? Authorizing Provider  acetaminophen (TYLENOL) 325 MG tablet Take 650 mg by mouth every 6 (six) hours as needed.    [provider]  acetaminophen (TYLENOL) 500 MG tablet Take 500 mg by mouth 2 (two) times daily.  [provider]  aspirin 81 MG tablet Take 81 mg by mouth daily.    [provider]  BYSTOLIC 10 MG tablet Take one tablet daily for blood pressusre 09/24/13   [provider]  clindamycin (CLEOCIN) 150 MG capsule Take 4(1000) capsule by mouth 1 hr prior to dental appointment.    [provider]  diltiazem (CARDIZEM) 30 MG tablet Take 30 mg by mouth 4 (four) times daily.    [provider]  escitalopram (LEXAPRO) 5 MG tablet Take 5 mg by mouth daily.    [provider]  furosemide (LASIX) 20 MG tablet Take 10 mg by mouth daily.    [provider]  hydrocortisone cream 1 % Apply 1 application topically 2 (two) times daily. To hemorriods    [provider]  levothyroxine (SYNTHROID, LEVOTHROID) 25 MCG tablet Take 25 mcg by mouth daily before breakfast.     [provider]  Lidocaine (ASPERCREME LIDOCAINE) 4 % PTCH Apply topically. Apply patch every night(8:00pm)  to lower back, and remove at (8:00 am).    [provider]  memantine (NAMENDA XR) 28 MG CP24 24 hr capsule Take 28 mg by mouth.    [provider]  Nutritional Supplements (BENECALORIE PO) Take 1 Container by mouth daily.    [provider]  polyethylene glycol (MIRALAX / GLYCOLAX) packet Take 17 g by mouth every 3 (three) days.     [provider]  polyvinyl alcohol (LIQUIFILM TEARS) 1.4 % ophthalmic solution Place 1  drop into both eyes 4 (four) times daily.     [provider]  potassium chloride (K-DUR,KLOR-CON) 10 MEQ tablet Take 10 mEq by mouth daily.    [provider]  warfarin (COUMADIN) 2 MG tablet Take 2 mg by mouth daily.    [provider]    Physical Exam: Patient Vitals for the past 24 hrs:  BP Pulse Resp SpO2  10/27/16 1900 (!) 170/73 (!) 46 19 100 %  10/27/16 1830 - (!) 44 13 95 %  10/27/16 1800 (!) 172/78 92 20 97 %  10/27/16 1730 (!) 165/90 (!) 52 (!) 25 97 %    1. General:  in No Acute distress Frail -appearing 2. Psychological: Alert but not Oriented 3. Head/ENT:     Dry Mucous Membranes                          Head Non traumatic, neck supple                            Poor Dentition 4. SKIN:    decreased Skin turgor,  Skin clean Dry and intact no rash 5. Heart: Regular rate and rhythm no  Murmur, no Rub or gallop 6. Lungs:  no wheezes or crackles   7. Abdomen: Soft,  non-tender, Non distended bowel sounds present 8. Lower extremities: no clubbing, cyanosis, or edema   left lower extremity externally rotated and shortened 9. Neurologically Grossly intact, moving all 4 extremities equally  10. MSK: Normal range of motion except left leg   body mass index is unknown because there is no height or weight on file.  Labs on Admission:   Labs on Admission: I have personally reviewed following labs and imaging studies  CBC:  Recent Labs Lab 10/27/16 1715  WBC 9.6  NEUTROABS 8.4*  HGB 12.4  HCT 39.1  MCV 94.7  PLT 174  Basic Metabolic Panel:  Recent Labs Lab 10/27/16 1715  NA 141  K 3.7  CL 110  CO2 22  GLUCOSE 138*  BUN 27*  CREATININE 0.91  CALCIUM 9.3   GFR: Estimated Creatinine Clearance: 24.2 mL/min (by C-G formula based on SCr of 0.91 mg/dL). Liver Function Tests: No results for input(s): AST, ALT, ALKPHOS, BILITOT, PROT, ALBUMIN in the last 168 hours. No results for input(s): LIPASE, AMYLASE in the last 168 hours. No  results for input(s): AMMONIA in the last 168 hours. Coagulation Profile:  Recent Labs Lab 10/21/16 10/27/16 1715  INR 2.4* 3.06   Cardiac Enzymes: No results for input(s): CKTOTAL, CKMB, CKMBINDEX, TROPONINI in the last 168 hours. BNP (last 3 results) No results for input(s): PROBNP in the last 8760 hours. HbA1C: No results for input(s): HGBA1C in the last 72 hours. CBG: No results for input(s): GLUCAP in the last 168 hours. Lipid Profile: No results for input(s): CHOL, HDL, LDLCALC, TRIG, CHOLHDL, LDLDIRECT in the last 72 hours. Thyroid Function Tests: No results for input(s): TSH, T4TOTAL, FREET4, T3FREE, THYROIDAB in the last 72 hours. Anemia Panel: No results for input(s): VITAMINB12, FOLATE, FERRITIN, TIBC, IRON, RETICCTPCT in the last 72 hours. Urine analysis:  Sepsis Labs: @LABRCNTIP (procalcitonin:4,lacticidven:4) )No results found for this or any previous visit (from the past 240 hour(s)).     UA  ordered  No results found for: HGBA1C  Estimated Creatinine Clearance: 24.2 mL/min (by C-G formula based on SCr of 0.91 mg/dL).  BNP (last 3 results) No results for input(s): PROBNP in the last 8760 hours.   ECG REPORT  Independently reviewed Rate: 92  Rhythm: SR ST&T Change: No acute ischemic changes   QTC 425  There were no vitals filed for this visit.   Cultures: No results found for: SDES, SPECREQUEST, CULT, REPTSTATUS   Radiological Exams on Admission: Dg Chest 1 View  Result Date: 10/27/2016 CLINICAL DATA:  Fall from standing.  LEFT hip pain. EXAM: CHEST 1 VIEW COMPARISON:  Chest radiograph February 20, 2009 FINDINGS: Cardiac silhouette is moderately enlarged. Calcified aortic knob. Similar chronic interstitial changes. Hyperinflation. Calcified granulomas. No pleural effusion or focal consolidation. Apical pleural thickening is similar. No pneumothorax. Osteopenia. Old T8 and T11 compression fractures. IMPRESSION: Stable cardiomegaly and COPD. Aortic  Atherosclerosis (ICD10-I70.0). Electronically Signed   By: Elon Alas M.D.   On: 10/27/2016 18:14   Ct Head Wo Contrast  Result Date: 10/27/2016 CLINICAL DATA:  Fall from standing position with left hip fracture EXAM: CT HEAD WITHOUT CONTRAST CT CERVICAL SPINE WITHOUT CONTRAST TECHNIQUE: Multidetector CT imaging of the head and cervical spine was performed following the standard protocol without intravenous contrast. Multiplanar CT image reconstructions of the cervical spine were also generated. COMPARISON:  11/10/2013 FINDINGS: CT HEAD FINDINGS Brain: No acute territorial infarction, hemorrhage, or intracranial mass is seen. Marked atrophy. Mild small vessel ischemic changes of the white matter. Stable small left sub insula hypodensities. Stable enlarged ventricles, felt secondary to atrophy. Vascular: No hyperdense vessels. Carotid artery calcifications. Vertebral artery calcification. Skull: No fracture Sinuses/Orbits: Mild mucosal thickening within the ethmoid sinuses. No acute orbital abnormality. Other: None CT CERVICAL SPINE FINDINGS Alignment: Trace retrolisthesis of C5 on C6, unchanged. Trace anterolisthesis C3 and C4 unchanged. Facet alignment within normal limits. Skull base and vertebrae: No acute fracture. No primary bone lesion or focal pathologic process. Soft tissues and spinal canal: No prevertebral fluid or swelling. No visible canal hematoma. Disc levels: Moderate severe degenerative changes at C4-C5, C5-C6 and C6-C7. Bilateral  foraminal stenosis C4 through C7, most notable at C5 C6. Upper chest: Apical scarring.  Carotid artery calcification. Other: None IMPRESSION: 1. No CT evidence for acute intracranial abnormality. Atrophy and small vessel ischemic changes of the white matter 2. Stable cervical spine alignment with degenerative changes. No acute osseous abnormality Electronically Signed   By: Donavan Foil M.D.   On: 10/27/2016 18:54   Ct Cervical Spine Wo Contrast  Result Date:  10/27/2016 CLINICAL DATA:  Fall from standing position with left hip fracture EXAM: CT HEAD WITHOUT CONTRAST CT CERVICAL SPINE WITHOUT CONTRAST TECHNIQUE: Multidetector CT imaging of the head and cervical spine was performed following the standard protocol without intravenous contrast. Multiplanar CT image reconstructions of the cervical spine were also generated. COMPARISON:  11/10/2013 FINDINGS: CT HEAD FINDINGS Brain: No acute territorial infarction, hemorrhage, or intracranial mass is seen. Marked atrophy. Mild small vessel ischemic changes of the white matter. Stable small left sub insula hypodensities. Stable enlarged ventricles, felt secondary to atrophy. Vascular: No hyperdense vessels. Carotid artery calcifications. Vertebral artery calcification. Skull: No fracture Sinuses/Orbits: Mild mucosal thickening within the ethmoid sinuses. No acute orbital abnormality. Other: None CT CERVICAL SPINE FINDINGS Alignment: Trace retrolisthesis of C5 on C6, unchanged. Trace anterolisthesis C3 and C4 unchanged. Facet alignment within normal limits. Skull base and vertebrae: No acute fracture. No primary bone lesion or focal pathologic process. Soft tissues and spinal canal: No prevertebral fluid or swelling. No visible canal hematoma. Disc levels: Moderate severe degenerative changes at C4-C5, C5-C6 and C6-C7. Bilateral foraminal stenosis C4 through C7, most notable at C5 C6. Upper chest: Apical scarring.  Carotid artery calcification. Other: None IMPRESSION: 1. No CT evidence for acute intracranial abnormality. Atrophy and small vessel ischemic changes of the white matter 2. Stable cervical spine alignment with degenerative changes. No acute osseous abnormality Electronically Signed   By: Donavan Foil M.D.   On: 10/27/2016 18:54   Dg Hip Unilat With Pelvis 2-3 Views Left  Result Date: 10/27/2016 CLINICAL DATA:  Fall EXAM: DG HIP (WITH OR WITHOUT PELVIS) 2-3V LEFT COMPARISON:  None. FINDINGS: Fracture left femoral  neck with mild displacement. Normal left hip joint Chronic fracture right pubic rami. Right hip hemiarthroplasty. Lumbar disc degeneration IMPRESSION: Displaced fracture left femoral neck Electronically Signed   By: Franchot Gallo M.D.   On: 10/27/2016 18:13    Chart has been reviewed    Assessment/Plan  83 y.o. female with medical history significant of anemia, chronic kidney disease, depression, HTN, HLD, macular degeneration, osteoporosis, atrial fibrillation chronic anticoagulant  Right total hip replacement Admitted for  Left hip femoral frx  Present on Admission: . Hip fracture ([place]) -  - management as per orthopedics,  plan to operate  On Sunday  Allow diet Patient   on anticoagulation on hold Ordered type and screen, Place Foley, order a vitamin D level  Patient at baseline unable to walk a flight of stairs due to frailty but able to walk  100 feet     Patient denies any chest pain or shortness of breath currently and/or with exertion,    ECG showing no evidence of acute ischemia  no known history of coronary artery disease, COPD Liver failure  History of  CKD currently appears to be stable  Given advanced age patient is at least moderate  Risk  given cardiomegaly episode of bradycardia we'll monitor on telemetry obtain echo gram   will order echo   cycle CE  Pending results may need cardiology for further clearance   .  CKD (chronic kidney disease) stage 3, GFR 30-59 ml/min -currently appears to be at baseline continue to monitor would avoid nephrotoxic medications . Fall -possibly secondary to spontaneous hip fracture patient is still ambulates at baseline with walker. May be at risk of repeated falls . HTN (hypertension) stable continue home medications with holding parameters . Hypercholesteremia continue home medications currently stable . Hypothyroidism continue Synthroid once and by mouth need to switch to IV . Bradycardia transient currently resolved restart home  medications with hold parameters   Dementia expect some degree of sundowning while hospitalized  . A-fib ([place]) -          - CHA2DS2 vas score 4: hold current anticoagulation with   Coumadin  She need of surgical intervention. We'll administer vitamin K 5 mg by mouth Given advanced age and a fall is cost the family possibility of discontinuing anticoagulation permanently given increased risk of repeated falls and instability         -  Rate control:  Currently controlled with  Diltiazem, and Zebeta will continue with holding parameters given an episode of bradycardia in the ER   Family history of possible CHF will obtain echogram to evaluate prior to or  Other plan as per orders.  DVT prophylaxis:  SCD      Code Status:   DNR/DNI As per family and reccords  Family Communication:   Family   at  Bedside  plan of care was discussed with   Daughter, and son in law Disposition Plan:                             Back to current facility when stable                                                 Would benefit from PT/OT eval prior to DC  Order prior to DC                    Social Work   consulted                          Consults called: Orthopedics Dr. Stann Mainland has seen patient in ER please notify them when INR has stabilized,  plan  for Dr. Alvan Dame  to take patient to OR on Sunday  Admission status:  inpatient       Level of care    tele       I have spent a total of 56 min on this admission  Herman Fiero 10/27/2016, 11:55 PM    [facility]  Pager [phone]   after 2 AM please page floor coverage PA If 7AM-7PM, please contact the day team taking care of the patient  [email or web address]  Password [email or web address]

## 2016-10-27 NOTE — ED Triage Notes (Signed)
To ED via [facility] for eval of fall from standing position this am. Confirmed left hip fx via portable xray at facility. Pt with dementia. No further noted injury. Family with pt.

## 2016-10-27 NOTE — ED Notes (Signed)
Provided family with snack and water while they wait for patient to return from radiology.

## 2016-10-27 NOTE — Progress Notes (Signed)
Location:    [facility]ursing Home Room Number: 107 Place of Service:  SNF (31) Provider: Lennie Odor Sebastain Fishbaugh NP  Blanchie Serve, MD  Patient Care Team: Blanchie Serve, MD as PCP - General (Internal Medicine) Delvecchio Madole X, NP as Nurse Practitioner (Nurse Practitioner) Wilford Corner, MD as Consulting Physician (Gastroenterology) Sanda Klein, MD as Consulting Physician (Cardiology)  Extended Emergency Contact Information Primary Emergency Contact: [name],Pat Address: [place] [address] [name] [place] Phone: [phone] Mobile Phone: [phone] Relation: Daughter  Code Status: DNR Goals of care: Advanced Directive information Advanced Directives 10/27/2016  Does Patient Have a Medical Advance Directive? Yes  Type of Paramedic of [place];Out of facility DNR (pink MOST or yellow form)  Does patient want to make changes to medical advance directive? No - Patient declined  Copy of [place] in Chart? Yes  Pre-existing out of facility DNR order (yellow form or pink MOST form) Yellow form placed in chart (order not valid for inpatient use)     Chief Complaint  Patient presents with  . Acute Visit    Fell on 10/27/16, c/o left thigh pain.    HPI:  Pt is a 83 y.o. female seen today for an acute visit for the patient sustained a severe pain in her left hip and upper thigh area from falling today, unable to bearing weight or internal or external rotating her left leg. She has recollection of how she fell, she denied chest pain, dizziness, change of vision, she is afebrile, no noted focal weakness, no O2 desaturation.   She has history of recent onset Afib, heart rate is in control, taking Diltiazem 30mg  qid, Bystolic 10mg  qd. She takes Coumadin for thromboembolic risk reduction, INR has been therapeutic.     Past Medical History:  Diagnosis Date  . Anemia, unspecified   . Anorexia   . Anxiety   . Arthritis    . Atherosclerotic cerebrovascular disease   . Backache, unspecified   . Cataract   . Cholelithiasis   . Chronic kidney disease, unspecified   . Closed fracture of right inferior pubic ramus ([place]) 11/12/2013   11/10/13 s/p fall, ED eval  X-ray R hip  1. Possible nondisplaced right inferior pubic ramus fracture.  2. No femur fracture or dislocation   02/06/14 dc prn Motrin and Norco-not used    . Depression   . Diaphragmatic hernia without mention of obstruction or gangrene   . Diseases of lips 03/26/2013  . Diverticulosis of colon (without mention of hemorrhage)   . Hemorrhage of rectum and anus 03/26/2013  . Hyperlipidemia   . Hypertension   . Insomnia, unspecified   . Laceration of occipital scalp 11/12/2013  . Macular degeneration 06/28/2013  . Macular degeneration (senile) of retina, unspecified   . Osteoporosis   . Other malaise and fatigue   . Other sleep disturbances   . Other specified cardiac dysrhythmias(427.89)   . Other specified personal history presenting hazards to health(V15.89)   . Other symptoms involving cardiovascular system   . Pancreatitis   . Personal history of other diseases of circulatory system   . Personal history of other diseases of digestive system    pancreatitis from gallstones  . Personal history of other diseases of respiratory system   . Personal history of traumatic fracture   . Rosacea   . Senile osteoporosis    with old T11 fracture  . Thyroid disease   .  Unspecified disorders of arteries and arterioles   . Unspecified hearing loss   . Unspecified hemorrhoids without mention of complication    Past Surgical History:  Procedure Laterality Date  . ABDOMINAL HYSTERECTOMY     and BSO fibroids  . COLONOSCOPY  05/23/2007  . JOINT REPLACEMENT  1996  . TOTAL HIP ARTHROPLASTY Right     Allergies  Allergen Reactions  . Ace Inhibitors     cough  . Mirtazapine     Nightmares   . Penicillins Rash    Allergies as of 10/27/2016      Reactions    Ace Inhibitors    cough   Mirtazapine    Nightmares   Penicillins Rash      Medication List       Accurate as of 10/27/16  4:30 PM. Always use your most recent med list.          acetaminophen 325 MG tablet Commonly known as:  TYLENOL Take 650 mg by mouth every 6 (six) hours as needed.   acetaminophen 500 MG tablet Commonly known as:  TYLENOL Take 500 mg by mouth 2 (two) times daily.   ASPERCREME LIDOCAINE 4 % Ptch Generic drug:  Lidocaine Apply topically. Apply patch every night(8:00pm)  to lower back, and remove at (8:00 am).   aspirin 81 MG tablet Take 81 mg by mouth daily.   BENECALORIE PO Take 1 Container by mouth daily.   BYSTOLIC 10 MG tablet Generic drug:  nebivolol Take one tablet daily for blood pressusre   clindamycin 150 MG capsule Commonly known as:  CLEOCIN Take 4(1000) capsule by mouth 1 hr prior to dental appointment.   diltiazem 30 MG tablet Commonly known as:  CARDIZEM Take 30 mg by mouth 4 (four) times daily.   escitalopram 5 MG tablet Commonly known as:  LEXAPRO Take 5 mg by mouth daily.   furosemide 20 MG tablet Commonly known as:  LASIX Take 10 mg by mouth daily.   hydrocortisone cream 1 % Apply 1 application topically 2 (two) times daily. To hemorriods   levothyroxine 25 MCG tablet Commonly known as:  SYNTHROID, LEVOTHROID Take 25 mcg by mouth daily before breakfast.   NAMENDA XR 28 MG Cp24 24 hr capsule Generic drug:  memantine Take 28 mg by mouth.   polyethylene glycol packet Commonly known as:  MIRALAX / GLYCOLAX Take 17 g by mouth every 3 (three) days.   polyvinyl alcohol 1.4 % ophthalmic solution Commonly known as:  LIQUIFILM TEARS Place 1 drop into both eyes 4 (four) times daily.   potassium chloride 10 MEQ tablet Commonly known as:  K-DUR,KLOR-CON Take 10 mEq by mouth daily.   warfarin 2 MG tablet Commonly known as:  COUMADIN Take 2 mg by mouth daily.      ROS was provided with assistance of staff Review of  Systems  Constitutional: Negative for activity change, appetite change, chills, diaphoresis, fatigue and fever.  HENT: Positive for hearing loss. Negative for congestion, trouble swallowing and voice change.   Eyes: Negative for pain and visual disturbance.  Respiratory: Negative for cough, choking, chest tightness, shortness of breath and wheezing.   Cardiovascular: Negative for chest pain, palpitations and leg swelling.  Gastrointestinal: Negative for abdominal distention, abdominal pain, constipation, diarrhea, nausea and vomiting.  Genitourinary: Negative for dysuria and hematuria.  Musculoskeletal: Positive for gait problem.       Left hip and upper thigh pain since the fall today.   Skin: Negative for color change, pallor,  rash and wound.  Neurological: Negative for dizziness, speech difficulty, weakness and headaches.       Dementia  Psychiatric/Behavioral: Positive for confusion. Negative for agitation and behavioral problems. The patient is not nervous/anxious.     Immunization History  Administered Date(s) Administered  . DTaP 10/12/2012  . Influenza-Unspecified 11/22/2012, 12/26/2013, 11/12/2014, 12/09/2015  . PPD Test 12/06/2013  . Pneumococcal Polysaccharide-23 09/22/2000  . Td 07/08/2005  . Tdap 11/10/2013   Pertinent  Health Maintenance Due  Topic Date Due  . INFLUENZA VACCINE  11/22/2016 (Originally 09/22/2016)  . PNA vac Low Risk Adult (2 of 2 - PCV13) 02/22/2017 (Originally 09/22/2001)  . DEXA SCAN  Completed   Fall Risk  10/22/2016 01/13/2015 06/28/2013  Falls in the past year? No No No  Risk for fall due to : - Impaired balance/gait -   Functional Status Survey:    Vitals:   10/27/16 1408  BP: (!) 142/70  Pulse: 97  Resp: 18  Temp: 97.9 F (36.6 C)  SpO2: 97%  Weight: 117 lb 3.2 oz (53.2 kg)  Height: 5' (1.524 m)   Body mass index is 22.89 kg/m. Physical Exam  Constitutional: She appears well-developed and well-nourished. No distress.  HENT:  Head:  Normocephalic and atraumatic.  Eyes: Pupils are equal, round, and reactive to light. Conjunctivae and EOM are normal.  Neck: Normal range of motion. Neck supple. No JVD present.  Cardiovascular: Normal rate and regular rhythm.   Murmur heard. Systolic heart murmur 2/6  Pulmonary/Chest: Effort normal and breath sounds normal.  Abdominal: Soft. Bowel sounds are normal. She exhibits no distension. There is no tenderness.  Musculoskeletal: She exhibits tenderness.  Severe pain in the left hip and upper thigh region, unable to bear wight and rotation left leg.   Neurological: She is alert. No cranial nerve deficit. She exhibits normal muscle tone.  Oriented to self.   Skin: Skin is warm and dry. She is not diaphoretic. No pallor.  Psychiatric: She has a normal mood and affect.    Labs reviewed:  Recent Labs  08/31/16 09/07/16 09/14/16  NA 142 143 143  K 4.2 4.1 3.8  BUN 30* 36*  36* 39*  CREATININE 0.9 1.3* 1.4*    Recent Labs  06/10/16 06/29/16 08/31/16  AST 24 29 19   ALT 11 14 12   ALKPHOS 67 50 70    Recent Labs  06/10/16 06/29/16 08/31/16  WBC 5.3 4.8 4.6  HGB 12.3 11.8* 11.4*  HCT 35* 34* 34*  PLT 214 194 220   Lab Results  Component Value Date   TSH 0.92 09/14/2016   No results found for: HGBA1C No results found for: CHOL, HDL, LDLCALC, LDLDIRECT, TRIG, CHOLHDL  Significant Diagnostic Results in last 30 days:  No results found.  Assessment/Plan: Acute pain of left thigh Sustained pain in her left hip and upper thigh area from falling today, unable to bearing weight or internal or external rotation. X-ray anterior posterior and lateral viewed to r/u fractures. Non weight bearing left leg for now.   Fall Staff reported she was found on floor, she denied headache, dizziness, chest pain, palpitation, or focal weakness. She is taking Coumadin for Afib, will update CBC, PT/INR   Anticoagulation goal of INR 2 to 3 Will update PT/INR in setting of feft hip/thigh  pain and unable to bear weight(trauma) from from falling.  A-fib (HCC) Heart rate is in control, continue Diltiazem 30mg  po qid, Bystolic 10mg  po daily.     Family/ staff Communication:  plan of care reviewed with the patient, HPOA daughter, and charge nurse. ED to eval and treat if fracture presents.   Time spend 45 minutes.   Labs/tests ordered: X-ray Left hip, left femur ap and lateral views, PT/INR and CBC

## 2016-10-27 NOTE — Assessment & Plan Note (Signed)
Heart rate is in control, continue Diltiazem 30mg  po qid, Bystolic 10mg  po daily.

## 2016-10-28 ENCOUNTER — Inpatient Hospital Stay (HOSPITAL_COMMUNITY): Payer: PPO

## 2016-10-28 ENCOUNTER — Encounter (HOSPITAL_COMMUNITY): Payer: Self-pay | Admitting: *Deleted

## 2016-10-28 DIAGNOSIS — I340 Nonrheumatic mitral (valve) insufficiency: Secondary | ICD-10-CM

## 2016-10-28 DIAGNOSIS — I482 Chronic atrial fibrillation: Secondary | ICD-10-CM

## 2016-10-28 DIAGNOSIS — Z01810 Encounter for preprocedural cardiovascular examination: Secondary | ICD-10-CM

## 2016-10-28 DIAGNOSIS — R748 Abnormal levels of other serum enzymes: Secondary | ICD-10-CM

## 2016-10-28 DIAGNOSIS — S72002A Fracture of unspecified part of neck of left femur, initial encounter for closed fracture: Principal | ICD-10-CM

## 2016-10-28 DIAGNOSIS — I4891 Unspecified atrial fibrillation: Secondary | ICD-10-CM

## 2016-10-28 DIAGNOSIS — I672 Cerebral atherosclerosis: Secondary | ICD-10-CM | POA: Insufficient documentation

## 2016-10-28 LAB — TROPONIN I
Troponin I: <0.03 ng/mL (ref <0.03)
Troponin I: 0.06 ng/mL (ref <0.03)
Troponin I: 0.09 ng/mL (ref <0.03)

## 2016-10-28 LAB — TYPE AND SCREEN
ABO/RH(D): O POS
ANTIBODY SCREEN: NEGATIVE

## 2016-10-28 LAB — CBC
HEMATOCRIT: 37.5 % (ref 36.0–46.0)
Hemoglobin: 12.1 g/dL (ref 12.0–15.0)
MCH: 30.6 pg (ref 26.0–34.0)
MCHC: 32.3 g/dL (ref 30.0–36.0)
MCV: 94.7 fL (ref 78.0–100.0)
Platelets: 174 10*3/uL (ref 150–400)
RBC: 3.96 MIL/uL (ref 3.87–5.11)
RDW: 14.5 % (ref 11.5–15.5)
WBC: 10.2 10*3/uL (ref 4.0–10.5)

## 2016-10-28 LAB — URINALYSIS, ROUTINE W REFLEX MICROSCOPIC
Bilirubin Urine: NEGATIVE
Glucose, UA: NEGATIVE mg/dL
HGB URINE DIPSTICK: NEGATIVE
Ketones, ur: NEGATIVE mg/dL
Nitrite: POSITIVE — AB
PROTEIN: 100 mg/dL — AB
Specific Gravity, Urine: 1.021 (ref 1.005–1.030)
pH: 6 (ref 5.0–8.0)

## 2016-10-28 LAB — BASIC METABOLIC PANEL
ANION GAP: 7 (ref 5–15)
BUN: 24 mg/dL — AB (ref 6–20)
CO2: 21 mmol/L — ABNORMAL LOW (ref 22–32)
Calcium: 9 mg/dL (ref 8.9–10.3)
Chloride: 111 mmol/L (ref 101–111)
Creatinine, Ser: 0.85 mg/dL (ref 0.44–1.00)
GFR calc Af Amer: >60 mL/min (ref >60)
GFR calc non Af Amer: 55 mL/min — ABNORMAL LOW (ref >60)
GLUCOSE: 132 mg/dL — AB (ref 65–99)
POTASSIUM: 3.7 mmol/L (ref 3.5–5.1)
Sodium: 139 mmol/L (ref 135–145)

## 2016-10-28 LAB — ABO/RH: ABO/RH(D): O POS

## 2016-10-28 LAB — PROTIME-INR
INR: 3.06
Prothrombin Time: 31.4 seconds — ABNORMAL HIGH (ref 11.4–15.2)

## 2016-10-28 LAB — ALBUMIN: ALBUMIN: 3 g/dL — AB (ref 3.5–5.0)

## 2016-10-28 LAB — TSH: TSH: 2.048 u[IU]/mL (ref 0.350–4.500)

## 2016-10-28 MED ORDER — ENSURE ENLIVE PO LIQD
237.0000 mL | Freq: Two times a day (BID) | ORAL | Status: DC
  Administered 2016-10-28 – 2016-11-03 (×11): 237 mL via ORAL

## 2016-10-28 MED ORDER — ENSURE ENLIVE PO LIQD: 237.0000 mL | Freq: Two times a day (BID) | ORAL | Status: DC

## 2016-10-28 MED ORDER — VITAMIN K1 10 MG/ML IJ SOLN
5.0000 mg | Freq: Once | INTRAVENOUS | Status: AC
  Administered 2016-10-28: 5 mg via INTRAVENOUS
  Filled 2016-10-28: qty 0.5

## 2016-10-28 MED ORDER — ACETAMINOPHEN 500 MG PO TABS
1000.0000 mg | ORAL_TABLET | Freq: Three times a day (TID) | ORAL | Status: DC
  Administered 2016-10-28 – 2016-11-03 (×19): 1000 mg via ORAL
  Filled 2016-10-28 (×20): qty 2

## 2016-10-28 MED ORDER — MORPHINE SULFATE (PF) 4 MG/ML IV SOLN: 1.0000 mg | INTRAVENOUS | Status: DC | PRN

## 2016-10-28 NOTE — Progress Notes (Signed)
   10/28/16 0800  General Information  HPI Pt is a 83 y.o.femalewith medical history significant of anemia, chronic kidney disease, depression, dementia, HTN, HLD, macular degeneration, osteoporosis, atrial fibrillation chronic anticoagulant who presented to the ED with fall resulting in severe pain of her left hip and upper thigh. Pt plans to undergo surgery to repair left femoral neck fx, potentially on 9/9.  On 10/27/16, pt was unable to swallow (unable to perform task) when directed to drink water via straw.   Type of Study Bedside Swallow Evaluation  Diet Prior to this Study Regular;Thin liquids  Temperature Spikes Noted No  Respiratory Status Room air  History of Recent Intubation No  Behavior/Cognition Alert;Requires cueing  Oral Cavity Assessment WFL  Oral Care Completed by SLP No  Oral Cavity - Dentition Adequate natural dentition  Vision Impaired for self-feeding  Self-Feeding Abilities Total assist  Patient Positioning Upright in bed  Baseline Vocal Quality Normal  Volitional Cough Strong  Volitional Swallow Able to elicit  Pain Assessment  Pain Assessment No/denies pain  Oral Motor/Sensory Function  Overall Oral Motor/Sensory Function WFL  Thin Liquid  Thin Liquid WFL  Presentation Cup;Straw  Nectar Thick Liquid  Nectar Thick Liquid NT  Honey Thick Liquid  Honey Thick Liquid NT  Puree  Puree WFL  Solid  Solid [facility]  SLP Assessment  Clinical Impression Statement (ACUTE ONLY) Pt demonstrates impaired cognitive function secondary to baseline dementia and increased confusion following hip fx. She also has visual impairments due to macular degeneration. Given these deficits she has decreased awareness of her environment and needs increased assist with feeding. Her daughter was observed feeding her breakfast with no signs of aspiration, only slightly prolonged mastication and slow swallow response. SLP discussed importance of oral care with pts daughter and safe feeding  strategies. WIll plan to f/u next week after surgery to monitor for ongoing tolerance after surgery.   SLP Visit Diagnosis Dysphagia, unspecified (R13.10)  Impact on safety and function Mild aspiration risk  Other Related Risk Factors Cognitive impairment  Swallow Evaluation Recommendations  SLP Diet Recommendations Regular;Thin liquid  Liquid Administration via Cup;Straw  Medication Administration Whole meds with liquid  Supervision Staff to assist with self feeding;Full supervision/cueing for compensatory strategies  Compensations Slow rate;Small sips/bites  Postural Changes Seated upright at 90 degrees  Treatment Plan  Oral Care Recommendations Oral care BID  Treatment Recommendations Therapy as outlined in treatment plan below  Follow up Recommendations Skilled Nursing facility  Speech Therapy Frequency (ACUTE ONLY) min 1 x/week  Treatment Duration 1 week  Interventions Aspiration precaution training;Patient/family education;Trials of upgraded texture/liquids;Diet toleration management by SLP  Individuals Consulted  Consulted and Agree with Results and Recommendations Patient;Family member/caregiver  Progression Toward Goals  Progression toward goals Progressing toward goals  SLP Time Calculation  SLP Start Time (ACUTE ONLY) 1044  SLP Stop Time (ACUTE ONLY) 1058  SLP Time Calculation (min) (ACUTE ONLY) 14 min  SLP Evaluations  $ SLP Speech Visit 1 Visit  SLP Evaluations  $BSS Swallow 1 Procedure  $Swallowing Treatment 1 Procedure

## 2016-10-28 NOTE — Progress Notes (Signed)
Initial Nutrition Assessment  DOCUMENTATION CODES:   Not applicable  INTERVENTION:  Provide Ensure Enlive po BID, each supplement provides 350 kcal and 20 grams of protein.  Encourage adequate PO intake.   NUTRITION DIAGNOSIS:   Increased nutrient needs related to  (surgery) as evidenced by estimated needs.  GOAL:   Patient will meet greater than or equal to 90% of their needs  MONITOR:   PO intake, Supplement acceptance, Weight trends, Labs, Skin, I & O's  REASON FOR ASSESSMENT:   Low [place]    ASSESSMENT:   83 y.o. female with medical history significant of anemia, chronic kidney disease, depression, HTN, HLD, macular degeneration, osteoporosis, atrial fibrillation chronic anticoagulant Presented with fall today resulting in severe pain of her left hip and upper thigh unable to bear weight since the fall Left hip femoral frx  Pt was unavailable during attempted time of visit. RD unable to obtain most recent nutrition history. Weight has been stable per weight records. RD to order nutritional supplements to aid in adequate nutrition. Noted INR is above 3, per MD INR needs to be closer to 1.2 at time of surgery.  Unable to complete Nutrition-Focused physical exam at this time. RD to perform physical exam at next visit.   Labs and medications reviewed.   Diet Order:  Diet regular Room service appropriate? Yes; Fluid consistency: Thin  Skin:  Reviewed, no issues  Last BM:  Unknown  Height:   Ht Readings from Last 1 Encounters:  10/27/16 5' (1.524 m)    Weight:   Wt Readings from Last 1 Encounters:  10/27/16 117 lb 3.2 oz (53.2 kg)    Ideal Body Weight:  45.45 kg  BMI:  There is no height or weight on file to calculate BMI.  Estimated Nutritional Needs:   Kcal:  1250-1500  Protein:  50-60 grams  Fluid:  >/= 1.5 L/day  EDUCATION NEEDS:   No education needs identified at this time  Corrin Parker, MS, RD, LDN Pager # [phone] After hours/ weekend  pager # [phone]

## 2016-10-28 NOTE — Consult Note (Signed)
Cardiology Consultation:   Patient ID: Patricia Nelson; [id number]; Nov 24, 1915   Admit date: 10/27/2016 Date of Consult: 10/28/2016  Primary Care Provider: Blanchie Serve, MD Primary Cardiologist: Dr. Sallyanne Kuster 2014  Patient Profile:   Patricia Nelson is a 83 y.o. female with a hx of dementia, atrial fibrillation on coumadin, HTN, HLD, CKD, hypothyroidism, macular degeneration and anemia of chronic disease  who is being seen today for the evaluation of elevated troponin at the request of Dr. Florene Glen.  History of Present Illness:   [name] is from a skilled nursing facility in a memory care unit due to progressive dementia. She had a fall and is found to have left hip fracture. She is anticoagulated with coumadin due to atrial fibrillation. CT of the head showed no evidence of acute intracranial abnormality.   She is not able to provide much information due to dementia. At baseline she walks with a walker and dresses herself but needs assistance with all other activities. Her daughter provides information. The daughter states that the patient was walking with her walker and staff out toward the SNF entrance to be taken out to eat when she "just went down". There was no loss of consciousness. There were no complaints of chest pain or difficulty breathing. The daughter says that when she fractured her other hip in the past it was thought that the hip fractured first and led to the fall. She is suspicious that this is the case now. The daughter reports that the patient had an episode of pneumonia about 6 weeks ago with CHF and development of atrial fibrillation. This was handled at the SNF.   Per her last Nursing home note by Marlana Latus, NP on 09/21/2016 the patient was in afib with rates in the 110's and the pateint was not feeling very well but no chest pain, palpitations, SOB. The note includes  09/01/16 echocardiogram: mild aortic insufficiency, moderate mitral insufficiency, moderate TR/mold  PI with  moderate pulmonary hypertension, normal LV function. Her bystolic was continued, Amlodipine was discontinued and diltiazem 30 mg po QID was added for better heart rate control. Her CHF was clinically compensated. Furosemide continued. She has had an eyelid blepharoplasty surgery on 09/27/16 and was off anticoagulation for 6 days surrounding that.   She was seen by Dr. Sallyanne Kuster in 2014 for evaluation of syncope and was thought to have orthostatic hypotension. Her BP meds and diuretic were titrated down at the time. Carotid sinus hypersensitivity was also a differential diagnosis.   Significant findings: Troponins <0.03, <0.03, 0.06 SCr 0.91 INR 3.06 TSH 2.048 EKG: atrial flutter with variable AV conduction, 96 bpm   Past Medical History:  Diagnosis Date  . Anemia, unspecified   . Anorexia   . Anxiety   . Arthritis   . Atherosclerotic cerebrovascular disease   . Backache, unspecified   . Cataract   . Cholelithiasis   . Chronic kidney disease, unspecified   . Closed fracture of right inferior pubic ramus ([place]) 11/12/2013   11/10/13 s/p fall, ED eval  X-ray R hip  1. Possible nondisplaced right inferior pubic ramus fracture.  2. No femur fracture or dislocation   02/06/14 dc prn Motrin and Norco-not used    . Depression   . Diaphragmatic hernia without mention of obstruction or gangrene   . Diseases of lips 03/26/2013  . Diverticulosis of colon (without mention of hemorrhage)   . Hemorrhage of rectum and anus 03/26/2013  . Hyperlipidemia   . Hypertension   .  Insomnia, unspecified   . Laceration of occipital scalp 11/12/2013  . Macular degeneration 06/28/2013  . Macular degeneration (senile) of retina, unspecified   . Osteoporosis   . Other malaise and fatigue   . Other sleep disturbances   . Other specified cardiac dysrhythmias(427.89)   . Other specified personal history presenting hazards to health(V15.89)   . Other symptoms involving cardiovascular system   . Pancreatitis   .  Personal history of other diseases of circulatory system   . Personal history of other diseases of digestive system    pancreatitis from gallstones  . Personal history of other diseases of respiratory system   . Personal history of traumatic fracture   . Rosacea   . Senile osteoporosis    with old T11 fracture  . Thyroid disease   . Unspecified disorders of arteries and arterioles   . Unspecified hearing loss   . Unspecified hemorrhoids without mention of complication     Past Surgical History:  Procedure Laterality Date  . ABDOMINAL HYSTERECTOMY     and BSO fibroids  . COLONOSCOPY  05/23/2007  . EYE LID LIFT Right 09/2016   OD  . JOINT REPLACEMENT  1996  . TOTAL HIP ARTHROPLASTY Right      Home Medications:  Prior to Admission medications   Medication Sig Start Date End Date Taking? Authorizing Provider  acetaminophen (TYLENOL) 325 MG tablet Take 650 mg by mouth every 6 (six) hours as needed for mild pain.    Yes [provider]  acetaminophen (TYLENOL) 500 MG tablet Take 500 mg by mouth 2 (two) times daily.   Yes [provider]  aspirin 81 MG tablet Take 81 mg by mouth daily.   Yes [provider]  BYSTOLIC 10 MG tablet Take 10 mg by mouth daily.  09/24/13  Yes [provider]  clindamycin (CLEOCIN) 150 MG capsule Take 600 mg by mouth See admin instructions. ONE HOUR PRIOR TO DENTAL APPOINTMENTS   Yes [provider]  diltiazem (CARDIZEM) 30 MG tablet Take 30 mg by mouth 4 (four) times daily.   Yes [provider]  escitalopram (LEXAPRO) 5 MG tablet Take 5 mg by mouth daily.   Yes [provider]  furosemide (LASIX) 20 MG tablet Take 10 mg by mouth daily as needed for edema.   Yes [provider]  hydrocortisone cream 1 % Apply 1 application topically 2 (two) times daily. TO HEMORRHOIDS   Yes [provider]  levothyroxine (SYNTHROID, LEVOTHROID) 25 MCG tablet Take 25 mcg by mouth daily before  breakfast.    Yes [provider]  Lidocaine (ASPERCREME LIDOCAINE) 4 % PTCH Apply 1 patch topically at bedtime. APPLY TO LOWER BACK AND REMOVE IN THE MORNING   Yes [provider]  loperamide (IMODIUM A-D) 2 MG tablet Take 2 mg by mouth See admin instructions. If 3 loose stools in 24 hours and hold all laxatives and stool softeners with a max dose of 16 mg/24 hours   Yes [provider]  memantine (NAMENDA XR) 28 MG CP24 24 hr capsule Take 28 mg by mouth.   Yes [provider]  Nutritional Supplements (BENECALORIE PO) Take 1.5 oz by mouth 2 (two) times daily.    Yes [provider]  polyethylene glycol (MIRALAX / GLYCOLAX) packet Take 17 g by mouth every other day. For constipation and hold for loose stools   Yes [provider]  polyvinyl alcohol (LIQUIFILM TEARS) 1.4 % ophthalmic solution Place 1 drop  into both eyes 4 (four) times daily.    Yes [provider]  potassium chloride (K-DUR,KLOR-CON) 10 MEQ tablet Take 10 mEq by mouth daily as needed (for edema/if taking edema).    Yes [provider]  warfarin (COUMADIN) 2 MG tablet Take 2 mg by mouth daily.   Yes [provider]    Inpatient Medications: Scheduled Meds: . acetaminophen  1,000 mg Oral Q8H  . diltiazem  30 mg Oral Q8H  . escitalopram  5 mg Oral Daily  . levothyroxine  25 mcg Oral QAC breakfast  . memantine  28 mg Oral Daily  . nebivolol  10 mg Oral Daily  . polyethylene glycol  17 g Oral QODAY   Continuous Infusions: . methocarbamol (ROBAXIN)  IV     PRN Meds: methocarbamol **OR** methocarbamol (ROBAXIN)  IV, morphine injection, polyethylene glycol  Allergies:    Allergies  Allergen Reactions  . Ace Inhibitors Cough  . Mirtazapine Other (See Comments)    Nightmares   . Penicillins Rash    Has patient had a PCN reaction causing immediate rash, facial/tongue/throat swelling, SOB or lightheadedness with hypotension: Yes Has patient had a  PCN reaction causing severe rash involving mucus membranes or skin necrosis: Unknown Has patient had a PCN reaction that required hospitalization: Unknown Has patient had a PCN reaction occurring within the last 10 years: Unknown If all of the above answers are "NO", then may proceed with Cephalosporin use.     Social History:   Social History   Social History  . Marital status: Widowed    Spouse name: N/A  . Number of children: N/A  . Years of education: N/A   Occupational History  . retired Freight forwarder    Social History Main Topics  . Smoking status: Never Smoker  . Smokeless tobacco: Never Used  . Alcohol use No  . Drug use: No  . Sexual activity: No   Other Topics Concern  . Not on file   Social History Narrative   Lives at [facility] 2011, transferred to memory care 11/12/2013   Widowed   Caffeine   Exercise: none   Never smoked   Alcohol none   DNR    Family History:    Family History  Problem Relation Age of Onset  . Heart disease Mother   . Diabetes Mother   . Heart disease Father   . Diabetes Sister   . Diabetes Brother   . Diabetes Daughter      ROS:  Please see the history of present illness.  ROS  All other ROS reviewed and negative per review of records and speaking with daughter  Physical Exam/Data:   Vitals:   10/28/16 0130 10/28/16 0200 10/28/16 0500 10/28/16 0928  BP: 137/82 115/60  (!) 168/88  Pulse: 94  (!) 51 88  Resp: 17 14  16   Temp:  98.8 F (37.1 C)    TempSrc:  Axillary    SpO2: 96% 96%  100%   No intake or output data in the 24 hours ending 10/28/16 1507 There were no vitals filed for this visit. There is no height or weight on file to calculate BMI.  General:  Elderly female, in no acute distress HEENT: normal Lymph: no adenopathy Neck: no JVD Endocrine:  No thryomegaly Vascular: No carotid bruits; pedal pulses 2+ bilaterally  Cardiac:  normal S1, S2; RRR; no murmur  Lungs:  clear to  auscultation bilaterally, no wheezing, rhonchi or rales  Abd: soft, nontender, no hepatomegaly  Ext: no edema Musculoskeletal:  , left leg externally rotated Skin: warm and dry  Neuro:  CNs 2-12 intact, no focal abnormalities noted Psych:  Normal affect   EKG:  The EKG was personally reviewed and demonstrates:  atrial flutter with variable AV conduction, 96 bpm  Telemetry:  Telemetry was personally reviewed and demonstrates:  Atrial fib/flutter 90's-low 100's  Relevant CV Studies:  Echo pending  Laboratory Data:  Chemistry Recent Labs Lab 10/27/16 1715 10/28/16 0329  NA 141 139  K 3.7 3.7  CL 110 111  CO2 22 21*  GLUCOSE 138* 132*  BUN 27* 24*  CREATININE 0.91 0.85  CALCIUM 9.3 9.0  GFRNONAA 50* 55*  GFRAA 58* >60  ANIONGAP 9 7     Recent Labs Lab 10/28/16 0329  ALBUMIN 3.0*   Hematology Recent Labs Lab 10/27/16 1715 10/28/16 0329  WBC 9.6 10.2  RBC 4.13 3.96  HGB 12.4 12.1  HCT 39.1 37.5  MCV 94.7 94.7  MCH 30.0 30.6  MCHC 31.7 32.3  RDW 14.1 14.5  PLT 174 174   Cardiac Enzymes Recent Labs Lab 10/27/16 2314 10/28/16 0329 10/28/16 1027  TROPONINI <0.03 <0.03 0.06*   No results for input(s): TROPIPOC in the last 168 hours.  BNPNo results for input(s): BNP, PROBNP in the last 168 hours.  DDimer No results for input(s): DDIMER in the last 168 hours.  Radiology/Studies:  Dg Chest 1 View  Result Date: 10/27/2016 CLINICAL DATA:  Fall from standing.  LEFT hip pain. EXAM: CHEST 1 VIEW COMPARISON:  Chest radiograph February 20, 2009 FINDINGS: Cardiac silhouette is moderately enlarged. Calcified aortic knob. Similar chronic interstitial changes. Hyperinflation. Calcified granulomas. No pleural effusion or focal consolidation. Apical pleural thickening is similar. No pneumothorax. Osteopenia. Old T8 and T11 compression fractures. IMPRESSION: Stable cardiomegaly and COPD. Aortic Atherosclerosis (ICD10-I70.0). Electronically Signed   By: Elon Alas M.D.    On: 10/27/2016 18:14   Ct Head Wo Contrast  Result Date: 10/27/2016 CLINICAL DATA:  Fall from standing position with left hip fracture EXAM: CT HEAD WITHOUT CONTRAST CT CERVICAL SPINE WITHOUT CONTRAST TECHNIQUE: Multidetector CT imaging of the head and cervical spine was performed following the standard protocol without intravenous contrast. Multiplanar CT image reconstructions of the cervical spine were also generated. COMPARISON:  11/10/2013 FINDINGS: CT HEAD FINDINGS Brain: No acute territorial infarction, hemorrhage, or intracranial mass is seen. Marked atrophy. Mild small vessel ischemic changes of the white matter. Stable small left sub insula hypodensities. Stable enlarged ventricles, felt secondary to atrophy. Vascular: No hyperdense vessels. Carotid artery calcifications. Vertebral artery calcification. Skull: No fracture Sinuses/Orbits: Mild mucosal thickening within the ethmoid sinuses. No acute orbital abnormality. Other: None CT CERVICAL SPINE FINDINGS Alignment: Trace retrolisthesis of C5 on C6, unchanged. Trace anterolisthesis C3 and C4 unchanged. Facet alignment within normal limits. Skull base and vertebrae: No acute fracture. No primary bone lesion or focal pathologic process. Soft tissues and spinal canal: No prevertebral fluid or swelling. No visible canal hematoma. Disc levels: Moderate severe degenerative changes at C4-C5, C5-C6 and C6-C7. Bilateral foraminal stenosis C4 through C7, most notable at C5 C6. Upper chest: Apical scarring.  Carotid artery calcification. Other: None IMPRESSION: 1. No CT evidence for acute intracranial abnormality. Atrophy and small vessel ischemic changes of the white matter 2. Stable cervical spine alignment with degenerative changes. No acute osseous abnormality Electronically Signed   By: Donavan Foil M.D.   On: 10/27/2016 18:54   Ct Cervical Spine Wo Contrast  Result Date: 10/27/2016 CLINICAL DATA:  Fall from standing position with left hip fracture EXAM:  CT HEAD WITHOUT CONTRAST CT CERVICAL SPINE WITHOUT CONTRAST TECHNIQUE: Multidetector CT imaging of the head and cervical spine was performed following the standard protocol without intravenous contrast. Multiplanar CT image reconstructions of the cervical spine were also generated. COMPARISON:  11/10/2013 FINDINGS: CT HEAD FINDINGS Brain: No acute territorial infarction, hemorrhage, or intracranial mass is seen. Marked atrophy. Mild small vessel ischemic changes of the white matter. Stable small left sub insula hypodensities. Stable enlarged ventricles, felt secondary to atrophy. Vascular: No hyperdense vessels. Carotid artery calcifications. Vertebral artery calcification. Skull: No fracture Sinuses/Orbits: Mild mucosal thickening within the ethmoid sinuses. No acute orbital abnormality. Other: None CT CERVICAL SPINE FINDINGS Alignment: Trace retrolisthesis of C5 on C6, unchanged. Trace anterolisthesis C3 and C4 unchanged. Facet alignment within normal limits. Skull base and vertebrae: No acute fracture. No primary bone lesion or focal pathologic process. Soft tissues and spinal canal: No prevertebral fluid or swelling. No visible canal hematoma. Disc levels: Moderate severe degenerative changes at C4-C5, C5-C6 and C6-C7. Bilateral foraminal stenosis C4 through C7, most notable at C5 C6. Upper chest: Apical scarring.  Carotid artery calcification. Other: None IMPRESSION: 1. No CT evidence for acute intracranial abnormality. Atrophy and small vessel ischemic changes of the white matter 2. Stable cervical spine alignment with degenerative changes. No acute osseous abnormality Electronically Signed   By: Donavan Foil M.D.   On: 10/27/2016 18:54   Dg Hip Unilat With Pelvis 2-3 Views Left  Result Date: 10/27/2016 CLINICAL DATA:  Fall EXAM: DG HIP (WITH OR WITHOUT PELVIS) 2-3V LEFT COMPARISON:  None. FINDINGS: Fracture left femoral neck with mild displacement. Normal left hip joint Chronic fracture right pubic rami.  Right hip hemiarthroplasty. Lumbar disc degeneration IMPRESSION: Displaced fracture left femoral neck Electronically Signed   By: Franchot Gallo M.D.   On: 10/27/2016 18:13    Assessment and Plan:   Elevated troponin -Troponins <0.03, <0.03, 0.06 - Pt has dementia and difficult to determine symptoms. She is resting comfortably and denies chest pain. Daughter is not aware of any c/o chest pain or shortness of breath recently.  -No previous MI or cardiac interventions.   -Continue BB perioperatively for better outcomes. Currently stable BP.   Chronic diastolic heart failure -Per SNF records  09/01/16 echocardiogram: mild aortic insufficiency, moderate mitral insufficiency, moderate TR/mold PI with  moderate pulmonary hypertension, normal LV function.   -Treated with furosemide 10 mg prn edema, bystolic 10 mg daily -Echo has been ordered  Atrial fibrillation -Recently noted with uncontrolled rates. 09/21/2016. Diltiazem 60 mg q6h added. Bystolic continued. Amlodipine discontinued.  -Maintaining atrial fib in the 90s to low 100's. -Pt on warfarin for stroke risk reduction. CHA2DS2/VAS Stroke Risk Score is 5 (CHF, HTN, Age (2), female)   Left hip femoral neck fracture -Orthopedics recommends operative fixation of the left hip fracture. INR is 3 and will need to be closer to 1.2 at time of surgery. Recommendation to allow INR to drift down without reversal. They would like to plan for surgery on Sunday.  -Pt is at moderate cardiovascular risk for surgery due to age. She may have an increased risk of cardiovascular mortality related to the mild increase in troponin.   Signed, Daune Perch, NP  10/28/2016 3:07 PM  Personally seen and examined. Agree with above.  83 year old female with dementia, mildly elevated troponin of 0.06 after 2 normal values here with hip fracture, chronic diastolic heart failure, atrial  fibrillation, supratherapeutic INR on Coumadin awaiting potential fixation.  Exam:  Sleeping, occasional changes in respiratory pattern noted. She is lying in bed with a baby doll in her left arms. Gently opens eyes at times. Heart irregularly irregular, no significant murmurs, lungs are clear, no edema  Preoperative cardiac risk assessment  - Based mainly upon age, she is of moderate to high cardiovascular risk but she may proceed with surgery with caution. Without hip fixation, she may never walk again and surely this would be an extremely poor prognosis.  - Prior assessment of ejection fraction was normal.  Elevated troponin/demand ischemia  - No complaints of chest discomfort although she is with baseline dementia. EKG shows no signs of ischemic changes. First 2 troponins were normal and third troponin was 0.06, ever so slightly elevated. This elevation is likely result of increased stress in the setting of hip fracture. This is not acute coronary syndrome. Elevated troponin does however portend worsened prognosis from a cardiovascular perspective. Discussed with family.  Supratherapeutic INR  - Currently 3. Allowing to drift down. Potential surgery Sunday. No bridging.  Atrial fibrillation/flutter, permanent  - Currently well controlled with addition of medium-dose diltiazem.  - EKG personally reviewed shows no ischemic changes.  Please let us know if we can be of further assistance. Family is aware inherent risks of surgery.  Candee Furbish, MD

## 2016-10-28 NOTE — Progress Notes (Signed)
CRITICAL VALUE ALERT  Critical Value:  Troponin 0.06  Date & Time Notied:  10/28/16 1147  Provider Notified: yes  Orders Received/Actions taken: waiting for call back

## 2016-10-28 NOTE — Progress Notes (Signed)
MD order to hold medications until speech evaluation is performed.

## 2016-10-28 NOTE — Progress Notes (Signed)
PROGRESS NOTE    BRIANNIE Nelson  VFI:[id number] DOB: August 21, 1915 DOA: 10/27/2016 PCP: Blanchie Serve, MD (Confirm with patient/family/NH records and if not entered, this HAS to be entered at [facility] point of entry. "No PCP" if truly none.)   Brief Narrative: (Start on day 1 of progress note - keep it brief and live) Patricia Nelson is Patricia Nelson 83 y.o. female with medical history significant of anemia, chronic kidney disease, depression, HTN, HLD, macular degeneration, osteoporosis, atrial fibrillation on warfarin, Right total hip replacement who presented with Patricia Nelson fall on 9/5.  Fall not associated with syncope or chest pain.  At baseline, ambulates with walker and is able to recognize her family.  Imaging was notable for displaced fracture of the L femoral neck.    Assessment & Plan:   Active Problems:   HTN (hypertension)   Hypercholesteremia   CKD (chronic kidney disease) stage 3, GFR 30-59 ml/min   Hypothyroidism   Patricia Nelson (HCC)   Congestive heart failure (CHF) ([place])   Fall   Bradycardia   Closed left femoral fracture (HCC)   Hip fracture ([place])   Displaced Fracture of L femoral neck  Fall:  Fall may have been mechanical, but unclear hx.  No hx of CP, LOC.  Will need to discuss hx of falls further with family. RCRI probably at least 2 for ?hx HF and q's on EKG in V1, V2.  Planning for echo.  Possible cardiology consultation pending results.  She has CXR findings c/f COPD which also increase her risk.  [ ]  echo pending Telemetry notable for afib Holding warfarin, s/p vit K x2.   SCD's IV pain meds until cleared by speech  Vitamin D pending Purewick in place Ortho following, surgery likely Sunday  ? Hx of CHF:  Per fam hx.  Will discuss further, obtain echo.  CKD stage III:  Baseline Cr seems to fluctuate, but appears around 1.2.  Currently lower than baseline at 0.85. ctm  Atrial Fibrillation: holding anticoagulation, rate controolled with bystolic and dilt.  Will need to discuss hx  of falls and further anticoagulation.  Transient episode of bradycardia noted on admission, ctm on tele.  Dementia: delirium prec namenda  HTN: continue bystolic/diltiazem HLD: home meds Hypothyroidism: synthroid Depression/anxiety: lexapro  Holding home aspirin, discuss continuing this with hx of falls as well  DVT prophylaxis: SCD Code Status: DNR Family Communication: Daugher Disposition Plan: pending OR   Consultants:   orthopedics  Procedures: (Don't include imaging studies which can be auto populated. Include things that cannot be auto populated i.e. Echo, Carotid and venous dopplers, Foley, Bipap, HD, tubes/drains, wound vac, central lines etc)  Echo pending  Antimicrobials: (specify start and planned stop date. Auto populated tables are space occupying and do not give end dates)  none    Subjective: Patricia Nelson&Ox1.  Sleepy this morning.  Objective: Vitals:   10/28/16 0100 10/28/16 0130 10/28/16 0200 10/28/16 0500  BP: (!) 141/68 137/82 115/60   Pulse: 88 94  (!) 51  Resp: 15 17 14    Temp:   98.8 F (37.1 C)   TempSrc:   Axillary   SpO2: 95% 96% 96%    No intake or output data in the 24 hours ending 10/28/16 0729 There were no vitals filed for this visit.  Examination:  General exam: Appears calm and comfortable, sleepy  Respiratory system: Clear to auscultation. Respiratory effort normal. Cardiovascular system: S1 & S2 heard, RRR. No JVD, murmurs, rubs, gallops or clicks. No pedal edema. Gastrointestinal  system: Abdomen is nondistended, soft and nontender. No organomegaly or masses felt. Normal bowel sounds heard. Central nervous system: Alert and oriented x1. No focal neurological deficits. Extremities: L leg externally rotated.  Palpable pedal pulses. Skin: No rashes, lesions or ulcers     Data Reviewed: I have personally reviewed following labs and imaging studies  CBC:  Recent Labs Lab 10/27/16 1715 10/28/16 0329  WBC 9.6 10.2  NEUTROABS 8.4*   --   HGB 12.4 12.1  HCT 39.1 37.5  MCV 94.7 94.7  PLT 174 161   Basic Metabolic Panel:  Recent Labs Lab 10/27/16 1715 10/28/16 0329  NA 141 139  K 3.7 3.7  CL 110 111  CO2 22 21*  GLUCOSE 138* 132*  BUN 27* 24*  CREATININE 0.91 0.85  CALCIUM 9.3 9.0   GFR: Estimated Creatinine Clearance: 25.9 mL/min (by C-G formula based on SCr of 0.85 mg/dL). Liver Function Tests:  Recent Labs Lab 10/28/16 0329  ALBUMIN 3.0*   No results for input(s): LIPASE, AMYLASE in the last 168 hours. No results for input(s): AMMONIA in the last 168 hours. Coagulation Profile:  Recent Labs Lab 10/27/16 1715 10/28/16 0329  INR 3.06 3.06   Cardiac Enzymes:  Recent Labs Lab 10/27/16 2314 10/28/16 0329  TROPONINI <0.03 <0.03   BNP (last 3 results) No results for input(s): PROBNP in the last 8760 hours. HbA1C: No results for input(s): HGBA1C in the last 72 hours. CBG: No results for input(s): GLUCAP in the last 168 hours. Lipid Profile: No results for input(s): CHOL, HDL, LDLCALC, TRIG, CHOLHDL, LDLDIRECT in the last 72 hours. Thyroid Function Tests:  Recent Labs  10/27/16 2314  TSH 2.048   Anemia Panel: No results for input(s): VITAMINB12, FOLATE, FERRITIN, TIBC, IRON, RETICCTPCT in the last 72 hours. Sepsis Labs: No results for input(s): PROCALCITON, LATICACIDVEN in the last 168 hours.  No results found for this or any previous visit (from the past 240 hour(s)).       Radiology Studies: Dg Chest 1 View  Result Date: 10/27/2016 CLINICAL DATA:  Fall from standing.  LEFT hip pain. EXAM: CHEST 1 VIEW COMPARISON:  Chest radiograph February 20, 2009 FINDINGS: Cardiac silhouette is moderately enlarged. Calcified aortic knob. Similar chronic interstitial changes. Hyperinflation. Calcified granulomas. No pleural effusion or focal consolidation. Apical pleural thickening is similar. No pneumothorax. Osteopenia. Old T8 and T11 compression fractures. IMPRESSION: Stable cardiomegaly  and COPD. Aortic Atherosclerosis (ICD10-I70.0). Electronically Signed   By: Elon Alas M.D.   On: 10/27/2016 18:14   Ct Head Wo Contrast  Result Date: 10/27/2016 CLINICAL DATA:  Fall from standing position with left hip fracture EXAM: CT HEAD WITHOUT CONTRAST CT CERVICAL SPINE WITHOUT CONTRAST TECHNIQUE: Multidetector CT imaging of the head and cervical spine was performed following the standard protocol without intravenous contrast. Multiplanar CT image reconstructions of the cervical spine were also generated. COMPARISON:  11/10/2013 FINDINGS: CT HEAD FINDINGS Brain: No acute territorial infarction, hemorrhage, or intracranial mass is seen. Marked atrophy. Mild small vessel ischemic changes of the white matter. Stable small left sub insula hypodensities. Stable enlarged ventricles, felt secondary to atrophy. Vascular: No hyperdense vessels. Carotid artery calcifications. Vertebral artery calcification. Skull: No fracture Sinuses/Orbits: Mild mucosal thickening within the ethmoid sinuses. No acute orbital abnormality. Other: None CT CERVICAL SPINE FINDINGS Alignment: Trace retrolisthesis of C5 on C6, unchanged. Trace anterolisthesis C3 and C4 unchanged. Facet alignment within normal limits. Skull base and vertebrae: No acute fracture. No primary bone lesion or focal pathologic process. Soft  tissues and spinal canal: No prevertebral fluid or swelling. No visible canal hematoma. Disc levels: Moderate severe degenerative changes at C4-C5, C5-C6 and C6-C7. Bilateral foraminal stenosis C4 through C7, most notable at C5 C6. Upper chest: Apical scarring.  Carotid artery calcification. Other: None IMPRESSION: 1. No CT evidence for acute intracranial abnormality. Atrophy and small vessel ischemic changes of the white matter 2. Stable cervical spine alignment with degenerative changes. No acute osseous abnormality Electronically Signed   By: Donavan Foil M.D.   On: 10/27/2016 18:54   Ct Cervical Spine Wo  Contrast  Result Date: 10/27/2016 CLINICAL DATA:  Fall from standing position with left hip fracture EXAM: CT HEAD WITHOUT CONTRAST CT CERVICAL SPINE WITHOUT CONTRAST TECHNIQUE: Multidetector CT imaging of the head and cervical spine was performed following the standard protocol without intravenous contrast. Multiplanar CT image reconstructions of the cervical spine were also generated. COMPARISON:  11/10/2013 FINDINGS: CT HEAD FINDINGS Brain: No acute territorial infarction, hemorrhage, or intracranial mass is seen. Marked atrophy. Mild small vessel ischemic changes of the white matter. Stable small left sub insula hypodensities. Stable enlarged ventricles, felt secondary to atrophy. Vascular: No hyperdense vessels. Carotid artery calcifications. Vertebral artery calcification. Skull: No fracture Sinuses/Orbits: Mild mucosal thickening within the ethmoid sinuses. No acute orbital abnormality. Other: None CT CERVICAL SPINE FINDINGS Alignment: Trace retrolisthesis of C5 on C6, unchanged. Trace anterolisthesis C3 and C4 unchanged. Facet alignment within normal limits. Skull base and vertebrae: No acute fracture. No primary bone lesion or focal pathologic process. Soft tissues and spinal canal: No prevertebral fluid or swelling. No visible canal hematoma. Disc levels: Moderate severe degenerative changes at C4-C5, C5-C6 and C6-C7. Bilateral foraminal stenosis C4 through C7, most notable at C5 C6. Upper chest: Apical scarring.  Carotid artery calcification. Other: None IMPRESSION: 1. No CT evidence for acute intracranial abnormality. Atrophy and small vessel ischemic changes of the white matter 2. Stable cervical spine alignment with degenerative changes. No acute osseous abnormality Electronically Signed   By: Donavan Foil M.D.   On: 10/27/2016 18:54   Dg Hip Unilat With Pelvis 2-3 Views Left  Result Date: 10/27/2016 CLINICAL DATA:  Fall EXAM: DG HIP (WITH OR WITHOUT PELVIS) 2-3V LEFT COMPARISON:  None. FINDINGS:  Fracture left femoral neck with mild displacement. Normal left hip joint Chronic fracture right pubic rami. Right hip hemiarthroplasty. Lumbar disc degeneration IMPRESSION: Displaced fracture left femoral neck Electronically Signed   By: Franchot Gallo M.D.   On: 10/27/2016 18:13        Scheduled Meds: . diltiazem  30 mg Oral Q8H  . escitalopram  5 mg Oral Daily  . levothyroxine  25 mcg Oral QAC breakfast  . memantine  28 mg Oral Daily  . nebivolol  10 mg Oral Daily  . polyethylene glycol  17 g Oral QODAY   Continuous Infusions: . sodium chloride 50 mL/hr at 10/27/16 2325  . methocarbamol (ROBAXIN)  IV       LOS: 1 day    Time spent: 35 minutes    Keyairra Kolinski Melven Sartorius, MD [facility] [phone]  If 7PM-7AM, please contact night-coverage [email or web address] Password [email or web address] 10/28/2016, 7:29 AM

## 2016-10-28 NOTE — Progress Notes (Signed)
  Echocardiogram 2D Echocardiogram has been performed.  Patricia Nelson 10/28/2016, 10:42 AM

## 2016-10-29 LAB — BASIC METABOLIC PANEL
ANION GAP: 7 (ref 5–15)
BUN: 30 mg/dL — ABNORMAL HIGH (ref 6–20)
CHLORIDE: 114 mmol/L — AB (ref 101–111)
CO2: 20 mmol/L — ABNORMAL LOW (ref 22–32)
Calcium: 8.5 mg/dL — ABNORMAL LOW (ref 8.9–10.3)
Creatinine, Ser: 0.97 mg/dL (ref 0.44–1.00)
GFR calc non Af Amer: 47 mL/min — ABNORMAL LOW (ref >60)
GFR, EST AFRICAN AMERICAN: 54 mL/min — AB (ref >60)
Glucose, Bld: 165 mg/dL — ABNORMAL HIGH (ref 65–99)
POTASSIUM: 3.8 mmol/L (ref 3.5–5.1)
SODIUM: 141 mmol/L (ref 135–145)

## 2016-10-29 LAB — VITAMIN D 25 HYDROXY (VIT D DEFICIENCY, FRACTURES): VIT D 25 HYDROXY: 20.1 ng/mL — AB (ref 30.0–100.0)

## 2016-10-29 LAB — ECHOCARDIOGRAM COMPLETE
AOASC: 33 cm
Area-P 1/2: 6.29 cm2
CHL CUP MV DEC (S): 120
CHL CUP TV REG PEAK VELOCITY: 401 cm/s
EWDT: 120 ms
FS: 35 % (ref 28–44)
Height: 60 in
IVS/LV PW RATIO, ED: 1.12
LA vol A4C: 48.7 ml
LA vol index: 36 mL/m2
LA vol: 54.3 mL
LADIAMINDEX: 2.59 cm/m2
LASIZE: 39 mm
LEFT ATRIUM END SYS DIAM: 39 mm
LV PW d: 10.9 mm — AB (ref 0.6–1.1)
LVOT area: 2.27 cm2
LVOTD: 17 mm
MV VTI: 170 cm
MV pk E vel: 155 m/s
MVPG: 10 mmHg
MVPKAVEL: 101 m/s
MVSPHT: 35 ms
PISA EROA: 0.04 cm2
PV Reg grad dias: 6 mmHg
PV Reg vel dias: 122 cm/s
RV sys press: 79 mmHg
TAPSE: 12.6 mm
TR max vel: 401 cm/s
Weight: 1875.2 oz

## 2016-10-29 LAB — CBC
HEMATOCRIT: 35.1 % — AB (ref 36.0–46.0)
HEMOGLOBIN: 11.3 g/dL — AB (ref 12.0–15.0)
MCH: 30.8 pg (ref 26.0–34.0)
MCHC: 32.2 g/dL (ref 30.0–36.0)
MCV: 95.6 fL (ref 78.0–100.0)
Platelets: 130 10*3/uL — ABNORMAL LOW (ref 150–400)
RBC: 3.67 MIL/uL — AB (ref 3.87–5.11)
RDW: 14.3 % (ref 11.5–15.5)
WBC: 7.4 10*3/uL (ref 4.0–10.5)

## 2016-10-29 LAB — TROPONIN I
Troponin I: 0.05 ng/mL (ref <0.03)
Troponin I: 0.04 ng/mL (ref <0.03)

## 2016-10-29 LAB — PROTIME-INR
INR: 1.44
PROTHROMBIN TIME: 17.4 s — AB (ref 11.4–15.2)

## 2016-10-29 MED ORDER — OXYCODONE HCL 5 MG PO TABS: 2.5000 mg | ORAL_TABLET | ORAL | Status: DC | PRN

## 2016-10-29 MED ORDER — POLYVINYL ALCOHOL 1.4 % OP SOLN
1.0000 [drp] | Freq: Four times a day (QID) | OPHTHALMIC | Status: DC
  Administered 2016-10-29 – 2016-11-03 (×20): 1 [drp] via OPHTHALMIC
  Filled 2016-10-29: qty 15

## 2016-10-29 MED ORDER — OXYCODONE HCL 5 MG PO TABS: 5.0000 mg | ORAL_TABLET | ORAL | Status: DC | PRN

## 2016-10-29 MED ORDER — POLYETHYLENE GLYCOL 3350 17 G PO PACK
17.0000 g | PACK | Freq: Every day | ORAL | Status: DC
  Administered 2016-10-29 – 2016-11-03 (×5): 17 g via ORAL
  Filled 2016-10-29 (×4): qty 1

## 2016-10-29 MED ORDER — HEPARIN SODIUM (PORCINE) 5000 UNIT/ML IJ SOLN
5000.0000 [IU] | Freq: Three times a day (TID) | INTRAMUSCULAR | Status: AC
  Administered 2016-10-29 – 2016-10-30 (×4): 5000 [IU] via SUBCUTANEOUS
  Filled 2016-10-29 (×4): qty 1

## 2016-10-29 NOTE — Plan of Care (Signed)
Problem: Safety: Goal: Ability to remain free from injury will improve Outcome: Progressing No safety issues noted, safety precautions maintained  Problem: Pain Managment: Goal: General experience of comfort will improve Outcome: Progressing On schedule Tylenol  Problem: Skin Integrity: Goal: Risk for impaired skin integrity will decrease Outcome: Progressing No skin issues noted  Problem: Activity: Goal: Risk for activity intolerance will decrease Outcome: Progressing Tolerates turning and repositioning

## 2016-10-29 NOTE — Progress Notes (Addendum)
PROGRESS NOTE    Patricia Nelson  WJX:[id number] DOB: [birth year] DOA: 10/27/2016 PCP: Blanchie Serve, MD (Confirm with patient/family/NH records and if not entered, this HAS to be entered at [facility] point of entry. "No PCP" if truly none.)   Brief Narrative: (Start on day 1 of progress note - keep it brief and live) Patricia Nelson is Patricia Nelson 83 y.o. female with medical history significant of anemia, chronic kidney disease, depression, HTN, HLD, macular degeneration, osteoporosis, atrial fibrillation on warfarin, Right total hip replacement who presented with Reese Senk fall on 9/5.  Fall not associated with syncope or chest pain.  At baseline, ambulates with walker and is able to recognize her family.  Imaging was notable for displaced fracture of the L femoral neck.    Assessment & Plan:   Active Problems:   HTN (hypertension)   Hypercholesteremia   CKD (chronic kidney disease) stage 3, GFR 30-59 ml/min   Hypothyroidism   Patricia Nelson-fib (HCC)   Congestive heart failure (CHF) ([place])   Fall   Bradycardia   Closed left femoral fracture (HCC)   Hip fracture ([place])   Displaced Fracture of L femoral neck  Fall:  Fall may have been mechanical, but unclear hx.  No hx of CP, LOC.  Will need to discuss hx of falls further with family. RCRI probably at least 2 for ?hx HF and q's on EKG in V1, V2.  Planning for echo.  Possible cardiology consultation pending results.  She has CXR findings c/f COPD which also increase her risk.  [ ]  echo pending [ ]  INR daily Telemetry notable for afib Holding warfarin, s/p vit K x1.   SCD's Scheduled APAP, oxycodone 2.5-5 mg prn (has not had any pain meds since yesterday morning) Speech rec reg diet, thin liquid, staff to assist with self feeding with full supervision/cueing Vitamin D pending Purewick in place Ortho following, surgery likely Sunday  Elevated troponin:  Likely 2/2 stress due to fall.  Cards saw pt who noted this probably means increased cardiovascular risk.   Trended down this morning.  Pyuria: suggestive of infection, but no sx.  Follow up cx, but no treatment for asx bacteruria unless she develops sx.  ? Hx of CHF:  Per fam hx.  Will discuss further, obtain echo.  CKD stage III:  Baseline Cr seems to fluctuate, but appears around 1.2.  Currently lower than baseline at 0.85. ctm  Atrial Fibrillation: holding anticoagulation, rate controolled with bystolic and dilt.  Will need to discuss hx of falls and further anticoagulation.  Transient episode of bradycardia noted on admission, ctm on tele.  Dementia: delirium prec namenda  HTN: continue bystolic/diltiazem HLD: home meds Hypothyroidism: synthroid Depression/anxiety: lexapro  Holding home aspirin, discuss continuing this with hx of falls as well  DVT prophylaxis: SCD Code Status: DNR Family Communication: Daugher Disposition Plan: pending OR   Consultants:   orthopedics  Procedures: (Don't include imaging studies which can be auto populated. Include things that cannot be auto populated i.e. Echo, Carotid and venous dopplers, Foley, Bipap, HD, tubes/drains, wound vac, central lines etc)  Echo pending  Antimicrobials: (specify start and planned stop date. Auto populated tables are space occupying and do not give end dates)  none    Subjective: More awake this morning.  No complaints.   Objective: Vitals:   10/28/16 0928 10/28/16 1524 10/28/16 2004 10/29/16 0600  BP: (!) 168/88 132/82 (!) 128/53 (!) 143/81  Pulse: 88 74 96 83  Resp: 16 16 15  20  Temp:  (!) 97.4 F (36.3 C) 97.7 F (36.5 C) 98.3 F (36.8 C)  TempSrc:  Axillary Axillary Oral  SpO2: 100% 94% 95% 96%    Intake/Output Summary (Last 24 hours) at 10/29/16 1040 Last data filed at 10/29/16 0900  Gross per 24 hour  Intake              280 ml  Output              300 ml  Net              -20 ml   There were no vitals filed for this visit.  Examination:  General: No acute distress.  [name] in  arms. Cardiovascular: Heart sounds show [name] irregular rate, and rhythm. No gallops or rubs. No murmurs. No JVD. Lungs: Clear to auscultation bilaterally with good air movement. No rales, rhonchi or wheezes. Abdomen: Soft, nontender, nondistended with normal active bowel sounds. No masses. No hepatosplenomegaly. Neurological:  Cranial nerves II through XII grossly intact. Skin: Warm and dry. No rashes or lesions. Extremities: No clubbing or cyanosis. L leg externally rotated. No edema. Pedal pulses 2+.  Data Reviewed: I have personally reviewed following labs and imaging studies  CBC:  Recent Labs Lab 10/27/16 1715 10/28/16 0329  WBC 9.6 10.2  NEUTROABS 8.4*  --   HGB 12.4 12.1  HCT 39.1 37.5  MCV 94.7 94.7  PLT 174 532   Basic Metabolic Panel:  Recent Labs Lab 10/27/16 1715 10/28/16 0329  NA 141 139  K 3.7 3.7  CL 110 111  CO2 22 21*  GLUCOSE 138* 132*  BUN 27* 24*  CREATININE 0.91 0.85  CALCIUM 9.3 9.0   GFR: Estimated Creatinine Clearance: 25.9 mL/min (by C-G formula based on SCr of 0.85 mg/dL). Liver Function Tests:  Recent Labs Lab 10/28/16 0329  ALBUMIN 3.0*   No results for input(s): LIPASE, AMYLASE in the last 168 hours. No results for input(s): AMMONIA in the last 168 hours. Coagulation Profile:  Recent Labs Lab 10/27/16 1715 10/28/16 0329  INR 3.06 3.06   Cardiac Enzymes:  Recent Labs Lab 10/28/16 0329 10/28/16 1027 10/28/16 1455 10/28/16 2228 10/29/16 0330  TROPONINI <0.03 0.06* 0.09* 0.05* 0.04*   BNP (last 3 results) No results for input(s): PROBNP in the last 8760 hours. HbA1C: No results for input(s): HGBA1C in the last 72 hours. CBG: No results for input(s): GLUCAP in the last 168 hours. Lipid Profile: No results for input(s): CHOL, HDL, LDLCALC, TRIG, CHOLHDL, LDLDIRECT in the last 72 hours. Thyroid Function Tests:  Recent Labs  10/27/16 2314  TSH 2.048   Anemia Panel: No results for input(s): VITAMINB12, FOLATE,  FERRITIN, TIBC, IRON, RETICCTPCT in the last 72 hours. Sepsis Labs: No results for input(s): PROCALCITON, LATICACIDVEN in the last 168 hours.  No results found for this or any previous visit (from the past 240 hour(s)).       Radiology Studies: Dg Chest 1 View  Result Date: 10/27/2016 CLINICAL DATA:  Fall from standing.  LEFT hip pain. EXAM: CHEST 1 VIEW COMPARISON:  Chest radiograph February 20, 2009 FINDINGS: Cardiac silhouette is moderately enlarged. Calcified aortic knob. Similar chronic interstitial changes. Hyperinflation. Calcified granulomas. No pleural effusion or focal consolidation. Apical pleural thickening is similar. No pneumothorax. Osteopenia. Old T8 and T11 compression fractures. IMPRESSION: Stable cardiomegaly and COPD. Aortic Atherosclerosis (ICD10-I70.0). Electronically Signed   By: Elon Alas M.D.   On: 10/27/2016 18:14   Ct Head Wo Contrast  Result Date:  10/27/2016 CLINICAL DATA:  Fall from standing position with left hip fracture EXAM: CT HEAD WITHOUT CONTRAST CT CERVICAL SPINE WITHOUT CONTRAST TECHNIQUE: Multidetector CT imaging of the head and cervical spine was performed following the standard protocol without intravenous contrast. Multiplanar CT image reconstructions of the cervical spine were also generated. COMPARISON:  11/10/2013 FINDINGS: CT HEAD FINDINGS Brain: No acute territorial infarction, hemorrhage, or intracranial mass is seen. Marked atrophy. Mild small vessel ischemic changes of the white matter. Stable small left sub insula hypodensities. Stable enlarged ventricles, felt secondary to atrophy. Vascular: No hyperdense vessels. Carotid artery calcifications. Vertebral artery calcification. Skull: No fracture Sinuses/Orbits: Mild mucosal thickening within the ethmoid sinuses. No acute orbital abnormality. Other: None CT CERVICAL SPINE FINDINGS Alignment: Trace retrolisthesis of C5 on C6, unchanged. Trace anterolisthesis C3 and C4 unchanged. Facet alignment  within normal limits. Skull base and vertebrae: No acute fracture. No primary bone lesion or focal pathologic process. Soft tissues and spinal canal: No prevertebral fluid or swelling. No visible canal hematoma. Disc levels: Moderate severe degenerative changes at C4-C5, C5-C6 and C6-C7. Bilateral foraminal stenosis C4 through C7, most notable at C5 C6. Upper chest: Apical scarring.  Carotid artery calcification. Other: None IMPRESSION: 1. No CT evidence for acute intracranial abnormality. Atrophy and small vessel ischemic changes of the white matter 2. Stable cervical spine alignment with degenerative changes. No acute osseous abnormality Electronically Signed   By: Donavan Foil M.D.   On: 10/27/2016 18:54   Ct Cervical Spine Wo Contrast  Result Date: 10/27/2016 CLINICAL DATA:  Fall from standing position with left hip fracture EXAM: CT HEAD WITHOUT CONTRAST CT CERVICAL SPINE WITHOUT CONTRAST TECHNIQUE: Multidetector CT imaging of the head and cervical spine was performed following the standard protocol without intravenous contrast. Multiplanar CT image reconstructions of the cervical spine were also generated. COMPARISON:  11/10/2013 FINDINGS: CT HEAD FINDINGS Brain: No acute territorial infarction, hemorrhage, or intracranial mass is seen. Marked atrophy. Mild small vessel ischemic changes of the white matter. Stable small left sub insula hypodensities. Stable enlarged ventricles, felt secondary to atrophy. Vascular: No hyperdense vessels. Carotid artery calcifications. Vertebral artery calcification. Skull: No fracture Sinuses/Orbits: Mild mucosal thickening within the ethmoid sinuses. No acute orbital abnormality. Other: None CT CERVICAL SPINE FINDINGS Alignment: Trace retrolisthesis of C5 on C6, unchanged. Trace anterolisthesis C3 and C4 unchanged. Facet alignment within normal limits. Skull base and vertebrae: No acute fracture. No primary bone lesion or focal pathologic process. Soft tissues and spinal  canal: No prevertebral fluid or swelling. No visible canal hematoma. Disc levels: Moderate severe degenerative changes at C4-C5, C5-C6 and C6-C7. Bilateral foraminal stenosis C4 through C7, most notable at C5 C6. Upper chest: Apical scarring.  Carotid artery calcification. Other: None IMPRESSION: 1. No CT evidence for acute intracranial abnormality. Atrophy and small vessel ischemic changes of the white matter 2. Stable cervical spine alignment with degenerative changes. No acute osseous abnormality Electronically Signed   By: Donavan Foil M.D.   On: 10/27/2016 18:54   Dg Hip Unilat With Pelvis 2-3 Views Left  Result Date: 10/27/2016 CLINICAL DATA:  Fall EXAM: DG HIP (WITH OR WITHOUT PELVIS) 2-3V LEFT COMPARISON:  None. FINDINGS: Fracture left femoral neck with mild displacement. Normal left hip joint Chronic fracture right pubic rami. Right hip hemiarthroplasty. Lumbar disc degeneration IMPRESSION: Displaced fracture left femoral neck Electronically Signed   By: Franchot Gallo M.D.   On: 10/27/2016 18:13        Scheduled Meds: . acetaminophen  1,000 mg Oral  Q8H  . diltiazem  30 mg Oral Q8H  . escitalopram  5 mg Oral Daily  . feeding supplement (ENSURE ENLIVE)  237 mL Oral BID BM  . levothyroxine  25 mcg Oral QAC breakfast  . memantine  28 mg Oral Daily  . nebivolol  10 mg Oral Daily  . polyethylene glycol  17 g Oral Daily  . polyvinyl alcohol  1 drop Both Eyes QID   Continuous Infusions: . methocarbamol (ROBAXIN)  IV       LOS: 2 days    Time spent: 35 minutes    Lynzie Cliburn Melven Sartorius, MD [facility] [phone]  If 7PM-7AM, please contact night-coverage [email or web address] Password [email or web address] 10/29/2016, 10:40 AM

## 2016-10-30 DIAGNOSIS — I272 Pulmonary hypertension, unspecified: Secondary | ICD-10-CM

## 2016-10-30 DIAGNOSIS — S72002A Fracture of unspecified part of neck of left femur, initial encounter for closed fracture: Secondary | ICD-10-CM | POA: Diagnosis not present

## 2016-10-30 DIAGNOSIS — W19XXXA Unspecified fall, initial encounter: Secondary | ICD-10-CM | POA: Diagnosis not present

## 2016-10-30 DIAGNOSIS — W19XXXD Unspecified fall, subsequent encounter: Secondary | ICD-10-CM

## 2016-10-30 LAB — BASIC METABOLIC PANEL
ANION GAP: 6 (ref 5–15)
BUN: 30 mg/dL — ABNORMAL HIGH (ref 6–20)
CALCIUM: 8.7 mg/dL — AB (ref 8.9–10.3)
CO2: 22 mmol/L (ref 22–32)
Chloride: 113 mmol/L — ABNORMAL HIGH (ref 101–111)
Creatinine, Ser: 1.01 mg/dL — ABNORMAL HIGH (ref 0.44–1.00)
GFR, EST AFRICAN AMERICAN: 52 mL/min — AB (ref >60)
GFR, EST NON AFRICAN AMERICAN: 44 mL/min — AB (ref >60)
GLUCOSE: 113 mg/dL — AB (ref 65–99)
Potassium: 3.9 mmol/L (ref 3.5–5.1)
SODIUM: 141 mmol/L (ref 135–145)

## 2016-10-30 LAB — CBC
HCT: 34 % — ABNORMAL LOW (ref 36.0–46.0)
HEMOGLOBIN: 10.7 g/dL — AB (ref 12.0–15.0)
MCH: 30 pg (ref 26.0–34.0)
MCHC: 31.5 g/dL (ref 30.0–36.0)
MCV: 95.2 fL (ref 78.0–100.0)
Platelets: 136 10*3/uL — ABNORMAL LOW (ref 150–400)
RBC: 3.57 MIL/uL — ABNORMAL LOW (ref 3.87–5.11)
RDW: 14.3 % (ref 11.5–15.5)
WBC: 6.6 10*3/uL (ref 4.0–10.5)

## 2016-10-30 LAB — PROTIME-INR
INR: 1.37
PROTHROMBIN TIME: 16.7 s — AB (ref 11.4–15.2)

## 2016-10-30 LAB — SURGICAL PCR SCREEN
MRSA, PCR: POSITIVE — AB
STAPHYLOCOCCUS AUREUS: POSITIVE — AB

## 2016-10-30 MED ORDER — SODIUM CHLORIDE 0.9 % IV SOLN: INTRAVENOUS | Status: AC

## 2016-10-30 MED ORDER — CHLORHEXIDINE GLUCONATE CLOTH 2 % EX PADS
6.0000 | MEDICATED_PAD | Freq: Every day | CUTANEOUS | Status: DC
  Administered 2016-10-31: 6 via TOPICAL

## 2016-10-30 MED ORDER — MUPIROCIN 2 % EX OINT
1.0000 "application " | TOPICAL_OINTMENT | Freq: Two times a day (BID) | CUTANEOUS | Status: DC
  Administered 2016-10-30 – 2016-11-03 (×7): 1 via NASAL
  Filled 2016-10-30 (×3): qty 22

## 2016-10-30 NOTE — Progress Notes (Addendum)
Subjective:     Patient reports pain as mild to moderate.  PMH significant for dementia.  Patient has difficulty answering questions appropriately.  Resting comfortably in bed.  Objective:   VITALS:  Temp:  [97.7 F (36.5 C)-98.4 F (36.9 C)] 98.1 F (36.7 C) (09/08 0522) Pulse Rate:  [48-96] 96 (09/08 0522) Resp:  [18] 18 (09/07 1500) BP: (109-148)/(48-67) 148/67 (09/08 0522) SpO2:  [90 %-99 %] 99 % (09/08 0522)  General: WDWN patient in NAD. Psych:  Appropriate mood and affect. Neuro:  A&O x 1, Moving all extremities, sensation intact to light touch HEENT:  EOMs intact Chest:  Even non-labored respirations Skin:   C/D/I, no rashes or lesions Extremities: warm/dry, mild edema, no erythema or echymosis.  No lymphadenopathy. Pulses: Popliteus 2+ MSK:  ROM: full ankle ROM, MMT: patient is able to perform quad set, (-) Homan's    LABS  Recent Labs  10/27/16 1715 10/28/16 0329 10/29/16 1203 10/30/16 0252  HGB 12.4 12.1 11.3* 10.7*  WBC 9.6 10.2 7.4 6.6  PLT 174 174 130* 136*    Recent Labs  10/29/16 1203 10/30/16 0252  NA 141 141  K 3.8 3.9  CL 114* 113*  CO2 20* 22  BUN 30* 30*  CREATININE 0.97 1.01*  GLUCOSE 165* 113*    Recent Labs  10/29/16 1203 10/30/16 0252  INR 1.44 1.37     Assessment/Plan:     L hip femoral neck fx  Patient seen in rounds for Dr. Stann Mainland Plan to OR tomorrow with Dr. Alvan Dame Bed rest NWB L LE SCDs for DVT prophylaxis Hold blood thinners NPO after midnight.  Mechele Claude, PA-C [facility] Office:  [phone]

## 2016-10-30 NOTE — Plan of Care (Signed)
Problem: Safety: Goal: Ability to remain free from injury will improve Outcome: Progressing Safety precautions maintained  Problem: Pain Managment: Goal: General experience of comfort will improve Outcome: Progressing On Tylenol schedule , denies pain  Problem: Activity: Goal: Risk for activity intolerance will decrease Outcome: Progressing Tolerates turning and repositioning

## 2016-10-30 NOTE — Progress Notes (Signed)
LCSW consulted for discharge planning: SNF placement   Barrier:  Plan to OR tomorrow with Dr. Alvan Dame  (will complete consult once therapies have evaluated patient) Will anticipate need for ST SNF. Patient requires insurance authorization as well.  Will complete consult in next day or two after surgery and medically appropriate.   Lane Hacker, MSW Clinical Social Work: [employer] Coverage for :  [phone]

## 2016-10-30 NOTE — Progress Notes (Signed)
PROGRESS NOTE    Patricia Nelson  EHM:[id number] DOB: 1915/11/04 DOA: 10/27/2016 PCP: Blanchie Serve, MD (Confirm with patient/family/NH records and if not entered, this HAS to be entered at [facility] point of entry. "No PCP" if truly none.)   Brief Narrative: (Start on day 1 of progress note - keep it brief and live) Patricia Nelson is a 83 y.o. female with medical history significant of anemia, chronic kidney disease, depression, HTN, HLD, macular degeneration, osteoporosis, atrial fibrillation on warfarin, Right total hip replacement who presented with a fall on 9/5.  Fall not associated with syncope or chest pain.  At baseline, ambulates with walker and is able to recognize her family.  Imaging was notable for displaced fracture of the L femoral neck.    Assessment & Plan:   Active Problems:   HTN (hypertension)   Hypercholesteremia   CKD (chronic kidney disease) stage 3, GFR 30-59 ml/min   Hypothyroidism   A-fib (HCC)   Congestive heart failure (CHF) ([place])   Fall   Bradycardia   Closed left femoral fracture (HCC)   Hip fracture ([place])   Displaced Fracture of L femoral neck  Fall:  Fall may have been mechanical, but unclear hx.  No hx of CP, LOC.  RCRI probably at least 2 for ?hx HF and q's on EKG in V1, V2.  She had elevated troponin which indicates increased CV risk.    Echo should LVH, increased PASP.   Discussed with cardiology who rec proceed with caution, have discussed risk with family.   [ ]  INR daily Telemetry notable for afib Holding warfarin, s/p vit K x1.   SCD's, heparin added until 2200 tonight Scheduled APAP, oxycodone 2.5-5 mg prn (has not had any pain meds since yesterday morning) Speech rec reg diet, thin liquid, staff to assist with self feeding with full supervision/cueing Vitamin D 20.1 Purewick in place Ortho following, surgery likely Sunday  Elevated troponin:  Likely 2/2 stress due to fall.  Trending down.  Discussed with cards as above.  Asx  [name]: Greater than 100,000 unidentified organism.  Continue to monitor for si/sx of infection, but currently asx.   Pulmonary HTN  ? Hx of CHF:  Echo without systolic or diastolic function, but notable for elevated PASP.  F/u outpatient.   CKD stage III:  Baseline Cr seems to fluctuate, but appears around 1.2.  Currently lower than baseline at 0.85. ctm  Atrial Fibrillation: holding anticoagulation, rate controolled with bystolic and dilt.  Sounds like she does not have a history of frequent falls on discussion with family.  High chadsvasc (4 due to age, HTN, and F sex). Transient episode of bradycardia noted on admission, but resolved.  Will discuss plans for resuming anticoagulation prior to d/c.   Anemia: slowly downtrending, continue to monitor.  Thrombocytopenia: mild continue to monitor  Dementia: delirium prec namenda  HTN: continue bystolic/diltiazem HLD: home meds Hypothyroidism: synthroid Depression/anxiety: lexapro  Holding home aspirin, discuss continuing this with hx of falls as well  DVT prophylaxis: SCD Code Status: DNR Family Communication: Daugher and her husband Disposition Plan: pending OR   Consultants:   orthopedics  Procedures: (Don't include imaging studies which can be auto populated. Include things that cannot be auto populated i.e. Echo, Carotid and venous dopplers, Foley, Bipap, HD, tubes/drains, wound vac, central lines etc)  Echo with normal systolic function, but severely elevated PASP  Antimicrobials: (specify start and planned stop date. Auto populated tables are space occupying and do not give end  dates)  none    Subjective: Doing well.  No complaints.  Pleasantly confused.  Knows she has surgery for tomorrow.   Objective: Vitals:   10/29/16 0600 10/29/16 1500 10/29/16 1959 10/30/16 0522  BP: (!) 143/81 (!) 109/48 138/61 (!) 148/67  Pulse: 83 84 (!) 48 96  Resp: 20 18    Temp: 98.3 F (36.8 C) 98.4 F (36.9 C) 97.7 F (36.5  C) 98.1 F (36.7 C)  TempSrc: Oral Oral Oral Oral  SpO2: 96% 90% 92% 99%    Intake/Output Summary (Last 24 hours) at 10/30/16 1104 Last data filed at 10/30/16 0555  Gross per 24 hour  Intake              240 ml  Output             1000 ml  Net             -760 ml   There were no vitals filed for this visit.  Examination:  General: No acute distress. Cardiovascular: Heart sounds show a regular rate, and rhythm. No gallops or rubs. No murmurs. No JVD. Lungs: Clear to auscultation bilaterally with good air movement. No rales, rhonchi or wheezes. Abdomen: Soft, nontender, nondistended with normal active bowel sounds. No masses. No hepatosplenomegaly. Neurological: Alert, but not oriented.  Knows she has surgery tomorrow. Cranial nerves II through XII grossly intact. Skin: Warm and dry. No rashes or lesions. Extremities: No clubbing or cyanosis. L leg externally rotated.  Data Reviewed: I have personally reviewed following labs and imaging studies  CBC:  Recent Labs Lab 10/27/16 1715 10/28/16 0329 10/29/16 1203 10/30/16 0252  WBC 9.6 10.2 7.4 6.6  NEUTROABS 8.4*  --   --   --   HGB 12.4 12.1 11.3* 10.7*  HCT 39.1 37.5 35.1* 34.0*  MCV 94.7 94.7 95.6 95.2  PLT 174 174 130* 161*   Basic Metabolic Panel:  Recent Labs Lab 10/27/16 1715 10/28/16 0329 10/29/16 1203 10/30/16 0252  NA 141 139 141 141  K 3.7 3.7 3.8 3.9  CL 110 111 114* 113*  CO2 22 21* 20* 22  GLUCOSE 138* 132* 165* 113*  BUN 27* 24* 30* 30*  CREATININE 0.91 0.85 0.97 1.01*  CALCIUM 9.3 9.0 8.5* 8.7*   GFR: Estimated Creatinine Clearance: 21.8 mL/min (A) (by C-G formula based on SCr of 1.01 mg/dL (H)). Liver Function Tests:  Recent Labs Lab 10/28/16 0329  ALBUMIN 3.0*   No results for input(s): LIPASE, AMYLASE in the last 168 hours. No results for input(s): AMMONIA in the last 168 hours. Coagulation Profile:  Recent Labs Lab 10/27/16 1715 10/28/16 0329 10/29/16 1203 10/30/16 0252  INR  3.06 3.06 1.44 1.37   Cardiac Enzymes:  Recent Labs Lab 10/28/16 0329 10/28/16 1027 10/28/16 1455 10/28/16 2228 10/29/16 0330  TROPONINI <0.03 0.06* 0.09* 0.05* 0.04*   BNP (last 3 results) No results for input(s): PROBNP in the last 8760 hours. HbA1C: No results for input(s): HGBA1C in the last 72 hours. CBG: No results for input(s): GLUCAP in the last 168 hours. Lipid Profile: No results for input(s): CHOL, HDL, LDLCALC, TRIG, CHOLHDL, LDLDIRECT in the last 72 hours. Thyroid Function Tests:  Recent Labs  10/27/16 2314  TSH 2.048   Anemia Panel: No results for input(s): VITAMINB12, FOLATE, FERRITIN, TIBC, IRON, RETICCTPCT in the last 72 hours. Sepsis Labs: No results for input(s): PROCALCITON, LATICACIDVEN in the last 168 hours.  Recent Results (from the past 240 hour(s))  Culture, Urine  Status: Abnormal (Preliminary result)   Collection Time: 10/28/16  6:50 AM  Result Value Ref Range Status   Specimen Description URINE, RANDOM  Final   Special Requests NONE  Final   Culture >=100,000 COLONIES/mL UNIDENTIFIED ORGANISM (A)  Final   Report Status PENDING  Incomplete         Radiology Studies: No results found.      Scheduled Meds: . acetaminophen  1,000 mg Oral Q8H  . diltiazem  30 mg Oral Q8H  . escitalopram  5 mg Oral Daily  . feeding supplement (ENSURE ENLIVE)  237 mL Oral BID BM  . heparin subcutaneous  5,000 Units Subcutaneous Q8H  . levothyroxine  25 mcg Oral QAC breakfast  . memantine  28 mg Oral Daily  . nebivolol  10 mg Oral Daily  . polyethylene glycol  17 g Oral Daily  . polyvinyl alcohol  1 drop Both Eyes QID   Continuous Infusions: . methocarbamol (ROBAXIN)  IV       LOS: 3 days    Time spent: 15 minutes    A Melven Sartorius, MD [facility] [phone]  If 7PM-7AM, please contact night-coverage [email or web address] Password [email or web address] 10/30/2016, 11:04 AM

## 2016-10-31 ENCOUNTER — Inpatient Hospital Stay (HOSPITAL_COMMUNITY): Payer: PPO | Admitting: Anesthesiology

## 2016-10-31 ENCOUNTER — Encounter (HOSPITAL_COMMUNITY)
Admission: EM | Disposition: A | Discharge status: Skilled Nursing Facility | Payer: Self-pay | Source: Home / Self Care | Attending: Family Medicine

## 2016-10-31 ENCOUNTER — Inpatient Hospital Stay (HOSPITAL_COMMUNITY): Payer: PPO

## 2016-10-31 ENCOUNTER — Encounter (HOSPITAL_COMMUNITY): Payer: Self-pay | Admitting: Anesthesiology

## 2016-10-31 DIAGNOSIS — S72042A Displaced fracture of base of neck of left femur, initial encounter for closed fracture: Secondary | ICD-10-CM | POA: Diagnosis not present

## 2016-10-31 HISTORY — PX: HIP ARTHROPLASTY: SHX981

## 2016-10-31 LAB — BASIC METABOLIC PANEL
ANION GAP: 7 (ref 5–15)
BUN: 27 mg/dL — ABNORMAL HIGH (ref 6–20)
CALCIUM: 8.2 mg/dL — AB (ref 8.9–10.3)
CO2: 21 mmol/L — ABNORMAL LOW (ref 22–32)
CREATININE: 0.9 mg/dL (ref 0.44–1.00)
Chloride: 112 mmol/L — ABNORMAL HIGH (ref 101–111)
GFR, EST AFRICAN AMERICAN: 59 mL/min — AB (ref >60)
GFR, EST NON AFRICAN AMERICAN: 51 mL/min — AB (ref >60)
Glucose, Bld: 108 mg/dL — ABNORMAL HIGH (ref 65–99)
Potassium: 3.7 mmol/L (ref 3.5–5.1)
Sodium: 140 mmol/L (ref 135–145)

## 2016-10-31 LAB — CBC
HCT: 32.9 % — ABNORMAL LOW (ref 36.0–46.0)
HEMOGLOBIN: 10.4 g/dL — AB (ref 12.0–15.0)
MCH: 30.1 pg (ref 26.0–34.0)
MCHC: 31.6 g/dL (ref 30.0–36.0)
MCV: 95.1 fL (ref 78.0–100.0)
PLATELETS: 158 10*3/uL (ref 150–400)
RBC: 3.46 MIL/uL — ABNORMAL LOW (ref 3.87–5.11)
RDW: 14.5 % (ref 11.5–15.5)
WBC: 5.5 10*3/uL (ref 4.0–10.5)

## 2016-10-31 LAB — PROTIME-INR
INR: 1.31
Prothrombin Time: 16.2 seconds — ABNORMAL HIGH (ref 11.4–15.2)

## 2016-10-31 LAB — URINE CULTURE: Culture: >=100000 — AB

## 2016-10-31 SURGERY — HEMIARTHROPLASTY, HIP, DIRECT ANTERIOR APPROACH, FOR FRACTURE
Anesthesia: General | Laterality: Left

## 2016-10-31 MED ORDER — POVIDONE-IODINE 10 % EX SWAB: 2.0000 "application " | Freq: Once | CUTANEOUS | Status: DC

## 2016-10-31 MED ORDER — ROCURONIUM BROMIDE 10 MG/ML (PF) SYRINGE
PREFILLED_SYRINGE | INTRAVENOUS | Status: AC
  Filled 2016-10-31: qty 5

## 2016-10-31 MED ORDER — NEOSTIGMINE METHYLSULFATE 10 MG/10ML IV SOLN
INTRAVENOUS | Status: DC | PRN
  Administered 2016-10-31: 2 mg via INTRAVENOUS

## 2016-10-31 MED ORDER — ACETAMINOPHEN 325 MG PO TABS: 650.0000 mg | ORAL_TABLET | Freq: Four times a day (QID) | ORAL | Status: DC | PRN

## 2016-10-31 MED ORDER — PROPOFOL 10 MG/ML IV BOLUS
INTRAVENOUS | Status: AC
  Filled 2016-10-31: qty 20

## 2016-10-31 MED ORDER — SODIUM CHLORIDE 0.9 % IV SOLN
INTRAVENOUS | Status: DC
  Administered 2016-10-31 (×2): via INTRAVENOUS

## 2016-10-31 MED ORDER — SODIUM CHLORIDE 0.9 % IV SOLN
INTRAVENOUS | Status: DC
  Administered 2016-10-31: 14:00:00 via INTRAVENOUS

## 2016-10-31 MED ORDER — ROCURONIUM BROMIDE 100 MG/10ML IV SOLN
INTRAVENOUS | Status: DC | PRN
  Administered 2016-10-31: 30 mg via INTRAVENOUS

## 2016-10-31 MED ORDER — SODIUM CHLORIDE 0.9 % IR SOLN
Status: DC | PRN
  Administered 2016-10-31: 1

## 2016-10-31 MED ORDER — WARFARIN SODIUM 1 MG PO TABS
3.5000 mg | ORAL_TABLET | Freq: Once | ORAL | Status: AC
  Administered 2016-10-31: 19:00:00 3.5 mg via ORAL
  Filled 2016-10-31: qty 1

## 2016-10-31 MED ORDER — TRAMADOL HCL 50 MG PO TABS
50.0000 mg | ORAL_TABLET | Freq: Four times a day (QID) | ORAL | Status: DC | PRN
  Filled 2016-10-31: qty 1

## 2016-10-31 MED ORDER — GLYCOPYRROLATE 0.2 MG/ML IJ SOLN
INTRAMUSCULAR | Status: DC | PRN
  Administered 2016-10-31: 0.1 mg via INTRAVENOUS

## 2016-10-31 MED ORDER — METOCLOPRAMIDE HCL 5 MG/ML IJ SOLN: 5.0000 mg | Freq: Three times a day (TID) | INTRAMUSCULAR | Status: DC | PRN

## 2016-10-31 MED ORDER — FENTANYL CITRATE (PF) 100 MCG/2ML IJ SOLN
INTRAMUSCULAR | Status: DC | PRN
  Administered 2016-10-31: 25 ug via INTRAVENOUS
  Administered 2016-10-31 (×2): 50 ug via INTRAVENOUS

## 2016-10-31 MED ORDER — MENTHOL 3 MG MT LOZG: 1.0000 | LOZENGE | OROMUCOSAL | Status: DC | PRN

## 2016-10-31 MED ORDER — WARFARIN - PHARMACIST DOSING INPATIENT: Freq: Every day | Status: DC

## 2016-10-31 MED ORDER — PHENOL 1.4 % MT LIQD: 1.0000 | OROMUCOSAL | Status: DC | PRN

## 2016-10-31 MED ORDER — LACTATED RINGERS IV SOLN
INTRAVENOUS | Status: DC | PRN
  Administered 2016-10-31: 10:00:00 via INTRAVENOUS

## 2016-10-31 MED ORDER — ALUM & MAG HYDROXIDE-SIMETH 200-200-20 MG/5ML PO SUSP: 30.0000 mL | ORAL | Status: DC | PRN

## 2016-10-31 MED ORDER — CHLORHEXIDINE GLUCONATE 4 % EX LIQD: 60.0000 mL | Freq: Once | CUTANEOUS | Status: DC

## 2016-10-31 MED ORDER — CEFAZOLIN SODIUM-DEXTROSE 2-4 GM/100ML-% IV SOLN
INTRAVENOUS | Status: AC
  Filled 2016-10-31: qty 100

## 2016-10-31 MED ORDER — FENTANYL CITRATE (PF) 100 MCG/2ML IJ SOLN: 25.0000 ug | INTRAMUSCULAR | Status: DC | PRN

## 2016-10-31 MED ORDER — PHENYLEPHRINE HCL 10 MG/ML IJ SOLN
INTRAMUSCULAR | Status: DC | PRN
  Administered 2016-10-31: 100 ug via INTRAVENOUS

## 2016-10-31 MED ORDER — PROPOFOL 10 MG/ML IV BOLUS
INTRAVENOUS | Status: DC | PRN
  Administered 2016-10-31: 60 mg via INTRAVENOUS

## 2016-10-31 MED ORDER — TRAMADOL HCL 50 MG PO TABS: 50.0000 mg | ORAL_TABLET | Freq: Four times a day (QID) | ORAL | 0 refills | Status: DC | PRN

## 2016-10-31 MED ORDER — CEFAZOLIN SODIUM-DEXTROSE 2-3 GM-% IV SOLR
INTRAVENOUS | Status: DC | PRN
  Administered 2016-10-31: 2 g via INTRAVENOUS

## 2016-10-31 MED ORDER — ONDANSETRON HCL 4 MG/2ML IJ SOLN
INTRAMUSCULAR | Status: DC | PRN
  Administered 2016-10-31: 4 mg via INTRAVENOUS

## 2016-10-31 MED ORDER — CEFAZOLIN SODIUM-DEXTROSE 2-4 GM/100ML-% IV SOLN
2.0000 g | INTRAVENOUS | Status: AC
  Filled 2016-10-31: qty 100

## 2016-10-31 MED ORDER — NEOSTIGMINE METHYLSULFATE 5 MG/5ML IV SOSY
PREFILLED_SYRINGE | INTRAVENOUS | Status: AC
  Filled 2016-10-31: qty 5

## 2016-10-31 MED ORDER — METOCLOPRAMIDE HCL 5 MG PO TABS: 5.0000 mg | ORAL_TABLET | Freq: Three times a day (TID) | ORAL | Status: DC | PRN

## 2016-10-31 MED ORDER — LIDOCAINE HCL (CARDIAC) 20 MG/ML IV SOLN
INTRAVENOUS | Status: DC | PRN
  Administered 2016-10-31: 30 mg via INTRAVENOUS

## 2016-10-31 MED ORDER — ONDANSETRON HCL 4 MG PO TABS: 4.0000 mg | ORAL_TABLET | Freq: Four times a day (QID) | ORAL | Status: DC | PRN

## 2016-10-31 MED ORDER — CEFAZOLIN SODIUM-DEXTROSE 1-4 GM/50ML-% IV SOLN
1.0000 g | Freq: Four times a day (QID) | INTRAVENOUS | Status: AC
  Administered 2016-10-31 (×2): 1 g via INTRAVENOUS
  Filled 2016-10-31 (×2): qty 50

## 2016-10-31 MED ORDER — FENTANYL CITRATE (PF) 250 MCG/5ML IJ SOLN
INTRAMUSCULAR | Status: AC
  Filled 2016-10-31: qty 5

## 2016-10-31 MED ORDER — ONDANSETRON HCL 4 MG/2ML IJ SOLN: 4.0000 mg | Freq: Four times a day (QID) | INTRAMUSCULAR | Status: DC | PRN

## 2016-10-31 MED ORDER — ACETAMINOPHEN 650 MG RE SUPP: 650.0000 mg | Freq: Four times a day (QID) | RECTAL | Status: DC | PRN

## 2016-10-31 MED ORDER — LIDOCAINE 2% (20 MG/ML) 5 ML SYRINGE
INTRAMUSCULAR | Status: AC
  Filled 2016-10-31: qty 5

## 2016-10-31 MED ORDER — VITAMIN D 1000 UNITS PO TABS
1000.0000 [IU] | ORAL_TABLET | Freq: Every day | ORAL | Status: DC
  Administered 2016-11-01: 1000 [IU] via ORAL
  Filled 2016-10-31: qty 1

## 2016-10-31 MED ORDER — PHENYLEPHRINE 40 MCG/ML (10ML) SYRINGE FOR IV PUSH (FOR BLOOD PRESSURE SUPPORT)
PREFILLED_SYRINGE | INTRAVENOUS | Status: AC
  Filled 2016-10-31: qty 10

## 2016-10-31 SURGICAL SUPPLY — 51 items
ADH SKN CLS APL DERMABOND .7 (GAUZE/BANDAGES/DRESSINGS) ×1
BLADE SAW SGTL 18X1.27X75 (BLADE) ×2 IMPLANT
BLADE SAW SGTL 18X1.27X75MM (BLADE) ×1
CAPT HIP HEMI 2 ×2 IMPLANT
COVER SURGICAL LIGHT HANDLE (MISCELLANEOUS) ×3 IMPLANT
DERMABOND ADVANCED (GAUZE/BANDAGES/DRESSINGS) ×2
DERMABOND ADVANCED .7 DNX12 (GAUZE/BANDAGES/DRESSINGS) ×1 IMPLANT
DRAPE IMP U-DRAPE 54X76 (DRAPES) ×3 IMPLANT
DRAPE INCISE IOBAN 85X60 (DRAPES) ×3 IMPLANT
DRAPE ORTHO SPLIT 77X108 STRL (DRAPES) ×6
DRAPE SURG ORHT 6 SPLT 77X108 (DRAPES) ×2 IMPLANT
DRAPE U-SHAPE 47X51 STRL (DRAPES) ×3 IMPLANT
DRSG AQUACEL AG ADV 3.5X10 (GAUZE/BANDAGES/DRESSINGS) ×3 IMPLANT
DURAPREP 26ML APPLICATOR (WOUND CARE) ×3 IMPLANT
ELECT BLADE 4.0 EZ CLEAN MEGAD (MISCELLANEOUS) ×3
ELECT REM PT RETURN 9FT ADLT (ELECTROSURGICAL) ×3
ELECTRODE BLDE 4.0 EZ CLN MEGD (MISCELLANEOUS) ×1 IMPLANT
ELECTRODE REM PT RTRN 9FT ADLT (ELECTROSURGICAL) ×1 IMPLANT
EVACUATOR 1/8 PVC DRAIN (DRAIN) IMPLANT
FACESHIELD WRAPAROUND (MASK) ×6 IMPLANT
FACESHIELD WRAPAROUND OR TEAM (MASK) ×2 IMPLANT
GLOVE BIOGEL PI IND STRL 7.5 (GLOVE) ×1 IMPLANT
GLOVE BIOGEL PI IND STRL 8.5 (GLOVE) ×2 IMPLANT
GLOVE BIOGEL PI INDICATOR 7.5 (GLOVE) ×2
GLOVE BIOGEL PI INDICATOR 8.5 (GLOVE) ×4
GLOVE ECLIPSE 8.0 STRL XLNG CF (GLOVE) ×3 IMPLANT
GLOVE ORTHO TXT STRL SZ7.5 (GLOVE) ×3 IMPLANT
GOWN STRL REUS W/ TWL LRG LVL3 (GOWN DISPOSABLE) ×3 IMPLANT
GOWN STRL REUS W/TWL 2XL LVL3 (GOWN DISPOSABLE) ×3 IMPLANT
GOWN STRL REUS W/TWL LRG LVL3 (GOWN DISPOSABLE) ×9
HANDPIECE INTERPULSE COAX TIP (DISPOSABLE)
IMMOBILIZER KNEE 22 UNIV (SOFTGOODS) ×3 IMPLANT
KIT BASIN OR (CUSTOM PROCEDURE TRAY) ×3 IMPLANT
KIT ROOM TURNOVER OR (KITS) ×3 IMPLANT
MANIFOLD NEPTUNE II (INSTRUMENTS) ×3 IMPLANT
NS IRRIG 1000ML POUR BTL (IV SOLUTION) ×3 IMPLANT
PACK TOTAL JOINT (CUSTOM PROCEDURE TRAY) ×3 IMPLANT
PACK UNIVERSAL I (CUSTOM PROCEDURE TRAY) ×3 IMPLANT
PAD ARMBOARD 7.5X6 YLW CONV (MISCELLANEOUS) ×6 IMPLANT
SET HNDPC FAN SPRY TIP SCT (DISPOSABLE) IMPLANT
SPONGE LAP 4X18 X RAY DECT (DISPOSABLE) ×6 IMPLANT
SUT MNCRL AB 4-0 PS2 18 (SUTURE) IMPLANT
SUT VIC AB 1 CT1 27 (SUTURE) ×6
SUT VIC AB 1 CT1 27XBRD ANBCTR (SUTURE) ×2 IMPLANT
SUT VIC AB 2-0 CT1 27 (SUTURE)
SUT VIC AB 2-0 CT1 TAPERPNT 27 (SUTURE) IMPLANT
SUT VLOC 180 0 24IN GS25 (SUTURE) ×2 IMPLANT
TOWEL OR 17X24 6PK STRL BLUE (TOWEL DISPOSABLE) ×3 IMPLANT
TOWEL OR 17X26 10 PK STRL BLUE (TOWEL DISPOSABLE) ×3 IMPLANT
TRAY FOLEY CATH SILVER 14FR (SET/KITS/TRAYS/PACK) IMPLANT
WATER STERILE IRR 1000ML POUR (IV SOLUTION) ×12 IMPLANT

## 2016-10-31 NOTE — Op Note (Signed)
NAME:  Eyana Stolze                ACCOUNT NO.:  [id number]   MEDICAL RECORD NO.: [id number]   LOCATION:  1245                         FACILITY:  [facility]   DATE OF BIRTH:  2015-04-04  PHYSICIAN:  Pietro Cassis. Alvan Dame, M.D.     DATE OF PROCEDURE:  10/31/16                               OPERATIVE REPORT     PREOPERATIVE DIAGNOSIS:  Left displaced femoral neck fracture.   POSTOPERATIVE DIAGNOSIS:  Left displaced femoral neck fracture.   PROCEDURE:  Left hip hemiarthroplasty utilizing DePuy component, size 4 standard Tri-Lock stem with a 44 mm unipolar ball with a +0 adapter.   SURGEON:  Pietro Cassis. Alvan Dame, MD   ASSISTANT:  Danae Orleans, PA-C.   ANESTHESIA:  General.   SPECIMENS:  None.   DRAINS:  None.   BLOOD LOSS:  About <50 cc.   COMPLICATIONS:  None.   INDICATION OF PROCEDURE:  Ms Gomes is a pleasant 83 year old female who lives who resides in the memory care unit of [facility].  She unfortunately had a fall there.  She was admitted to the hospital after radiographs revealed a femoral neck fracture.  She is on Coumadin for stroke and had elevated INR on admission and this was allowed to drift to normal prior to scheduling surgery.  She was seen and evaluated and was scheduled for surgery for fixation.  The necessity of surgical repair was discussed with her family.  Consent was obtained after reviewing risks of infection, DVT, component failure, and need for revision surgery.   PROCEDURE IN DETAIL:  The patient was brought to the operative theater. Once adequate anesthesia, preoperative antibiotics, 2 g of Ancef administered, the patient was positioned into the right lateral decubitus position with the left side up.  The left lower extremity was then prepped and draped in sterile fashion.  A time-out was performed identifying the patient, planned procedure, and extremity.   A lateral incision was made off the proximal trochanter. Sharp dissection was carried down  to the iliotibial band and gluteal fascia. The gluteal fascia was then incised for posterior approach.  The short external rotators were taken down separate from the posterior capsule. An L capsulotomy was made preserving the posterior leaflet for later anatomic repair. Fracture site was identified and after removing comminuted segments of the posterior femoral neck, the femoral head was removed without difficulty and measured on the back table  using the sizing rings and determined to be 44 mm in diameter.   The proximal femur was then exposed.  Retractors placed.  I then drilled, opened the proximal femur.  Then I hand reamed once and  Irrigated the canal to try to prevent fat emboli.  I began broaching the femur with a starting broach up to a size 4 broach with good medial and lateral metaphyseal fit without evidence of any torsion or movement.  A trial reduction was carried out with a standard neck and a +0 adapter with a 44 mm ball.  The hip reduced nicely.  The leg lengths appeared to be equal compared to the down leg.   The hip went through a range of motion without evidence of  any subluxation or impingement.   Given these findings, the trial components removed.  The final 4 standard  Tri-Lock stem was opened.  After irrigating the canal, the final stem was impacted and sat at the level where the broach was. Based on this and the trial reduction, a +0 adapter was opened and impacted in the 44 mm unipolar ball onto a clean and dry trunnion.  The hip had been irrigated throughout the case and again at this point.  I re- Approximated the posterior capsule to the superior leaflet using a  #1 Vicryl.  The remainder of the wound was closed with #1 Vicryl and #0 V-lock sutures sutures in the iliotibial band and gluteal fascia, a  2-0 Vicryl in the sub-Q tissue and a running 4-0 Monocryl in the skin.  The hip was cleaned, dried, and dressed sterilely using Dermabond and Aquacel dressing.   She was then brought to recovery room, extubated in stable condition, tolerating the procedure well.  Danae Orleans, PA-C was present and utilized as Environmental consultant for the entire case from  Preoperative positioning to management of the contralateral extremity and retractors to  General facilitation of the procedure.  He was also involved with primary wound closure.         Pietro Cassis Alvan Dame, M.D.

## 2016-10-31 NOTE — Progress Notes (Signed)
Patient ID: Patricia Nelson, female   DOB: [birth year], 83 y.o.   MRN: [id number]  Sleeping this am AFVSS  INR normalized  Left displaced femoral neck fracture  NPO Consent To OR today for left hip hemiarthroplasty Daughter to arrive later this am to sign consent - will review with her at that time post-op course and expectations

## 2016-10-31 NOTE — Transfer of Care (Signed)
Immediate Anesthesia Transfer of Care Note  Patient: Patricia Nelson  Procedure(s) Performed: Procedure(s): ARTHROPLASTY  HIP (HEMIARTHROPLASTY) (Left)  Patient Location: PACU  Anesthesia Type:General  Level of Consciousness: awake  Airway & Oxygen Therapy: Patient Spontanous Breathing and Patient connected to nasal cannula oxygen  Post-op Assessment: Report given to RN and Post -op Vital signs reviewed and stable  Post vital signs: Reviewed and stable  Last Vitals:  Vitals:   10/31/16 0300 10/31/16 0900  BP: (!) 142/100 136/79  Pulse: (!) 102 83  Resp: 16   Temp: (!) 36.3 C 36.6 C  SpO2: 99% 93%    Last Pain:  Vitals:   10/31/16 0900  TempSrc: Axillary  PainSc:          Complications: No apparent anesthesia complications

## 2016-10-31 NOTE — Anesthesia Postprocedure Evaluation (Signed)
Anesthesia Post Note  Patient: Patricia Nelson  Procedure(s) Performed: Procedure(s) (LRB): ARTHROPLASTY  HIP (HEMIARTHROPLASTY) (Left)     Patient location during evaluation: PACU Anesthesia Type: General Level of consciousness: awake and alert Pain management: pain level controlled Vital Signs Assessment: post-procedure vital signs reviewed and stable Respiratory status: spontaneous breathing, nonlabored ventilation, respiratory function stable and patient connected to nasal cannula oxygen Cardiovascular status: blood pressure returned to baseline and stable Postop Assessment: no signs of nausea or vomiting Anesthetic complications: no    Last Vitals:  Vitals:   10/31/16 1242 10/31/16 1245  BP: (!) 145/64   Pulse: (!) 45 89  Resp: 20 20  Temp:    SpO2: 92% 92%    Last Pain:  Vitals:   10/31/16 0900  TempSrc: Axillary  PainSc:                  Uchechukwu Dhawan,W. EDMOND

## 2016-10-31 NOTE — Progress Notes (Signed)
ANTICOAGULATION CONSULT NOTE - Initial Consult  Pharmacy Consult:  Coumadin Indication: atrial fibrillation  Allergies  Allergen Reactions  . Ace Inhibitors Cough  . Mirtazapine Other (See Comments)    Nightmares   . Penicillins Rash    Has patient had a PCN reaction causing immediate rash, facial/tongue/throat swelling, SOB or lightheadedness with hypotension: Yes Has patient had a PCN reaction causing severe rash involving mucus membranes or skin necrosis: Unknown Has patient had a PCN reaction that required hospitalization: Unknown Has patient had a PCN reaction occurring within the last 10 years: Unknown If all of the above answers are "NO", then may proceed with Cephalosporin use.     Patient Measurements: Height = 60 inches Weight = 53.2 kg  Vital Signs: Temp: 97.5 F (36.4 C) (09/09 1330) Temp Source: Axillary (09/09 1330) BP: 135/58 (09/09 1330) Pulse Rate: 91 (09/09 1330)  Labs:  Recent Labs  10/28/16 1455 10/28/16 2228 10/29/16 0330  10/29/16 1203 10/30/16 0252 10/31/16 0236  HGB  --   --   --   < > 11.3* 10.7* 10.4*  HCT  --   --   --   --  35.1* 34.0* 32.9*  PLT  --   --   --   --  130* 136* 158  LABPROT  --   --   --   --  17.4* 16.7* 16.2*  INR  --   --   --   --  1.44 1.37 1.31  CREATININE  --   --   --   --  0.97 1.01* 0.90  TROPONINI 0.09* 0.05* 0.04*  --   --   --   --   < > = values in this interval not displayed.  Estimated Creatinine Clearance: 24.5 mL/min (by C-G formula based on SCr of 0.9 mg/dL).   Medical History: Past Medical History:  Diagnosis Date  . Anemia, unspecified   . Anorexia   . Anxiety   . Arthritis   . Atherosclerotic cerebrovascular disease   . Backache, unspecified   . Cataract   . Cholelithiasis   . Chronic kidney disease, unspecified   . Closed fracture of right inferior pubic ramus ([place]) 11/12/2013   11/10/13 s/p fall, ED eval  X-ray R hip  1. Possible nondisplaced right inferior pubic ramus fracture.  2. No  femur fracture or dislocation   02/06/14 dc prn Motrin and Norco-not used    . Depression   . Diaphragmatic hernia without mention of obstruction or gangrene   . Diseases of lips 03/26/2013  . Diverticulosis of colon (without mention of hemorrhage)   . Hemorrhage of rectum and anus 03/26/2013  . Hyperlipidemia   . Hypertension   . Insomnia, unspecified   . Laceration of occipital scalp 11/12/2013  . Macular degeneration 06/28/2013  . Macular degeneration (senile) of retina, unspecified   . Osteoporosis   . Other malaise and fatigue   . Other sleep disturbances   . Other specified cardiac dysrhythmias(427.89)   . Other specified personal history presenting hazards to health(V15.89)   . Other symptoms involving cardiovascular system   . Pancreatitis   . Personal history of other diseases of circulatory system   . Personal history of other diseases of digestive system    pancreatitis from gallstones  . Personal history of other diseases of respiratory system   . Personal history of traumatic fracture   . Rosacea   . Senile osteoporosis    with old T11 fracture  . Thyroid  disease   . Unspecified disorders of arteries and arterioles   . Unspecified hearing loss   . Unspecified hemorrhoids without mention of complication       Assessment: 71 YOF with history of Afib on Coumadin PTA.  Patient presented s/p fall and Coumadin was reversed for surgery.  Now s/p left hemiarthroplasty and Pharmacy consulted to resume Coumadin.  INR is sub-therapeutic as expected; no bleeding reported.  Home Coumadin dose: 2mg  PO daily   Goal of Therapy:  INR 2-3 Monitor platelets by anticoagulation protocol: Yes    Plan:  Coumadin 3.5mg  PO today Daily PT / INR Consider Vitamin D supplementation   Dyasia Firestine D. Mina Marble, PharmD, BCPS Pager:  [phone] 10/31/2016, 2:28 PM

## 2016-10-31 NOTE — Anesthesia Preprocedure Evaluation (Addendum)
Anesthesia Evaluation  Patient identified by MRN, date of birth, ID band Patient awake    Reviewed: Allergy & Precautions, H&P , NPO status , Patient's Chart, lab work & pertinent test results, reviewed documented beta blocker date and time   Airway Mallampati: II  TM Distance: >3 FB Neck ROM: Full    Dental no notable dental hx. (+) Teeth Intact, Dental Advisory Given   Pulmonary neg pulmonary ROS,    Pulmonary exam normal breath sounds clear to auscultation       Cardiovascular hypertension, Pt. on medications and Pt. on home beta blockers +CHF   Rhythm:Regular Rate:Normal     Neuro/Psych Anxiety Depression negative neurological ROS  negative psych ROS   GI/Hepatic Neg liver ROS, hiatal hernia,   Endo/Other  Hypothyroidism   Renal/GU negative Renal ROS  negative genitourinary   Musculoskeletal  (+) Arthritis , Osteoarthritis,    Abdominal   Peds  Hematology negative hematology ROS (+) anemia ,   Anesthesia Other Findings   Reproductive/Obstetrics negative OB ROS                            Anesthesia Physical Anesthesia Plan  ASA: III  Anesthesia Plan: General   Post-op Pain Management:    Induction: Intravenous  PONV Risk Score and Plan: 4 or greater and Ondansetron, Dexamethasone and Treatment may vary due to age or medical condition  Airway Management Planned: Oral ETT  Additional Equipment:   Intra-op Plan:   Post-operative Plan: Extubation in OR  Informed Consent: I have reviewed the patients History and Physical, chart, labs and discussed the procedure including the risks, benefits and alternatives for the proposed anesthesia with the patient or authorized representative who has indicated his/her understanding and acceptance.   Dental advisory given  Plan Discussed with: CRNA  Anesthesia Plan Comments:        Anesthesia Quick Evaluation

## 2016-10-31 NOTE — Anesthesia Procedure Notes (Signed)
Procedure Name: Intubation Date/Time: 10/31/2016 10:45 AM Performed by: Eligha Bridegroom Pre-anesthesia Checklist: Patient identified, Emergency Drugs available, Suction available, Patient being monitored and Timeout performed Patient Re-evaluated:Patient Re-evaluated prior to induction Oxygen Delivery Method: Circle system utilized Preoxygenation: Pre-oxygenation with 100% oxygen Induction Type: IV induction Ventilation: Mask ventilation without difficulty and Oral airway inserted - appropriate to patient size Laryngoscope Size: Mac and 3 Grade View: Grade II Tube type: Oral Number of attempts: 1 Airway Equipment and Method: Stylet Placement Confirmation: ETT inserted through vocal cords under direct vision,  positive ETCO2 and breath sounds checked- equal and bilateral Secured at: 21 cm Dental Injury: Teeth and Oropharynx as per pre-operative assessment

## 2016-10-31 NOTE — Progress Notes (Signed)
PROGRESS NOTE    Patricia Nelson  TOI:[id number] DOB: Apr 13, 1915 DOA: 10/27/2016 PCP: Blanchie Serve, MD (Confirm with patient/family/NH records and if not entered, this HAS to be entered at [facility] point of entry. "No PCP" if truly none.)   Brief Narrative: (Start on day 1 of progress note - keep it brief and live) Patricia Nelson is Patricia Nelson 83 y.o. female with medical history significant of anemia, chronic kidney disease, depression, HTN, HLD, macular degeneration, osteoporosis, atrial fibrillation on warfarin, Right total hip replacement who presented with Patricia Nelson fall on 9/5.  Fall not associated with syncope or chest pain.  At baseline, ambulates with walker and is able to recognize her family.  Imaging was notable for displaced fracture of the L femoral neck.    Assessment & Plan:   Active Problems:   HTN (hypertension)   Hypercholesteremia   CKD (chronic kidney disease) stage 3, GFR 30-59 ml/min   Hypothyroidism   Bell Carbo-fib (HCC)   Congestive heart failure (CHF) (HCC)   Fall   Bradycardia   Closed left femoral fracture (HCC)   Hip fracture (HCC)   Displaced Fracture of L femoral neck s/p L hip hemiarthroplasty  Fall:  Fall may have been mechanical, but unclear hx.  No hx of CP, LOC.  RCRI probably at least 2 for ?hx HF and q's on EKG in V1, V2.  She had elevated troponin which indicates increased CV risk.    Echo should LVH, increased PASP.   Discussed with cardiology who rec proceed with caution, have discussed risk with family.   [ ]  INR daily Telemetry notable for afib Holding warfarin, s/p vit K x1.   Planning for resumption of warfarin, pharmacy dosing Scheduled APAP, tramadol prn (has not had any pain meds since yesterday morning) Speech rec reg diet, thin liquid, staff to assist with self feeding with full supervision/cueing Vitamin D 20.1, vitamin D at d/c Purewick in place  Elevated troponin:  Likely 2/2 stress due to fall.  Trending down.  Discussed with cards as  above.  Asx [name]  MRSA: Greater than 100,000 unidentified organism.  Continue to monitor for si/sx of infection, but currently asx.  Urine growing MRSA, which is unusual urinary pathogen.  Repeat Ucx pending.  Will obtain blood cx x2 as well.  Discussed with ID who noted f/u repeat ucx as possible contaminant, agreed with bcx.  Pulmonary HTN  ? Hx of CHF:  Echo without systolic or diastolic function, but notable for elevated PASP.  F/u outpatient.   CKD stage III:  Baseline Cr seems to fluctuate, but appears around 1.2.  Currently lower than baseline at 0.85. ctm  Atrial Fibrillation: resume anticoagulation per pharmacy, rate controolled with bystolic and dilt.  Sounds like she does not have Travares Nelles history of frequent falls on discussion with family.  High chadsvasc (4 due to age, HTN, and F sex). Transient episode of bradycardia noted on admission, but resolved.  Will need anticoagulation for VTE ppx after surgery.  Can have further discussion of risks benefits with PCP as outpatient.   Anemia: slowly downtrending, continue to monitor.  Thrombocytopenia: resolved  Dementia: delirium prec namenda  HTN: continue bystolic/diltiazem HLD: home meds Hypothyroidism: synthroid Depression/anxiety: lexapro  Holding home aspirin, discuss continuing this with hx of falls as well  DVT prophylaxis: SCD, warfarin Code Status: DNR Family Communication: Daugher and her husband Disposition Plan: pending OR   Consultants:   orthopedics  Procedures: (Don't include imaging studies which can be auto populated. Include  things that cannot be auto populated i.e. Echo, Carotid and venous dopplers, Foley, Bipap, HD, tubes/drains, wound vac, central lines etc)  Echo with normal systolic function, but severely elevated PASP  Antimicrobials: (specify start and planned stop date. Auto populated tables are space occupying and do not give end dates)  none    Subjective: Sleeping post  op.  Objective: Vitals:   10/31/16 1245 10/31/16 1257 10/31/16 1300 10/31/16 1330  BP:  (!) 146/65  (!) 135/58  Pulse: 89 85  91  Resp: 20 17  16   Temp:   (!) 97.5 F (36.4 C) (!) 97.5 F (36.4 C)  TempSrc:   Axillary Axillary  SpO2: 92% 92%  95%    Intake/Output Summary (Last 24 hours) at 10/31/16 1504 Last data filed at 10/31/16 1130  Gross per 24 hour  Intake              790 ml  Output              540 ml  Net              250 ml   There were no vitals filed for this visit.  Examination:  General: No acute distress. Cardiovascular: RRR. Lungs: unlabored, snoring respirations Neurological: Sleeping Skin: Warm and dry. No rashes or lesions. Extremities: No clubbing or cyanosis. No edema  Data Reviewed: I have personally reviewed following labs and imaging studies  CBC:  Recent Labs Lab 10/27/16 1715 10/28/16 0329 10/29/16 1203 10/30/16 0252 10/31/16 0236  WBC 9.6 10.2 7.4 6.6 5.5  NEUTROABS 8.4*  --   --   --   --   HGB 12.4 12.1 11.3* 10.7* 10.4*  HCT 39.1 37.5 35.1* 34.0* 32.9*  MCV 94.7 94.7 95.6 95.2 95.1  PLT 174 174 130* 136* 161   Basic Metabolic Panel:  Recent Labs Lab 10/27/16 1715 10/28/16 0329 10/29/16 1203 10/30/16 0252 10/31/16 0236  NA 141 139 141 141 140  K 3.7 3.7 3.8 3.9 3.7  CL 110 111 114* 113* 112*  CO2 22 21* 20* 22 21*  GLUCOSE 138* 132* 165* 113* 108*  BUN 27* 24* 30* 30* 27*  CREATININE 0.91 0.85 0.97 1.01* 0.90  CALCIUM 9.3 9.0 8.5* 8.7* 8.2*   GFR: Estimated Creatinine Clearance: 24.5 mL/min (by C-G formula based on SCr of 0.9 mg/dL). Liver Function Tests:  Recent Labs Lab 10/28/16 0329  ALBUMIN 3.0*   No results for input(s): LIPASE, AMYLASE in the last 168 hours. No results for input(s): AMMONIA in the last 168 hours. Coagulation Profile:  Recent Labs Lab 10/27/16 1715 10/28/16 0329 10/29/16 1203 10/30/16 0252 10/31/16 0236  INR 3.06 3.06 1.44 1.37 1.31   Cardiac Enzymes:  Recent Labs Lab  10/28/16 0329 10/28/16 1027 10/28/16 1455 10/28/16 2228 10/29/16 0330  TROPONINI <0.03 0.06* 0.09* 0.05* 0.04*   BNP (last 3 results) No results for input(s): PROBNP in the last 8760 hours. HbA1C: No results for input(s): HGBA1C in the last 72 hours. CBG: No results for input(s): GLUCAP in the last 168 hours. Lipid Profile: No results for input(s): CHOL, HDL, LDLCALC, TRIG, CHOLHDL, LDLDIRECT in the last 72 hours. Thyroid Function Tests: No results for input(s): TSH, T4TOTAL, FREET4, T3FREE, THYROIDAB in the last 72 hours. Anemia Panel: No results for input(s): VITAMINB12, FOLATE, FERRITIN, TIBC, IRON, RETICCTPCT in the last 72 hours. Sepsis Labs: No results for input(s): PROCALCITON, LATICACIDVEN in the last 168 hours.  Recent Results (from the past 240 hour(s))  Culture, Urine  Status: Abnormal   Collection Time: 10/28/16  6:50 AM  Result Value Ref Range Status   Specimen Description URINE, RANDOM  Final   Special Requests NONE  Final   Culture (Keyana Guevara)  Final    >=100,000 COLONIES/mL METHICILLIN RESISTANT STAPHYLOCOCCUS AUREUS   Report Status 10/31/2016 FINAL  Final   Organism ID, Bacteria METHICILLIN RESISTANT STAPHYLOCOCCUS AUREUS (Trejan Buda)  Final      Susceptibility   Methicillin resistant staphylococcus aureus - MIC*    CIPROFLOXACIN >=8 RESISTANT Resistant     GENTAMICIN <=0.5 SENSITIVE Sensitive     NITROFURANTOIN <=16 SENSITIVE Sensitive     OXACILLIN >=4 RESISTANT Resistant     TETRACYCLINE <=1 SENSITIVE Sensitive     VANCOMYCIN <=0.5 SENSITIVE Sensitive     TRIMETH/SULFA <=10 SENSITIVE Sensitive     CLINDAMYCIN RESISTANT Resistant     RIFAMPIN <=0.5 SENSITIVE Sensitive     Inducible Clindamycin POSITIVE Resistant     * >=100,000 COLONIES/mL METHICILLIN RESISTANT STAPHYLOCOCCUS AUREUS  Surgical PCR screen     Status: Abnormal   Collection Time: 10/30/16  4:30 PM  Result Value Ref Range Status   MRSA, PCR POSITIVE (Aviela Blundell) NEGATIVE Final    Comment: RESULT CALLED TO,  READ BACK BY AND VERIFIED WITH: Roye Gustafson. PRIDDY,RN 1910 10/30/2016 T. TYSOR    Staphylococcus aureus POSITIVE (Maleena Eddleman) NEGATIVE Final    Comment: RESULT CALLED TO, READ BACK BY AND VERIFIED WITH: Myosha Cuadras. PRIDDY,RN 1910 10/30/2016 T. TYSOR (NOTE) The Xpert SA Assay (FDA approved for NASAL specimens in patients 5 years of age and older), is one component of Shamari Lofquist comprehensive surveillance program. It is not intended to diagnose infection nor to guide or monitor treatment.          Radiology Studies: Pelvis Portable  Result Date: 10/31/2016 CLINICAL DATA:  Arthroplasty left hip. EXAM: PORTABLE PELVIS 1-2 VIEWS COMPARISON:  Plain film of the pelvis dated 08/25/2009. FINDINGS: Status post left hip arthroplasty. Hardware appears intact and appropriately position. Right hip arthroplasty is stable in position. Overall osseous alignment appears anatomic. Expected postsurgical changes within the soft tissues about the left hip. IMPRESSION: Status post left hip arthroplasty. Hardware appears intact and appropriately positioned. No evidence of surgical complicating feature. Electronically Signed   By: Franki Cabot M.D.   On: 10/31/2016 12:08        Scheduled Meds: . acetaminophen  1,000 mg Oral Q8H  . chlorhexidine  60 mL Topical Once  . diltiazem  30 mg Oral Q8H  . escitalopram  5 mg Oral Daily  . feeding supplement (ENSURE ENLIVE)  237 mL Oral BID BM  . levothyroxine  25 mcg Oral QAC breakfast  . memantine  28 mg Oral Daily  . mupirocin ointment  1 application Nasal BID  . nebivolol  10 mg Oral Daily  . polyethylene glycol  17 g Oral Daily  . polyvinyl alcohol  1 drop Both Eyes QID  . povidone-iodine  2 application Topical Once  . warfarin  3.5 mg Oral ONCE-1800  . Warfarin - Pharmacist Dosing Inpatient   Does not apply q1800   Continuous Infusions: . sodium chloride 50 mL/hr at 10/31/16 1335  . sodium chloride    .  ceFAZolin (ANCEF) IV    . [START ON 11/01/2016]  ceFAZolin (ANCEF) IV    .  methocarbamol (ROBAXIN)  IV       LOS: 4 days    Time spent: 15 minutes    Alwaleed Obeso Melven Sartorius, MD [facility] [phone]  If 7PM-7AM,  please contact night-coverage [email or web address] Password [email or web address] 10/31/2016, 3:04 PM

## 2016-11-01 ENCOUNTER — Encounter (HOSPITAL_COMMUNITY): Payer: Self-pay | Admitting: Orthopedic Surgery

## 2016-11-01 ENCOUNTER — Inpatient Hospital Stay (HOSPITAL_COMMUNITY): Payer: PPO

## 2016-11-01 DIAGNOSIS — R0902 Hypoxemia: Secondary | ICD-10-CM

## 2016-11-01 LAB — CBC
HCT: 31.2 % — ABNORMAL LOW (ref 36.0–46.0)
Hemoglobin: 10 g/dL — ABNORMAL LOW (ref 12.0–15.0)
MCH: 30.4 pg (ref 26.0–34.0)
MCHC: 32.1 g/dL (ref 30.0–36.0)
MCV: 94.8 fL (ref 78.0–100.0)
PLATELETS: 179 10*3/uL (ref 150–400)
RBC: 3.29 MIL/uL — AB (ref 3.87–5.11)
RDW: 14.5 % (ref 11.5–15.5)
WBC: 6 10*3/uL (ref 4.0–10.5)

## 2016-11-01 LAB — BASIC METABOLIC PANEL
Anion gap: 6 (ref 5–15)
BUN: 32 mg/dL — ABNORMAL HIGH (ref 6–20)
CHLORIDE: 113 mmol/L — AB (ref 101–111)
CO2: 21 mmol/L — ABNORMAL LOW (ref 22–32)
CREATININE: 0.9 mg/dL (ref 0.44–1.00)
Calcium: 8.3 mg/dL — ABNORMAL LOW (ref 8.9–10.3)
GFR calc non Af Amer: 51 mL/min — ABNORMAL LOW (ref >60)
GFR, EST AFRICAN AMERICAN: 59 mL/min — AB (ref >60)
Glucose, Bld: 149 mg/dL — ABNORMAL HIGH (ref 65–99)
POTASSIUM: 3.8 mmol/L (ref 3.5–5.1)
SODIUM: 140 mmol/L (ref 135–145)

## 2016-11-01 LAB — PROTIME-INR
INR: 1.33
Prothrombin Time: 16.4 seconds — ABNORMAL HIGH (ref 11.4–15.2)

## 2016-11-01 MED ORDER — WARFARIN SODIUM 2 MG PO TABS
2.0000 mg | ORAL_TABLET | Freq: Once | ORAL | Status: AC
  Administered 2016-11-01: 2 mg via ORAL
  Filled 2016-11-01: qty 1

## 2016-11-01 MED ORDER — VITAMIN D 1000 UNITS PO TABS
2000.0000 [IU] | ORAL_TABLET | Freq: Every day | ORAL | Status: DC
  Administered 2016-11-02 – 2016-11-03 (×2): 2000 [IU] via ORAL
  Filled 2016-11-01 (×2): qty 2

## 2016-11-01 MED ORDER — ENOXAPARIN SODIUM 40 MG/0.4ML ~~LOC~~ SOLN: 40.0000 mg | SUBCUTANEOUS | Status: DC

## 2016-11-01 MED ORDER — FUROSEMIDE 10 MG/ML IJ SOLN
10.0000 mg | Freq: Once | INTRAMUSCULAR | Status: AC
  Administered 2016-11-01: 10 mg via INTRAVENOUS
  Filled 2016-11-01: qty 2

## 2016-11-01 NOTE — NC FL2 (Signed)
[place] LEVEL OF CARE SCREENING TOOL     IDENTIFICATION  Patient Name: Patricia Nelson Birthdate: 1915-05-24 Sex: female Admission Date (Current Location): 10/27/2016  [facility] and [place] Number:  Herbalist and Address:  [facility], [place] [address] [place], [place] [address]      Provider Number: [id number]  Attending Physician Name and Address:  Ladene Artist., MD  Relative Name and Phone Number:  Deberah Castle, daughter, [phone]    Current Level of Care: Hospital Recommended Level of Care: [place] Prior Approval Number:    Date Approved/Denied: 11/01/16 PASRR Number: [id number]  Discharge Plan: SNF    Current Diagnoses: Patient Active Problem List   Diagnosis Date Noted  . Atherosclerotic cerebrovascular disease   . Acute pain of left thigh 10/27/2016  . Bradycardia 10/27/2016  . Closed left femoral fracture ([place]) 10/27/2016  . Hip fracture ([place]) 10/27/2016  . Preoperative clearance 09/21/2016  . Anticoagulation goal of INR 2 to 3 09/06/2016  . Tachycardia 09/03/2016  . Laceration of skin of right elbow 09/03/2016  . Congestive heart failure (CHF) ([place]) 09/03/2016  . Fall 09/03/2016  . Pneumonia 09/02/2016  . Edema 08/30/2016  . Stye external 08/23/2016  . Advanced atrophic nonexudative age-related macular degeneration of both eyes with subfoveal involvement 08/20/2016  . Ectropion of right lower eyelid 06/28/2016  . Vomiting 06/09/2016  . UTI (urinary tract infection) 02/02/2016  . Osteoarthritis 11/24/2015  . Pruritus 11/06/2014  . Anemia of chronic disease 06/28/2013  . CKD (chronic kidney disease) stage 3, GFR 30-59 ml/min 06/28/2013  . Hypothyroidism 06/28/2013  . Constipation 06/28/2013  . Depression with anxiety 06/28/2013  . Senile dementia 06/28/2013  . Osteoporosis, unspecified 06/28/2013  . Insomnia 06/28/2013  . Urinary frequency 06/28/2013  . Hiatal hernia  06/28/2013  . A-fib ([place]) 06/28/2013  . Hx of syncope 06/28/2013  . Hemorrhoids 06/28/2013  . HOH (hard of hearing) 06/28/2013  . Diverticulosis 06/28/2013  . Hx of pancreatitis 06/28/2013  . HTN (hypertension) 10/18/2012  . Hypercholesteremia 10/18/2012  . Carotid atherosclerosis 10/18/2012  . H/O acute pancreatitis 10/18/2012    Orientation RESPIRATION BLADDER Height & Weight     Self  O2 (Nasal Cannula 2.5 L) Incontinent, Indwelling catheter Weight:   Height:     BEHAVIORAL SYMPTOMS/MOOD NEUROLOGICAL BOWEL NUTRITION STATUS      Continent Diet (See DC Summary)  AMBULATORY STATUS COMMUNICATION OF NEEDS Skin   Extensive Assist Non-Verbally Surgical wounds                       Personal Care Assistance Level of Assistance  Dressing, Bathing, Feeding Bathing Assistance: Maximum assistance Feeding assistance: Limited assistance Dressing Assistance: Maximum assistance     Functional Limitations Info             SPECIAL CARE FACTORS FREQUENCY  OT (By licensed OT), PT (By licensed PT)     PT Frequency: 3x week OT Frequency: 2x week            Contractures      Additional Factors Info  Code Status, Allergies, Psychotropic, Isolation Precautions Code Status Info: DNR Allergies Info: ACE INHIBITORS, MIRTAZAPINE, PENICILLINS  Psychotropic Info: lexapro   Isolation Precautions Info: MRSA     Current Medications (11/01/2016):  This is the current hospital active medication list Current Facility-Administered Medications  Medication Dose Route Frequency Provider Last Rate Last Dose  . 0.9 %  sodium chloride  infusion   Intravenous Continuous Paralee Cancel, MD 50 mL/hr at 10/31/16 1335    . 0.9 %  sodium chloride infusion   Intravenous Continuous Paralee Cancel, MD 50 mL/hr at 11/01/16 0600    . acetaminophen (TYLENOL) tablet 650 mg  650 mg Oral Q6H PRN Paralee Cancel, MD       Or  . acetaminophen (TYLENOL) suppository 650 mg  650 mg Rectal Q6H PRN Paralee Cancel,  MD      . acetaminophen (TYLENOL) tablet 1,000 mg  1,000 mg Oral Q8H Ladene Artist., MD   1,000 mg at 11/01/16 1314  . alum & mag hydroxide-simeth (MAALOX/MYLANTA) 200-200-20 MG/5ML suspension 30 mL  30 mL Oral Q4H PRN Paralee Cancel, MD      . chlorhexidine (HIBICLENS) 4 % liquid 4 application  60 mL Topical Once Paralee Cancel, MD      . Derrill Memo ON 11/02/2016] cholecalciferol (VITAMIN D) tablet 2,000 Units  2,000 Units Oral Daily Ladene Artist., MD      . diltiazem (CARDIZEM) tablet 30 mg  30 mg Oral Q8H Doutova, Anastassia, MD   30 mg at 11/01/16 1314  . escitalopram (LEXAPRO) tablet 5 mg  5 mg Oral Daily Doutova, Anastassia, MD   5 mg at 11/01/16 0819  . feeding supplement (ENSURE ENLIVE) (ENSURE ENLIVE) liquid 237 mL  237 mL Oral BID BM Ladene Artist., MD   237 mL at 11/01/16 1400  . levothyroxine (SYNTHROID, LEVOTHROID) tablet 25 mcg  25 mcg Oral QAC breakfast Doutova, Anastassia, MD   25 mcg at 11/01/16 0818  . memantine (NAMENDA XR) 24 hr capsule 28 mg  28 mg Oral Daily Doutova, Anastassia, MD   28 mg at 11/01/16 0819  . menthol-cetylpyridinium (CEPACOL) lozenge 3 mg  1 lozenge Oral PRN Paralee Cancel, MD       Or  . phenol (CHLORASEPTIC) mouth spray 1 spray  1 spray Mouth/Throat PRN Paralee Cancel, MD      . methocarbamol (ROBAXIN) tablet 500 mg  500 mg Oral Q6H PRN Toy Baker, MD       Or  . methocarbamol (ROBAXIN) 500 mg in dextrose 5 % 50 mL IVPB  500 mg Intravenous Q6H PRN Doutova, Anastassia, MD      . metoCLOPramide (REGLAN) tablet 5-10 mg  5-10 mg Oral Q8H PRN Paralee Cancel, MD       Or  . metoCLOPramide (REGLAN) injection 5-10 mg  5-10 mg Intravenous Q8H PRN Paralee Cancel, MD      . mupirocin ointment (BACTROBAN) 2 % 1 application  1 application Nasal BID Ladene Artist., MD   1 application at 96/29/52 [phone]  . nebivolol (BYSTOLIC) tablet 10 mg  10 mg Oral Daily Toy Baker, MD   10 mg at 11/01/16 0818  . ondansetron (ZOFRAN) tablet 4 mg  4 mg Oral  Q6H PRN Paralee Cancel, MD       Or  . ondansetron [facility]) injection 4 mg  4 mg Intravenous Q6H PRN Paralee Cancel, MD      . polyethylene glycol (MIRALAX / GLYCOLAX) packet 17 g  17 g Oral Daily PRN Doutova, Anastassia, MD      . polyethylene glycol (MIRALAX / GLYCOLAX) packet 17 g  17 g Oral Daily Ladene Artist., MD   17 g at 11/01/16 [phone]  . polyvinyl alcohol (LIQUIFILM TEARS) 1.4 % ophthalmic solution 1 drop  1 drop Both Eyes QID Ladene Artist., MD   1  drop at 11/01/16 1400  . povidone-iodine 10 % swab 2 application  2 application Topical Once Paralee Cancel, MD      . traMADol Veatrice Bourbon) tablet 50 mg  50 mg Oral Q6H PRN Paralee Cancel, MD      . warfarin (COUMADIN) tablet 2 mg  2 mg Oral ONCE-1800 Priscella Mann, RPH      . Warfarin - Pharmacist Dosing Inpatient   Does not apply q1800 Tyrone Apple, [facility]         Discharge Medications: Please see discharge summary for a list of discharge medications.  Relevant Imaging Results:  Relevant Lab Results:   Additional Information SS#: [place], LCSW

## 2016-11-01 NOTE — Evaluation (Signed)
Physical Therapy Evaluation Patient Details Name: Patricia Nelson MRN: [id number] DOB: [birth year] Today's Date: 11/01/2016   History of Present Illness  Patricia Ancona Gordonis a 83 y.o.femalewith medical history significant of anemia, chronic kidney disease, depression, HTN, HLD, macular degeneration, osteoporosis, atrial fibrillation chronic anticoagulant, RTHA. Presented with fall resulting in severe pain of her left hip and upper thigh. Sustained L femoral neck fx and underwent L hemiarthroplasty 10/31/16.  Clinical Impression  Pt admitted with above diagnosis. Pt currently with functional limitations due to the deficits listed below (see PT Problem List). Pt lethargic upon PT/OT arrival but perked up considerably once moving and though L hip painful with movement, pt tolerated sitting EOB and bed to chair squat pivot transfer very well. She is currently requiring +2 max A for mobility.  Pt will benefit from skilled PT to increase their independence and safety with mobility to allow discharge to the venue listed below.       Follow Up Recommendations SNF    Equipment Recommendations  None recommended by PT    Recommendations for Other Services       Precautions / Restrictions Precautions Precautions: Fall;Posterior Hip Precaution Booklet Issued: Yes (comment) Precaution Comments: handout issued and reviewed with Pt's family as pt with baseline dementia; (no formal order for posterior precautions) Restrictions Weight Bearing Restrictions: Yes LLE Weight Bearing: Weight bearing as tolerated Other Position/Activity Restrictions: vision and hearing deficits      Mobility  Bed Mobility Overal bed mobility: Needs Assistance Bed Mobility: Supine to Sit     Supine to sit: Max assist;+2 for physical assistance;+2 for safety/equipment     General bed mobility comments: assist to advance LEs over EOB and to bring trunk into upright position; Pt requires MaxA to maintain static  sitting EOB as Pt with strong lateral lean to R due to painful L hip   Transfers Overall transfer level: Needs assistance Equipment used: 2 person hand held assist Transfers: Squat Pivot Transfers     Squat pivot transfers: Max assist;+2 physical assistance;+2 safety/equipment     General transfer comment: assist to rise and advance hips to chair while maintaining hip precautions   Ambulation/Gait             General Gait Details: unable to day  Stairs            Wheelchair Mobility    Modified Rankin (Stroke Patients Only)       Balance Overall balance assessment: Needs assistance;History of Falls Sitting-balance support: Feet supported;Single extremity supported Sitting balance-Leahy Scale: Zero Sitting balance - Comments: Requires maxA to maintain static sitting balance EOB  Postural control: Right lateral lean Standing balance support: Bilateral upper extremity supported Standing balance-Leahy Scale: Zero                               Pertinent Vitals/Pain Pain Assessment: Faces Faces Pain Scale: Hurts even more Pain Location: LLE with movement  Pain Descriptors / Indicators: Grimacing;Sore;Discomfort Pain Intervention(s): Limited activity within patient's tolerance;Monitored during session    Home Living Family/patient expects to be discharged to:: Skilled nursing facility                 Additional Comments: lives on memory unit at [facility]    Prior Function Level of Independence: Needs assistance   Gait / Transfers Assistance Needed: per family Pt was using RW for [employer]  ADL's / Homemaking Assistance Needed: Pt was receiving  assist from staff in facility for bathing/dressing ADLs  Comments: Pt's daughter reports she was taking Pt to church, out to meals weekly      Hand Dominance   Dominant Hand: Right    Extremity/Trunk Assessment   Upper Extremity Assessment Upper Extremity Assessment: Defer to OT  evaluation    Lower Extremity Assessment Lower Extremity Assessment: Generalized weakness    Cervical / Trunk Assessment Cervical / Trunk Assessment: Kyphotic  Communication   Communication: HOH  Cognition Arousal/Alertness: Awake/alert;Lethargic Behavior During Therapy: WFL for tasks assessed/performed Overall Cognitive Status: History of cognitive impairments - at baseline                                 General Comments: baseline dementia; Pt follows one step commands with multimodal cues and increased time/repetition. Was lethargic at the beginning but more alert as she sat up      General Comments General comments (skin integrity, edema, etc.): reviewed precautions with pt but reviewed more thoroughly with family as pt will not likely remember her precautions    Exercises Total Joint Exercises Ankle Circles/Pumps: AROM;Both;10 reps;Seated   Assessment/Plan    PT Assessment Patient needs continued PT services  PT Problem List Decreased strength;Decreased activity tolerance;Decreased balance;Decreased range of motion;Decreased mobility;Decreased coordination;Decreased cognition;Decreased knowledge of use of DME;Decreased knowledge of precautions;Pain;Decreased safety awareness       PT Treatment Interventions DME instruction;Gait training;Functional mobility training;Therapeutic activities;Therapeutic exercise;Balance training;Neuromuscular re-education;Cognitive remediation;Patient/family education    PT Goals (Current goals can be found in the Care Plan section)  Acute Rehab PT Goals Patient Stated Goal: regain mobility; less pain  PT Goal Formulation: With patient/family Time For Goal Achievement: 11/15/16 Potential to Achieve Goals: Fair    Frequency Min 3X/week   Barriers to discharge        Co-evaluation PT/OT/SLP Co-Evaluation/Treatment: Yes Reason for Co-Treatment: For patient/therapist safety PT goals addressed during session:  Mobility/safety with mobility;Balance         AM-PAC PT "6 Clicks" Daily Activity  Outcome Measure Difficulty turning over in bed (including adjusting bedclothes, sheets and blankets)?: Unable Difficulty moving from lying on back to sitting on the side of the bed? : Unable Difficulty sitting down on and standing up from a chair with arms (e.g., wheelchair, bedside commode, etc,.)?: Unable Help needed moving to and from a bed to chair (including a wheelchair)?: Total Help needed walking in hospital room?: Total Help needed climbing 3-5 steps with a railing? : Total 6 Click Score: 6    End of Session Equipment Utilized During Treatment: Gait belt Activity Tolerance: Patient tolerated treatment well Patient left: in chair;with call bell/phone within reach;with family/visitor present Nurse Communication: Mobility status PT Visit Diagnosis: Difficulty in walking, not elsewhere classified (R26.2);Muscle weakness (generalized) (M62.81);Pain Pain - Right/Left: Left Pain - part of body: Hip    Time: 6294-7654 PT Time Calculation (min) (ACUTE ONLY): 30 min   Charges:   PT Evaluation $PT Eval Moderate Complexity: 1 Mod     PT G Codes:        [place]  [place] 11/01/2016, 1:35 PM

## 2016-11-01 NOTE — Progress Notes (Signed)
  Speech Language Pathology Treatment: Dysphagia  Patient Details Name: Patricia Nelson MRN: [id number] DOB: [birth year] Today's Date: 11/01/2016 Time: 5277-8242 SLP Time Calculation (min) (ACUTE ONLY): 16 min  Assessment / Plan / Recommendation Clinical Impression  Pt observed 1 day post-surgery, appeared more fatigued and less engaged in meal time. Pt showed no s/sx of aspiration while observed with regular diet/thin liquids however, exhibited prolonged mastication possibly due to fatigue from recent hip surgery. Recommend temporary Dys 2 (fine chop) diet with thin liquids to help decrease the amount of effort needed for her to masticate during meals. Discussed this diet change with her daughter, who verbalized agreement. Will f/u to monitor as pt's post-surgery status improves.    HPI HPI: Pt is a 83 y.o.femalewith medical history significant of anemia, chronic kidney disease, depression, dementia, HTN, HLD, macular degeneration, osteoporosis, atrial fibrillation chronic anticoagulant who presented to the ED with fall resulting in severe pain of her left hip and upper thigh. Pt plans to undergo surgery to repair left femoral neck fx, potentially on 9/9.  On 10/27/16, pt was unable to swallow (unable to perform task) when directed to drink water via straw.       SLP Plan  Continue with current plan of care       Recommendations  Diet recommendations: Dysphagia 2 (fine chop) Liquids provided via: Straw;Cup Medication Administration: Whole meds with liquid Supervision: Full supervision/cueing for compensatory strategies Compensations: Slow rate;Small sips/bites Postural Changes and/or Swallow Maneuvers: Seated upright 90 degrees                Oral Care Recommendations: Oral care BID Follow up Recommendations: Skilled Nursing facility SLP Visit Diagnosis: Dysphagia, unspecified (R13.10) Plan: Continue with current plan of care       [place],  Student SLP 11/01/2016, 9:33 AM

## 2016-11-01 NOTE — Social Work (Signed)
CSW called [facility] to advise of the SNF.  They will review clinicals and f/u with CSW for auth for placement.  CSW will continue to follow.  Elissa Hefty, LCSW Clinical Social Worker [phone]

## 2016-11-01 NOTE — Progress Notes (Signed)
ANTICOAGULATION CONSULT NOTE - Follow Up Consult  Pharmacy Consult for Coumadin Indication: atrial fibrillation  Allergies  Allergen Reactions  . Ace Inhibitors Cough  . Mirtazapine Other (See Comments)    Nightmares   . Penicillins Rash    Has patient had a PCN reaction causing immediate rash, facial/tongue/throat swelling, SOB or lightheadedness with hypotension: Yes Has patient had a PCN reaction causing severe rash involving mucus membranes or skin necrosis: Unknown Has patient had a PCN reaction that required hospitalization: Unknown Has patient had a PCN reaction occurring within the last 10 years: Unknown If all of the above answers are "NO", then may proceed with Cephalosporin use.     Patient Measurements: Height = 60 inches Weight = 53.2 kg  Vital Signs: Temp: 98 F (36.7 C) (09/10 0300) Temp Source: Axillary (09/10 0300) BP: 126/58 (09/10 0300) Pulse Rate: 89 (09/10 0300)  Labs:  Recent Labs  10/30/16 0252 10/31/16 0236 11/01/16 0353  HGB 10.7* 10.4* 10.0*  HCT 34.0* 32.9* 31.2*  PLT 136* 158 179  LABPROT 16.7* 16.2* 16.4*  INR 1.37 1.31 1.33  CREATININE 1.01* 0.90 0.90    Estimated Creatinine Clearance: 24.5 mL/min (by C-G formula based on SCr of 0.9 mg/dL).   Assessment: 83 year old female on chronic Coumadin for history of atrial fibrillation. Patient presented s/p fall this admission and Coumadin was reversed with Vitamin K for surgery. Patient is now s/p L-hemiarthroplasty and was resumed on Coumadin therapy on 9/9.   INR today is 1.33 after 3.5 mg on 9/9.  No bleeding reported.  PTA dose was 2mg  daily (this has just been reduced due to elevated INR on 10/13/16).   Goal of Therapy:  INR 2-3 Monitor platelets by anticoagulation protocol: Yes   Plan:  Coumadin 2mg  po x1 at 1800 PM. Daily PT/INR.   Sloan Leiter, PharmD, BCPS Clinical Pharmacist Clinical phone 11/01/2016 until 3:30PM [phone] After hours, please call  #[phone] 11/01/2016,11:25 AM

## 2016-11-01 NOTE — Clinical Social Work Note (Signed)
Clinical Social Work Assessment  Patient Details  Name: Patricia Nelson MRN: [id number] Date of Birth: 09-17-1915  Date of referral:  11/01/16               Reason for consult:  Facility Placement                Permission sought to share information with:  Chartered certified accountant granted to share information::  Yes, Verbal Permission Granted  Name::        Agency::  SNF-[facility]  Relationship::     Contact Information:     Housing/Transportation Living arrangements for the past 2 months:  [place] of Information:    Patient Interpreter Needed:  None Criminal Activity/Legal Involvement Pertinent to Current Situation/Hospitalization:  No - Comment as needed Significant Relationships:    Lives with:    Do you feel safe going back to the place where you live?  No Need for family participation in patient care:  Yes (Comment)  Care giving concerns:  Pt from [facility] where it appears she sustained a fall and fractured her left hip. Pt family amenable to pt returning to ALF and getting her short term rehab in the facility.  Social Worker assessment / plan:  CSW met with patient and family at bedside to discuss SNF options/placement. CSW discussed her role in disposition process. Family wants patient to return to facility and were concerned with additional fees for SNF. CSW encouraged them to follow up with facility directly for specific information. CSW will f/u with ALF on plans for DC.  CSW will have to obtain authorization for SNF placement.  FL2 complete. passr confirmed. Offers to be sent to [facility]  Employment status:  Retired Nurse, adult PT Recommendations:  [place] / Referral to community resources:  [place]  Patient/Family's Response to care:  Family appreciative of CSW assistance with disposition process. No issues or  concerns identified at this time.  Patient/Family's Understanding of and Emotional Response to Diagnosis, Current Treatment, and Prognosis:  Family has good understanding of diagnosis, current treatment and prognosis. They are hopeful that patient will improved with short term rehab in facility. They are concerned about the fees and will f/u with facility directly. No other issues or concerns identified.  Emotional Assessment Appearance:  Appears stated age Attitude/Demeanor/Rapport:   (Cooperative) Affect (typically observed):  Accepting, Appropriate Orientation:  Oriented to Self Alcohol / Substance use:  Not Applicable Psych involvement (Current and /or in the community):  No (Comment)  Discharge Needs  Concerns to be addressed:  Care Coordination Readmission within the last 30 days:  No Current discharge risk:  Dependent with Mobility, Physical Impairment (Dementia) Barriers to Discharge:  No Barriers Identified   [name], LCSW 11/01/2016, 12:35 PM

## 2016-11-01 NOTE — Progress Notes (Signed)
PROGRESS NOTE    Patricia Nelson  ZHG:[id number] DOB: Mar 21, 1915 DOA: 10/27/2016 PCP: Blanchie Serve, MD (Confirm with patient/family/NH records and if not entered, this HAS to be entered at [facility] point of entry. "No PCP" if truly none.)   Brief Narrative: (Start on day 1 of progress note - keep it brief and live) Patricia Nelson is Patricia Nelson 83 y.o. female with medical history significant of anemia, chronic kidney disease, depression, HTN, HLD, macular degeneration, osteoporosis, atrial fibrillation on warfarin, Right total hip replacement who presented with Patricia Nelson fall on 9/5.  Fall not associated with syncope or chest pain.  At baseline, ambulates with walker and is able to recognize her family.  Imaging was notable for displaced fracture of the L femoral neck.    Assessment & Plan:   Active Problems:   HTN (hypertension)   Hypercholesteremia   CKD (chronic kidney disease) stage 3, GFR 30-59 ml/min   Hypothyroidism   Patricia Nelson-fib (HCC)   Congestive heart failure (CHF) (HCC)   Fall   Bradycardia   Closed left femoral fracture (HCC)   Hip fracture (HCC)   Displaced Fracture of L femoral neck s/p L hip hemiarthroplasty  Fall:  Fall may have been mechanical, but unclear hx.  No hx of CP, LOC.   [ ]  INR daily Telemetry notable for afib Warfarin for VTE prophylaxis, discussed with ortho Scheduled APAP, tramadol prn (has not had any pain meds since yesterday morning) Speech rec reg diet, thin liquid, staff to assist with self feeding with full supervision/cueing Voiding, has had BM Vitamin D 20.1 - vitamin D supplementation  Elevated troponin:  Likely 2/2 demand ischemia.  Asx Patricia Nelson  MRSA: Greater than 100,000 unidentified organism.  Continue to monitor for si/sx of infection, but currently asx with no fevers, no elevated WBC, no urinary symptoms.  Urine growing MRSA, which is unusual urinary pathogen.  Repeat Ucx also growing staph aureus.  Will obtain blood cx x2 as well, which are so far  NGTD.  Discussed with Dr. Johnnye Sima from ID who noted if truly asymptomatic and blood cx negative, probably ok not to treat.  Will ctm.   Pulmonary HTN  ? Hx of CHF:  Echo without systolic or diastolic function, but notable for elevated PASP.  F/u outpatient.   Hypoxia:  On 2 L O2.  On O2 intermittently since admission.  Normal HRCXR from admission notable for cardiomegaly and COPD (pt without known hx of COPD or smoking).  On exam, not notable findings, was able to d/c O2 for Patricia Nelson while in room and drifted down to low 90's after Patricia Nelson few minutes, but seems to be requiring it through the day.   [ ]  CXR  CKD stage III:  Baseline Cr seems to fluctuate, but appears around 1.2.  Currently lower than baseline. ctm  Atrial Fibrillation: resume anticoagulation per pharmacy, rate controolled with bystolic and dilt.  Sounds like she does not have Camree [place]ig[place] history of frequent falls on discussion with family.  High chadsvasc (4 due to age, HTN, and F sex). Transient episode of bradycardia noted on admission, but resolved.  Will need anticoagulation for VTE ppx after surgery.  Can have further discussion of risks benefits with PCP as outpatient.    Anemia: slowly downtrending, continue to monitor.  Thrombocytopenia: resolved  Dementia: Was awake sitting up and eating breakfast this morning, but towards the afternoon she seemed to be [name] bit more tired and less cooperative.  Suspect developing some delirium during prolonged hospital stay  with surgery.  Will continue to monitor.  delirium prec namenda  HTN: continue bystolic/diltiazem HLD: home meds Hypothyroidism: synthroid Depression/anxiety: lexapro  Holding home aspirin, will likely recommend discontinuing this at discharge.   DVT prophylaxis: SCD, warfarin Code Status: DNR Family Communication: Daugher and her husband Disposition Plan: pending OR   Consultants:   orthopedics  Procedures: (Don't include imaging studies which can be auto populated. Include  things that cannot be auto populated i.e. Echo, Carotid and venous dopplers, Foley, Bipap, HD, tubes/drains, wound vac, central lines etc)  Echo with normal systolic function, but severely elevated PASP  Antimicrobials: (specify start and planned stop date. Auto populated tables are space occupying and do not give end dates)  none    Subjective: Not complaining of pain at this time.  Sat up to chair with PT.   [name] bit more sleepy and less cooperative in the afternoon.  Objective: Vitals:   11/01/16 0100 11/01/16 0300 11/01/16 1314 11/01/16 1640  BP: (!) 118/55 (!) 126/58 128/72 125/64  Pulse: 91 89 90 96  Resp: 20 (!) 22  16  Temp: 98.4 F (36.9 C) 98 F (36.7 C)  98.2 F (36.8 C)  TempSrc: Axillary Axillary  Axillary  SpO2: 95% 94% 96% 98%    Intake/Output Summary (Last 24 hours) at 11/01/16 1905 Last data filed at 11/01/16 1600  Gross per 24 hour  Intake          3000.83 ml  Output              350 ml  Net          2650.83 ml   There were no vitals filed for this visit.  Examination:  General: No acute distress. Cardiovascular: Heart sounds show [name] regular rate, and rhythm. No gallops or rubs. No murmurs. No JVD. Lungs: Clear to auscultation bilaterally with good air movement. No rales, rhonchi or wheezes. Abdomen: Soft, nontender, nondistended with normal active bowel sounds. No masses. No hepatosplenomegaly. Neurological: Alert and oriented 1. Moves all extremities 4 with equal strength. Cranial nerves II through XII grossly intact. Skin: Warm and dry. No rashes or lesions. Extremities: No clubbing or cyanosis. No edema.    Data Reviewed: I have personally reviewed following labs and imaging studies  CBC:  Recent Labs Lab 10/27/16 1715 10/28/16 0329 10/29/16 1203 10/30/16 0252 10/31/16 0236 11/01/16 0353  WBC 9.6 10.2 7.4 6.6 5.5 6.0  NEUTROABS 8.4*  --   --   --   --   --   HGB 12.4 12.1 11.3* 10.7* 10.4* 10.0*  HCT 39.1 37.5 35.1* 34.0* 32.9* 31.2*    MCV 94.7 94.7 95.6 95.2 95.1 94.8  PLT 174 174 130* 136* 158 416   Basic Metabolic Panel:  Recent Labs Lab 10/28/16 0329 10/29/16 1203 10/30/16 0252 10/31/16 0236 11/01/16 0353  NA 139 141 141 140 140  K 3.7 3.8 3.9 3.7 3.8  CL 111 114* 113* 112* 113*  CO2 21* 20* 22 21* 21*  GLUCOSE 132* 165* 113* 108* 149*  BUN 24* 30* 30* 27* 32*  CREATININE 0.85 0.97 1.01* 0.90 0.90  CALCIUM 9.0 8.5* 8.7* 8.2* 8.3*   GFR: Estimated Creatinine Clearance: 24.5 mL/min (by C-G formula based on SCr of 0.9 mg/dL). Liver Function Tests:  Recent Labs Lab 10/28/16 0329  ALBUMIN 3.0*   No results for input(s): LIPASE, AMYLASE in the last 168 hours. No results for input(s): AMMONIA in the last 168 hours. Coagulation Profile:  Recent Labs Lab  10/28/16 0329 10/29/16 1203 10/30/16 0252 10/31/16 0236 11/01/16 0353  INR 3.06 1.44 1.37 1.31 1.33   Cardiac Enzymes:  Recent Labs Lab 10/28/16 0329 10/28/16 1027 10/28/16 1455 10/28/16 2228 10/29/16 0330  TROPONINI <0.03 0.06* 0.09* 0.05* 0.04*   BNP (last 3 results) No results for input(s): PROBNP in the last 8760 hours. HbA1C: No results for input(s): HGBA1C in the last 72 hours. CBG: No results for input(s): GLUCAP in the last 168 hours. Lipid Profile: No results for input(s): CHOL, HDL, LDLCALC, TRIG, CHOLHDL, LDLDIRECT in the last 72 hours. Thyroid Function Tests: No results for input(s): TSH, T4TOTAL, FREET4, T3FREE, THYROIDAB in the last 72 hours. Anemia Panel: No results for input(s): VITAMINB12, FOLATE, FERRITIN, TIBC, IRON, RETICCTPCT in the last 72 hours. Sepsis Labs: No results for input(s): PROCALCITON, LATICACIDVEN in the last 168 hours.  Recent Results (from the past 240 hour(s))  Culture, Urine     Status: Abnormal   Collection Time: 10/28/16  6:50 AM  Result Value Ref Range Status   Specimen Description URINE, RANDOM  Final   Special Requests NONE  Final   Culture (Annasofia Pohl)  Final    >=100,000 COLONIES/mL  METHICILLIN RESISTANT STAPHYLOCOCCUS AUREUS   Report Status 10/31/2016 FINAL  Final   Organism ID, Bacteria METHICILLIN RESISTANT STAPHYLOCOCCUS AUREUS (Nuria Phebus)  Final      Susceptibility   Methicillin resistant staphylococcus aureus - MIC*    CIPROFLOXACIN >=8 RESISTANT Resistant     GENTAMICIN <=0.5 SENSITIVE Sensitive     NITROFURANTOIN <=16 SENSITIVE Sensitive     OXACILLIN >=4 RESISTANT Resistant     TETRACYCLINE <=1 SENSITIVE Sensitive     VANCOMYCIN <=0.5 SENSITIVE Sensitive     TRIMETH/SULFA <=10 SENSITIVE Sensitive     CLINDAMYCIN RESISTANT Resistant     RIFAMPIN <=0.5 SENSITIVE Sensitive     Inducible Clindamycin POSITIVE Resistant     * >=100,000 COLONIES/mL METHICILLIN RESISTANT STAPHYLOCOCCUS AUREUS  Surgical PCR screen     Status: Abnormal   Collection Time: 10/30/16  4:30 PM  Result Value Ref Range Status   MRSA, PCR POSITIVE (Landri Dorsainvil) NEGATIVE Final    Comment: RESULT CALLED TO, READ BACK BY AND VERIFIED WITH: French Kendra. PRIDDY,RN 1910 10/30/2016 T. TYSOR    Staphylococcus aureus POSITIVE (Dennie Vecchio) NEGATIVE Final    Comment: RESULT CALLED TO, READ BACK BY AND VERIFIED WITH: Talecia Sherlin. PRIDDY,RN 1910 10/30/2016 T. TYSOR (NOTE) The Xpert SA Assay (FDA approved for NASAL specimens in patients 33 years of age and older), is one component of Elisse Pennick comprehensive surveillance program. It is not intended to diagnose infection nor to guide or monitor treatment.   Urine Culture     Status: Abnormal (Preliminary result)   Collection Time: 10/31/16 11:19 AM  Result Value Ref Range Status   Specimen Description URINE, CATHETERIZED  Final   Special Requests NONE  Final   Culture >=100,000 COLONIES/mL STAPHYLOCOCCUS AUREUS (Riyan Gavina)  Final   Report Status PENDING  Incomplete  Culture, blood (routine x 2)     Status: None (Preliminary result)   Collection Time: 10/31/16  3:23 PM  Result Value Ref Range Status   Specimen Description BLOOD LEFT HAND  Final   Special Requests IN PEDIATRIC BOTTLE Blood Culture adequate  volume  Final   Culture NO GROWTH < 24 HOURS  Final   Report Status PENDING  Incomplete  Culture, blood (routine x 2)     Status: None (Preliminary result)   Collection Time: 10/31/16  3:28 PM  Result Value Ref Range Status  Specimen Description BLOOD LEFT HAND  Final   Special Requests IN PEDIATRIC BOTTLE Blood Culture adequate volume  Final   Culture NO GROWTH < 24 HOURS  Final   Report Status PENDING  Incomplete         Radiology Studies: Pelvis Portable  Result Date: 10/31/2016 CLINICAL DATA:  Arthroplasty left hip. EXAM: PORTABLE PELVIS 1-2 VIEWS COMPARISON:  Plain film of the pelvis dated 08/25/2009. FINDINGS: Status post left hip arthroplasty. Hardware appears intact and appropriately position. Right hip arthroplasty is stable in position. Overall osseous alignment appears anatomic. Expected postsurgical changes within the soft tissues about the left hip. IMPRESSION: Status post left hip arthroplasty. Hardware appears intact and appropriately positioned. No evidence of surgical complicating feature. Electronically Signed   By: Franki Cabot M.D.   On: 10/31/2016 12:08        Scheduled Meds: . acetaminophen  1,000 mg Oral Q8H  . chlorhexidine  60 mL Topical Once  . [START ON 11/02/2016] cholecalciferol  2,000 Units Oral Daily  . diltiazem  30 mg Oral Q8H  . escitalopram  5 mg Oral Daily  . feeding supplement (ENSURE ENLIVE)  237 mL Oral BID BM  . levothyroxine  25 mcg Oral QAC breakfast  . memantine  28 mg Oral Daily  . mupirocin ointment  1 application Nasal BID  . nebivolol  10 mg Oral Daily  . polyethylene glycol  17 g Oral Daily  . polyvinyl alcohol  1 drop Both Eyes QID  . povidone-iodine  2 application Topical Once  . Warfarin - Pharmacist Dosing Inpatient   Does not apply q1800   Continuous Infusions: . sodium chloride 50 mL/hr at 10/31/16 1335  . sodium chloride 50 mL/hr at 11/01/16 0600     LOS: 5 days    Time spent: 25 minutes    Fayrene Helper,  MD [facility] [phone]  If 7PM-7AM, please contact night-coverage [email or web address] Password [email or web address] 11/01/2016, 7:05 PM

## 2016-11-01 NOTE — Evaluation (Signed)
Occupational Therapy Evaluation Patient Details Name: PSALM ARMAN MRN: [id number] DOB: February 19, 1916 Today's Date: 11/01/2016    History of Present Illness Suman Trivedi Gordonis a 83 y.o.femalewith medical history significant of anemia, chronic kidney disease, depression, HTN, HLD, macular degeneration, osteoporosis, atrial fibrillation chronic anticoagulant, RTHA. Presented with fall resulting in severe pain of her left hip and upper thigh. Sustained L femoral neck fx and underwent L hemiarthroplasty 10/31/16.   Clinical Impression   This 83 y/o F presents with the above. Pt resides at a nursing facility in the memory care unit where she was previously Mod independent with functional mobility and receiving assist for ADL completion. Pt currently requires MaxA +2 for squat pivot transfers, MaxA for ADL completion. Will continue to follow acutely and recommend additional OT services in SNF setting after discharge to maximize Pt's safety and independence with ADLs and functional mobility.     Follow Up Recommendations  SNF    Equipment Recommendations  Other (comment) (TBD in next venue of care )           Precautions / Restrictions Precautions Precautions: Fall;Posterior Hip Precaution Booklet Issued: Yes (comment) Precaution Comments: handout issued and reviewed with Pt's family as pt with baseline dementia; (no formal order for posterior precautions) Restrictions Weight Bearing Restrictions: Yes LLE Weight Bearing: Weight bearing as tolerated Other Position/Activity Restrictions: vision and hearing deficits      Mobility Bed Mobility Overal bed mobility: Needs Assistance Bed Mobility: Supine to Sit     Supine to sit: Max assist;+2 for physical assistance;+2 for safety/equipment     General bed mobility comments: assist to advance LEs over EOB and to bring trunk into upright position; Pt requires MaxA to maintain static sitting EOB as Pt with strong lateral lean to R due  to painful L hip   Transfers Overall transfer level: Needs assistance Equipment used: 2 person hand held assist Transfers: Squat Pivot Transfers     Squat pivot transfers: Max assist;+2 physical assistance;+2 safety/equipment     General transfer comment: assist to rise and advance hips to chair while maintaining hip precautions     Balance Overall balance assessment: Needs assistance;History of Falls Sitting-balance support: Feet supported;Single extremity supported Sitting balance-Leahy Scale: Zero Sitting balance - Comments: Requires maxA to maintain static sitting balance EOB  Postural control: Right lateral lean Standing balance support: Bilateral upper extremity supported Standing balance-Leahy Scale: Zero                             ADL either performed or assessed with clinical judgement   ADL Overall ADL's : Needs assistance/impaired Eating/Feeding: Set up;Sitting   Grooming: Minimal assistance;Sitting;Wash/dry face   Upper Body Bathing: Minimal assistance;Sitting   Lower Body Bathing: Maximal assistance;Sitting/lateral leans;+2 for physical assistance   Upper Body Dressing : Minimal assistance;Sitting   Lower Body Dressing: Maximal assistance;+2 for physical assistance;+2 for safety/equipment;Sit to/from stand   Toilet Transfer: Maximal assistance;+2 for safety/equipment;+2 for physical assistance;Squat-pivot;BSC Toilet Transfer Details (indicate cue type and reason): simulated in bed to chair transfer  [place] and Hygiene: Total assistance;+2 for physical assistance;+2 for safety/equipment;Sitting/lateral lean       Functional mobility during ADLs: Maximal assistance;+2 for physical assistance;+2 for safety/equipment                           Pertinent Vitals/Pain Pain Assessment: Faces Faces Pain Scale: Hurts even more Pain Location:  LLE with movement  Pain Descriptors / Indicators:  Grimacing;Sore;Discomfort Pain Intervention(s): Limited activity within patient's tolerance;Monitored during session     Hand Dominance Right   Extremity/Trunk Assessment Upper Extremity Assessment Upper Extremity Assessment: Defer to OT evaluation   Lower Extremity Assessment Lower Extremity Assessment: Generalized weakness   Cervical / Trunk Assessment Cervical / Trunk Assessment: Kyphotic   Communication Communication Communication: HOH   Cognition Arousal/Alertness: Awake/alert;Lethargic Behavior During Therapy: WFL for tasks assessed/performed Overall Cognitive Status: History of cognitive impairments - at baseline                                 General Comments: baseline dementia; Pt follows one step commands with multimodal cues and increased time/repetition. Was lethargic at the beginning but more alert as she sat up   General Comments  reviewed precautions with pt but reviewed more thoroughly with family as pt will not likely remember her precautions              Home Living Family/patient expects to be discharged to:: Skilled nursing facility                                 Additional Comments: lives on memory unit at [facility]      Prior Functioning/Environment Level of Independence: Needs assistance  Gait / Transfers Assistance Needed: per family Pt was using RW for [employer] ADL's / Homemaking Assistance Needed: Pt was receiving assist from staff in facility for bathing/dressing ADLs   Comments: Pt's daughter reports she was taking Pt to church, out to meals weekly         OT Problem List: Decreased strength;Impaired balance (sitting and/or standing);Decreased range of motion;Decreased activity tolerance;Decreased knowledge of use of DME or AE;Decreased knowledge of precautions      OT Treatment/Interventions: Self-care/ADL training;DME and/or AE instruction;Therapeutic activities;Balance training;Therapeutic  exercise;Patient/family education    OT Goals(Current goals can be found in the care plan section) Acute Rehab OT Goals Patient Stated Goal: regain mobility; less pain  OT Goal Formulation: With patient Time For Goal Achievement: 11/15/16 Potential to Achieve Goals: Good  OT Frequency: Min 2X/week               Co-evaluation PT/OT/SLP Co-Evaluation/Treatment: Yes Reason for Co-Treatment: For patient/therapist safety PT goals addressed during session: Mobility/safety with mobility;Balance OT goals addressed during session: ADL's and self-care      AM-PAC PT "6 Clicks" Daily Activity     Outcome Measure Help from another person eating meals?: A Little Help from another person taking care of personal grooming?: A Little Help from another person toileting, which includes using toliet, bedpan, or urinal?: A Lot Help from another person bathing (including washing, rinsing, drying)?: A Lot Help from another person to put on and taking off regular upper body clothing?: A Little Help from another person to put on and taking off regular lower body clothing?: A Lot 6 Click Score: 15   End of Session Equipment Utilized During Treatment: Gait belt Nurse Communication: Mobility status  Activity Tolerance: Patient tolerated treatment well Patient left: in chair;with call bell/phone within reach;with family/visitor present  OT Visit Diagnosis: Unsteadiness on feet (R26.81);Muscle weakness (generalized) (M62.81)                Time: 1191-4782 OT Time Calculation (min): 30 min Charges:  OT General Charges $OT Visit: 1  Visit OT Evaluation $OT Eval Moderate Complexity: 1 Mod G-Codes:     Lou Cal, OT Pager [phone] 11/01/2016   Raymondo Band 11/01/2016, 1:39 PM

## 2016-11-01 NOTE — Social Work (Addendum)
CSW confirmed with Angela Nevin that patient is from [facility] and patient can return.  CSW awaiting for PT/OT to work with patient so that CSW can complete FL2 and f/u on Insurance auth from [place] advantage.  CSW will continue to follow.  Elissa Hefty, LCSW Clinical Social Worker [phone]

## 2016-11-02 ENCOUNTER — Inpatient Hospital Stay (HOSPITAL_COMMUNITY): Payer: PPO

## 2016-11-02 LAB — BASIC METABOLIC PANEL
ANION GAP: 7 (ref 5–15)
BUN: 24 mg/dL — ABNORMAL HIGH (ref 6–20)
CHLORIDE: 112 mmol/L — AB (ref 101–111)
CO2: 20 mmol/L — AB (ref 22–32)
Calcium: 8.3 mg/dL — ABNORMAL LOW (ref 8.9–10.3)
Creatinine, Ser: 0.8 mg/dL (ref 0.44–1.00)
GFR calc non Af Amer: 59 mL/min — ABNORMAL LOW (ref >60)
Glucose, Bld: 110 mg/dL — ABNORMAL HIGH (ref 65–99)
POTASSIUM: 3.9 mmol/L (ref 3.5–5.1)
Sodium: 139 mmol/L (ref 135–145)

## 2016-11-02 LAB — URINE CULTURE

## 2016-11-02 LAB — CBC
HCT: 29.8 % — ABNORMAL LOW (ref 36.0–46.0)
HEMOGLOBIN: 9.5 g/dL — AB (ref 12.0–15.0)
MCH: 30.6 pg (ref 26.0–34.0)
MCHC: 31.9 g/dL (ref 30.0–36.0)
MCV: 96.1 fL (ref 78.0–100.0)
PLATELETS: 163 10*3/uL (ref 150–400)
RBC: 3.1 MIL/uL — AB (ref 3.87–5.11)
RDW: 14.6 % (ref 11.5–15.5)
WBC: 7.4 10*3/uL (ref 4.0–10.5)

## 2016-11-02 LAB — PROTIME-INR
INR: 1.47
Prothrombin Time: 17.7 seconds — ABNORMAL HIGH (ref 11.4–15.2)

## 2016-11-02 MED ORDER — FUROSEMIDE 10 MG/ML IJ SOLN
20.0000 mg | Freq: Once | INTRAMUSCULAR | Status: DC
Start: 2016-11-02 — End: 2016-11-02

## 2016-11-02 MED ORDER — FUROSEMIDE 10 MG/ML IJ SOLN: 10.0000 mg | Freq: Once | INTRAMUSCULAR | Status: DC

## 2016-11-02 MED ORDER — FUROSEMIDE 10 MG/ML IJ SOLN
10.0000 mg | Freq: Once | INTRAMUSCULAR | Status: AC
  Administered 2016-11-02: 10 mg via INTRAVENOUS
  Filled 2016-11-02: qty 2

## 2016-11-02 MED ORDER — WARFARIN SODIUM 2 MG PO TABS
2.0000 mg | ORAL_TABLET | Freq: Once | ORAL | Status: AC
  Administered 2016-11-02: 2 mg via ORAL
  Filled 2016-11-02: qty 1

## 2016-11-02 NOTE — Care Management Note (Addendum)
Case Management Note  Patient Details  Name: Patricia Nelson MRN: [id number] Date of Birth: 07/23/1915  Subjective/Objective:                 Patient admitted from [facility]. Per CSW, family does not want intensive nursing care, they want her to return to memory care. They would like to use [facility] PT therapies provided through [facility].   Action/Plan:  Return to ALF with PT at facility (CSW will include on FL2).   Expected Discharge Date:                  Expected Discharge Plan:  Assisted Living / Rest Home ([place])  In-House Referral:  Clinical Social Work  Discharge planning Services  CM Consult  Post Acute Care Choice:    Choice offered to:     DME Arranged:    DME Agency:     HH Arranged:    [place] Agency:     Status of Service:  In process, will continue to follow  If discussed at Long Length of Stay Meetings, dates discussed:    Additional Comments:  Carles Collet, RN 11/02/2016, 11:01 AM

## 2016-11-02 NOTE — Progress Notes (Addendum)
PROGRESS NOTE    Patricia Nelson  HBZ:[id number] DOB: November 07, 1915 DOA: 10/27/2016 PCP: Blanchie Serve, MD (Confirm with patient/family/NH records and if not entered, this HAS to be entered at [facility] point of entry. "No PCP" if truly none.)   Brief Narrative: (Start on day 1 of progress note - keep it brief and live) Patricia Nelson is a 83 y.o. female with medical history significant of anemia, chronic kidney disease, depression, HTN, HLD, macular degeneration, osteoporosis, atrial fibrillation on warfarin, Right total hip replacement who presented with a fall on 9/5.  Fall not associated with syncope or chest pain.  At baseline, ambulates with walker and is able to recognize her family.  Imaging was notable for displaced fracture of the L femoral neck.    S/p L hip hemiarthroplasty.  Ready to d/c to SNF at this time, but will plan for tomorrow to ensure stable from resp standpoint with recent pulm edema requiring lasix.   Assessment & Plan:   Active Problems:   HTN (hypertension)   Hypercholesteremia   CKD (chronic kidney disease) stage 3, GFR 30-59 ml/min   Hypothyroidism   A-fib (HCC)   Congestive heart failure (CHF) (HCC)   Fall   Bradycardia   Closed left femoral fracture (HCC)   Hip fracture (HCC)   Displaced Fracture of L femoral neck s/p L hip hemiarthroplasty  Fall:  Fall may have been mechanical, but unclear hx.  No hx of CP, LOC.   [ ]  INR daily increasing appropriately Telemetry notable for afib Warfarin for VTE prophylaxis, discussed with ortho Scheduled APAP, she doesn't seem to be in pain, will d/c tramadol as she hasn't received this. Speech rec reg diet, thin liquid, staff to assist with self feeding with full supervision/cueing Voiding, has had BM Vitamin D 20.1 - vitamin D supplementation  Elevated troponin:  Likely 2/2 demand ischemia in setting of fall.  Asx [name]  MRSA: Greater than 100,000 unidentified organism.  Continue to monitor for si/sx of  infection, but currently asx with no fevers, no elevated WBC, no urinary symptoms.  Urine growing MRSA, which is unusual urinary pathogen.  Repeat Ucx also growing staph aureus.  Will obtain blood cx x2 as well, which are so far NGTD x 2 days.  Discussed with Dr. Johnnye Sima from ID who noted if truly asymptomatic and blood cx negative, probably ok not to treat.  Will ctm.   Pulmonary HTN  ? Hx of CHF:  Echo without systolic or diastolic function, but notable for elevated PASP.  F/u outpatient.   Hypoxia:  On 2 L O2 yesterday.  CXR yesterday with mild to moder bilateral pleural effusions and mild CHF.  Received lasix last night x1.  CXR this morning again with small to moderate bilateral pleural effusions, repeated lasix 10 mg x1.  At the time of my exam, she'd improved and was on room air.   - ctm - lasix prn  - repeat CXR prn  CKD stage III:  Baseline Cr seems to fluctuate, but appears around 1.2.  Currently lower than baseline. ctm  Atrial Fibrillation: resume anticoagulation per pharmacy, rate controolled with bystolic and dilt.  Sounds like she does not have a history of frequent falls on discussion with family.  High chadsvasc (4 due to age, HTN, and F sex). Transient episode of bradycardia noted on admission, but resolved.  Will need anticoagulation for VTE ppx after surgery.  Can have further discussion of risks benefits with PCP as outpatient with her recent  fall after anticoagulation for surgery complete.    Anemia: slowly downtrending, continue to monitor.  Thrombocytopenia: resolved  Dementia: Was more awake this morning. Still A&Ox1 which seems to be baseline.    delirium prec namenda  HTN: continue bystolic/diltiazem HLD: home meds Hypothyroidism: synthroid Depression/anxiety: lexapro  Holding home aspirin, will likely recommend discontinuing this at discharge.   DVT prophylaxis: SCD, warfarin Code Status: DNR Family Communication: Daugher and her husband Disposition Plan:  pending OR   Consultants:   orthopedics  Procedures: (Don't include imaging studies which can be auto populated. Include things that cannot be auto populated i.e. Echo, Carotid and venous dopplers, Foley, Bipap, HD, tubes/drains, wound vac, central lines etc)  Echo with normal systolic function, but severely elevated PASP  Antimicrobials: (specify start and planned stop date. Auto populated tables are space occupying and do not give end dates)  none    Subjective: No Pain.  Confused.    Objective: Vitals:   11/01/16 0300 11/01/16 1314 11/01/16 1640 11/02/16 1100  BP: (!) 126/58 128/72 125/64   Pulse: 89 90 96   Resp: (!) 22  16   Temp: 98 F (36.7 C)  98.2 F (36.8 C)   TempSrc: Axillary  Axillary   SpO2: 94% 96% 98% 92%    Intake/Output Summary (Last 24 hours) at 11/02/16 1359 Last data filed at 11/02/16 1230  Gross per 24 hour  Intake          2060.83 ml  Output              650 ml  Net          1410.83 ml   There were no vitals filed for this visit.  Examination:  General: No acute distress. Cardiovascular: Heart sounds show a irregular rate, and rhythm. No gallops or rubs. No murmurs. No JVD. Lungs: Clear to auscultation bilaterally.  No crackles appreciated, but limited exam with her limited ability to participate. Abdomen: Soft, nontender, nondistended with normal active bowel sounds. No masses. No hepatosplenomegaly. Neurological: Alert and oriented 3. Cranial nerves II through XII grossly intact. Skin: Warm and dry. No rashes or lesions. Extremities: No clubbing or cyanosis. No edema.  Data Reviewed: I have personally reviewed following labs and imaging studies  CBC:  Recent Labs Lab 10/27/16 1715  10/29/16 1203 10/30/16 0252 10/31/16 0236 11/01/16 0353 11/02/16 0445  WBC 9.6  < > 7.4 6.6 5.5 6.0 7.4  NEUTROABS 8.4*  --   --   --   --   --   --   HGB 12.4  < > 11.3* 10.7* 10.4* 10.0* 9.5*  HCT 39.1  < > 35.1* 34.0* 32.9* 31.2* 29.8*  MCV  94.7  < > 95.6 95.2 95.1 94.8 96.1  PLT 174  < > 130* 136* 158 179 163  < > = values in this interval not displayed. Basic Metabolic Panel:  Recent Labs Lab 10/29/16 1203 10/30/16 0252 10/31/16 0236 11/01/16 0353 11/02/16 0445  NA 141 141 140 140 139  K 3.8 3.9 3.7 3.8 3.9  CL 114* 113* 112* 113* 112*  CO2 20* 22 21* 21* 20*  GLUCOSE 165* 113* 108* 149* 110*  BUN 30* 30* 27* 32* 24*  CREATININE 0.97 1.01* 0.90 0.90 0.80  CALCIUM 8.5* 8.7* 8.2* 8.3* 8.3*   GFR: Estimated Creatinine Clearance: 27.5 mL/min (by C-G formula based on SCr of 0.8 mg/dL). Liver Function Tests:  Recent Labs Lab 10/28/16 0329  ALBUMIN 3.0*   No results for  input(s): LIPASE, AMYLASE in the last 168 hours. No results for input(s): AMMONIA in the last 168 hours. Coagulation Profile:  Recent Labs Lab 10/29/16 1203 10/30/16 0252 10/31/16 0236 11/01/16 0353 11/02/16 0445  INR 1.44 1.37 1.31 1.33 1.47   Cardiac Enzymes:  Recent Labs Lab 10/28/16 0329 10/28/16 1027 10/28/16 1455 10/28/16 2228 10/29/16 0330  TROPONINI <0.03 0.06* 0.09* 0.05* 0.04*   BNP (last 3 results) No results for input(s): PROBNP in the last 8760 hours. HbA1C: No results for input(s): HGBA1C in the last 72 hours. CBG: No results for input(s): GLUCAP in the last 168 hours. Lipid Profile: No results for input(s): CHOL, HDL, LDLCALC, TRIG, CHOLHDL, LDLDIRECT in the last 72 hours. Thyroid Function Tests: No results for input(s): TSH, T4TOTAL, FREET4, T3FREE, THYROIDAB in the last 72 hours. Anemia Panel: No results for input(s): VITAMINB12, FOLATE, FERRITIN, TIBC, IRON, RETICCTPCT in the last 72 hours. Sepsis Labs: No results for input(s): PROCALCITON, LATICACIDVEN in the last 168 hours.  Recent Results (from the past 240 hour(s))  Culture, Urine     Status: Abnormal   Collection Time: 10/28/16  6:50 AM  Result Value Ref Range Status   Specimen Description URINE, RANDOM  Final   Special Requests NONE  Final    Culture (A)  Final    >=100,000 COLONIES/mL METHICILLIN RESISTANT STAPHYLOCOCCUS AUREUS   Report Status 10/31/2016 FINAL  Final   Organism ID, Bacteria METHICILLIN RESISTANT STAPHYLOCOCCUS AUREUS (A)  Final      Susceptibility   Methicillin resistant staphylococcus aureus - MIC*    CIPROFLOXACIN >=8 RESISTANT Resistant     GENTAMICIN <=0.5 SENSITIVE Sensitive     NITROFURANTOIN <=16 SENSITIVE Sensitive     OXACILLIN >=4 RESISTANT Resistant     TETRACYCLINE <=1 SENSITIVE Sensitive     VANCOMYCIN <=0.5 SENSITIVE Sensitive     TRIMETH/SULFA <=10 SENSITIVE Sensitive     CLINDAMYCIN RESISTANT Resistant     RIFAMPIN <=0.5 SENSITIVE Sensitive     Inducible Clindamycin POSITIVE Resistant     * >=100,000 COLONIES/mL METHICILLIN RESISTANT STAPHYLOCOCCUS AUREUS  Surgical PCR screen     Status: Abnormal   Collection Time: 10/30/16  4:30 PM  Result Value Ref Range Status   MRSA, PCR POSITIVE (A) NEGATIVE Final    Comment: RESULT CALLED TO, READ BACK BY AND VERIFIED WITH: A. PRIDDY,RN 1910 10/30/2016 T. TYSOR    Staphylococcus aureus POSITIVE (A) NEGATIVE Final    Comment: RESULT CALLED TO, READ BACK BY AND VERIFIED WITH: A. PRIDDY,RN 1910 10/30/2016 T. TYSOR (NOTE) The Xpert SA Assay (FDA approved for NASAL specimens in patients 21 years of age and older), is one component of a comprehensive surveillance program. It is not intended to diagnose infection nor to guide or monitor treatment.   Urine Culture     Status: Abnormal   Collection Time: 10/31/16 11:19 AM  Result Value Ref Range Status   Specimen Description URINE, CATHETERIZED  Final   Special Requests NONE  Final   Culture (A)  Final    >=100,000 COLONIES/mL METHICILLIN RESISTANT STAPHYLOCOCCUS AUREUS   Report Status 11/02/2016 FINAL  Final   Organism ID, Bacteria METHICILLIN RESISTANT STAPHYLOCOCCUS AUREUS (A)  Final      Susceptibility   Methicillin resistant staphylococcus aureus - MIC*    CIPROFLOXACIN >=8 RESISTANT Resistant      GENTAMICIN <=0.5 SENSITIVE Sensitive     NITROFURANTOIN <=16 SENSITIVE Sensitive     OXACILLIN >=4 RESISTANT Resistant     TETRACYCLINE <=1 SENSITIVE Sensitive  VANCOMYCIN <=0.5 SENSITIVE Sensitive     TRIMETH/SULFA <=10 SENSITIVE Sensitive     CLINDAMYCIN RESISTANT Resistant     RIFAMPIN <=0.5 SENSITIVE Sensitive     Inducible Clindamycin POSITIVE Resistant     * >=100,000 COLONIES/mL METHICILLIN RESISTANT STAPHYLOCOCCUS AUREUS  Culture, blood (routine x 2)     Status: None (Preliminary result)   Collection Time: 10/31/16  3:23 PM  Result Value Ref Range Status   Specimen Description BLOOD LEFT HAND  Final   Special Requests IN PEDIATRIC BOTTLE Blood Culture adequate volume  Final   Culture NO GROWTH 2 DAYS  Final   Report Status PENDING  Incomplete  Culture, blood (routine x 2)     Status: None (Preliminary result)   Collection Time: 10/31/16  3:28 PM  Result Value Ref Range Status   Specimen Description BLOOD LEFT HAND  Final   Special Requests IN PEDIATRIC BOTTLE Blood Culture adequate volume  Final   Culture NO GROWTH 2 DAYS  Final   Report Status PENDING  Incomplete         Radiology Studies: Dg Chest Port 1 View  Result Date: 11/02/2016 CLINICAL DATA:  Hypoxia. EXAM: PORTABLE CHEST 1 VIEW COMPARISON:  Chest x-ray from yesterday. FINDINGS: Stable cardiomegaly. Mild pulmonary vascular congestion. Unchanged small to moderate bilateral pleural effusions. No pneumothorax. IMPRESSION: Stable cardiomegaly and small to moderate bilateral pleural effusions. Electronically Signed   By: Titus Dubin M.D.   On: 11/02/2016 09:26   Dg Chest Port 1 View  Result Date: 11/01/2016 CLINICAL DATA:  Hypoxia EXAM: PORTABLE CHEST 1 VIEW COMPARISON:  10/28/2014 FINDINGS: New small to moderate bilateral pleural effusions with central vascular congestion and pulmonary vascular redistribution consistent with CHF. Stable cardiomegaly with aortic atherosclerosis. Calcified nodule the right  upper lobe. Old T8 and T11 compression fractures. IMPRESSION: 1. Mild CHF with small to moderate bilateral pleural effusions. 2. Chronic stable T8 and T11 compression fractures of the thoracic spine. 3. Granuloma in the right upper lobe, stable appearance Electronically Signed   By: Ashley Royalty M.D.   On: 11/01/2016 20:47        Scheduled Meds: . acetaminophen  1,000 mg Oral Q8H  . chlorhexidine  60 mL Topical Once  . cholecalciferol  2,000 Units Oral Daily  . diltiazem  30 mg Oral Q8H  . escitalopram  5 mg Oral Daily  . feeding supplement (ENSURE ENLIVE)  237 mL Oral BID BM  . levothyroxine  25 mcg Oral QAC breakfast  . memantine  28 mg Oral Daily  . mupirocin ointment  1 application Nasal BID  . nebivolol  10 mg Oral Daily  . polyethylene glycol  17 g Oral Daily  . polyvinyl alcohol  1 drop Both Eyes QID  . povidone-iodine  2 application Topical Once  . warfarin  2 mg Oral ONCE-1800  . Warfarin - Pharmacist Dosing Inpatient   Does not apply q1800   Continuous Infusions: . sodium chloride 50 mL/hr at 10/31/16 1335  . sodium chloride 50 mL/hr at 11/01/16 0600     LOS: 6 days    Time spent: 25 minutes    Fayrene Helper, MD [facility] [phone]  If 7PM-7AM, please contact night-coverage [email or web address] Password [email or web address] 11/02/2016, 1:59 PM

## 2016-11-02 NOTE — Progress Notes (Signed)
Patient ID: Patricia Nelson, female   DOB: [birth year], 83 y.o.   MRN: [id number] Subjective: 2 Days Post-Op Procedure(s) (LRB): ARTHROPLASTY  HIP (HEMIARTHROPLASTY) (Left)    Patient with dementia.  No acute post-operative events.  Discharge planning underway  Objective:   VITALS:   Vitals:   11/01/16 1314 11/01/16 1640  BP: 128/72 125/64  Pulse: 90 96  Resp:  16  Temp:  98.2 F (36.8 C)  SpO2: 96% 98%    Neurovascular intact Incision: dressing C/D/I  LABS  Recent Labs  10/31/16 0236 11/01/16 0353 11/02/16 0445  HGB 10.4* 10.0* 9.5*  HCT 32.9* 31.2* 29.8*  WBC 5.5 6.0 7.4  PLT 158 179 163     Recent Labs  10/31/16 0236 11/01/16 0353 11/02/16 0445  NA 140 140 139  K 3.7 3.8 3.9  BUN 27* 32* 24*  CREATININE 0.90 0.90 0.80  GLUCOSE 108* 149* 110*     Recent Labs  11/01/16 0353 11/02/16 0445  INR 1.33 1.47     Assessment/Plan: 2 Days Post-Op Procedure(s) (LRB): ARTHROPLASTY  HIP (HEMIARTHROPLASTY) (Left)   Up with therapy Discharge to SNF most likely when medically stable RTC in 2 weeks WBAT LLE

## 2016-11-02 NOTE — Progress Notes (Signed)
[place] for warfarin Indication: atrial fibrillation  Allergies  Allergen Reactions  . Ace Inhibitors Cough  . Mirtazapine Other (See Comments)    Nightmares   . Penicillins Rash    Has patient had a PCN reaction causing immediate rash, facial/tongue/throat swelling, SOB or lightheadedness with hypotension: Yes Has patient had a PCN reaction causing severe rash involving mucus membranes or skin necrosis: Unknown Has patient had a PCN reaction that required hospitalization: Unknown Has patient had a PCN reaction occurring within the last 10 years: Unknown If all of the above answers are "NO", then may proceed with Cephalosporin use.     Labs:  Recent Labs  10/31/16 0236 11/01/16 0353 11/02/16 0445  HGB 10.4* 10.0* 9.5*  HCT 32.9* 31.2* 29.8*  PLT 158 179 163  LABPROT 16.2* 16.4* 17.7*  INR 1.31 1.33 1.47  CREATININE 0.90 0.90 0.80    Estimated Creatinine Clearance: 27.5 mL/min (by C-G formula based on SCr of 0.8 mg/dL).   Assessment: 83 year old female on warfarin for history of atrial fibrillation. Patient presented s/p fall this admission and Coumadin was reversed with Vitamin K for surgery. Patient is now s/p L-hemiarthroplasty and was resumed on warfarin on 9/9.  INR today is 1.47 PTA dose was 2 mg daily (this has just been reduced due to elevated INR on 10/13/16).   Goal of Therapy:  INR 2-3 Monitor platelets by anticoagulation protocol: Yes   Plan:  Coumadin 2 mg po x1 Daily INR    Hughes Better, PharmD, BCPS Clinical Pharmacist 11/02/2016 9:36 AM

## 2016-11-02 NOTE — Social Work (Signed)
CSW obtained [phone] for SNF placement.  CSW will f/u with discharge back to [facility].  Elissa Hefty, LCSW Clinical Social Worker [phone]

## 2016-11-03 LAB — PROTIME-INR
INR: 1.38
Prothrombin Time: 16.9 seconds — ABNORMAL HIGH (ref 11.4–15.2)

## 2016-11-03 LAB — CBC
HCT: 32.6 % — ABNORMAL LOW (ref 36.0–46.0)
Hemoglobin: 10.3 g/dL — ABNORMAL LOW (ref 12.0–15.0)
MCH: 30 pg (ref 26.0–34.0)
MCHC: 31.6 g/dL (ref 30.0–36.0)
MCV: 95 fL (ref 78.0–100.0)
PLATELETS: 197 10*3/uL (ref 150–400)
RBC: 3.43 MIL/uL — ABNORMAL LOW (ref 3.87–5.11)
RDW: 14.5 % (ref 11.5–15.5)
WBC: 8.4 10*3/uL (ref 4.0–10.5)

## 2016-11-03 MED ORDER — ACETAMINOPHEN 325 MG PO TABS: 650.0000 mg | ORAL_TABLET | Freq: Four times a day (QID) | ORAL | 0 refills | Status: DC | PRN

## 2016-11-03 MED ORDER — WARFARIN SODIUM 3 MG PO TABS
3.0000 mg | ORAL_TABLET | Freq: Once | ORAL | Status: AC
  Administered 2016-11-03: 3 mg via ORAL
  Filled 2016-11-03: qty 1

## 2016-11-03 MED ORDER — CHOLECALCIFEROL 50 MCG (2000 UT) PO TABS: 2000.0000 [IU] | ORAL_TABLET | Freq: Every day | ORAL | 0 refills | Status: DC

## 2016-11-03 MED ORDER — ONDANSETRON HCL 4 MG PO TABS: 4.0000 mg | ORAL_TABLET | Freq: Four times a day (QID) | ORAL | 0 refills | Status: DC | PRN

## 2016-11-03 MED ORDER — CHLORHEXIDINE GLUCONATE 4 % EX LIQD: 60.0000 mL | Freq: Once | CUTANEOUS | 0 refills | Status: AC

## 2016-11-03 MED ORDER — METOCLOPRAMIDE HCL 5 MG PO TABS: 5.0000 mg | ORAL_TABLET | Freq: Three times a day (TID) | ORAL | 0 refills | Status: DC | PRN

## 2016-11-03 MED ORDER — ALUM & MAG HYDROXIDE-SIMETH 200-200-20 MG/5ML PO SUSP: 30.0000 mL | ORAL | 0 refills | Status: DC | PRN

## 2016-11-03 NOTE — Progress Notes (Signed)
  Speech Language Pathology Treatment: Dysphagia  Patient Details Name: JAQUALA FULLER MRN: [id number] DOB: 1915/12/12 Today's Date: 11/03/2016 Time: 9191-6606 SLP Time Calculation (min) (ACUTE ONLY): 12 min  Assessment / Plan / Recommendation Clinical Impression  Pt seen with am meal, appears to be eating very little. Able to encourage pt with verbal and tactile cues to accept two bites of potato, two bites of egg and one bite of sausage. Texture is not ground, but small bites provided and she managed this with slow but functional mastication with no oral pocketing as long as liquid wash was given. Recommend pt continue current diet as she is tolerating well, but still quite weak and not back to baseline. Will sign off, SLP at SNF to further manage diet as pt improves.   HPI HPI: Pt is a 83 y.o.femalewith medical history significant of anemia, chronic kidney disease, depression, dementia, HTN, HLD, macular degeneration, osteoporosis, atrial fibrillation chronic anticoagulant who presented to the ED with fall resulting in severe pain of her left hip and upper thigh. Pt plans to undergo surgery to repair left femoral neck fx, potentially on 9/9.  On 10/27/16, pt was unable to swallow (unable to perform task) when directed to drink water via straw.       SLP Plan  All goals met       Recommendations  Diet recommendations: Dysphagia 2 (fine chop);Thin liquid Liquids provided via: Straw;Cup Medication Administration: Whole meds with liquid Supervision: Full supervision/cueing for compensatory strategies Compensations: Slow rate;Small sips/bites Postural Changes and/or Swallow Maneuvers: Seated upright 90 degrees                Oral Care Recommendations: Oral care BID Follow up Recommendations: Skilled Nursing facility SLP Visit Diagnosis: Dysphagia, unspecified (R13.10) Plan: All goals met       GO               Herbie Baltimore, MA CCC-SLP [phone]  Lynann Beaver 11/03/2016, 9:40 AM

## 2016-11-03 NOTE — Clinical Social Work Placement (Signed)
   CLINICAL SOCIAL WORK PLACEMENT  NOTE  Date:  11/03/2016  Patient Details  Name: Patricia Nelson MRN: [id number] Date of Birth: 12/21/1915  Clinical Social Work is seeking post-discharge placement for this patient at the [place] level of care (*CSW will initial, date and re-position this form in  chart as items are completed):  Yes   Patient/family provided with [facility]'s list of facilities offering this level of care within the geographic area requested by the patient (or if unable, by the patient's family).  Yes   Patient/family informed of their freedom to choose among providers that offer the needed level of care, that participate in Medicare, Medicaid or managed care program needed by the patient, have an available bed and are willing to accept the patient.  Yes   Patient/family informed of [facility]'s ownership interest in [facility] and [facility], as well as of the fact that they are under no obligation to receive care at these facilities.  PASRR submitted to EDS on       PASRR number received on       Existing PASRR number confirmed on 11/01/16     FL2 transmitted to all facilities in geographic area requested by pt/family on       FL2 transmitted to all facilities within larger geographic area on 11/01/16     Patient informed that his/her managed care company has contracts with or will negotiate with certain facilities, including the following:        Yes   Patient/family informed of bed offers received.  Patient chooses bed at [facility]     Physician recommends and patient chooses bed at      Patient to be transferred to [facility] on 11/03/16.  Patient to be transferred to facility by [facility]     Patient family notified on 11/03/16 of transfer.  Name of family member notified:  daughter at bedside     PHYSICIAN Please prepare priority discharge summary, including  medications, Please sign FL2, Please prepare prescriptions, Please sign DNR     Additional Comment:    _______________________________________________ [name], LCSW 11/03/2016, 3:19 PM

## 2016-11-03 NOTE — Discharge Summary (Signed)
Physician Discharge Summary  DEASHA CLENDENIN OEV:[id number] DOB: 1915-04-27 DOA: 10/27/2016  PCP: Blanchie Serve, MD  Admit date: 10/27/2016 Discharge date: 11/03/2016  Admitted FrOM Disposition:    Recommendations for Outpatient Follow-up:  1. Follow up with PCP in 1-2 weeks 2. Please obtain BMP/CBC in one week   Home [facility] none Equipment/Devices: None  Discharge Condition: Stable CODE STATUS: DO NOT RESUSCITATE  Diet recommendation: Heart healthy  Brief/Interim Summary: 83 year old female status post fall and fracture of the left femoral neck status post left hemiarthroplasty. Patient agreed to be discharged to skilled nursing facility. Patient received a dose of Lasix for pulmonary edema. Patient has a history of CK D hypertension depression hyperlipidemia macular degeneration osteoporosis anemia atrial fibrillation on Coumadin.   Discharge Diagnoses:  Active Problems:   HTN (hypertension)   Hypercholesteremia   CKD (chronic kidney disease) stage 3, GFR 30-59 ml/min   Hypothyroidism   A-fib (HCC)   Congestive heart failure (CHF) ([place])   Fall   Bradycardia   Closed left femoral fracture (HCC)   Hip fracture [facility])    Discharge Instructions  Discharge Instructions    Weight bearing as tolerated    Complete by:  As directed    Laterality:  left   Extremity:  Lower     Allergies as of 11/03/2016      Reactions   Ace Inhibitors Cough   Mirtazapine Other (See Comments)   Nightmares   Penicillins Rash   Has patient had a PCN reaction causing immediate rash, facial/tongue/throat swelling, SOB or lightheadedness with hypotension: Yes Has patient had a PCN reaction causing severe rash involving mucus membranes or skin necrosis: Unknown Has patient had a PCN reaction that required hospitalization: Unknown Has patient had a PCN reaction occurring within the last 10 years: Unknown If all of the above answers are "NO", then may proceed with Cephalosporin use.       Medication List    STOP taking these medications   loperamide 2 MG tablet Commonly known as:  IMODIUM A-D     TAKE these medications   acetaminophen 325 MG tablet Commonly known as:  TYLENOL Take 650 mg by mouth every 6 (six) hours as needed for mild pain. What changed:  Another medication with the same name was changed. Make sure you understand how and when to take each.   acetaminophen 325 MG tablet Commonly known as:  TYLENOL Take 2 tablets (650 mg total) by mouth every 6 (six) hours as needed for mild pain (or Fever >/= 101). What changed:  medication strength  how much to take  when to take this  reasons to take this   alum & mag hydroxide-simeth 829-937-16 MG/5ML suspension Commonly known as:  MAALOX/MYLANTA Take 30 mLs by mouth every 4 (four) hours as needed for indigestion.   ASPERCREME LIDOCAINE 4 % Ptch Generic drug:  Lidocaine Apply 1 patch topically at bedtime. APPLY TO LOWER BACK AND REMOVE IN THE MORNING   aspirin 81 MG tablet Take 81 mg by mouth daily.   BENECALORIE PO Take 1.5 oz by mouth 2 (two) times daily.   BYSTOLIC 10 MG tablet Generic drug:  nebivolol Take 10 mg by mouth daily.   chlorhexidine 4 % external liquid Commonly known as:  HIBICLENS Apply 60 mLs (4 application total) topically once.   Cholecalciferol 2000 units Tabs Take 1 tablet (2,000 Units total) by mouth daily.   clindamycin 150 MG capsule Commonly known as:  CLEOCIN Take 600 mg by mouth See  admin instructions. ONE HOUR PRIOR TO DENTAL APPOINTMENTS   diltiazem 30 MG tablet Commonly known as:  CARDIZEM Take 30 mg by mouth 4 (four) times daily.   escitalopram 5 MG tablet Commonly known as:  LEXAPRO Take 5 mg by mouth daily.   furosemide 20 MG tablet Commonly known as:  LASIX Take 10 mg by mouth daily as needed for edema.   hydrocortisone cream 1 % Apply 1 application topically 2 (two) times daily. TO HEMORRHOIDS   levothyroxine 25 MCG tablet Commonly known as:   SYNTHROID, LEVOTHROID Take 25 mcg by mouth daily before breakfast.   metoCLOPramide 5 MG tablet Commonly known as:  REGLAN Take 1-2 tablets (5-10 mg total) by mouth every 8 (eight) hours as needed for nausea (if ondansetron (ZOFRAN) ineffective.).   NAMENDA XR 28 MG Cp24 24 hr capsule Generic drug:  memantine Take 28 mg by mouth.   ondansetron 4 MG tablet Commonly known as:  ZOFRAN Take 1 tablet (4 mg total) by mouth every 6 (six) hours as needed for nausea.   polyethylene glycol packet Commonly known as:  MIRALAX / GLYCOLAX Take 17 g by mouth every other day. For constipation and hold for loose stools   polyvinyl alcohol 1.4 % ophthalmic solution Commonly known as:  LIQUIFILM TEARS Place 1 drop into both eyes 4 (four) times daily.   potassium chloride 10 MEQ tablet Commonly known as:  K-DUR,KLOR-CON Take 10 mEq by mouth daily as needed (for edema/if taking edema).   traMADol 50 MG tablet Commonly known as:  ULTRAM Take 1-2 tablets (50-100 mg total) by mouth every 6 (six) hours as needed.   warfarin 2 MG tablet Commonly known as:  COUMADIN Take 2 mg by mouth daily.            Discharge Care Instructions        Start     Ordered   11/03/16 0000  acetaminophen (TYLENOL) 325 MG tablet  Every 6 hours PRN     11/03/16 0925   11/03/16 0000  alum & mag hydroxide-simeth (MAALOX/MYLANTA) 200-200-20 MG/5ML suspension  Every 4 hours PRN     11/03/16 0925   11/03/16 0000  chlorhexidine (HIBICLENS) 4 % external liquid   Once     11/03/16 0925   11/03/16 0000  cholecalciferol 2000 units TABS  Daily     11/03/16 0925   11/03/16 0000  metoCLOPramide (REGLAN) 5 MG tablet  Every 8 hours PRN     11/03/16 0925   11/03/16 0000  ondansetron (ZOFRAN) 4 MG tablet  Every 6 hours PRN     11/03/16 0925   10/31/16 0000  traMADol (ULTRAM) 50 MG tablet  Every 6 hours PRN    Question:  Supervising Provider  Answer:  Paralee Cancel   10/31/16 1143   10/31/16 0000  Weight bearing as  tolerated    Question Answer Comment  Laterality left   Extremity Lower      10/31/16 1205      Contact information for follow-up providers    Blanchie Serve, MD Follow up.   Specialty:  Internal Medicine Contact information: [place] [address] [phone]            Contact information for after-discharge care    Destination    [facility] Follow up.   Specialty:  [place] information: [place] [phone]  Allergies  Allergen Reactions  . Ace Inhibitors Cough  . Mirtazapine Other (See Comments)    Nightmares   . Penicillins Rash    Has patient had a PCN reaction causing immediate rash, facial/tongue/throat swelling, SOB or lightheadedness with hypotension: Yes Has patient had a PCN reaction causing severe rash involving mucus membranes or skin necrosis: Unknown Has patient had a PCN reaction that required hospitalization: Unknown Has patient had a PCN reaction occurring within the last 10 years: Unknown If all of the above answers are "NO", then may proceed with Cephalosporin use.     Consultations:ORTHO   Procedures/Studies: Dg Chest 1 View  Result Date: 10/27/2016 CLINICAL DATA:  Fall from standing.  LEFT hip pain. EXAM: CHEST 1 VIEW COMPARISON:  Chest radiograph February 20, 2009 FINDINGS: Cardiac silhouette is moderately enlarged. Calcified aortic knob. Similar chronic interstitial changes. Hyperinflation. Calcified granulomas. No pleural effusion or focal consolidation. Apical pleural thickening is similar. No pneumothorax. Osteopenia. Old T8 and T11 compression fractures. IMPRESSION: Stable cardiomegaly and COPD. Aortic Atherosclerosis (ICD10-I70.0). Electronically Signed   By: Elon Alas M.D.   On: 10/27/2016 18:14   Ct Head Wo Contrast  Result Date: 10/27/2016 CLINICAL DATA:  Fall from standing position with left hip  fracture EXAM: CT HEAD WITHOUT CONTRAST CT CERVICAL SPINE WITHOUT CONTRAST TECHNIQUE: Multidetector CT imaging of the head and cervical spine was performed following the standard protocol without intravenous contrast. Multiplanar CT image reconstructions of the cervical spine were also generated. COMPARISON:  11/10/2013 FINDINGS: CT HEAD FINDINGS Brain: No acute territorial infarction, hemorrhage, or intracranial mass is seen. Marked atrophy. Mild small vessel ischemic changes of the white matter. Stable small left sub insula hypodensities. Stable enlarged ventricles, felt secondary to atrophy. Vascular: No hyperdense vessels. Carotid artery calcifications. Vertebral artery calcification. Skull: No fracture Sinuses/Orbits: Mild mucosal thickening within the ethmoid sinuses. No acute orbital abnormality. Other: None CT CERVICAL SPINE FINDINGS Alignment: Trace retrolisthesis of C5 on C6, unchanged. Trace anterolisthesis C3 and C4 unchanged. Facet alignment within normal limits. Skull base and vertebrae: No acute fracture. No primary bone lesion or focal pathologic process. Soft tissues and spinal canal: No prevertebral fluid or swelling. No visible canal hematoma. Disc levels: Moderate severe degenerative changes at C4-C5, C5-C6 and C6-C7. Bilateral foraminal stenosis C4 through C7, most notable at C5 C6. Upper chest: Apical scarring.  Carotid artery calcification. Other: None IMPRESSION: 1. No CT evidence for acute intracranial abnormality. Atrophy and small vessel ischemic changes of the white matter 2. Stable cervical spine alignment with degenerative changes. No acute osseous abnormality Electronically Signed   By: Donavan Foil M.D.   On: 10/27/2016 18:54   Ct Cervical Spine Wo Contrast  Result Date: 10/27/2016 CLINICAL DATA:  Fall from standing position with left hip fracture EXAM: CT HEAD WITHOUT CONTRAST CT CERVICAL SPINE WITHOUT CONTRAST TECHNIQUE: Multidetector CT imaging of the head and cervical spine  was performed following the standard protocol without intravenous contrast. Multiplanar CT image reconstructions of the cervical spine were also generated. COMPARISON:  11/10/2013 FINDINGS: CT HEAD FINDINGS Brain: No acute territorial infarction, hemorrhage, or intracranial mass is seen. Marked atrophy. Mild small vessel ischemic changes of the white matter. Stable small left sub insula hypodensities. Stable enlarged ventricles, felt secondary to atrophy. Vascular: No hyperdense vessels. Carotid artery calcifications. Vertebral artery calcification. Skull: No fracture Sinuses/Orbits: Mild mucosal thickening within the ethmoid sinuses. No acute orbital abnormality. Other: None CT CERVICAL SPINE FINDINGS Alignment: Trace retrolisthesis of C5 on C6, unchanged. Trace anterolisthesis C3 and  C4 unchanged. Facet alignment within normal limits. Skull base and vertebrae: No acute fracture. No primary bone lesion or focal pathologic process. Soft tissues and spinal canal: No prevertebral fluid or swelling. No visible canal hematoma. Disc levels: Moderate severe degenerative changes at C4-C5, C5-C6 and C6-C7. Bilateral foraminal stenosis C4 through C7, most notable at C5 C6. Upper chest: Apical scarring.  Carotid artery calcification. Other: None IMPRESSION: 1. No CT evidence for acute intracranial abnormality. Atrophy and small vessel ischemic changes of the white matter 2. Stable cervical spine alignment with degenerative changes. No acute osseous abnormality Electronically Signed   By: Donavan Foil M.D.   On: 10/27/2016 18:54   Pelvis Portable  Result Date: 10/31/2016 CLINICAL DATA:  Arthroplasty left hip. EXAM: PORTABLE PELVIS 1-2 VIEWS COMPARISON:  Plain film of the pelvis dated 08/25/2009. FINDINGS: Status post left hip arthroplasty. Hardware appears intact and appropriately position. Right hip arthroplasty is stable in position. Overall osseous alignment appears anatomic. Expected postsurgical changes within the  soft tissues about the left hip. IMPRESSION: Status post left hip arthroplasty. Hardware appears intact and appropriately positioned. No evidence of surgical complicating feature. Electronically Signed   By: Franki Cabot M.D.   On: 10/31/2016 12:08   Dg Chest Port 1 View  Result Date: 11/02/2016 CLINICAL DATA:  Hypoxia. EXAM: PORTABLE CHEST 1 VIEW COMPARISON:  Chest x-ray from yesterday. FINDINGS: Stable cardiomegaly. Mild pulmonary vascular congestion. Unchanged small to moderate bilateral pleural effusions. No pneumothorax. IMPRESSION: Stable cardiomegaly and small to moderate bilateral pleural effusions. Electronically Signed   By: Titus Dubin M.D.   On: 11/02/2016 09:26   Dg Chest Port 1 View  Result Date: 11/01/2016 CLINICAL DATA:  Hypoxia EXAM: PORTABLE CHEST 1 VIEW COMPARISON:  10/28/2014 FINDINGS: New small to moderate bilateral pleural effusions with central vascular congestion and pulmonary vascular redistribution consistent with CHF. Stable cardiomegaly with aortic atherosclerosis. Calcified nodule the right upper lobe. Old T8 and T11 compression fractures. IMPRESSION: 1. Mild CHF with small to moderate bilateral pleural effusions. 2. Chronic stable T8 and T11 compression fractures of the thoracic spine. 3. Granuloma in the right upper lobe, stable appearance Electronically Signed   By: Ashley Royalty M.D.   On: 11/01/2016 20:47   Dg Hip Unilat With Pelvis 2-3 Views Left  Result Date: 10/27/2016 CLINICAL DATA:  Fall EXAM: DG HIP (WITH OR WITHOUT PELVIS) 2-3V LEFT COMPARISON:  None. FINDINGS: Fracture left femoral neck with mild displacement. Normal left hip joint Chronic fracture right pubic rami. Right hip hemiarthroplasty. Lumbar disc degeneration IMPRESSION: Displaced fracture left femoral neck Electronically Signed   By: Franchot Gallo M.D.   On: 10/27/2016 18:13    (Echo, Carotid, EGD, Colonoscopy, ERCP)    Subjective:   Discharge Exam: Vitals:   11/02/16 2000 11/03/16 0606   BP: 121/64 139/65  Pulse: 79 85  Resp: 18 12  Temp: 97.9 F (36.6 C) 98.8 F (37.1 C)  SpO2: 97% 94%   Vitals:   11/02/16 1512 11/02/16 1821 11/02/16 2000 11/03/16 0606  BP:   121/64 139/65  Pulse:   79 85  Resp:   18 12  Temp:   97.9 F (36.6 C) 98.8 F (37.1 C)  TempSrc:   Axillary Oral  SpO2: 92% 93% 97% 94%  Weight:    61 kg (134 lb 7.7 oz)    General: Pt is alert, awake, not in acute distress Cardiovascular: RRR, S1/S2 +, no rubs, no gallops Respiratory: CTA bilaterally, no wheezing, no rhonchi Abdominal: Soft, NT, ND,  bowel sounds + Extremities: no edema, no cyanosis    The results of significant diagnostics from this hospitalization (including imaging, microbiology, ancillary and laboratory) are listed below for reference.     Microbiology: Recent Results (from the past 240 hour(s))  Culture, Urine     Status: Abnormal   Collection Time: 10/28/16  6:50 AM  Result Value Ref Range Status   Specimen Description URINE, RANDOM  Final   Special Requests NONE  Final   Culture (A)  Final    >=100,000 COLONIES/mL METHICILLIN RESISTANT STAPHYLOCOCCUS AUREUS   Report Status 10/31/2016 FINAL  Final   Organism ID, Bacteria METHICILLIN RESISTANT STAPHYLOCOCCUS AUREUS (A)  Final      Susceptibility   Methicillin resistant staphylococcus aureus - MIC*    CIPROFLOXACIN >=8 RESISTANT Resistant     GENTAMICIN <=0.5 SENSITIVE Sensitive     NITROFURANTOIN <=16 SENSITIVE Sensitive     OXACILLIN >=4 RESISTANT Resistant     TETRACYCLINE <=1 SENSITIVE Sensitive     VANCOMYCIN <=0.5 SENSITIVE Sensitive     TRIMETH/SULFA <=10 SENSITIVE Sensitive     CLINDAMYCIN RESISTANT Resistant     RIFAMPIN <=0.5 SENSITIVE Sensitive     Inducible Clindamycin POSITIVE Resistant     * >=100,000 COLONIES/mL METHICILLIN RESISTANT STAPHYLOCOCCUS AUREUS  Surgical PCR screen     Status: Abnormal   Collection Time: 10/30/16  4:30 PM  Result Value Ref Range Status   MRSA, PCR POSITIVE (A) NEGATIVE  Final    Comment: RESULT CALLED TO, READ BACK BY AND VERIFIED WITH: A. PRIDDY,RN 1910 10/30/2016 T. TYSOR    Staphylococcus aureus POSITIVE (A) NEGATIVE Final    Comment: RESULT CALLED TO, READ BACK BY AND VERIFIED WITH: A. PRIDDY,RN 1910 10/30/2016 T. TYSOR (NOTE) The Xpert SA Assay (FDA approved for NASAL specimens in patients 73 years of age and older), is one component of a comprehensive surveillance program. It is not intended to diagnose infection nor to guide or monitor treatment.   Urine Culture     Status: Abnormal   Collection Time: 10/31/16 11:19 AM  Result Value Ref Range Status   Specimen Description URINE, CATHETERIZED  Final   Special Requests NONE  Final   Culture (A)  Final    >=100,000 COLONIES/mL METHICILLIN RESISTANT STAPHYLOCOCCUS AUREUS   Report Status 11/02/2016 FINAL  Final   Organism ID, Bacteria METHICILLIN RESISTANT STAPHYLOCOCCUS AUREUS (A)  Final      Susceptibility   Methicillin resistant staphylococcus aureus - MIC*    CIPROFLOXACIN >=8 RESISTANT Resistant     GENTAMICIN <=0.5 SENSITIVE Sensitive     NITROFURANTOIN <=16 SENSITIVE Sensitive     OXACILLIN >=4 RESISTANT Resistant     TETRACYCLINE <=1 SENSITIVE Sensitive     VANCOMYCIN <=0.5 SENSITIVE Sensitive     TRIMETH/SULFA <=10 SENSITIVE Sensitive     CLINDAMYCIN RESISTANT Resistant     RIFAMPIN <=0.5 SENSITIVE Sensitive     Inducible Clindamycin POSITIVE Resistant     * >=100,000 COLONIES/mL METHICILLIN RESISTANT STAPHYLOCOCCUS AUREUS  Culture, blood (routine x 2)     Status: None (Preliminary result)   Collection Time: 10/31/16  3:23 PM  Result Value Ref Range Status   Specimen Description BLOOD LEFT HAND  Final   Special Requests IN PEDIATRIC BOTTLE Blood Culture adequate volume  Final   Culture NO GROWTH 3 DAYS  Final   Report Status PENDING  Incomplete  Culture, blood (routine x 2)     Status: None (Preliminary result)   Collection Time: 10/31/16  3:28  PM  Result Value Ref Range Status    Specimen Description BLOOD LEFT HAND  Final   Special Requests IN PEDIATRIC BOTTLE Blood Culture adequate volume  Final   Culture NO GROWTH 3 DAYS  Final   Report Status PENDING  Incomplete     Labs: BNP (last 3 results) No results for input(s): BNP in the last 8760 hours. Basic Metabolic Panel:  Recent Labs Lab 10/29/16 1203 10/30/16 0252 10/31/16 0236 11/01/16 0353 11/02/16 0445  NA 141 141 140 140 139  K 3.8 3.9 3.7 3.8 3.9  CL 114* 113* 112* 113* 112*  CO2 20* 22 21* 21* 20*  GLUCOSE 165* 113* 108* 149* 110*  BUN 30* 30* 27* 32* 24*  CREATININE 0.97 1.01* 0.90 0.90 0.80  CALCIUM 8.5* 8.7* 8.2* 8.3* 8.3*   Liver Function Tests:  Recent Labs Lab 10/28/16 0329  ALBUMIN 3.0*   No results for input(s): LIPASE, AMYLASE in the last 168 hours. No results for input(s): AMMONIA in the last 168 hours. CBC:  Recent Labs Lab 10/27/16 1715  10/30/16 0252 10/31/16 0236 11/01/16 0353 11/02/16 0445 11/03/16 0318  WBC 9.6  < > 6.6 5.5 6.0 7.4 8.4  NEUTROABS 8.4*  --   --   --   --   --   --   HGB 12.4  < > 10.7* 10.4* 10.0* 9.5* 10.3*  HCT 39.1  < > 34.0* 32.9* 31.2* 29.8* 32.6*  MCV 94.7  < > 95.2 95.1 94.8 96.1 95.0  PLT 174  < > 136* 158 179 163 197  < > = values in this interval not displayed. Cardiac Enzymes:  Recent Labs Lab 10/28/16 0329 10/28/16 1027 10/28/16 1455 10/28/16 2228 10/29/16 0330  TROPONINI <0.03 0.06* 0.09* 0.05* 0.04*   BNP: Invalid input(s): POCBNP CBG: No results for input(s): GLUCAP in the last 168 hours. D-Dimer No results for input(s): DDIMER in the last 72 hours. Hgb A1c No results for input(s): HGBA1C in the last 72 hours. Lipid Profile No results for input(s): CHOL, HDL, LDLCALC, TRIG, CHOLHDL, LDLDIRECT in the last 72 hours. Thyroid function studies No results for input(s): TSH, T4TOTAL, T3FREE, THYROIDAB in the last 72 hours.  Invalid input(s): FREET3 Anemia work up No results for input(s): VITAMINB12, FOLATE, FERRITIN,  TIBC, IRON, RETICCTPCT in the last 72 hours. Urinalysis    Component Value Date/Time   COLORURINE YELLOW 10/28/2016 0650   APPEARANCEUR CLEAR 10/28/2016 0650   LABSPEC 1.021 10/28/2016 0650   PHURINE 6.0 10/28/2016 0650   GLUCOSEU NEGATIVE 10/28/2016 0650   HGBUR NEGATIVE 10/28/2016 0650   BILIRUBINUR NEGATIVE 10/28/2016 0650   BILIRUBINUR neg 02/05/2012 1235   [place] 10/28/2016 0650   PROTEINUR 100 (A) 10/28/2016 0650   UROBILINOGEN 0.2 11/10/2013 0653   NITRITE POSITIVE (A) 10/28/2016 0650   LEUKOCYTESUR SMALL (A) 10/28/2016 0650   Sepsis Labs Invalid input(s): PROCALCITONIN,  WBC,  LACTICIDVEN Microbiology Recent Results (from the past 240 hour(s))  Culture, Urine     Status: Abnormal   Collection Time: 10/28/16  6:50 AM  Result Value Ref Range Status   Specimen Description URINE, RANDOM  Final   Special Requests NONE  Final   Culture (A)  Final    >=100,000 COLONIES/mL METHICILLIN RESISTANT STAPHYLOCOCCUS AUREUS   Report Status 10/31/2016 FINAL  Final   Organism ID, Bacteria METHICILLIN RESISTANT STAPHYLOCOCCUS AUREUS (A)  Final      Susceptibility   Methicillin resistant staphylococcus aureus - MIC*    CIPROFLOXACIN >=8 RESISTANT Resistant  GENTAMICIN <=0.5 SENSITIVE Sensitive     NITROFURANTOIN <=16 SENSITIVE Sensitive     OXACILLIN >=4 RESISTANT Resistant     TETRACYCLINE <=1 SENSITIVE Sensitive     VANCOMYCIN <=0.5 SENSITIVE Sensitive     TRIMETH/SULFA <=10 SENSITIVE Sensitive     CLINDAMYCIN RESISTANT Resistant     RIFAMPIN <=0.5 SENSITIVE Sensitive     Inducible Clindamycin POSITIVE Resistant     * >=100,000 COLONIES/mL METHICILLIN RESISTANT STAPHYLOCOCCUS AUREUS  Surgical PCR screen     Status: Abnormal   Collection Time: 10/30/16  4:30 PM  Result Value Ref Range Status   MRSA, PCR POSITIVE (A) NEGATIVE Final    Comment: RESULT CALLED TO, READ BACK BY AND VERIFIED WITH: A. PRIDDY,RN 1910 10/30/2016 T. TYSOR    Staphylococcus aureus POSITIVE  (A) NEGATIVE Final    Comment: RESULT CALLED TO, READ BACK BY AND VERIFIED WITH: A. PRIDDY,RN 1910 10/30/2016 T. TYSOR (NOTE) The Xpert SA Assay (FDA approved for NASAL specimens in patients 35 years of age and older), is one component of a comprehensive surveillance program. It is not intended to diagnose infection nor to guide or monitor treatment.   Urine Culture     Status: Abnormal   Collection Time: 10/31/16 11:19 AM  Result Value Ref Range Status   Specimen Description URINE, CATHETERIZED  Final   Special Requests NONE  Final   Culture (A)  Final    >=100,000 COLONIES/mL METHICILLIN RESISTANT STAPHYLOCOCCUS AUREUS   Report Status 11/02/2016 FINAL  Final   Organism ID, Bacteria METHICILLIN RESISTANT STAPHYLOCOCCUS AUREUS (A)  Final      Susceptibility   Methicillin resistant staphylococcus aureus - MIC*    CIPROFLOXACIN >=8 RESISTANT Resistant     GENTAMICIN <=0.5 SENSITIVE Sensitive     NITROFURANTOIN <=16 SENSITIVE Sensitive     OXACILLIN >=4 RESISTANT Resistant     TETRACYCLINE <=1 SENSITIVE Sensitive     VANCOMYCIN <=0.5 SENSITIVE Sensitive     TRIMETH/SULFA <=10 SENSITIVE Sensitive     CLINDAMYCIN RESISTANT Resistant     RIFAMPIN <=0.5 SENSITIVE Sensitive     Inducible Clindamycin POSITIVE Resistant     * >=100,000 COLONIES/mL METHICILLIN RESISTANT STAPHYLOCOCCUS AUREUS  Culture, blood (routine x 2)     Status: None (Preliminary result)   Collection Time: 10/31/16  3:23 PM  Result Value Ref Range Status   Specimen Description BLOOD LEFT HAND  Final   Special Requests IN PEDIATRIC BOTTLE Blood Culture adequate volume  Final   Culture NO GROWTH 3 DAYS  Final   Report Status PENDING  Incomplete  Culture, blood (routine x 2)     Status: None (Preliminary result)   Collection Time: 10/31/16  3:28 PM  Result Value Ref Range Status   Specimen Description BLOOD LEFT HAND  Final   Special Requests IN PEDIATRIC BOTTLE Blood Culture adequate volume  Final   Culture NO GROWTH  3 DAYS  Final   Report Status PENDING  Incomplete     Time coordinating discharge: Over 30 minutes  SIGNED:   Georgette Shell, MD  [facility] 11/03/2016, 3:09 PM  If 7PM-7AM, please contact night-coverage [email or web address] Password [email or web address]

## 2016-11-03 NOTE — Progress Notes (Signed)
OT [facility] contacting OT in regards to patients ability to return to ALF memory care unit. The facility reports that at [facility] +2 Level of care can be provided in the memory unit. The facility also reports that if the patient were to need additional assistance they do have a SNF level bed to transfer the patient to if needed.  OT recommending return to prior setting at [facility] memory care unit with SNF level care provided by facility  Phone contact  Jeri Modena   OTR/L Pager: [phone] Office: [phone] On behalf of Donah Driver .

## 2016-11-03 NOTE — Progress Notes (Signed)
Occupational Therapy Treatment Patient Details Name: Patricia Nelson MRN: [id number] DOB: March 21, 1915 Today's Date: 11/03/2016    History of present illness Patricia Fidler Gordonis a 83 y.o.femalewith medical history significant of anemia, chronic kidney disease, depression, HTN, HLD, macular degeneration, osteoporosis, atrial fibrillation chronic anticoagulant, RTHA. Presented with fall resulting in severe pain of her left hip and upper thigh. Sustained L femoral neck fx and underwent L hemiarthroplasty 10/31/16.   OT comments  Pt seen for OT tx session, continues to require MaxA +2 for squat pivot transfers to R side, requires MaxA for maintaining static sitting EOB as Pt demonstrates strong lateral lean to R side. Pt required total assist for peri-care after BM at bed level, requires [facility] assist for basic grooming ADLs. In anticipation of Pt discharge, contacted [facility] where Pt was previously residing to discuss Pt's current level of care and to ensure Pt's needs can be met at current venue, left message for facility CSW. Will continue to follow.    Follow Up Recommendations  SNF    Equipment Recommendations  Other (comment) (TBD in next venue )          Precautions / Restrictions Precautions Precautions: Fall;Posterior Hip Restrictions Weight Bearing Restrictions: No LLE Weight Bearing: Weight bearing as tolerated Other Position/Activity Restrictions: vision and hearing deficits       Mobility Bed Mobility Overal bed mobility: Needs Assistance Bed Mobility: Supine to Sit;Rolling Rolling: Mod assist;+2 for physical assistance   Supine to sit: Max assist;+2 for physical assistance;+2 for safety/equipment     General bed mobility comments: assist to advance LEs over EOB and to bring trunk into upright position; Pt requires MaxA to maintain static sitting EOB as Pt with strong lateral lean to R due to painful L hip   Transfers Overall transfer level: Needs  assistance Equipment used: 2 person hand held assist Transfers: Squat Pivot Transfers     Squat pivot transfers: Max assist;+2 physical assistance;+2 safety/equipment     General transfer comment: assist to rise and advance hips to chair while maintaining hip precautions     Balance Overall balance assessment: Needs assistance;History of Falls Sitting-balance support: Feet supported;Single extremity supported Sitting balance-Leahy Scale: Zero Sitting balance - Comments: Requires maxA to maintain static sitting balance EOB  Postural control: Right lateral lean Standing balance support: Bilateral upper extremity supported Standing balance-Leahy Scale: Zero                             ADL either performed or assessed with clinical judgement   ADL Overall ADL's : Needs assistance/impaired Eating/Feeding: Maximal assistance;Sitting;Set up Eating/Feeding Details (indicate cue type and reason): setup to cut up food and open containers; needs assist to attend to and complete task  Grooming: Wash/dry face;Sitting;Minimal assistance Grooming Details (indicate cue type and reason): Pt requires multimodal cues to complete task                      Toileting- Clothing Manipulation and Hygiene: Total assistance;+2 for physical assistance;Bed level;Adhering to hip precautions Toileting - Clothing Manipulation Details (indicate cue type and reason): total assist for peri-care after BM      Functional mobility during ADLs: Maximal assistance;+2 for physical assistance;+2 for safety/equipment General ADL Comments: Pt requires MaxA for static sitting EOB, continues to demonstrate strong lateral lean to R; MaxA+2 for squat pivot (to the right) to recliner from EOB     Vision  Additional Comments: vision impairments at baseline               Cognition Arousal/Alertness: Awake/alert;Lethargic Behavior During Therapy: WFL for tasks assessed/performed Overall Cognitive  Status: History of cognitive impairments - at baseline                                 General Comments: baseline dementia; Pt follows one step commands with multimodal cues and increased time/repetition; lethargic throughout but able to arouse, more alert upon sitting EOB                           Pertinent Vitals/ Pain       Pain Assessment: Faces Faces Pain Scale: Hurts little more Pain Location: LLE with movement  Pain Descriptors / Indicators: Grimacing;Sore;Discomfort Pain Intervention(s): Limited activity within patient's tolerance;Monitored during session;Repositioned                                                          Frequency  Min 2X/week        Progress Toward Goals  OT Goals(current goals can now be found in the care plan section)  Progress towards OT goals: OT to reassess next treatment  Acute Rehab OT Goals Patient Stated Goal: regain mobility; less pain  OT Goal Formulation: With patient Time For Goal Achievement: 11/15/16 Potential to Achieve Goals: Good  Plan Discharge plan remains appropriate                     AM-PAC PT "6 Clicks" Daily Activity     Outcome Measure   Help from another person eating meals?: A Little Help from another person taking care of personal grooming?: A Little Help from another person toileting, which includes using toliet, bedpan, or urinal?: Total Help from another person bathing (including washing, rinsing, drying)?: A Lot Help from another person to put on and taking off regular upper body clothing?: A Lot Help from another person to put on and taking off regular lower body clothing?: Total 6 Click Score: 12    End of Session Equipment Utilized During Treatment: Gait belt  OT Visit Diagnosis: Unsteadiness on feet (R26.81);Muscle weakness (generalized) (M62.81)   Activity Tolerance Patient tolerated treatment well   Patient Left in chair;with call  bell/phone within reach;with chair alarm set   Nurse Communication Mobility status        Time: 1610-9604 OT Time Calculation (min): 25 min  Charges: OT General Charges $OT Visit: 1 Visit OT Treatments $Self Care/Home Management : 8-22 mins  Lou Cal, OT Pager [phone] 11/03/2016    Patricia Band 11/03/2016, 11:10 AM

## 2016-11-03 NOTE — Progress Notes (Signed)
Physical Therapy Treatment Patient Details Name: Patricia Nelson MRN: [id number] DOB: 1915/05/11 Today's Date: 11/03/2016    History of Present Illness Patricia Pollio Gordonis a 83 y.o.femalewith medical history significant of anemia, chronic kidney disease, depression, HTN, HLD, macular degeneration, osteoporosis, atrial fibrillation chronic anticoagulant, RTHA. Presented with fall resulting in severe pain of her left hip and upper thigh. Sustained L femoral neck fx and underwent L hemiarthroplasty 10/31/16.    PT Comments    PTA assisted nursing to mobilize patient back to bed. Pt with bowel incontinence during transfer and required multiple bouts of rolling once returned to bed for perianal care.  Pt resting in bed waiting for transfer to SNF.  NT placing purewick post transfer back to bed.    Follow Up Recommendations  SNF     Equipment Recommendations  None recommended by PT    Recommendations for Other Services       Precautions / Restrictions Precautions Precautions: Fall;Posterior Hip Precaution Booklet Issued: Yes (comment) Precaution Comments: handout issued and reviewed with Pt's family as pt with baseline dementia; (no formal order for posterior precautions) Restrictions Weight Bearing Restrictions: Yes LLE Weight Bearing: Weight bearing as tolerated Other Position/Activity Restrictions: vision and hearing deficits    Mobility  Bed Mobility Overal bed mobility: Needs Assistance Bed Mobility: Sit to Supine;Rolling Rolling: +2 for physical assistance;Max assist   Supine to sit: Max assist;+2 for physical assistance;+2 for safety/equipment Sit to supine: Max assist;+2 for physical assistance   General bed mobility comments: PTA entered room and patient sitting on edge of bed with nursing who required assistance to mobilize patient back to bed. PTA assisted with a transfer to better position patient back on the matress.  Once positioned patient required assistance  with B LEs and trunk to return to supine.  Pt required +2 total assist to scoot in supine.  Pt with bowel incontinence during session and required multiple bouts of rolling for perianal care.  Pt able to follow commands for hand placement.    Transfers Overall transfer level: Needs assistance Equipment used: 2 person hand held assist Transfers: Sit to/from Stand Sit to Stand: Max assist;+2 physical assistance   Squat pivot transfers: Max assist;+2 physical assistance;+2 safety/equipment     General transfer comment: Performed partial sit to stand with hooking method Bilaterally to position patient higher in the bed.  Once in standing, nursing unlocked bed and moved bed further down before patient returned to seated position.  Pt fearful of falling during transfer.    Ambulation/Gait                 Stairs            Wheelchair Mobility    Modified Rankin (Stroke Patients Only)       Balance Overall balance assessment: Needs assistance;History of Falls Sitting-balance support: Feet supported;Single extremity supported Sitting balance-Leahy Scale: Zero Sitting balance - Comments: Requires maxA to maintain static sitting balance EOB  Postural control: Right lateral lean Standing balance support: Bilateral upper extremity supported Standing balance-Leahy Scale: Zero                              Cognition Arousal/Alertness: Awake/alert;Lethargic Behavior During Therapy: WFL for tasks assessed/performed Overall Cognitive Status: History of cognitive impairments - at baseline  General Comments: baseline dementia; Pt follows one step commands with multimodal cues and increased time/repetition; lethargic throughout but able to arouse, more alert upon sitting EOB       Exercises      General Comments        Pertinent Vitals/Pain Pain Assessment: 0-10 Faces Pain Scale: Hurts whole lot Pain Location: LLE  with movement  Pain Descriptors / Indicators: Grimacing;Sore;Discomfort Pain Intervention(s): Monitored during session;Repositioned    Home Living                      Prior Function            PT Goals (current goals can now be found in the care plan section) Acute Rehab PT Goals Patient Stated Goal: regain mobility; less pain  Potential to Achieve Goals: Fair Progress towards PT goals: Progressing toward goals    Frequency    Min 3X/week      PT Plan Current plan remains appropriate    Co-evaluation              AM-PAC PT "6 Clicks" Daily Activity  Outcome Measure  Difficulty turning over in bed (including adjusting bedclothes, sheets and blankets)?: Unable Difficulty moving from lying on back to sitting on the side of the bed? : Unable Difficulty sitting down on and standing up from a chair with arms (e.g., wheelchair, bedside commode, etc,.)?: Unable Help needed moving to and from a bed to chair (including a wheelchair)?: Total Help needed walking in hospital room?: Total Help needed climbing 3-5 steps with a railing? : Total 6 Click Score: 6    End of Session Equipment Utilized During Treatment: Gait belt Activity Tolerance: Patient tolerated treatment well Patient left: with call bell/phone within reach;with family/visitor present;in bed;with nursing/sitter in room Nurse Communication: Mobility status PT Visit Diagnosis: Difficulty in walking, not elsewhere classified (R26.2);Muscle weakness (generalized) (M62.81);Pain Pain - Right/Left: Left Pain - part of body: Hip     Time: 5397-6734 PT Time Calculation (min) (ACUTE ONLY): 16 min  Charges:  $Therapeutic Activity: 8-22 mins                    G Codes:       Governor Rooks, PTA pager [phone]    Cristela Blue 11/03/2016, 2:07 PM

## 2016-11-03 NOTE — Social Work (Addendum)
Clinical Social Worker facilitated patient discharge including contacting patient family and facility to confirm patient discharge plans.  Clinical information faxed to facility and family agreeable with plan.    CSW arranged ambulance transport via [facility] to [facility] .    RN to call [phone] (ask for nurse for patient) to give report prior to discharge.  Clinical Social Worker will sign off for now as social work intervention is no longer needed. Please consult [place] again if new need arises.  Elissa Hefty, LCSW Clinical Social Worker [phone]

## 2016-11-03 NOTE — Progress Notes (Signed)
[place] for warfarin Indication: atrial fibrillation  Allergies  Allergen Reactions  . Ace Inhibitors Cough  . Mirtazapine Other (See Comments)    Nightmares   . Penicillins Rash    Has patient had a PCN reaction causing immediate rash, facial/tongue/throat swelling, SOB or lightheadedness with hypotension: Yes Has patient had a PCN reaction causing severe rash involving mucus membranes or skin necrosis: Unknown Has patient had a PCN reaction that required hospitalization: Unknown Has patient had a PCN reaction occurring within the last 10 years: Unknown If all of the above answers are "NO", then may proceed with Cephalosporin use.     Labs:  Recent Labs  11/01/16 0353 11/02/16 0445 11/03/16 0318  HGB 10.0* 9.5* 10.3*  HCT 31.2* 29.8* 32.6*  PLT 179 163 197  LABPROT 16.4* 17.7* 16.9*  INR 1.33 1.47 1.38  CREATININE 0.90 0.80  --     Estimated Creatinine Clearance: 31.3 mL/min (by C-G formula based on SCr of 0.8 mg/dL).   Assessment: 83 year old female on warfarin for history of atrial fibrillation. Patient presented s/p fall this admission and Coumadin was reversed with Vitamin K for surgery. Patient is now s/p L-hemiarthroplasty and was resumed on warfarin on 9/9.  INR today is 1.3 PTA dose was 2 mg daily (this has just been reduced due to elevated INR on 10/13/16).   Goal of Therapy:  INR 2-3 Monitor platelets by anticoagulation protocol: Yes   Plan:  Coumadin 3 mg po x1 Daily INR    Hughes Better, PharmD, BCPS Clinical Pharmacist 11/03/2016 11:20 AM

## 2016-11-03 NOTE — Progress Notes (Signed)
Pt ready for d/c to SNF today. Report called to [facility] at [facility], all questions answered. Daughter given copy of AVS. Belongings sent with pt. [facility] transported pt to facility.  [place], Jerry Caras

## 2016-11-04 ENCOUNTER — Non-Acute Institutional Stay (SKILLED_NURSING_FACILITY): Payer: PPO | Admitting: Internal Medicine

## 2016-11-04 ENCOUNTER — Encounter: Payer: Self-pay | Admitting: Internal Medicine

## 2016-11-04 DIAGNOSIS — I679 Cerebrovascular disease, unspecified: Secondary | ICD-10-CM | POA: Diagnosis not present

## 2016-11-04 DIAGNOSIS — F32A Depression, unspecified: Secondary | ICD-10-CM

## 2016-11-04 DIAGNOSIS — S72002A Fracture of unspecified part of neck of left femur, initial encounter for closed fracture: Secondary | ICD-10-CM | POA: Diagnosis not present

## 2016-11-04 DIAGNOSIS — G8911 Acute pain due to trauma: Secondary | ICD-10-CM | POA: Diagnosis not present

## 2016-11-04 DIAGNOSIS — K5900 Constipation, unspecified: Secondary | ICD-10-CM | POA: Diagnosis not present

## 2016-11-04 DIAGNOSIS — R2681 Unsteadiness on feet: Secondary | ICD-10-CM | POA: Diagnosis not present

## 2016-11-04 DIAGNOSIS — I509 Heart failure, unspecified: Secondary | ICD-10-CM

## 2016-11-04 DIAGNOSIS — E039 Hypothyroidism, unspecified: Secondary | ICD-10-CM | POA: Diagnosis not present

## 2016-11-04 DIAGNOSIS — L89152 Pressure ulcer of sacral region, stage 2: Secondary | ICD-10-CM | POA: Diagnosis not present

## 2016-11-04 DIAGNOSIS — D500 Iron deficiency anemia secondary to blood loss (chronic): Secondary | ICD-10-CM | POA: Diagnosis not present

## 2016-11-04 DIAGNOSIS — F329 Major depressive disorder, single episode, unspecified: Secondary | ICD-10-CM | POA: Diagnosis not present

## 2016-11-04 DIAGNOSIS — J9601 Acute respiratory failure with hypoxia: Secondary | ICD-10-CM | POA: Diagnosis not present

## 2016-11-04 DIAGNOSIS — I4891 Unspecified atrial fibrillation: Secondary | ICD-10-CM

## 2016-11-04 DIAGNOSIS — S72002D Fracture of unspecified part of neck of left femur, subsequent encounter for closed fracture with routine healing: Secondary | ICD-10-CM | POA: Diagnosis not present

## 2016-11-04 DIAGNOSIS — I5040 Unspecified combined systolic (congestive) and diastolic (congestive) heart failure: Secondary | ICD-10-CM | POA: Diagnosis not present

## 2016-11-04 NOTE — Progress Notes (Signed)
Provider:  Blanchie Serve MD  Location:  [facility] Room Number: 107 Place of Service:  SNF (31)  PCP: Blanchie Serve, MD Patient Care Team: Blanchie Serve, MD as PCP - General (Internal Medicine) Mast, Man X, NP as Nurse Practitioner (Nurse Practitioner) Wilford Corner, MD as Consulting Physician (Gastroenterology) Croitoru, Dani Gobble, MD as Consulting Physician (Cardiology)  Extended Emergency Contact Information Primary Emergency Contact: [name],Pat Address: [place] [address] [name] [place] Phone: [phone] Mobile Phone: [phone] Relation: Daughter  Code Status: DNR Goals of Care: Advanced Directive information Advanced Directives 11/04/2016  Does Patient Have a Medical Advance Directive? Yes  Type of Paramedic of [place];Out of facility DNR (pink MOST or yellow form)  Does patient want to make changes to medical advance directive? No - Patient declined  Copy of [place] in Chart? Yes  Pre-existing out of facility DNR order (yellow form or pink MOST form) Yellow form placed in chart (order not valid for inpatient use)      Chief Complaint  Patient presents with  . Readmit To SNF    Readmission Visit     HPI: Patient is a 83 y.o. female seen today for re-admission visit. She was in the hospital from 10/27/16-11/03/16 post fall with displaced left femoral neck fracture. She underwent left hip hemiarthroplasty. Post surgery, there was concern for fluid overload with her hypoxia and chest xray findings suggestive of pulmonary edema. She responded well to iv furosemide. She is here for long term care in memory care unit. She has advanced dementia and has limited participation in HPI and ROS.    Past Medical History:  Diagnosis Date  . Anemia, unspecified   . Anorexia   . Anxiety   . Arthritis   . Atherosclerotic cerebrovascular disease   . Backache,  unspecified   . Cataract   . Cholelithiasis   . Chronic kidney disease, unspecified   . Closed fracture of right inferior pubic ramus ([place]) 11/12/2013   11/10/13 s/p fall, ED eval  X-ray R hip  1. Possible nondisplaced right inferior pubic ramus fracture.  2. No femur fracture or dislocation   02/06/14 dc prn Motrin and Norco-not used    . Depression   . Diaphragmatic hernia without mention of obstruction or gangrene   . Diseases of lips 03/26/2013  . Diverticulosis of colon (without mention of hemorrhage)   . Hemorrhage of rectum and anus 03/26/2013  . Hyperlipidemia   . Hypertension   . Insomnia, unspecified   . Laceration of occipital scalp 11/12/2013  . Macular degeneration 06/28/2013  . Macular degeneration (senile) of retina, unspecified   . Osteoporosis   . Other malaise and fatigue   . Other sleep disturbances   . Other specified cardiac dysrhythmias(427.89)   . Other specified personal history presenting hazards to health(V15.89)   . Other symptoms involving cardiovascular system   . Pancreatitis   . Personal history of other diseases of circulatory system   . Personal history of other diseases of digestive system    pancreatitis from gallstones  . Personal history of other diseases of respiratory system   . Personal history of traumatic fracture   . Rosacea   . Senile osteoporosis    with old T11 fracture  . Thyroid disease   . Unspecified disorders of arteries and arterioles   . Unspecified hearing loss   . Unspecified hemorrhoids without  mention of complication    Past Surgical History:  Procedure Laterality Date  . ABDOMINAL HYSTERECTOMY     and BSO fibroids  . COLONOSCOPY  05/23/2007  . EYE LID LIFT Right 09/2016   OD  . HIP ARTHROPLASTY Left 10/31/2016   Procedure: ARTHROPLASTY  HIP (HEMIARTHROPLASTY);  Surgeon: Paralee Cancel, MD;  Location: [facility];  Service: Orthopedics;  Laterality: Left;  . JOINT REPLACEMENT  1996  . TOTAL HIP ARTHROPLASTY Right     reports that  she has never smoked. She has never used smokeless tobacco. She reports that she does not drink alcohol or use drugs. Social History   Social History  . Marital status: Widowed    Spouse name: N/A  . Number of children: N/A  . Years of education: N/A   Occupational History  . retired Freight forwarder    Social History Main Topics  . Smoking status: Never Smoker  . Smokeless tobacco: Never Used  . Alcohol use No  . Drug use: No  . Sexual activity: No   Other Topics Concern  . Not on file   Social History Narrative   Lives at [facility] 2011, transferred to memory care 11/12/2013   Widowed   Caffeine   Exercise: none   Never smoked   Alcohol none   DNR    Functional Status Survey:    Family History  Problem Relation Age of Onset  . Heart disease Mother   . Diabetes Mother   . Heart disease Father   . Diabetes Sister   . Diabetes Brother   . Diabetes Daughter     Health Maintenance  Topic Date Due  . INFLUENZA VACCINE  11/22/2016 (Originally 09/22/2016)  . PNA vac Low Risk Adult (2 of 2 - PCV13) 02/22/2017 (Originally 09/22/2001)  . TETANUS/TDAP  11/11/2023  . DEXA SCAN  Completed    Allergies  Allergen Reactions  . Ace Inhibitors Cough  . Mirtazapine Other (See Comments)    Nightmares   . Penicillins Rash    Has patient had a PCN reaction causing immediate rash, facial/tongue/throat swelling, SOB or lightheadedness with hypotension: Yes Has patient had a PCN reaction causing severe rash involving mucus membranes or skin necrosis: Unknown Has patient had a PCN reaction that required hospitalization: Unknown Has patient had a PCN reaction occurring within the last 10 years: Unknown If all of the above answers are "NO", then may proceed with Cephalosporin use.     Outpatient Encounter Prescriptions as of 11/04/2016  Medication Sig  . acetaminophen (TYLENOL) 325 MG tablet Take 2 tablets (650 mg total) by mouth every 6 (six) hours as needed  for mild pain (or Fever >/= 101).  Marland Kitchen alum & mag hydroxide-simeth (MAALOX/MYLANTA) 200-200-20 MG/5ML suspension Take 30 mLs by mouth every 4 (four) hours as needed for indigestion.  Marland Kitchen aspirin 81 MG tablet Take 81 mg by mouth daily.  Marland Kitchen BYSTOLIC 10 MG tablet Take 10 mg by mouth daily.   . cholecalciferol 2000 units TABS Take 1 tablet (2,000 Units total) by mouth daily.  . clindamycin (CLEOCIN) 150 MG capsule Take 600 mg by mouth See admin instructions. ONE HOUR PRIOR TO DENTAL APPOINTMENTS  . diltiazem (CARDIZEM) 30 MG tablet Take 30 mg by mouth 4 (four) times daily.  Marland Kitchen escitalopram (LEXAPRO) 5 MG tablet Take 5 mg by mouth every other day.   . furosemide (LASIX) 20 MG tablet Take 20 mg by mouth as needed for edema (to be given with  the potassium 10 meq).   . hydrocortisone cream 1 % Apply 1 application topically 2 (two) times daily. TO HEMORRHOIDS  . levothyroxine (SYNTHROID, LEVOTHROID) 25 MCG tablet Take 25 mcg by mouth daily before breakfast.   . Lidocaine (ASPERCREME LIDOCAINE) 4 % PTCH Apply 1 patch topically at bedtime. APPLY TO LOWER BACK AND REMOVE IN THE MORNING  . memantine (NAMENDA XR) 28 MG CP24 24 hr capsule Take 28 mg by mouth.  . Nutritional Supplements (BENECALORIE PO) Take 1.5 oz by mouth 2 (two) times daily.   . ondansetron (ZOFRAN) 4 MG tablet Take 1 tablet (4 mg total) by mouth every 6 (six) hours as needed for nausea.  . polyethylene glycol (MIRALAX / GLYCOLAX) packet Take 17 g by mouth every other day. For constipation and hold for loose stools  . polyvinyl alcohol (LIQUIFILM TEARS) 1.4 % ophthalmic solution Place 1 drop into both eyes 4 (four) times daily.   . potassium chloride (K-DUR,KLOR-CON) 10 MEQ tablet Take 10 mEq by mouth as needed (for edema/if taking edema).   . warfarin (COUMADIN) 2 MG tablet Take 2 mg by mouth daily.  . [DISCONTINUED] acetaminophen (TYLENOL) 325 MG tablet Take 650 mg by mouth every 6 (six) hours as needed for mild pain.   . [DISCONTINUED]  metoCLOPramide (REGLAN) 5 MG tablet Take 1-2 tablets (5-10 mg total) by mouth every 8 (eight) hours as needed for nausea (if ondansetron (ZOFRAN) ineffective.).  . [DISCONTINUED] traMADol (ULTRAM) 50 MG tablet Take 1-2 tablets (50-100 mg total) by mouth every 6 (six) hours as needed.   No facility-administered encounter medications on file as of 11/04/2016.     Review of Systems  Unable to perform ROS: Dementia (obtained mostly from charge nurse)  Constitutional: Negative for appetite change, diaphoresis and fever.  HENT: Positive for trouble swallowing. Negative for rhinorrhea.   Respiratory: Positive for cough and shortness of breath.   Cardiovascular: Negative for chest pain.  Gastrointestinal: Negative for abdominal pain and vomiting.       Last bowel movement was this morning.   Genitourinary:       Has urinary incontience  Musculoskeletal: Positive for gait problem.       Denies pain this visit  Hematological: Bruises/bleeds easily.  Psychiatric/Behavioral: Positive for confusion.       Has advanced dementia    Vitals:   11/04/16 1115  BP: 126/76  Pulse: 92  Resp: 16  Temp: 97.6 F (36.4 C)  TempSrc: Oral  SpO2: 98%  Weight: 134 lb 6.4 oz (61 kg)  Height: 5' (1.524 m)   Body mass index is 26.25 kg/m. Physical Exam  Constitutional: She appears well-developed and well-nourished. No distress.  HENT:  Head: Normocephalic and atraumatic.  Mouth/Throat: Oropharynx is clear and moist.  Eyes: Pupils are equal, round, and reactive to light. Conjunctivae are normal.  Neck: Normal range of motion. Neck supple. No thyromegaly present.  Cardiovascular: Normal rate, regular rhythm and intact distal pulses.   Murmur heard. Pulmonary/Chest: Effort normal. She has no wheezes. She has no rales.  On 2 liters oxygen by nasal canula. Decreased air entry into lung bases  Abdominal: Soft. Bowel sounds are normal. She exhibits no distension. There is no tenderness. There is no rebound  and no guarding.  Musculoskeletal: She exhibits no edema.  Limited ROM with left hip due to pain  Lymphadenopathy:    She has no cervical adenopathy.  Neurological: She is alert.  Skin: Skin is warm and dry. She is not diaphoretic.  Bruising to arms and legs, left hip surgical incision with aquacel dressing clean and dry and intact.Stage 2 pressure ulcer to coccyx 0.3 x 0.4 cm. Blanchable red spot to lower mid back area 2 x 2 cm. Old surgical scar to left knee.   Psychiatric: She has a normal mood and affect. Her behavior is normal.    Labs reviewed: Basic Metabolic Panel:  Recent Labs  10/31/16 0236 11/01/16 0353 11/02/16 0445  NA 140 140 139  K 3.7 3.8 3.9  CL 112* 113* 112*  CO2 21* 21* 20*  GLUCOSE 108* 149* 110*  BUN 27* 32* 24*  CREATININE 0.90 0.90 0.80  CALCIUM 8.2* 8.3* 8.3*   Liver Function Tests:  Recent Labs  06/10/16 06/29/16 08/31/16 10/28/16 0329  AST 24 29 19   --   ALT 11 14 12   --   ALKPHOS 67 50 70  --   ALBUMIN  --   --   --  3.0*   No results for input(s): LIPASE, AMYLASE in the last 8760 hours. No results for input(s): AMMONIA in the last 8760 hours. CBC:  Recent Labs  10/27/16 1715  11/01/16 0353 11/02/16 0445 11/03/16 0318  WBC 9.6  < > 6.0 7.4 8.4  NEUTROABS 8.4*  --   --   --   --   HGB 12.4  < > 10.0* 9.5* 10.3*  HCT 39.1  < > 31.2* 29.8* 32.6*  MCV 94.7  < > 94.8 96.1 95.0  PLT 174  < > 179 163 197  < > = values in this interval not displayed. Cardiac Enzymes:  Recent Labs  10/28/16 1455 10/28/16 2228 10/29/16 0330  TROPONINI 0.09* 0.05* 0.04*   BNP: Invalid input(s): POCBNP No results found for: HGBA1C Lab Results  Component Value Date   TSH 2.048 10/27/2016   Lab Results  Component Value Date   [id number] 382 02/05/2012   No results found for: FOLATE No results found for: IRON, TIBC, FERRITIN  Imaging and Procedures obtained prior to SNF admission: Dg Chest 1 View  Result Date: 10/27/2016 CLINICAL DATA:  Fall  from standing.  LEFT hip pain. EXAM: CHEST 1 VIEW COMPARISON:  Chest radiograph February 20, 2009 FINDINGS: Cardiac silhouette is moderately enlarged. Calcified aortic knob. Similar chronic interstitial changes. Hyperinflation. Calcified granulomas. No pleural effusion or focal consolidation. Apical pleural thickening is similar. No pneumothorax. Osteopenia. Old T8 and T11 compression fractures. IMPRESSION: Stable cardiomegaly and COPD. Aortic Atherosclerosis (ICD10-I70.0). Electronically Signed   By: Elon Alas M.D.   On: 10/27/2016 18:14   Ct Head Wo Contrast  Result Date: 10/27/2016 CLINICAL DATA:  Fall from standing position with left hip fracture EXAM: CT HEAD WITHOUT CONTRAST CT CERVICAL SPINE WITHOUT CONTRAST TECHNIQUE: Multidetector CT imaging of the head and cervical spine was performed following the standard protocol without intravenous contrast. Multiplanar CT image reconstructions of the cervical spine were also generated. COMPARISON:  11/10/2013 FINDINGS: CT HEAD FINDINGS Brain: No acute territorial infarction, hemorrhage, or intracranial mass is seen. Marked atrophy. Mild small vessel ischemic changes of the white matter. Stable small left sub insula hypodensities. Stable enlarged ventricles, felt secondary to atrophy. Vascular: No hyperdense vessels. Carotid artery calcifications. Vertebral artery calcification. Skull: No fracture Sinuses/Orbits: Mild mucosal thickening within the ethmoid sinuses. No acute orbital abnormality. Other: None CT CERVICAL SPINE FINDINGS Alignment: Trace retrolisthesis of C5 on C6, unchanged. Trace anterolisthesis C3 and C4 unchanged. Facet alignment within normal limits. Skull base and vertebrae: No acute fracture. No primary bone lesion  or focal pathologic process. Soft tissues and spinal canal: No prevertebral fluid or swelling. No visible canal hematoma. Disc levels: Moderate severe degenerative changes at C4-C5, C5-C6 and C6-C7. Bilateral foraminal stenosis  C4 through C7, most notable at C5 C6. Upper chest: Apical scarring.  Carotid artery calcification. Other: None IMPRESSION: 1. No CT evidence for acute intracranial abnormality. Atrophy and small vessel ischemic changes of the white matter 2. Stable cervical spine alignment with degenerative changes. No acute osseous abnormality Electronically Signed   By: Donavan Foil M.D.   On: 10/27/2016 18:54   Ct Cervical Spine Wo Contrast  Result Date: 10/27/2016 CLINICAL DATA:  Fall from standing position with left hip fracture EXAM: CT HEAD WITHOUT CONTRAST CT CERVICAL SPINE WITHOUT CONTRAST TECHNIQUE: Multidetector CT imaging of the head and cervical spine was performed following the standard protocol without intravenous contrast. Multiplanar CT image reconstructions of the cervical spine were also generated. COMPARISON:  11/10/2013 FINDINGS: CT HEAD FINDINGS Brain: No acute territorial infarction, hemorrhage, or intracranial mass is seen. Marked atrophy. Mild small vessel ischemic changes of the white matter. Stable small left sub insula hypodensities. Stable enlarged ventricles, felt secondary to atrophy. Vascular: No hyperdense vessels. Carotid artery calcifications. Vertebral artery calcification. Skull: No fracture Sinuses/Orbits: Mild mucosal thickening within the ethmoid sinuses. No acute orbital abnormality. Other: None CT CERVICAL SPINE FINDINGS Alignment: Trace retrolisthesis of C5 on C6, unchanged. Trace anterolisthesis C3 and C4 unchanged. Facet alignment within normal limits. Skull base and vertebrae: No acute fracture. No primary bone lesion or focal pathologic process. Soft tissues and spinal canal: No prevertebral fluid or swelling. No visible canal hematoma. Disc levels: Moderate severe degenerative changes at C4-C5, C5-C6 and C6-C7. Bilateral foraminal stenosis C4 through C7, most notable at C5 C6. Upper chest: Apical scarring.  Carotid artery calcification. Other: None IMPRESSION: 1. No CT evidence for  acute intracranial abnormality. Atrophy and small vessel ischemic changes of the white matter 2. Stable cervical spine alignment with degenerative changes. No acute osseous abnormality Electronically Signed   By: Donavan Foil M.D.   On: 10/27/2016 18:54   Dg Hip Unilat With Pelvis 2-3 Views Left  Result Date: 10/27/2016 CLINICAL DATA:  Fall EXAM: DG HIP (WITH OR WITHOUT PELVIS) 2-3V LEFT COMPARISON:  None. FINDINGS: Fracture left femoral neck with mild displacement. Normal left hip joint Chronic fracture right pubic rami. Right hip hemiarthroplasty. Lumbar disc degeneration IMPRESSION: Displaced fracture left femoral neck Electronically Signed   By: Franchot Gallo M.D.   On: 10/27/2016 18:13   CXR 11/02/16 IMPRESSION:Stable cardiomegaly and small to moderate bilateral pleural effusions. Mild pulmonary vascular congestion  Echocardiogram 10/28/16 LVEF 55-60%, mild LVH   Assessment/Plan  Unsteady gait Will have patient work with PT/OT as tolerated to regain strength and restore function.  Fall precautions are in place.  Left femoral neck fracture Displaced, s/p left hip hemiarthroplasty. Will need orthopedic follow up. WBAT to LLE. To work with therapy team to help strengthen her gait and balance. Continue tylenol 650 mg q6h prn pain, this has been helpful. D/c tramadol and reassess if pain not controlled.  She is on warfarin and that will provide dvt prophylaxis. D/c aspirin   Chronic diastolic CHF With recent pulmonary edema requiring diuresis in hospital. Breathing currently stable. No crackles on lung exam, no leg edema. Monitor her weight. Continue bystolic and to provide lasix with kcl supplement as needed for weight gain of > 3lb . Reviewed echo 9/6- stable LVEF.   Acute respiratory failure With fluid overload  likely post op precipitated with known history of CHF. Continue o2 by nasal canula and wean as tolerated  Anemia Blood loss anemia post op, check cbc  Stage 2 pressure ulcer To  coccyx, clean area with NS, let dry and apply hydrocolloid dressing q 3 days. Pressure ulcer prophylaxis.   Chronic depression Stable mood. Continue escitalopram 5 mg qod for now  afib Controlled HR, continue diltiazem 30 mg qid for rate control and warfarin for anticoagulation  Dementia without behavior disturbance Supportive care, continue memantine 28 mg daily  Hypothyroidism Stable TSH, continue levothyroxine  Constipation Continue miralax qod and monitor  Family/ staff Communication: reviewed care plan with patient and charge nurse.    Labs/tests ordered: cbc, bmp  Blanchie Serve, MD Internal Medicine [facility] [address] [place], [place] [address] Cell Phone (Monday-Friday 8 am - 5 pm): [phone] On Call: [phone] and follow prompts after 5 pm and on weekends Office Phone: [phone] Office Fax: [phone]

## 2016-11-05 DIAGNOSIS — R278 Other lack of coordination: Secondary | ICD-10-CM | POA: Diagnosis not present

## 2016-11-05 DIAGNOSIS — R413 Other amnesia: Secondary | ICD-10-CM | POA: Diagnosis not present

## 2016-11-05 DIAGNOSIS — R1312 Dysphagia, oropharyngeal phase: Secondary | ICD-10-CM | POA: Diagnosis not present

## 2016-11-05 DIAGNOSIS — R296 Repeated falls: Secondary | ICD-10-CM | POA: Diagnosis not present

## 2016-11-05 DIAGNOSIS — I679 Cerebrovascular disease, unspecified: Secondary | ICD-10-CM | POA: Diagnosis not present

## 2016-11-05 DIAGNOSIS — S72002D Fracture of unspecified part of neck of left femur, subsequent encounter for closed fracture with routine healing: Secondary | ICD-10-CM | POA: Diagnosis not present

## 2016-11-05 DIAGNOSIS — R351 Nocturia: Secondary | ICD-10-CM | POA: Diagnosis not present

## 2016-11-05 DIAGNOSIS — R488 Other symbolic dysfunctions: Secondary | ICD-10-CM | POA: Diagnosis not present

## 2016-11-05 DIAGNOSIS — L299 Pruritus, unspecified: Secondary | ICD-10-CM | POA: Diagnosis not present

## 2016-11-05 DIAGNOSIS — R41841 Cognitive communication deficit: Secondary | ICD-10-CM | POA: Diagnosis not present

## 2016-11-05 DIAGNOSIS — R2681 Unsteadiness on feet: Secondary | ICD-10-CM | POA: Diagnosis not present

## 2016-11-05 DIAGNOSIS — M6281 Muscle weakness (generalized): Secondary | ICD-10-CM | POA: Diagnosis not present

## 2016-11-05 DIAGNOSIS — Z9181 History of falling: Secondary | ICD-10-CM | POA: Diagnosis not present

## 2016-11-05 DIAGNOSIS — R262 Difficulty in walking, not elsewhere classified: Secondary | ICD-10-CM | POA: Diagnosis not present

## 2016-11-05 DIAGNOSIS — K5900 Constipation, unspecified: Secondary | ICD-10-CM | POA: Diagnosis not present

## 2016-11-05 DIAGNOSIS — M8580 Other specified disorders of bone density and structure, unspecified site: Secondary | ICD-10-CM | POA: Diagnosis not present

## 2016-11-05 DIAGNOSIS — F418 Other specified anxiety disorders: Secondary | ICD-10-CM | POA: Diagnosis not present

## 2016-11-05 LAB — CULTURE, BLOOD (ROUTINE X 2)
CULTURE: NO GROWTH
Culture: NO GROWTH
SPECIAL REQUESTS: ADEQUATE
SPECIAL REQUESTS: ADEQUATE

## 2016-11-06 DIAGNOSIS — L89152 Pressure ulcer of sacral region, stage 2: Secondary | ICD-10-CM | POA: Insufficient documentation

## 2016-11-08 ENCOUNTER — Encounter: Payer: Self-pay | Admitting: Nurse Practitioner

## 2016-11-08 ENCOUNTER — Non-Acute Institutional Stay (SKILLED_NURSING_FACILITY): Payer: PPO | Admitting: Nurse Practitioner

## 2016-11-08 DIAGNOSIS — R58 Hemorrhage, not elsewhere classified: Secondary | ICD-10-CM | POA: Insufficient documentation

## 2016-11-08 DIAGNOSIS — I4891 Unspecified atrial fibrillation: Secondary | ICD-10-CM | POA: Diagnosis not present

## 2016-11-08 DIAGNOSIS — Z5181 Encounter for therapeutic drug level monitoring: Secondary | ICD-10-CM

## 2016-11-08 DIAGNOSIS — Z7901 Long term (current) use of anticoagulants: Secondary | ICD-10-CM | POA: Diagnosis not present

## 2016-11-08 NOTE — Assessment & Plan Note (Signed)
Update PT/INR, goal of INR is 2-3, noted a large ecchymosis of the right forearm and hand.

## 2016-11-08 NOTE — Assessment & Plan Note (Addendum)
The patient was noted to have a large ecchymoses over the back of the right and and right forarm, no pain when palpated. Takes Coumadin for Afib may contributory. Update PT/INR

## 2016-11-08 NOTE — Progress Notes (Signed)
Location:  [place] [facility] Room Number: 107 Place of Service:  SNF (31) Provider:  Ja Ohman, Manxie  NP  Blanchie Serve, MD  Patient Care Team: Blanchie Serve, MD as PCP - General (Internal Medicine) Ta Fair X, NP as Nurse Practitioner (Nurse Practitioner) Wilford Corner, MD as Consulting Physician (Gastroenterology) Sanda Klein, MD as Consulting Physician (Cardiology)  Extended Emergency Contact Information Primary Emergency Contact: [name],Pat Address: [place] [address] [name] of [place] Phone: [phone] Mobile Phone: [phone] Relation: Daughter  Code Status:  DNR Goals of care: Advanced Directive information Advanced Directives 11/08/2016  Does Patient Have a Medical Advance Directive? Yes  Type of Paramedic of [place];Out of facility DNR (pink MOST or yellow form)  Does patient want to make changes to medical advance directive? No - Patient declined  Copy of [place] in Chart? Yes  Pre-existing out of facility DNR order (yellow form or pink MOST form) Yellow form placed in chart (order not valid for inpatient use)     Chief Complaint  Patient presents with  . Acute Visit    Pain to (RT0 arm, small amount of swelling to hand and arm    HPI:  Pt is a 83 y.o. female seen today for an acute visit for reported right arm pain for 2 days, noted slighter swelling to hand and limited ROM of the right arm, pain noted with the right arm movement, better at rest. No apparent swelling in the right arm or hand, the patient denied pain with the right arm movement, the patient's daughter HPOA presented during the visit. The patient was noted to have a large ecchymosis over the back of the right and and right forearm, no pain when palpated.    Hx of afib, heart rate is in control, taking Diltiazem 30mg  qid, Byostolic 10mg  daily, taking Coumadin for thromboembolic risk reduction.      Recent hospitalization 10/27/16 to 11/03/16 for the fracture of the left femoral neck, s/p  left hemiarthroplasty   Past Medical History:  Diagnosis Date  . Anemia, unspecified   . Anorexia   . Anxiety   . Arthritis   . Atherosclerotic cerebrovascular disease   . Backache, unspecified   . Cataract   . Cholelithiasis   . Chronic kidney disease, unspecified   . Closed fracture of right inferior pubic ramus ([place]) 11/12/2013   11/10/13 s/p fall, ED eval  X-ray R hip  1. Possible nondisplaced right inferior pubic ramus fracture.  2. No femur fracture or dislocation   02/06/14 dc prn Motrin and Norco-not used    . Depression   . Diaphragmatic hernia without mention of obstruction or gangrene   . Diseases of lips 03/26/2013  . Diverticulosis of colon (without mention of hemorrhage)   . Hemorrhage of rectum and anus 03/26/2013  . Hyperlipidemia   . Hypertension   . Insomnia, unspecified   . Laceration of occipital scalp 11/12/2013  . Macular degeneration 06/28/2013  . Macular degeneration (senile) of retina, unspecified   . Osteoporosis   . Other malaise and fatigue   . Other sleep disturbances   . Other specified cardiac dysrhythmias(427.89)   . Other specified personal history presenting hazards to health(V15.89)   . Other symptoms involving cardiovascular system   . Pancreatitis   . Personal history of other diseases of circulatory system   . Personal history of other diseases of digestive system  pancreatitis from gallstones  . Personal history of other diseases of respiratory system   . Personal history of traumatic fracture   . Rosacea   . Senile osteoporosis    with old T11 fracture  . Thyroid disease   . Unspecified disorders of arteries and arterioles   . Unspecified hearing loss   . Unspecified hemorrhoids without mention of complication    Past Surgical History:  Procedure Laterality Date  . ABDOMINAL HYSTERECTOMY     and BSO fibroids  . COLONOSCOPY  05/23/2007  . EYE  LID LIFT Right 09/2016   OD  . HIP ARTHROPLASTY Left 10/31/2016   Procedure: ARTHROPLASTY  HIP (HEMIARTHROPLASTY);  Surgeon: Paralee Cancel, MD;  Location: [facility];  Service: Orthopedics;  Laterality: Left;  . JOINT REPLACEMENT  1996  . TOTAL HIP ARTHROPLASTY Right     Allergies  Allergen Reactions  . Ace Inhibitors Cough  . Mirtazapine Other (See Comments)    Nightmares   . Penicillins Rash    Has patient had a PCN reaction causing immediate rash, facial/tongue/throat swelling, SOB or lightheadedness with hypotension: Yes Has patient had a PCN reaction causing severe rash involving mucus membranes or skin necrosis: Unknown Has patient had a PCN reaction that required hospitalization: Unknown Has patient had a PCN reaction occurring within the last 10 years: Unknown If all of the above answers are "NO", then may proceed with Cephalosporin use.     Outpatient Encounter Prescriptions as of 11/08/2016  Medication Sig  . acetaminophen (TYLENOL) 325 MG tablet Take 2 tablets (650 mg total) by mouth every 6 (six) hours as needed for mild pain (or Fever >/= 101).  Marland Kitchen alum & mag hydroxide-simeth (MAALOX/MYLANTA) 200-200-20 MG/5ML suspension Take 30 mLs by mouth every 4 (four) hours as needed for indigestion.  Marland Kitchen BYSTOLIC 10 MG tablet Take 10 mg by mouth daily.   . cholecalciferol 2000 units TABS Take 1 tablet (2,000 Units total) by mouth daily.  . clindamycin (CLEOCIN) 150 MG capsule Take 600 mg by mouth See admin instructions. ONE HOUR PRIOR TO DENTAL APPOINTMENTS  . diltiazem (CARDIZEM) 30 MG tablet Take 30 mg by mouth 4 (four) times daily.  Marland Kitchen escitalopram (LEXAPRO) 5 MG tablet Take 5 mg by mouth every other day.   . furosemide (LASIX) 20 MG tablet Take 20 mg by mouth as needed for edema (to be given with the potassium 10 meq).   . hydrocortisone cream 1 % Apply 1 application topically 2 (two) times daily. TO HEMORRHOIDS  . levothyroxine (SYNTHROID, LEVOTHROID) 25 MCG tablet Take 25 mcg by mouth  daily before breakfast.   . Lidocaine (ASPERCREME LIDOCAINE) 4 % PTCH Apply 1 patch topically at bedtime. APPLY TO LOWER BACK AND REMOVE IN THE MORNING  . memantine (NAMENDA XR) 28 MG CP24 24 hr capsule Take 28 mg by mouth.  . Nutritional Supplements (BENECALORIE PO) Take 1.5 oz by mouth 2 (two) times daily.   . ondansetron (ZOFRAN) 4 MG tablet Take 1 tablet (4 mg total) by mouth every 6 (six) hours as needed for nausea.  . polyethylene glycol (MIRALAX / GLYCOLAX) packet Take 17 g by mouth every other day. For constipation and hold for loose stools  . polyvinyl alcohol (LIQUIFILM TEARS) 1.4 % ophthalmic solution Place 1 drop into both eyes 4 (four) times daily.   . potassium chloride (K-DUR,KLOR-CON) 10 MEQ tablet Take 10 mEq by mouth as needed (for edema/if taking edema).   . warfarin (COUMADIN) 3 MG tablet Take 3 mg by  mouth daily.  . [DISCONTINUED] aspirin 81 MG tablet Take 81 mg by mouth daily.  . [DISCONTINUED] warfarin (COUMADIN) 2 MG tablet Take 2 mg by mouth daily.   No facility-administered encounter medications on file as of 11/08/2016.    ROS is provided with assistance of the patient's daughter HPOA and staff.  Review of Systems  Musculoskeletal:       Seen in wheel chair  Skin: Positive for color change. Negative for pallor and rash.       A large bruise back of right hand and forearm.     Immunization History  Administered Date(s) Administered  . DTaP 10/12/2012  . Influenza-Unspecified 11/22/2012, 12/26/2013, 11/12/2014, 12/09/2015  . PPD Test 12/06/2013  . Pneumococcal Polysaccharide-23 09/22/2000  . Td 07/08/2005  . Tdap 11/10/2013   Pertinent  Health Maintenance Due  Topic Date Due  . INFLUENZA VACCINE  11/22/2016 (Originally 09/22/2016)  . PNA vac Low Risk Adult (2 of 2 - PCV13) 02/22/2017 (Originally 09/22/2001)  . DEXA SCAN  Completed   Fall Risk  10/22/2016 01/13/2015 06/28/2013  Falls in the past year? No No No  Risk for fall due to : - Impaired balance/gait -    Functional Status Survey:    Vitals:   11/08/16 1133  BP: 134/68  Pulse: 74  Resp: 18  Temp: 98.2 F (36.8 C)  Weight: 127 lb (57.6 kg)  Height: 5' (1.524 m)   Body mass index is 24.8 kg/m. Physical Exam  Constitutional: She appears well-developed and well-nourished. No distress.  Neck: Normal range of motion. Neck supple. No JVD present. No thyromegaly present.  Cardiovascular:  Irregular heart beats.   Musculoskeletal: Normal range of motion. She exhibits no edema or tenderness.  BLE  Lymphadenopathy:    She has no cervical adenopathy.  Neurological: She is alert.  Oriented to person and place.   Skin: Skin is warm and dry. No rash noted. She is not diaphoretic. No erythema. No pallor.  A large ecchymosis dorsum of the right hand and the right forearm.     Labs reviewed:  Recent Labs  10/31/16 0236 11/01/16 0353 11/02/16 0445  NA 140 140 139  K 3.7 3.8 3.9  CL 112* 113* 112*  CO2 21* 21* 20*  GLUCOSE 108* 149* 110*  BUN 27* 32* 24*  CREATININE 0.90 0.90 0.80  CALCIUM 8.2* 8.3* 8.3*    Recent Labs  06/10/16 06/29/16 08/31/16 10/28/16 0329  AST 24 29 19   --   ALT 11 14 12   --   ALKPHOS 67 50 70  --   ALBUMIN  --   --   --  3.0*    Recent Labs  10/27/16 1715  11/01/16 0353 11/02/16 0445 11/03/16 0318  WBC 9.6  < > 6.0 7.4 8.4  NEUTROABS 8.4*  --   --   --   --   HGB 12.4  < > 10.0* 9.5* 10.3*  HCT 39.1  < > 31.2* 29.8* 32.6*  MCV 94.7  < > 94.8 96.1 95.0  PLT 174  < > 179 163 197  < > = values in this interval not displayed. Lab Results  Component Value Date   TSH 2.048 10/27/2016   No results found for: HGBA1C No results found for: CHOL, HDL, LDLCALC, LDLDIRECT, TRIG, CHOLHDL  Significant Diagnostic Results in last 30 days:  Dg Chest 1 View  Result Date: 10/27/2016 CLINICAL DATA:  Fall from standing.  LEFT hip pain. EXAM: CHEST 1 VIEW COMPARISON:  Chest radiograph February 20, 2009 FINDINGS: Cardiac silhouette is moderately enlarged.  Calcified aortic knob. Similar chronic interstitial changes. Hyperinflation. Calcified granulomas. No pleural effusion or focal consolidation. Apical pleural thickening is similar. No pneumothorax. Osteopenia. Old T8 and T11 compression fractures. IMPRESSION: Stable cardiomegaly and COPD. Aortic Atherosclerosis (ICD10-I70.0). Electronically Signed   By: Elon Alas M.D.   On: 10/27/2016 18:14   Ct Head Wo Contrast  Result Date: 10/27/2016 CLINICAL DATA:  Fall from standing position with left hip fracture EXAM: CT HEAD WITHOUT CONTRAST CT CERVICAL SPINE WITHOUT CONTRAST TECHNIQUE: Multidetector CT imaging of the head and cervical spine was performed following the standard protocol without intravenous contrast. Multiplanar CT image reconstructions of the cervical spine were also generated. COMPARISON:  11/10/2013 FINDINGS: CT HEAD FINDINGS Brain: No acute territorial infarction, hemorrhage, or intracranial mass is seen. Marked atrophy. Mild small vessel ischemic changes of the white matter. Stable small left sub insula hypodensities. Stable enlarged ventricles, felt secondary to atrophy. Vascular: No hyperdense vessels. Carotid artery calcifications. Vertebral artery calcification. Skull: No fracture Sinuses/Orbits: Mild mucosal thickening within the ethmoid sinuses. No acute orbital abnormality. Other: None CT CERVICAL SPINE FINDINGS Alignment: Trace retrolisthesis of C5 on C6, unchanged. Trace anterolisthesis C3 and C4 unchanged. Facet alignment within normal limits. Skull base and vertebrae: No acute fracture. No primary bone lesion or focal pathologic process. Soft tissues and spinal canal: No prevertebral fluid or swelling. No visible canal hematoma. Disc levels: Moderate severe degenerative changes at C4-C5, C5-C6 and C6-C7. Bilateral foraminal stenosis C4 through C7, most notable at C5 C6. Upper chest: Apical scarring.  Carotid artery calcification. Other: None IMPRESSION: 1. No CT evidence for acute  intracranial abnormality. Atrophy and small vessel ischemic changes of the white matter 2. Stable cervical spine alignment with degenerative changes. No acute osseous abnormality Electronically Signed   By: Donavan Foil M.D.   On: 10/27/2016 18:54   Ct Cervical Spine Wo Contrast  Result Date: 10/27/2016 CLINICAL DATA:  Fall from standing position with left hip fracture EXAM: CT HEAD WITHOUT CONTRAST CT CERVICAL SPINE WITHOUT CONTRAST TECHNIQUE: Multidetector CT imaging of the head and cervical spine was performed following the standard protocol without intravenous contrast. Multiplanar CT image reconstructions of the cervical spine were also generated. COMPARISON:  11/10/2013 FINDINGS: CT HEAD FINDINGS Brain: No acute territorial infarction, hemorrhage, or intracranial mass is seen. Marked atrophy. Mild small vessel ischemic changes of the white matter. Stable small left sub insula hypodensities. Stable enlarged ventricles, felt secondary to atrophy. Vascular: No hyperdense vessels. Carotid artery calcifications. Vertebral artery calcification. Skull: No fracture Sinuses/Orbits: Mild mucosal thickening within the ethmoid sinuses. No acute orbital abnormality. Other: None CT CERVICAL SPINE FINDINGS Alignment: Trace retrolisthesis of C5 on C6, unchanged. Trace anterolisthesis C3 and C4 unchanged. Facet alignment within normal limits. Skull base and vertebrae: No acute fracture. No primary bone lesion or focal pathologic process. Soft tissues and spinal canal: No prevertebral fluid or swelling. No visible canal hematoma. Disc levels: Moderate severe degenerative changes at C4-C5, C5-C6 and C6-C7. Bilateral foraminal stenosis C4 through C7, most notable at C5 C6. Upper chest: Apical scarring.  Carotid artery calcification. Other: None IMPRESSION: 1. No CT evidence for acute intracranial abnormality. Atrophy and small vessel ischemic changes of the white matter 2. Stable cervical spine alignment with degenerative  changes. No acute osseous abnormality Electronically Signed   By: Donavan Foil M.D.   On: 10/27/2016 18:54   Pelvis Portable  Result Date: 10/31/2016 CLINICAL DATA:  Arthroplasty left hip. EXAM:  PORTABLE PELVIS 1-2 VIEWS COMPARISON:  Plain film of the pelvis dated 08/25/2009. FINDINGS: Status post left hip arthroplasty. Hardware appears intact and appropriately position. Right hip arthroplasty is stable in position. Overall osseous alignment appears anatomic. Expected postsurgical changes within the soft tissues about the left hip. IMPRESSION: Status post left hip arthroplasty. Hardware appears intact and appropriately positioned. No evidence of surgical complicating feature. Electronically Signed   By: Franki Cabot M.D.   On: 10/31/2016 12:08   Dg Chest Port 1 View  Result Date: 11/02/2016 CLINICAL DATA:  Hypoxia. EXAM: PORTABLE CHEST 1 VIEW COMPARISON:  Chest x-ray from yesterday. FINDINGS: Stable cardiomegaly. Mild pulmonary vascular congestion. Unchanged small to moderate bilateral pleural effusions. No pneumothorax. IMPRESSION: Stable cardiomegaly and small to moderate bilateral pleural effusions. Electronically Signed   By: Titus Dubin M.D.   On: 11/02/2016 09:26   Dg Chest Port 1 View  Result Date: 11/01/2016 CLINICAL DATA:  Hypoxia EXAM: PORTABLE CHEST 1 VIEW COMPARISON:  10/28/2014 FINDINGS: New small to moderate bilateral pleural effusions with central vascular congestion and pulmonary vascular redistribution consistent with CHF. Stable cardiomegaly with aortic atherosclerosis. Calcified nodule the right upper lobe. Old T8 and T11 compression fractures. IMPRESSION: 1. Mild CHF with small to moderate bilateral pleural effusions. 2. Chronic stable T8 and T11 compression fractures of the thoracic spine. 3. Granuloma in the right upper lobe, stable appearance Electronically Signed   By: Ashley Royalty M.D.   On: 11/01/2016 20:47   Dg Hip Unilat With Pelvis 2-3 Views Left  Result Date:  10/27/2016 CLINICAL DATA:  Fall EXAM: DG HIP (WITH OR WITHOUT PELVIS) 2-3V LEFT COMPARISON:  None. FINDINGS: Fracture left femoral neck with mild displacement. Normal left hip joint Chronic fracture right pubic rami. Right hip hemiarthroplasty. Lumbar disc degeneration IMPRESSION: Displaced fracture left femoral neck Electronically Signed   By: Franchot Gallo M.D.   On: 10/27/2016 18:13    Assessment/Plan Ecchymosis The patient was noted to have a large ecchymoses over the back of the right and and right forarm, no pain when palpated. Takes Coumadin for Afib may contributory. Update PT/INR     A-fib (HCC) Heart rate is in control, continue Diltiazem 30mg  po qid, Bystolic 10mg  po daily.   Anticoagulation goal of INR 2 to 3 Update PT/INR, goal of INR is 2-3, noted a large ecchymosis of the right forearm and hand.      Family/ staff Communication: plan of care reviewed with the patient and charge nurse.   Labs/tests ordered: PT/INR  Time spend 25 minutes.

## 2016-11-08 NOTE — Assessment & Plan Note (Signed)
Heart rate is in control, continue Diltiazem 30mg  po qid, Bystolic 10mg  po daily.

## 2016-11-11 ENCOUNTER — Non-Acute Institutional Stay (SKILLED_NURSING_FACILITY): Payer: PPO | Admitting: Internal Medicine

## 2016-11-11 ENCOUNTER — Encounter: Payer: Self-pay | Admitting: Internal Medicine

## 2016-11-11 DIAGNOSIS — R791 Abnormal coagulation profile: Secondary | ICD-10-CM

## 2016-11-11 DIAGNOSIS — I4891 Unspecified atrial fibrillation: Secondary | ICD-10-CM | POA: Diagnosis not present

## 2016-11-11 DIAGNOSIS — I679 Cerebrovascular disease, unspecified: Secondary | ICD-10-CM | POA: Diagnosis not present

## 2016-11-11 DIAGNOSIS — R600 Localized edema: Secondary | ICD-10-CM

## 2016-11-11 DIAGNOSIS — I480 Paroxysmal atrial fibrillation: Secondary | ICD-10-CM

## 2016-11-11 DIAGNOSIS — D500 Iron deficiency anemia secondary to blood loss (chronic): Secondary | ICD-10-CM | POA: Diagnosis not present

## 2016-11-11 DIAGNOSIS — R000 Tachycardia, unspecified: Secondary | ICD-10-CM | POA: Diagnosis not present

## 2016-11-11 LAB — BASIC METABOLIC PANEL
BUN: 19 (ref 4–21)
Creatinine: 0.8 (ref 0.5–1.1)
GLUCOSE: 89
POTASSIUM: 3.6 (ref 3.4–5.3)
Sodium: 142 (ref 137–147)

## 2016-11-11 LAB — CBC AND DIFFERENTIAL
HCT: 29 — AB (ref 36–46)
HEMOGLOBIN: 9.8 — AB (ref 12.0–16.0)
Platelets: 419 — AB (ref 150–399)
WBC: 6.4

## 2016-11-11 LAB — POCT INR: INR: 4.2 — AB (ref 0.9–1.1)

## 2016-11-11 NOTE — Progress Notes (Signed)
Provider:  Blanchie Serve MD  Location:  [facility] Room Number: 107 Place of Service:  SNF (31)  PCP: Blanchie Serve, MD Patient Care Team: Blanchie Serve, MD as PCP - General (Internal Medicine) Mast, Man X, NP as Nurse Practitioner (Nurse Practitioner) Wilford Corner, MD as Consulting Physician (Gastroenterology) Croitoru, Dani Gobble, MD as Consulting Physician (Cardiology)  Extended Emergency Contact Information Primary Emergency Contact: [name],Pat Address: [place] [address] [name] [place] Phone: [phone] Mobile Phone: [phone] Relation: Daughter  Code Status: DNR Goals of Care: Advanced Directive information Advanced Directives 11/11/2016  Does Patient Have a Medical Advance Directive? Yes  Type of Paramedic of [place];Out of facility DNR (pink MOST or yellow form)  Does patient want to make changes to medical advance directive? No - Patient declined  Copy of [place] in Chart? Yes  Pre-existing out of facility DNR order (yellow form or pink MOST form) Yellow form placed in chart (order not valid for inpatient use)      Chief Complaint  Patient presents with  . Acute Visit    Left leg swelling and supratherapeutic INR.     HPI: Patient is a 83 y.o. female seen today for acute visit.  Left leg edema- staff have noticed increased swelling to her left leg- mainly around thigh area. Pt has advanced dementia and this limits her HPI and ROS. She denies pain. She is on tylenol but has not asked for it. she is working with therapy team. No fall reported. She has displaced left femoral neck fracture s/p left hip hemiarthroplasty.   supratherapeutic inr- no bleed reported. Currently on warfarin 3 mg daily with history of afib and recent surgery.   afib- denies chest pain and palpitation. Tolerating diltiazem and warfarin  Past Medical History:  Diagnosis Date    . Anemia, unspecified   . Anorexia   . Anxiety   . Arthritis   . Atherosclerotic cerebrovascular disease   . Backache, unspecified   . Cataract   . Cholelithiasis   . Chronic kidney disease, unspecified   . Closed fracture of right inferior pubic ramus ([place]) 11/12/2013   11/10/13 s/p fall, ED eval  X-ray R hip  1. Possible nondisplaced right inferior pubic ramus fracture.  2. No femur fracture or dislocation   02/06/14 dc prn Motrin and Norco-not used    . Depression   . Diaphragmatic hernia without mention of obstruction or gangrene   . Diseases of lips 03/26/2013  . Diverticulosis of colon (without mention of hemorrhage)   . Hemorrhage of rectum and anus 03/26/2013  . Hyperlipidemia   . Hypertension   . Insomnia, unspecified   . Laceration of occipital scalp 11/12/2013  . Macular degeneration 06/28/2013  . Macular degeneration (senile) of retina, unspecified   . Osteoporosis   . Other malaise and fatigue   . Other sleep disturbances   . Other specified cardiac dysrhythmias(427.89)   . Other specified personal history presenting hazards to health(V15.89)   . Other symptoms involving cardiovascular system   . Pancreatitis   . Personal history of other diseases of circulatory system   . Personal history of other diseases of digestive system    pancreatitis from gallstones  . Personal history of other diseases of respiratory system   . Personal history of traumatic fracture   . Rosacea   . Senile osteoporosis    with old  T11 fracture  . Thyroid disease   . Unspecified disorders of arteries and arterioles   . Unspecified hearing loss   . Unspecified hemorrhoids without mention of complication    Past Surgical History:  Procedure Laterality Date  . ABDOMINAL HYSTERECTOMY     and BSO fibroids  . COLONOSCOPY  05/23/2007  . EYE LID LIFT Right 09/2016   OD  . HIP ARTHROPLASTY Left 10/31/2016   Procedure: ARTHROPLASTY  HIP (HEMIARTHROPLASTY);  Surgeon: Paralee Cancel, MD;  Location: [facility];  Service: Orthopedics;  Laterality: Left;  . JOINT REPLACEMENT  1996  . TOTAL HIP ARTHROPLASTY Right     reports that she has never smoked. She has never used smokeless tobacco. She reports that she does not drink alcohol or use drugs. Social History   Social History  . Marital status: Widowed    Spouse name: N/A  . Number of children: N/A  . Years of education: N/A   Occupational History  . retired Freight forwarder    Social History Main Topics  . Smoking status: Never Smoker  . Smokeless tobacco: Never Used  . Alcohol use No  . Drug use: No  . Sexual activity: No   Other Topics Concern  . Not on file   Social History Narrative   Lives at [facility] 2011, transferred to memory care 11/12/2013   Widowed   Caffeine   Exercise: none   Never smoked   Alcohol none   DNR    Functional Status Survey:    Family History  Problem Relation Age of Onset  . Heart disease Mother   . Diabetes Mother   . Heart disease Father   . Diabetes Sister   . Diabetes Brother   . Diabetes Daughter     Health Maintenance  Topic Date Due  . INFLUENZA VACCINE  11/22/2016 (Originally 09/22/2016)  . PNA vac Low Risk Adult (2 of 2 - PCV13) 02/22/2017 (Originally 09/22/2001)  . TETANUS/TDAP  11/11/2023  . DEXA SCAN  Completed    Allergies  Allergen Reactions  . Ace Inhibitors Cough  . Mirtazapine Other (See Comments)    Nightmares   . Penicillins Rash    Has patient had a PCN reaction causing immediate rash, facial/tongue/throat swelling, SOB or lightheadedness with hypotension: Yes Has patient had a PCN reaction causing severe rash involving mucus membranes or skin necrosis: Unknown Has patient had a PCN reaction that required hospitalization: Unknown Has patient had a PCN reaction occurring within the last 10 years: Unknown If all of the above answers are "NO", then may proceed with Cephalosporin use.     Outpatient Encounter Prescriptions as of 11/11/2016    Medication Sig  . acetaminophen (TYLENOL) 325 MG tablet Take 2 tablets (650 mg total) by mouth every 6 (six) hours as needed for mild pain (or Fever >/= 101).  Marland Kitchen alum & mag hydroxide-simeth (MAALOX/MYLANTA) 200-200-20 MG/5ML suspension Take 30 mLs by mouth every 4 (four) hours as needed for indigestion.  Marland Kitchen BYSTOLIC 10 MG tablet Take 10 mg by mouth daily.   . cholecalciferol 2000 units TABS Take 1 tablet (2,000 Units total) by mouth daily.  . clindamycin (CLEOCIN) 150 MG capsule Take 600 mg by mouth See admin instructions. ONE HOUR PRIOR TO DENTAL APPOINTMENTS  . diltiazem (CARDIZEM) 30 MG tablet Take 30 mg by mouth 4 (four) times daily.  Marland Kitchen escitalopram (LEXAPRO) 5 MG tablet Take 5 mg by mouth every other day.   . furosemide (LASIX)  20 MG tablet Take 20 mg by mouth as needed for edema (to be given with the potassium 10 meq).   . hydrocortisone cream 1 % Apply 1 application topically 2 (two) times daily. TO HEMORRHOIDS  . levothyroxine (SYNTHROID, LEVOTHROID) 25 MCG tablet Take 25 mcg by mouth daily before breakfast.   . Lidocaine (ASPERCREME LIDOCAINE) 4 % PTCH Apply 1 patch topically at bedtime. APPLY TO LOWER BACK AND REMOVE IN THE MORNING  . memantine (NAMENDA XR) 28 MG CP24 24 hr capsule Take 28 mg by mouth.  . Nutritional Supplements (BENECALORIE PO) Take 1.5 oz by mouth 2 (two) times daily.   . ondansetron (ZOFRAN) 4 MG tablet Take 1 tablet (4 mg total) by mouth every 6 (six) hours as needed for nausea.  . polyethylene glycol (MIRALAX / GLYCOLAX) packet Take 17 g by mouth every other day. For constipation and hold for loose stools  . polyvinyl alcohol (LIQUIFILM TEARS) 1.4 % ophthalmic solution Place 1 drop into both eyes 4 (four) times daily.   . potassium chloride (K-DUR,KLOR-CON) 10 MEQ tablet Take 10 mEq by mouth as needed (for edema/if taking edema).   . warfarin (COUMADIN) 3 MG tablet Take 3 mg by mouth daily.   No facility-administered encounter medications on file as of 11/11/2016.      Review of Systems  Unable to perform ROS: Dementia (obtained mostly from charge nurse)  Constitutional: Negative for fever.  HENT: Positive for trouble swallowing. Negative for nosebleeds.   Respiratory: Positive for cough and shortness of breath.   Cardiovascular: Negative for chest pain.  Gastrointestinal: Negative for abdominal pain, blood in stool and vomiting.  Genitourinary: Negative for hematuria.       Has urinary incontience  Musculoskeletal: Positive for gait problem.       Denies pain this visit  Hematological: Bruises/bleeds easily.  Psychiatric/Behavioral: Positive for confusion.       Has advanced dementia    Vitals:   11/11/16 1406  BP: 140/76  Pulse: 88  Resp: 20  Temp: 99 F (37.2 C)  TempSrc: Oral  SpO2: 97%  Weight: 123 lb (55.8 kg)  Height: 5' (1.524 m)   Body mass index is 24.02 kg/m. Physical Exam  Constitutional: She appears well-developed and well-nourished. No distress.  HENT:  Head: Normocephalic and atraumatic.  Mouth/Throat: Oropharynx is clear and moist.  Neck: Normal range of motion. Neck supple.  Cardiovascular: Normal rate, regular rhythm and intact distal pulses.   Murmur heard. Pulmonary/Chest: Effort normal. No respiratory distress. She has no wheezes. She has no rales.   Decreased air entry into lung bases  Abdominal: Soft. Bowel sounds are normal. She exhibits no distension. There is no tenderness. There is no rebound.  Musculoskeletal:  Limited ROM with left hip due to pain, trace leg edema to left leg upto thigh area  Lymphadenopathy:    She has no cervical adenopathy.  Neurological: She is alert.  Skin: Skin is warm and dry. She is not diaphoretic.  Resolving bruise to arms and legs. No ecchymoses noted, no hematoma noted. aquacel dressing to left hip    Psychiatric:  Pleasantly confused    Labs reviewed: Basic Metabolic Panel:  Recent Labs  10/31/16 0236 11/01/16 0353 11/02/16 0445 11/11/16  NA 140 140 139  142  K 3.7 3.8 3.9 3.6  CL 112* 113* 112*  --   CO2 21* 21* 20*  --   GLUCOSE 108* 149* 110*  --   BUN 27* 32* 24* 19  CREATININE  0.90 0.90 0.80 0.8  CALCIUM 8.2* 8.3* 8.3*  --    Liver Function Tests:  Recent Labs  06/10/16 06/29/16 08/31/16 10/28/16 0329  AST 24 29 19   --   ALT 11 14 12   --   ALKPHOS 67 50 70  --   ALBUMIN  --   --   --  3.0*   No results for input(s): LIPASE, AMYLASE in the last 8760 hours. No results for input(s): AMMONIA in the last 8760 hours. CBC:  Recent Labs  10/27/16 1715  11/01/16 0353 11/02/16 0445 11/03/16 0318 11/11/16  WBC 9.6  < > 6.0 7.4 8.4 6.4  NEUTROABS 8.4*  --   --   --   --   --   HGB 12.4  < > 10.0* 9.5* 10.3* 9.8*  HCT 39.1  < > 31.2* 29.8* 32.6* 29*  MCV 94.7  < > 94.8 96.1 95.0  --   PLT 174  < > 179 163 197 419*  < > = values in this interval not displayed. Cardiac Enzymes:  Recent Labs  10/28/16 1455 10/28/16 2228 10/29/16 0330  TROPONINI 0.09* 0.05* 0.04*   BNP: Invalid input(s): POCBNP No results found for: HGBA1C Lab Results  Component Value Date   TSH 2.048 10/27/2016   Lab Results  Component Value Date   [id number] 829 02/05/2012   No results found for: FOLATE No results found for: IRON, TIBC, FERRITIN  Imaging and Procedures obtained prior to SNF admission: Dg Chest 1 View  Result Date: 10/27/2016 CLINICAL DATA:  Fall from standing.  LEFT hip pain. EXAM: CHEST 1 VIEW COMPARISON:  Chest radiograph February 20, 2009 FINDINGS: Cardiac silhouette is moderately enlarged. Calcified aortic knob. Similar chronic interstitial changes. Hyperinflation. Calcified granulomas. No pleural effusion or focal consolidation. Apical pleural thickening is similar. No pneumothorax. Osteopenia. Old T8 and T11 compression fractures. IMPRESSION: Stable cardiomegaly and COPD. Aortic Atherosclerosis (ICD10-I70.0). Electronically Signed   By: Elon Alas M.D.   On: 10/27/2016 18:14   Ct Head Wo Contrast  Result Date:  10/27/2016 CLINICAL DATA:  Fall from standing position with left hip fracture EXAM: CT HEAD WITHOUT CONTRAST CT CERVICAL SPINE WITHOUT CONTRAST TECHNIQUE: Multidetector CT imaging of the head and cervical spine was performed following the standard protocol without intravenous contrast. Multiplanar CT image reconstructions of the cervical spine were also generated. COMPARISON:  11/10/2013 FINDINGS: CT HEAD FINDINGS Brain: No acute territorial infarction, hemorrhage, or intracranial mass is seen. Marked atrophy. Mild small vessel ischemic changes of the white matter. Stable small left sub insula hypodensities. Stable enlarged ventricles, felt secondary to atrophy. Vascular: No hyperdense vessels. Carotid artery calcifications. Vertebral artery calcification. Skull: No fracture Sinuses/Orbits: Mild mucosal thickening within the ethmoid sinuses. No acute orbital abnormality. Other: None CT CERVICAL SPINE FINDINGS Alignment: Trace retrolisthesis of C5 on C6, unchanged. Trace anterolisthesis C3 and C4 unchanged. Facet alignment within normal limits. Skull base and vertebrae: No acute fracture. No primary bone lesion or focal pathologic process. Soft tissues and spinal canal: No prevertebral fluid or swelling. No visible canal hematoma. Disc levels: Moderate severe degenerative changes at C4-C5, C5-C6 and C6-C7. Bilateral foraminal stenosis C4 through C7, most notable at C5 C6. Upper chest: Apical scarring.  Carotid artery calcification. Other: None IMPRESSION: 1. No CT evidence for acute intracranial abnormality. Atrophy and small vessel ischemic changes of the white matter 2. Stable cervical spine alignment with degenerative changes. No acute osseous abnormality Electronically Signed   By: Donavan Foil M.D.   On:  10/27/2016 18:54   Ct Cervical Spine Wo Contrast  Result Date: 10/27/2016 CLINICAL DATA:  Fall from standing position with left hip fracture EXAM: CT HEAD WITHOUT CONTRAST CT CERVICAL SPINE WITHOUT CONTRAST  TECHNIQUE: Multidetector CT imaging of the head and cervical spine was performed following the standard protocol without intravenous contrast. Multiplanar CT image reconstructions of the cervical spine were also generated. COMPARISON:  11/10/2013 FINDINGS: CT HEAD FINDINGS Brain: No acute territorial infarction, hemorrhage, or intracranial mass is seen. Marked atrophy. Mild small vessel ischemic changes of the white matter. Stable small left sub insula hypodensities. Stable enlarged ventricles, felt secondary to atrophy. Vascular: No hyperdense vessels. Carotid artery calcifications. Vertebral artery calcification. Skull: No fracture Sinuses/Orbits: Mild mucosal thickening within the ethmoid sinuses. No acute orbital abnormality. Other: None CT CERVICAL SPINE FINDINGS Alignment: Trace retrolisthesis of C5 on C6, unchanged. Trace anterolisthesis C3 and C4 unchanged. Facet alignment within normal limits. Skull base and vertebrae: No acute fracture. No primary bone lesion or focal pathologic process. Soft tissues and spinal canal: No prevertebral fluid or swelling. No visible canal hematoma. Disc levels: Moderate severe degenerative changes at C4-C5, C5-C6 and C6-C7. Bilateral foraminal stenosis C4 through C7, most notable at C5 C6. Upper chest: Apical scarring.  Carotid artery calcification. Other: None IMPRESSION: 1. No CT evidence for acute intracranial abnormality. Atrophy and small vessel ischemic changes of the white matter 2. Stable cervical spine alignment with degenerative changes. No acute osseous abnormality Electronically Signed   By: Donavan Foil M.D.   On: 10/27/2016 18:54   Dg Hip Unilat With Pelvis 2-3 Views Left  Result Date: 10/27/2016 CLINICAL DATA:  Fall EXAM: DG HIP (WITH OR WITHOUT PELVIS) 2-3V LEFT COMPARISON:  None. FINDINGS: Fracture left femoral neck with mild displacement. Normal left hip joint Chronic fracture right pubic rami. Right hip hemiarthroplasty. Lumbar disc degeneration  IMPRESSION: Displaced fracture left femoral neck Electronically Signed   By: Franchot Gallo M.D.   On: 10/27/2016 18:13   CXR 11/02/16 IMPRESSION:Stable cardiomegaly and small to moderate bilateral pleural effusions. Mild pulmonary vascular congestion  Echocardiogram 10/28/16 LVEF 55-60%, mild LVH   Assessment/Plan  Left leg edema From stasis from recent surgery and immobilization. To keep legs elevated at rest. If worsens, consider ted stockings. No signs of infection or DVT. inr supratherapeutic.   supratherapeutic inr inr today 4.2. Currently on warfarin 3 mg daily. Hold warfarin for today. No signs of bleeding. Check inr 11/12/16. Goal inr 2-3 with her history of afib  afib Controlled HR, continue diltiazem, hold warfarin as above for now  Blood loss anemia Drop in h&h from hospital. No bleed reported. Currently on warfarin with elevated inr as well. Hold warfarin. monitor for bleed. Check h&h in 1 week.   Family/ staff Communication: reviewed care plan with patient and charge nurse.    Labs/tests ordered: cbc in 1 week, inr 11/12/16  Blanchie Serve, MD Internal Medicine [facility] [address] [place], [place] [address] Cell Phone (Monday-Friday 8 am - 5 pm): [phone] On Call: [phone] and follow prompts after 5 pm and on weekends Office Phone: [phone] Office Fax: [phone]

## 2016-11-15 ENCOUNTER — Encounter: Payer: Self-pay | Admitting: Family

## 2016-11-15 ENCOUNTER — Non-Acute Institutional Stay (SKILLED_NURSING_FACILITY): Payer: PPO | Admitting: Family

## 2016-11-15 DIAGNOSIS — R638 Other symptoms and signs concerning food and fluid intake: Secondary | ICD-10-CM | POA: Diagnosis not present

## 2016-11-15 DIAGNOSIS — R05 Cough: Secondary | ICD-10-CM | POA: Diagnosis not present

## 2016-11-15 DIAGNOSIS — R059 Cough, unspecified: Secondary | ICD-10-CM

## 2016-11-15 NOTE — Progress Notes (Signed)
Location:  [facility] Room Number: [place]:  SNF (31) Provider: Terique Kawabata FNP-C  Blanchie Serve, MD  Patient Care Team: Blanchie Serve, MD as PCP - General (Internal Medicine) Mast, Man X, NP as Nurse Practitioner (Nurse Practitioner) Wilford Corner, MD as Consulting Physician (Gastroenterology) Sanda Klein, MD as Consulting Physician (Cardiology)  Extended Emergency Contact Information Primary Emergency Contact: [name],Pat Address: [place] [address] [name] [place] Phone: [phone] Mobile Phone: [phone] Relation: Daughter  Code Status:  DNR Goals of care: Advanced Directive information Advanced Directives 11/15/2016  Does Patient Have a Medical Advance Directive? Yes  Type of Paramedic of [place];Out of facility DNR (pink MOST or yellow form)  Does patient want to make changes to medical advance directive? No - Patient declined  Copy of [place] in Chart? Yes  Pre-existing out of facility DNR order (yellow form or pink MOST form) Yellow form placed in chart (order not valid for inpatient use)     Chief Complaint  Patient presents with  . Acute Visit    cough with yellowish sputum     HPI:  Pt is a 83 y.o. female seen today at [facility] for an acute visit for evaluation of cough. She is seen in her room today with facility nurse present at bedside.Nurse reports patient has had decreased oral intake,not taking medication and coughing yellow sputum. Patient unable to provide HPI and ROS due to her cognitive impairment. No fever,chills, wheezing or shortness of breath reported by nurse.   Past Medical History:  Diagnosis Date  . Anemia, unspecified   . Anorexia   . Anxiety   . Arthritis   . Atherosclerotic cerebrovascular disease   . Backache, unspecified   . Cataract   . Cholelithiasis   . Chronic kidney disease,  unspecified   . Closed fracture of right inferior pubic ramus ([place]) 11/12/2013   11/10/13 s/p fall, ED eval  X-ray R hip  1. Possible nondisplaced right inferior pubic ramus fracture.  2. No femur fracture or dislocation   02/06/14 dc prn Motrin and Norco-not used    . Depression   . Diaphragmatic hernia without mention of obstruction or gangrene   . Diseases of lips 03/26/2013  . Diverticulosis of colon (without mention of hemorrhage)   . Hemorrhage of rectum and anus 03/26/2013  . Hyperlipidemia   . Hypertension   . Insomnia, unspecified   . Laceration of occipital scalp 11/12/2013  . Macular degeneration 06/28/2013  . Macular degeneration (senile) of retina, unspecified   . Osteoporosis   . Other malaise and fatigue   . Other sleep disturbances   . Other specified cardiac dysrhythmias(427.89)   . Other specified personal history presenting hazards to health(V15.89)   . Other symptoms involving cardiovascular system   . Pancreatitis   . Personal history of other diseases of circulatory system   . Personal history of other diseases of digestive system    pancreatitis from gallstones  . Personal history of other diseases of respiratory system   . Personal history of traumatic fracture   . Rosacea   . Senile osteoporosis    with old T11 fracture  . Thyroid disease   . Unspecified disorders of arteries and arterioles   . Unspecified hearing loss   . Unspecified hemorrhoids without mention of complication    Past Surgical History:  Procedure Laterality Date  .  ABDOMINAL HYSTERECTOMY     and BSO fibroids  . COLONOSCOPY  05/23/2007  . EYE LID LIFT Right 09/2016   OD  . HIP ARTHROPLASTY Left 10/31/2016   Procedure: ARTHROPLASTY  HIP (HEMIARTHROPLASTY);  Surgeon: Paralee Cancel, MD;  Location: [facility];  Service: Orthopedics;  Laterality: Left;  . JOINT REPLACEMENT  1996  . TOTAL HIP ARTHROPLASTY Right     Allergies  Allergen Reactions  . Ace Inhibitors Cough  . Mirtazapine Other (See  Comments)    Nightmares   . Penicillins Rash    Has patient had a PCN reaction causing immediate rash, facial/tongue/throat swelling, SOB or lightheadedness with hypotension: Yes Has patient had a PCN reaction causing severe rash involving mucus membranes or skin necrosis: Unknown Has patient had a PCN reaction that required hospitalization: Unknown Has patient had a PCN reaction occurring within the last 10 years: Unknown If all of the above answers are "NO", then may proceed with Cephalosporin use.     Outpatient Encounter Prescriptions as of 11/15/2016  Medication Sig  . acetaminophen (TYLENOL) 325 MG tablet Take 2 tablets (650 mg total) by mouth every 6 (six) hours as needed for mild pain (or Fever >/= 101).  Marland Kitchen alum & mag hydroxide-simeth (MAALOX/MYLANTA) 200-200-20 MG/5ML suspension Take 30 mLs by mouth every 4 (four) hours as needed for indigestion.  Marland Kitchen BYSTOLIC 10 MG tablet Take 10 mg by mouth daily.   . cholecalciferol 2000 units TABS Take 1 tablet (2,000 Units total) by mouth daily.  . clindamycin (CLEOCIN) 150 MG capsule Take 600 mg by mouth See admin instructions. ONE HOUR PRIOR TO DENTAL APPOINTMENTS  . diltiazem (CARDIZEM) 30 MG tablet Take 30 mg by mouth 4 (four) times daily.  Marland Kitchen escitalopram (LEXAPRO) 5 MG tablet Take 5 mg by mouth every other day.   . furosemide (LASIX) 20 MG tablet Take 20 mg by mouth as needed for edema (to be given with the potassium 10 meq).   . hydrocortisone cream 1 % Apply 1 application topically 2 (two) times daily. TO HEMORRHOIDS  . levothyroxine (SYNTHROID, LEVOTHROID) 25 MCG tablet Take 25 mcg by mouth daily before breakfast.   . Lidocaine (ASPERCREME LIDOCAINE) 4 % PTCH Apply 1 patch topically at bedtime. APPLY TO LOWER BACK AND REMOVE IN THE MORNING  . memantine (NAMENDA XR) 28 MG CP24 24 hr capsule Take 28 mg by mouth.  . Nutritional Supplements (BENECALORIE PO) Take 1.5 oz by mouth 2 (two) times daily.   . ondansetron (ZOFRAN) 4 MG tablet Take 1  tablet (4 mg total) by mouth every 6 (six) hours as needed for nausea.  . polyethylene glycol (MIRALAX / GLYCOLAX) packet Take 17 g by mouth every other day. For constipation and hold for loose stools  . polyvinyl alcohol (LIQUIFILM TEARS) 1.4 % ophthalmic solution Place 1 drop into both eyes 4 (four) times daily.   . potassium chloride (K-DUR,KLOR-CON) 10 MEQ tablet Take 10 mEq by mouth as needed (for edema/if taking edema).   . warfarin (COUMADIN) 1 MG tablet Take 1.5 mg by mouth daily. Give 1 tablet and 1/2 = 1.5 mg  . [DISCONTINUED] warfarin (COUMADIN) 3 MG tablet Take 3 mg by mouth daily.   No facility-administered encounter medications on file as of 11/15/2016.     Review of Systems  Unable to perform ROS: Dementia    Immunization History  Administered Date(s) Administered  . DTaP 10/12/2012  . Influenza-Unspecified 11/22/2012, 12/26/2013, 11/12/2014, 12/09/2015  . PPD Test 12/06/2013  . Pneumococcal Polysaccharide-23  09/22/2000  . Td 07/08/2005  . Tdap 11/10/2013   Pertinent  Health Maintenance Due  Topic Date Due  . INFLUENZA VACCINE  11/22/2016 (Originally 09/22/2016)  . PNA vac Low Risk Adult (2 of 2 - PCV13) 02/22/2017 (Originally 09/22/2001)  . DEXA SCAN  Completed   Fall Risk  10/22/2016 01/13/2015 06/28/2013  Falls in the past year? No No No  Risk for fall due to : - Impaired balance/gait -    Vitals:   11/15/16 1141  BP: 130/76  Pulse: 88  Resp: 20  Temp: 99.2 F (37.3 C)  SpO2: 98%  Weight: 119 lb 4.8 oz (54.1 kg)  Height: 5' (1.524 m)   Body mass index is 23.3 kg/m. Physical Exam  Constitutional: She appears well-developed and well-nourished.  Elderly in no acute distress hold and talking to her baby doll during visit  HENT:  Head: Normocephalic.  Right Ear: External ear normal.  Left Ear: External ear normal.  Mouth/Throat: Oropharynx is clear and moist. No oropharyngeal exudate.  Eyes: Pupils are equal, round, and reactive to light. Conjunctivae and  EOM are normal. Right eye exhibits no discharge. Left eye exhibits no discharge. No scleral icterus.  Neck: Normal range of motion. No JVD present. No thyromegaly present.  Cardiovascular: Normal rate, regular rhythm, normal heart sounds and intact distal pulses.  Exam reveals no gallop and no friction rub.   No murmur heard. Pulmonary/Chest: Effort normal. No respiratory distress. She has no wheezes. She has no rales. She exhibits no tenderness.  Diminished breath sounds to bases   Abdominal: Soft. Bowel sounds are normal. She exhibits no distension and no mass. There is no tenderness. There is no rebound and no guarding.  Musculoskeletal: She exhibits no edema or tenderness.  Moves x 4 extremities without any difficulties. Wheelchair bound   Lymphadenopathy:    She has no cervical adenopathy.  Neurological: Coordination normal.  Confused at her baseline.   Skin: Skin is warm and dry. No rash noted. No erythema. No pallor.  Left hip Surgical incision Aquacel dressing dry, clean and intact. surrounding skin tissue without any signs of infections.   Psychiatric: She has a normal mood and affect.    Labs reviewed:  Recent Labs  10/31/16 0236 11/01/16 0353 11/02/16 0445 11/11/16  NA 140 140 139 142  K 3.7 3.8 3.9 3.6  CL 112* 113* 112*  --   CO2 21* 21* 20*  --   GLUCOSE 108* 149* 110*  --   BUN 27* 32* 24* 19  CREATININE 0.90 0.90 0.80 0.8  CALCIUM 8.2* 8.3* 8.3*  --     Recent Labs  06/10/16 06/29/16 08/31/16 10/28/16 0329  AST 24 29 19   --   ALT 11 14 12   --   ALKPHOS 67 50 70  --   ALBUMIN  --   --   --  3.0*    Recent Labs  10/27/16 1715  11/01/16 0353 11/02/16 0445 11/03/16 0318 11/11/16  WBC 9.6  < > 6.0 7.4 8.4 6.4  NEUTROABS 8.4*  --   --   --   --   --   HGB 12.4  < > 10.0* 9.5* 10.3* 9.8*  HCT 39.1  < > 31.2* 29.8* 32.6* 29*  MCV 94.7  < > 94.8 96.1 95.0  --   PLT 174  < > 179 163 197 419*  < > = values in this interval not displayed. Lab Results    Component Value Date  TSH 2.048 10/27/2016    Significant Diagnostic Results in last 30 days:  Dg Chest 1 View  Result Date: 10/27/2016 CLINICAL DATA:  Fall from standing.  LEFT hip pain. EXAM: CHEST 1 VIEW COMPARISON:  Chest radiograph February 20, 2009 FINDINGS: Cardiac silhouette is moderately enlarged. Calcified aortic knob. Similar chronic interstitial changes. Hyperinflation. Calcified granulomas. No pleural effusion or focal consolidation. Apical pleural thickening is similar. No pneumothorax. Osteopenia. Old T8 and T11 compression fractures. IMPRESSION: Stable cardiomegaly and COPD. Aortic Atherosclerosis (ICD10-I70.0). Electronically Signed   By: Elon Alas M.D.   On: 10/27/2016 18:14   Ct Head Wo Contrast  Result Date: 10/27/2016 CLINICAL DATA:  Fall from standing position with left hip fracture EXAM: CT HEAD WITHOUT CONTRAST CT CERVICAL SPINE WITHOUT CONTRAST TECHNIQUE: Multidetector CT imaging of the head and cervical spine was performed following the standard protocol without intravenous contrast. Multiplanar CT image reconstructions of the cervical spine were also generated. COMPARISON:  11/10/2013 FINDINGS: CT HEAD FINDINGS Brain: No acute territorial infarction, hemorrhage, or intracranial mass is seen. Marked atrophy. Mild small vessel ischemic changes of the white matter. Stable small left sub insula hypodensities. Stable enlarged ventricles, felt secondary to atrophy. Vascular: No hyperdense vessels. Carotid artery calcifications. Vertebral artery calcification. Skull: No fracture Sinuses/Orbits: Mild mucosal thickening within the ethmoid sinuses. No acute orbital abnormality. Other: None CT CERVICAL SPINE FINDINGS Alignment: Trace retrolisthesis of C5 on C6, unchanged. Trace anterolisthesis C3 and C4 unchanged. Facet alignment within normal limits. Skull base and vertebrae: No acute fracture. No primary bone lesion or focal pathologic process. Soft tissues and spinal canal: No  prevertebral fluid or swelling. No visible canal hematoma. Disc levels: Moderate severe degenerative changes at C4-C5, C5-C6 and C6-C7. Bilateral foraminal stenosis C4 through C7, most notable at C5 C6. Upper chest: Apical scarring.  Carotid artery calcification. Other: None IMPRESSION: 1. No CT evidence for acute intracranial abnormality. Atrophy and small vessel ischemic changes of the white matter 2. Stable cervical spine alignment with degenerative changes. No acute osseous abnormality Electronically Signed   By: Donavan Foil M.D.   On: 10/27/2016 18:54   Ct Cervical Spine Wo Contrast  Result Date: 10/27/2016 CLINICAL DATA:  Fall from standing position with left hip fracture EXAM: CT HEAD WITHOUT CONTRAST CT CERVICAL SPINE WITHOUT CONTRAST TECHNIQUE: Multidetector CT imaging of the head and cervical spine was performed following the standard protocol without intravenous contrast. Multiplanar CT image reconstructions of the cervical spine were also generated. COMPARISON:  11/10/2013 FINDINGS: CT HEAD FINDINGS Brain: No acute territorial infarction, hemorrhage, or intracranial mass is seen. Marked atrophy. Mild small vessel ischemic changes of the white matter. Stable small left sub insula hypodensities. Stable enlarged ventricles, felt secondary to atrophy. Vascular: No hyperdense vessels. Carotid artery calcifications. Vertebral artery calcification. Skull: No fracture Sinuses/Orbits: Mild mucosal thickening within the ethmoid sinuses. No acute orbital abnormality. Other: None CT CERVICAL SPINE FINDINGS Alignment: Trace retrolisthesis of C5 on C6, unchanged. Trace anterolisthesis C3 and C4 unchanged. Facet alignment within normal limits. Skull base and vertebrae: No acute fracture. No primary bone lesion or focal pathologic process. Soft tissues and spinal canal: No prevertebral fluid or swelling. No visible canal hematoma. Disc levels: Moderate severe degenerative changes at C4-C5, C5-C6 and C6-C7.  Bilateral foraminal stenosis C4 through C7, most notable at C5 C6. Upper chest: Apical scarring.  Carotid artery calcification. Other: None IMPRESSION: 1. No CT evidence for acute intracranial abnormality. Atrophy and small vessel ischemic changes of the white matter 2. Stable cervical spine  alignment with degenerative changes. No acute osseous abnormality Electronically Signed   By: Donavan Foil M.D.   On: 10/27/2016 18:54   Pelvis Portable  Result Date: 10/31/2016 CLINICAL DATA:  Arthroplasty left hip. EXAM: PORTABLE PELVIS 1-2 VIEWS COMPARISON:  Plain film of the pelvis dated 08/25/2009. FINDINGS: Status post left hip arthroplasty. Hardware appears intact and appropriately position. Right hip arthroplasty is stable in position. Overall osseous alignment appears anatomic. Expected postsurgical changes within the soft tissues about the left hip. IMPRESSION: Status post left hip arthroplasty. Hardware appears intact and appropriately positioned. No evidence of surgical complicating feature. Electronically Signed   By: Franki Cabot M.D.   On: 10/31/2016 12:08   Dg Chest Port 1 View  Result Date: 11/02/2016 CLINICAL DATA:  Hypoxia. EXAM: PORTABLE CHEST 1 VIEW COMPARISON:  Chest x-ray from yesterday. FINDINGS: Stable cardiomegaly. Mild pulmonary vascular congestion. Unchanged small to moderate bilateral pleural effusions. No pneumothorax. IMPRESSION: Stable cardiomegaly and small to moderate bilateral pleural effusions. Electronically Signed   By: Titus Dubin M.D.   On: 11/02/2016 09:26   Dg Chest Port 1 View  Result Date: 11/01/2016 CLINICAL DATA:  Hypoxia EXAM: PORTABLE CHEST 1 VIEW COMPARISON:  10/28/2014 FINDINGS: New small to moderate bilateral pleural effusions with central vascular congestion and pulmonary vascular redistribution consistent with CHF. Stable cardiomegaly with aortic atherosclerosis. Calcified nodule the right upper lobe. Old T8 and T11 compression fractures. IMPRESSION: 1. Mild  CHF with small to moderate bilateral pleural effusions. 2. Chronic stable T8 and T11 compression fractures of the thoracic spine. 3. Granuloma in the right upper lobe, stable appearance Electronically Signed   By: Ashley Royalty M.D.   On: 11/01/2016 20:47   Dg Hip Unilat With Pelvis 2-3 Views Left  Result Date: 10/27/2016 CLINICAL DATA:  Fall EXAM: DG HIP (WITH OR WITHOUT PELVIS) 2-3V LEFT COMPARISON:  None. FINDINGS: Fracture left femoral neck with mild displacement. Normal left hip joint Chronic fracture right pubic rami. Right hip hemiarthroplasty. Lumbar disc degeneration IMPRESSION: Displaced fracture left femoral neck Electronically Signed   By: Franchot Gallo M.D.   On: 10/27/2016 18:13   Assessment/Plan 1. Cough Afebrile.coughing yellow sputum.Diminished breath sounds on exam. No shortness of breath or wheezing.Obtain portable CXR Pa/Lat to rule Pneumonia. Continue to monitor.   2. Decreased oral intake Has had poor oral intake and refuses to swallow medication. Follows up with speech therapy. Continue to encourage oral intake.Aspiration precautions.   Family/ staff Communication: Reviewed plan of care with patient and facility Nurse.   Labs/tests ordered: Portable CXR Pa/Lat to rule Pneumonia.  Sandrea Hughs, NP

## 2016-11-16 ENCOUNTER — Non-Acute Institutional Stay (SKILLED_NURSING_FACILITY): Payer: PPO | Admitting: Internal Medicine

## 2016-11-16 ENCOUNTER — Encounter: Payer: Self-pay | Admitting: Internal Medicine

## 2016-11-16 DIAGNOSIS — J189 Pneumonia, unspecified organism: Secondary | ICD-10-CM

## 2016-11-16 DIAGNOSIS — M25552 Pain in left hip: Secondary | ICD-10-CM

## 2016-11-16 DIAGNOSIS — J181 Lobar pneumonia, unspecified organism: Secondary | ICD-10-CM | POA: Diagnosis not present

## 2016-11-16 DIAGNOSIS — S72002S Fracture of unspecified part of neck of left femur, sequela: Secondary | ICD-10-CM

## 2016-11-16 DIAGNOSIS — R1312 Dysphagia, oropharyngeal phase: Secondary | ICD-10-CM

## 2016-11-16 DIAGNOSIS — Z5181 Encounter for therapeutic drug level monitoring: Secondary | ICD-10-CM | POA: Diagnosis not present

## 2016-11-16 LAB — VITAMIN D 25 HYDROXY (VIT D DEFICIENCY, FRACTURES): Vit D, 25-Hydroxy: 25

## 2016-11-16 NOTE — Progress Notes (Signed)
Location:  [facility] Room Number: [place]:  SNF [phone]) Provider:  Blanchie Serve, MD  Blanchie Serve, MD  Patient Care Team: Blanchie Serve, MD as PCP - General (Internal Medicine) Mast, Man X, NP as Nurse Practitioner (Nurse Practitioner) Wilford Corner, MD as Consulting Physician (Gastroenterology) Sanda Klein, MD as Consulting Physician (Cardiology)  Extended Emergency Contact Information Primary Emergency Contact: [name],Pat Address: [place] [address] [name] [place] Phone: [phone] Mobile Phone: [phone] Relation: Daughter  Code Status:  DNR Goals of care: Advanced Directive information Advanced Directives 11/16/2016  Does Patient Have a Medical Advance Directive? Yes  Type of Paramedic of [place];Out of facility DNR (pink MOST or yellow form)  Does patient want to make changes to medical advance directive? No - Patient declined  Copy of [place] in Chart? Yes  Pre-existing out of facility DNR order (yellow form or pink MOST form) Yellow form placed in chart (order not valid for inpatient use)     Chief Complaint  Patient presents with  . Acute Visit    Cough.     HPI:  Pt is a 83 y.o. female seen today for an acute visit for worsening left hip pain, cough, fever. She has advanced dementia and does not participate in HPI and ROS   Left hip pain- post fall with left femoral neck fracture s/p ORIF. Has been refusing to bear weight on left leg. Getting transferred via hoyer lift. Has not been asking for her pain med.   Cough- with temp 99.2 yesterday. Chest xray was obtained due to fever with cough and she has left lung base infiltrate suggestive of pneumonia. She is afebrile at present. She needs assistance with feeding and has poor po intake   Past Medical History:  Diagnosis Date  . Anemia, unspecified   . Anorexia   . Anxiety   .  Arthritis   . Atherosclerotic cerebrovascular disease   . Backache, unspecified   . Cataract   . Cholelithiasis   . Chronic kidney disease, unspecified   . Closed fracture of right inferior pubic ramus ([place]) 11/12/2013   11/10/13 s/p fall, ED eval  X-ray R hip  1. Possible nondisplaced right inferior pubic ramus fracture.  2. No femur fracture or dislocation   02/06/14 dc prn Motrin and Norco-not used    . Depression   . Diaphragmatic hernia without mention of obstruction or gangrene   . Diseases of lips 03/26/2013  . Diverticulosis of colon (without mention of hemorrhage)   . Hemorrhage of rectum and anus 03/26/2013  . Hyperlipidemia   . Hypertension   . Insomnia, unspecified   . Laceration of occipital scalp 11/12/2013  . Macular degeneration 06/28/2013  . Macular degeneration (senile) of retina, unspecified   . Osteoporosis   . Other malaise and fatigue   . Other sleep disturbances   . Other specified cardiac dysrhythmias(427.89)   . Other specified personal history presenting hazards to health(V15.89)   . Other symptoms involving cardiovascular system   . Pancreatitis   . Personal history of other diseases of circulatory system   . Personal history of other diseases of digestive system    pancreatitis from gallstones  . Personal history of other diseases of respiratory system   . Personal history of traumatic fracture   . Rosacea   . Senile osteoporosis    with old T11 fracture  .  Thyroid disease   . Unspecified disorders of arteries and arterioles   . Unspecified hearing loss   . Unspecified hemorrhoids without mention of complication    Past Surgical History:  Procedure Laterality Date  . ABDOMINAL HYSTERECTOMY     and BSO fibroids  . COLONOSCOPY  05/23/2007  . EYE LID LIFT Right 09/2016   OD  . HIP ARTHROPLASTY Left 10/31/2016   Procedure: ARTHROPLASTY  HIP (HEMIARTHROPLASTY);  Surgeon: Paralee Cancel, MD;  Location: [facility];  Service: Orthopedics;  Laterality: Left;  . JOINT  REPLACEMENT  1996  . TOTAL HIP ARTHROPLASTY Right     Allergies  Allergen Reactions  . Ace Inhibitors Cough  . Mirtazapine Other (See Comments)    Nightmares   . Penicillins Rash    Has patient had a PCN reaction causing immediate rash, facial/tongue/throat swelling, SOB or lightheadedness with hypotension: Yes Has patient had a PCN reaction causing severe rash involving mucus membranes or skin necrosis: Unknown Has patient had a PCN reaction that required hospitalization: Unknown Has patient had a PCN reaction occurring within the last 10 years: Unknown If all of the above answers are "NO", then may proceed with Cephalosporin use.     Outpatient Encounter Prescriptions as of 11/16/2016  Medication Sig  . acetaminophen (TYLENOL) 325 MG tablet Take 2 tablets (650 mg total) by mouth every 6 (six) hours as needed for mild pain (or Fever >/= 101).  Marland Kitchen alum & mag hydroxide-simeth (MAALOX/MYLANTA) 200-200-20 MG/5ML suspension Take 30 mLs by mouth every 4 (four) hours as needed for indigestion.  Marland Kitchen BYSTOLIC 10 MG tablet Take 10 mg by mouth daily.   . cholecalciferol 2000 units TABS Take 1 tablet (2,000 Units total) by mouth daily.  . clindamycin (CLEOCIN) 150 MG capsule Take 600 mg by mouth See admin instructions. ONE HOUR PRIOR TO DENTAL APPOINTMENTS  . dextromethorphan-guaiFENesin (MUCINEX DM) 30-600 MG 12hr tablet Take 1 tablet by mouth 2 (two) times daily. Stop date 11/22/16  . diltiazem (CARDIZEM) 30 MG tablet Take 30 mg by mouth 4 (four) times daily.  Marland Kitchen escitalopram (LEXAPRO) 5 MG tablet Take 5 mg by mouth every other day.   . furosemide (LASIX) 20 MG tablet Take 20 mg by mouth as needed for edema (to be given with the potassium 10 meq).   . hydrocortisone cream 1 % Apply 1 application topically 2 (two) times daily. TO HEMORRHOIDS  . levothyroxine (SYNTHROID, LEVOTHROID) 25 MCG tablet Take 25 mcg by mouth daily before breakfast.   . Lidocaine (ASPERCREME LIDOCAINE) 4 % PTCH Apply 1 patch  topically at bedtime. APPLY TO LOWER BACK AND REMOVE IN THE MORNING  . memantine (NAMENDA XR) 28 MG CP24 24 hr capsule Take 28 mg by mouth.  . moxifloxacin (AVELOX) 400 MG tablet Take 400 mg by mouth daily at 8 pm. Stop date 11/21/16  . Nutritional Supplements (BENECALORIE PO) Take 1.5 oz by mouth 2 (two) times daily.   . ondansetron (ZOFRAN) 4 MG tablet Take 1 tablet (4 mg total) by mouth every 6 (six) hours as needed for nausea.  . polyethylene glycol (MIRALAX / GLYCOLAX) packet Take 17 g by mouth every other day. For constipation and hold for loose stools  . polyvinyl alcohol (LIQUIFILM TEARS) 1.4 % ophthalmic solution Place 1 drop into both eyes 4 (four) times daily.   . potassium chloride (K-DUR,KLOR-CON) 10 MEQ tablet Take 10 mEq by mouth as needed (for edema/if taking edema).   . saccharomyces boulardii (FLORASTOR) 250 MG capsule Take 250  mg by mouth 2 (two) times daily. Stop date 11/24/16  . warfarin (COUMADIN) 1 MG tablet Take 1.5 mg by mouth daily. Give 1 tablet and 1/2 = 1.5 mg   No facility-administered encounter medications on file as of 11/16/2016.     Review of Systems  Unable to perform ROS: Dementia    Immunization History  Administered Date(s) Administered  . DTaP 10/12/2012  . Influenza-Unspecified 11/22/2012, 12/26/2013, 11/12/2014, 12/09/2015  . PPD Test 12/06/2013  . Pneumococcal Polysaccharide-23 09/22/2000  . Td 07/08/2005  . Tdap 11/10/2013   Pertinent  Health Maintenance Due  Topic Date Due  . INFLUENZA VACCINE  11/22/2016 (Originally 09/22/2016)  . PNA vac Low Risk Adult (2 of 2 - PCV13) 02/22/2017 (Originally 09/22/2001)  . DEXA SCAN  Completed   Fall Risk  10/22/2016 01/13/2015 06/28/2013  Falls in the past year? No No No  Risk for fall due to : - Impaired balance/gait -   Functional Status Survey:    Vitals:   11/16/16 1506  BP: 140/70  Pulse: 90  Resp: 20  Temp: 98.4 F (36.9 C)  TempSrc: Oral  SpO2: 97%  Weight: 119 lb 4.8 oz (54.1 kg)  Height:  5' (1.524 m)   Body mass index is 23.3 kg/m.    Physical Exam  Constitutional:  Frail elderly female in no acute distress  HENT:  Head: Normocephalic and atraumatic.  Mouth/Throat: Oropharynx is clear and moist.  Eyes: Pupils are equal, round, and reactive to light.  Neck: Neck supple.  Cardiovascular: Normal rate and regular rhythm.   Murmur heard. Pulmonary/Chest:  Decreased air entry to lung bases, no wheeze or rhonchi  Abdominal: Soft. Bowel sounds are normal.  Musculoskeletal: She exhibits edema and tenderness.  Tenderness to palpation to left hip area, normal temp to touch  Lymphadenopathy:    She has no cervical adenopathy.  Skin: Skin is warm and dry.  Left hip aquacel dressing +    Labs reviewed:  Recent Labs  10/31/16 0236 11/01/16 0353 11/02/16 0445 11/11/16  NA 140 140 139 142  K 3.7 3.8 3.9 3.6  CL 112* 113* 112*  --   CO2 21* 21* 20*  --   GLUCOSE 108* 149* 110*  --   BUN 27* 32* 24* 19  CREATININE 0.90 0.90 0.80 0.8  CALCIUM 8.2* 8.3* 8.3*  --     Recent Labs  06/10/16 06/29/16 08/31/16 10/28/16 0329  AST 24 29 19   --   ALT 11 14 12   --   ALKPHOS 67 50 70  --   ALBUMIN  --   --   --  3.0*    Recent Labs  10/27/16 1715  11/01/16 0353 11/02/16 0445 11/03/16 0318 11/11/16  WBC 9.6  < > 6.0 7.4 8.4 6.4  NEUTROABS 8.4*  --   --   --   --   --   HGB 12.4  < > 10.0* 9.5* 10.3* 9.8*  HCT 39.1  < > 31.2* 29.8* 32.6* 29*  MCV 94.7  < > 94.8 96.1 95.0  --   PLT 174  < > 179 163 197 419*  < > = values in this interval not displayed. Lab Results  Component Value Date   TSH 2.048 10/27/2016   No results found for: HGBA1C No results found for: CHOL, HDL, LDLCALC, LDLDIRECT, TRIG, CHOLHDL  Significant Diagnostic Results in last 30 days:  Dg Chest 1 View  Result Date: 10/27/2016 CLINICAL DATA:  Fall from standing.  LEFT  hip pain. EXAM: CHEST 1 VIEW COMPARISON:  Chest radiograph February 20, 2009 FINDINGS: Cardiac silhouette is moderately  enlarged. Calcified aortic knob. Similar chronic interstitial changes. Hyperinflation. Calcified granulomas. No pleural effusion or focal consolidation. Apical pleural thickening is similar. No pneumothorax. Osteopenia. Old T8 and T11 compression fractures. IMPRESSION: Stable cardiomegaly and COPD. Aortic Atherosclerosis (ICD10-I70.0). Electronically Signed   By: Elon Alas M.D.   On: 10/27/2016 18:14   Ct Head Wo Contrast  Result Date: 10/27/2016 CLINICAL DATA:  Fall from standing position with left hip fracture EXAM: CT HEAD WITHOUT CONTRAST CT CERVICAL SPINE WITHOUT CONTRAST TECHNIQUE: Multidetector CT imaging of the head and cervical spine was performed following the standard protocol without intravenous contrast. Multiplanar CT image reconstructions of the cervical spine were also generated. COMPARISON:  11/10/2013 FINDINGS: CT HEAD FINDINGS Brain: No acute territorial infarction, hemorrhage, or intracranial mass is seen. Marked atrophy. Mild small vessel ischemic changes of the white matter. Stable small left sub insula hypodensities. Stable enlarged ventricles, felt secondary to atrophy. Vascular: No hyperdense vessels. Carotid artery calcifications. Vertebral artery calcification. Skull: No fracture Sinuses/Orbits: Mild mucosal thickening within the ethmoid sinuses. No acute orbital abnormality. Other: None CT CERVICAL SPINE FINDINGS Alignment: Trace retrolisthesis of C5 on C6, unchanged. Trace anterolisthesis C3 and C4 unchanged. Facet alignment within normal limits. Skull base and vertebrae: No acute fracture. No primary bone lesion or focal pathologic process. Soft tissues and spinal canal: No prevertebral fluid or swelling. No visible canal hematoma. Disc levels: Moderate severe degenerative changes at C4-C5, C5-C6 and C6-C7. Bilateral foraminal stenosis C4 through C7, most notable at C5 C6. Upper chest: Apical scarring.  Carotid artery calcification. Other: None IMPRESSION: 1. No CT evidence  for acute intracranial abnormality. Atrophy and small vessel ischemic changes of the white matter 2. Stable cervical spine alignment with degenerative changes. No acute osseous abnormality Electronically Signed   By: Donavan Foil M.D.   On: 10/27/2016 18:54   Ct Cervical Spine Wo Contrast  Result Date: 10/27/2016 CLINICAL DATA:  Fall from standing position with left hip fracture EXAM: CT HEAD WITHOUT CONTRAST CT CERVICAL SPINE WITHOUT CONTRAST TECHNIQUE: Multidetector CT imaging of the head and cervical spine was performed following the standard protocol without intravenous contrast. Multiplanar CT image reconstructions of the cervical spine were also generated. COMPARISON:  11/10/2013 FINDINGS: CT HEAD FINDINGS Brain: No acute territorial infarction, hemorrhage, or intracranial mass is seen. Marked atrophy. Mild small vessel ischemic changes of the white matter. Stable small left sub insula hypodensities. Stable enlarged ventricles, felt secondary to atrophy. Vascular: No hyperdense vessels. Carotid artery calcifications. Vertebral artery calcification. Skull: No fracture Sinuses/Orbits: Mild mucosal thickening within the ethmoid sinuses. No acute orbital abnormality. Other: None CT CERVICAL SPINE FINDINGS Alignment: Trace retrolisthesis of C5 on C6, unchanged. Trace anterolisthesis C3 and C4 unchanged. Facet alignment within normal limits. Skull base and vertebrae: No acute fracture. No primary bone lesion or focal pathologic process. Soft tissues and spinal canal: No prevertebral fluid or swelling. No visible canal hematoma. Disc levels: Moderate severe degenerative changes at C4-C5, C5-C6 and C6-C7. Bilateral foraminal stenosis C4 through C7, most notable at C5 C6. Upper chest: Apical scarring.  Carotid artery calcification. Other: None IMPRESSION: 1. No CT evidence for acute intracranial abnormality. Atrophy and small vessel ischemic changes of the white matter 2. Stable cervical spine alignment with  degenerative changes. No acute osseous abnormality Electronically Signed   By: Donavan Foil M.D.   On: 10/27/2016 18:54   Pelvis Portable  Result Date:  10/31/2016 CLINICAL DATA:  Arthroplasty left hip. EXAM: PORTABLE PELVIS 1-2 VIEWS COMPARISON:  Plain film of the pelvis dated 08/25/2009. FINDINGS: Status post left hip arthroplasty. Hardware appears intact and appropriately position. Right hip arthroplasty is stable in position. Overall osseous alignment appears anatomic. Expected postsurgical changes within the soft tissues about the left hip. IMPRESSION: Status post left hip arthroplasty. Hardware appears intact and appropriately positioned. No evidence of surgical complicating feature. Electronically Signed   By: Franki Cabot M.D.   On: 10/31/2016 12:08   Dg Chest Port 1 View  Result Date: 11/02/2016 CLINICAL DATA:  Hypoxia. EXAM: PORTABLE CHEST 1 VIEW COMPARISON:  Chest x-ray from yesterday. FINDINGS: Stable cardiomegaly. Mild pulmonary vascular congestion. Unchanged small to moderate bilateral pleural effusions. No pneumothorax. IMPRESSION: Stable cardiomegaly and small to moderate bilateral pleural effusions. Electronically Signed   By: Titus Dubin M.D.   On: 11/02/2016 09:26   Dg Chest Port 1 View  Result Date: 11/01/2016 CLINICAL DATA:  Hypoxia EXAM: PORTABLE CHEST 1 VIEW COMPARISON:  10/28/2014 FINDINGS: New small to moderate bilateral pleural effusions with central vascular congestion and pulmonary vascular redistribution consistent with CHF. Stable cardiomegaly with aortic atherosclerosis. Calcified nodule the right upper lobe. Old T8 and T11 compression fractures. IMPRESSION: 1. Mild CHF with small to moderate bilateral pleural effusions. 2. Chronic stable T8 and T11 compression fractures of the thoracic spine. 3. Granuloma in the right upper lobe, stable appearance Electronically Signed   By: Ashley Royalty M.D.   On: 11/01/2016 20:47   Dg Hip Unilat With Pelvis 2-3 Views Left  Result  Date: 10/27/2016 CLINICAL DATA:  Fall EXAM: DG HIP (WITH OR WITHOUT PELVIS) 2-3V LEFT COMPARISON:  None. FINDINGS: Fracture left femoral neck with mild displacement. Normal left hip joint Chronic fracture right pubic rami. Right hip hemiarthroplasty. Lumbar disc degeneration IMPRESSION: Displaced fracture left femoral neck Electronically Signed   By: Franchot Gallo M.D.   On: 10/27/2016 18:13   11/15/16 chest xray- Frontal and lateral views of chest Impression. There is pulmonary infiltrate versus edema in the left lung base. There are small bilateral pleural effusions. Findings are compatible with the presence of pneumonia. Mild congestive heart failure is the differential diagnosis to be considered. Clinical correlation and radiographic follow-up are recommended. There is a 5 mm granuloma in the right middle lobe.    Assessment/Plan  1. Pneumonia of left lower lobe due to infectious organism ([place]) moxifloxacin 400 mg daily x 1 week with florastor and mucinex dm. Aspiration precautions. Assist with feeding. Encourage using incentive spirometer as tolerated.   2. Dysphagia, oropharyngeal Aspiration precautions. SLP evaluated pt. Currently on puree food with soft solid and continue thickened liquid  3. Left hip pain Worsened. Currently on lidocaine patch and tylenol 650 mg q6h prn pain. Change her tylenol to 1000 mg tid for now x 2 weeks and reassess. Will schedule pain med as pt will not remember to ask for this. If no improvement, consider repeat xray to left hip to rule out acute abnormality. Fall precautions.   4. Closed fracture of neck of left femur, sequela Fall precaution and to work with PT/OT as tolerated. Change to tylenol as above. Has upcoming f/u with orthopedic. Not bearing weight at present and gets transferred via hoyer lift. If no improvement, will need to review goals of care with family.     Family/ staff Communication: reviewed care plan with patient and charge nurse.     Labs/tests ordered:  none  Blanchie Serve, MD Internal  [facility] [place], [place] [address] Cell Phone (Monday-Friday 8 am - 5 pm): [phone] On Call: [phone] and follow prompts after 5 pm and on weekends Office Phone: [phone] Office Fax: [phone]

## 2016-11-18 DIAGNOSIS — D500 Iron deficiency anemia secondary to blood loss (chronic): Secondary | ICD-10-CM | POA: Diagnosis not present

## 2016-11-18 DIAGNOSIS — R791 Abnormal coagulation profile: Secondary | ICD-10-CM | POA: Diagnosis not present

## 2016-11-22 DIAGNOSIS — R2681 Unsteadiness on feet: Secondary | ICD-10-CM | POA: Diagnosis not present

## 2016-11-22 DIAGNOSIS — K5900 Constipation, unspecified: Secondary | ICD-10-CM | POA: Diagnosis not present

## 2016-11-22 DIAGNOSIS — I10 Essential (primary) hypertension: Secondary | ICD-10-CM | POA: Diagnosis not present

## 2016-11-22 DIAGNOSIS — M899 Disorder of bone, unspecified: Secondary | ICD-10-CM | POA: Diagnosis not present

## 2016-11-22 DIAGNOSIS — R1312 Dysphagia, oropharyngeal phase: Secondary | ICD-10-CM | POA: Diagnosis not present

## 2016-11-22 DIAGNOSIS — M8580 Other specified disorders of bone density and structure, unspecified site: Secondary | ICD-10-CM | POA: Diagnosis not present

## 2016-11-22 DIAGNOSIS — R296 Repeated falls: Secondary | ICD-10-CM | POA: Diagnosis not present

## 2016-11-22 DIAGNOSIS — E039 Hypothyroidism, unspecified: Secondary | ICD-10-CM | POA: Diagnosis not present

## 2016-11-22 DIAGNOSIS — M6281 Muscle weakness (generalized): Secondary | ICD-10-CM | POA: Diagnosis not present

## 2016-11-22 DIAGNOSIS — Z9181 History of falling: Secondary | ICD-10-CM | POA: Diagnosis not present

## 2016-11-22 DIAGNOSIS — R41841 Cognitive communication deficit: Secondary | ICD-10-CM | POA: Diagnosis not present

## 2016-11-22 DIAGNOSIS — R413 Other amnesia: Secondary | ICD-10-CM | POA: Diagnosis not present

## 2016-11-22 DIAGNOSIS — L299 Pruritus, unspecified: Secondary | ICD-10-CM | POA: Diagnosis not present

## 2016-11-22 DIAGNOSIS — R351 Nocturia: Secondary | ICD-10-CM | POA: Diagnosis not present

## 2016-11-22 DIAGNOSIS — I679 Cerebrovascular disease, unspecified: Secondary | ICD-10-CM | POA: Diagnosis not present

## 2016-11-22 DIAGNOSIS — F418 Other specified anxiety disorders: Secondary | ICD-10-CM | POA: Diagnosis not present

## 2016-11-22 DIAGNOSIS — R262 Difficulty in walking, not elsewhere classified: Secondary | ICD-10-CM | POA: Diagnosis not present

## 2016-11-22 DIAGNOSIS — R488 Other symbolic dysfunctions: Secondary | ICD-10-CM | POA: Diagnosis not present

## 2016-11-23 DIAGNOSIS — I481 Persistent atrial fibrillation: Secondary | ICD-10-CM | POA: Diagnosis not present

## 2016-11-23 DIAGNOSIS — G8911 Acute pain due to trauma: Secondary | ICD-10-CM | POA: Diagnosis not present

## 2016-11-23 DIAGNOSIS — I5040 Unspecified combined systolic (congestive) and diastolic (congestive) heart failure: Secondary | ICD-10-CM | POA: Diagnosis not present

## 2016-11-23 DIAGNOSIS — I10 Essential (primary) hypertension: Secondary | ICD-10-CM | POA: Diagnosis not present

## 2016-11-23 LAB — POCT INR: INR: 2.2 — AB (ref <=1.1)

## 2016-11-23 LAB — PROTIME-INR: Protime: 23.4 — AB (ref 10.0–13.8)

## 2016-11-25 ENCOUNTER — Other Ambulatory Visit: Payer: Self-pay | Admitting: *Deleted

## 2016-11-30 DIAGNOSIS — G8911 Acute pain due to trauma: Secondary | ICD-10-CM | POA: Diagnosis not present

## 2016-11-30 DIAGNOSIS — R000 Tachycardia, unspecified: Secondary | ICD-10-CM | POA: Diagnosis not present

## 2016-11-30 DIAGNOSIS — I5040 Unspecified combined systolic (congestive) and diastolic (congestive) heart failure: Secondary | ICD-10-CM | POA: Diagnosis not present

## 2016-11-30 DIAGNOSIS — I481 Persistent atrial fibrillation: Secondary | ICD-10-CM | POA: Diagnosis not present

## 2016-12-06 ENCOUNTER — Non-Acute Institutional Stay (SKILLED_NURSING_FACILITY): Payer: PPO | Admitting: Nurse Practitioner

## 2016-12-06 ENCOUNTER — Encounter: Payer: Self-pay | Admitting: Nurse Practitioner

## 2016-12-06 DIAGNOSIS — I480 Paroxysmal atrial fibrillation: Secondary | ICD-10-CM

## 2016-12-06 DIAGNOSIS — F418 Other specified anxiety disorders: Secondary | ICD-10-CM | POA: Diagnosis not present

## 2016-12-06 DIAGNOSIS — I509 Heart failure, unspecified: Secondary | ICD-10-CM

## 2016-12-06 DIAGNOSIS — F0390 Unspecified dementia without behavioral disturbance: Secondary | ICD-10-CM | POA: Diagnosis not present

## 2016-12-06 NOTE — Progress Notes (Signed)
Location:  [facility] Room Number: 107 Place of Service:  SNF (31) Provider:  Connie Hilgert, [place]  NP  Blanchie Serve, MD  Patient Care Team: Blanchie Serve, MD as PCP - General (Internal Medicine) Zyren Sevigny X, NP as Nurse Practitioner (Nurse Practitioner) Wilford Corner, MD as Consulting Physician (Gastroenterology) Sanda Klein, MD as Consulting Physician (Cardiology)  Extended Emergency Contact Information Primary Emergency Contact: [name],Pat Address: [place] [address] [name] of [place] Phone: [phone] Mobile Phone: [phone] Relation: Daughter  Code Status:  DNR Goals of care: Advanced Directive information Advanced Directives 12/06/2016  Does Patient Have a Medical Advance Directive? Yes  Type of Paramedic of [place];Out of facility DNR (pink MOST or yellow form)  Does patient want to make changes to medical advance directive? No - Patient declined  Copy of [place] in Chart? Yes  Pre-existing out of facility DNR order (yellow form or pink MOST form) Yellow form placed in chart (order not valid for inpatient use)     Chief Complaint  Patient presents with  . Medical Management of Chronic Issues    HPI:  Pt is a 83 y.o. female seen today for medical management of chronic diseases.     The patient has history of dementia, takes Memantine,  Afib, on Coumadin for PE/DVT risk reduction,  heart rate is in control, takes Diltiazem 30mg  qid, Bystolic 10mg  qd. CHF, compensated clinically, taking Furosemide 20mg  as needed, recent hip fx repair, no longer ambulatory independently, but she denies pain or discomfort. Her mood is table while on Escitalopram 5mg  qod.    Past Medical History:  Diagnosis Date  . Anemia, unspecified   . Anorexia   . Anxiety   . Arthritis   . Atherosclerotic cerebrovascular disease   . Backache, unspecified   . Cataract   .  Cholelithiasis   . Chronic kidney disease, unspecified   . Closed fracture of right inferior pubic ramus ([place]) 11/12/2013   11/10/13 s/p fall, ED eval  X-ray R hip  1. Possible nondisplaced right inferior pubic ramus fracture.  2. No femur fracture or dislocation   02/06/14 dc prn Motrin and Norco-not used    . Depression   . Diaphragmatic hernia without mention of obstruction or gangrene   . Diseases of lips 03/26/2013  . Diverticulosis of colon (without mention of hemorrhage)   . Hemorrhage of rectum and anus 03/26/2013  . Hyperlipidemia   . Hypertension   . Insomnia, unspecified   . Laceration of occipital scalp 11/12/2013  . Macular degeneration 06/28/2013  . Macular degeneration (senile) of retina, unspecified   . Osteoporosis   . Other malaise and fatigue   . Other sleep disturbances   . Other specified cardiac dysrhythmias(427.89)   . Other specified personal history presenting hazards to health(V15.89)   . Other symptoms involving cardiovascular system   . Pancreatitis   . Personal history of other diseases of circulatory system   . Personal history of other diseases of digestive system    pancreatitis from gallstones  . Personal history of other diseases of respiratory system   . Personal history of traumatic fracture   . Rosacea   . Senile osteoporosis    with old T11 fracture  . Thyroid disease   . Unspecified disorders of arteries and arterioles   . Unspecified hearing loss   . Unspecified hemorrhoids without mention of complication  Past Surgical History:  Procedure Laterality Date  . ABDOMINAL HYSTERECTOMY     and BSO fibroids  . COLONOSCOPY  05/23/2007  . EYE LID LIFT Right 09/2016   OD  . HIP ARTHROPLASTY Left 10/31/2016   Procedure: ARTHROPLASTY  HIP (HEMIARTHROPLASTY);  Surgeon: Paralee Cancel, MD;  Location: [facility];  Service: Orthopedics;  Laterality: Left;  . JOINT REPLACEMENT  1996  . TOTAL HIP ARTHROPLASTY Right     Allergies  Allergen Reactions  . Ace  Inhibitors Cough  . Mirtazapine Other (See Comments)    Nightmares   . Penicillins Rash    Has patient had a PCN reaction causing immediate rash, facial/tongue/throat swelling, SOB or lightheadedness with hypotension: Yes Has patient had a PCN reaction causing severe rash involving mucus membranes or skin necrosis: Unknown Has patient had a PCN reaction that required hospitalization: Unknown Has patient had a PCN reaction occurring within the last 10 years: Unknown If all of the above answers are "NO", then may proceed with Cephalosporin use.     Outpatient Encounter Prescriptions as of 12/06/2016  Medication Sig  . acetaminophen (TYLENOL) 325 MG tablet Take 2 tablets (650 mg total) by mouth every 6 (six) hours as needed for mild pain (or Fever >/= 101).  Marland Kitchen alum & mag hydroxide-simeth (MAALOX/MYLANTA) 200-200-20 MG/5ML suspension Take 30 mLs by mouth every 4 (four) hours as needed for indigestion.  Marland Kitchen BYSTOLIC 10 MG tablet Take 10 mg by mouth daily.   . cholecalciferol 2000 units TABS Take 1 tablet (2,000 Units total) by mouth daily.  . clindamycin (CLEOCIN) 150 MG capsule Take 600 mg by mouth See admin instructions. ONE HOUR PRIOR TO DENTAL APPOINTMENTS  . diltiazem (CARDIZEM) 30 MG tablet Take 30 mg by mouth 4 (four) times daily.  Marland Kitchen escitalopram (LEXAPRO) 5 MG tablet Take 5 mg by mouth every other day.   . furosemide (LASIX) 20 MG tablet Take 20 mg by mouth as needed for edema (to be given with the potassium 10 meq).   . hydrocortisone cream 1 % Apply 1 application topically 2 (two) times daily. TO HEMORRHOIDS  . levothyroxine (SYNTHROID, LEVOTHROID) 25 MCG tablet Take 25 mcg by mouth daily before breakfast.   . Lidocaine (ASPERCREME LIDOCAINE) 4 % PTCH Apply 1 patch topically at bedtime. APPLY TO LOWER BACK AND REMOVE IN THE MORNING  . memantine (NAMENDA XR) 28 MG CP24 24 hr capsule Take 28 mg by mouth.  . Nutritional Supplements (BENECALORIE PO) Take 1.5 oz by mouth 2 (two) times daily.    . ondansetron (ZOFRAN) 4 MG tablet Take 1 tablet (4 mg total) by mouth every 6 (six) hours as needed for nausea.  . polyethylene glycol (MIRALAX / GLYCOLAX) packet Take 17 g by mouth every other day. For constipation and hold for loose stools  . polyvinyl alcohol (LIQUIFILM TEARS) 1.4 % ophthalmic solution Place 1 drop into both eyes 4 (four) times daily.   . potassium chloride (K-DUR,KLOR-CON) 10 MEQ tablet Take 10 mEq by mouth as needed (for edema/if taking edema).   . warfarin (COUMADIN) 1 MG tablet Take 1.5 mg by mouth daily. Give 1 tablet and 1/2 = 1.5 mg  . [DISCONTINUED] dextromethorphan-guaiFENesin (MUCINEX DM) 30-600 MG 12hr tablet Take 1 tablet by mouth 2 (two) times daily. Stop date 11/22/16  . [DISCONTINUED] moxifloxacin (AVELOX) 400 MG tablet Take 400 mg by mouth daily at 8 pm. Stop date 11/21/16  . [DISCONTINUED] saccharomyces boulardii (FLORASTOR) 250 MG capsule Take 250 mg by mouth 2 (two)  times daily. Stop date 11/24/16   No facility-administered encounter medications on file as of 12/06/2016.    ROS was provided with assistance of staff Review of Systems  Constitutional: Negative for activity change, appetite change, chills, diaphoresis, fatigue and fever.  HENT: Positive for hearing loss. Negative for congestion and trouble swallowing.   Eyes: Negative for discharge, redness and visual disturbance.  Respiratory: Negative for cough, choking, chest tightness, shortness of breath and wheezing.   Cardiovascular: Negative for chest pain, palpitations and leg swelling.  Gastrointestinal: Negative for abdominal distention, abdominal pain, constipation, diarrhea, nausea and vomiting.  Genitourinary: Negative for dysuria.  Musculoskeletal: Positive for arthralgias and gait problem. Negative for back pain.       Transfer with assistance x1, no longer ambulatory independent.   Skin: Negative for rash and wound.  Neurological: Negative for speech difficulty.  Psychiatric/Behavioral:  Positive for confusion and sleep disturbance. Negative for behavioral problems.    Immunization History  Administered Date(s) Administered  . DTaP 10/12/2012  . Influenza-Unspecified 11/22/2012, 12/26/2013, 11/12/2014, 12/09/2015  . PPD Test 12/06/2013  . Pneumococcal Polysaccharide-23 09/22/2000  . Td 07/08/2005  . Tdap 11/10/2013   Pertinent  Health Maintenance Due  Topic Date Due  . INFLUENZA VACCINE  09/22/2016  . PNA vac Low Risk Adult (2 of 2 - PCV13) 02/22/2017 (Originally 09/22/2001)  . DEXA SCAN  Completed   Fall Risk  10/22/2016 01/13/2015 06/28/2013  Falls in the past year? No No No  Risk for fall due to : - Impaired balance/gait -   Functional Status Survey:    Vitals:   12/06/16 1303  BP: 106/68  Pulse: 82  Resp: 18  Temp: (!) 97.2 F (36.2 C)  SpO2: 98%  Weight: 113 lb 9.6 oz (51.5 kg)  Height: 5' (1.524 m)   Body mass index is 22.19 kg/m. Physical Exam  Constitutional: She appears well-developed and well-nourished.  HENT:  Head: Normocephalic and atraumatic.  Eyes: Pupils are equal, round, and reactive to light. Conjunctivae are normal.  Neck: Normal range of motion. Neck supple. No JVD present.  Cardiovascular: Normal rate and regular rhythm.   No murmur heard. Pulmonary/Chest: Effort normal and breath sounds normal. She has no wheezes. She has no rales.  Abdominal: Soft. Bowel sounds are normal. She exhibits no distension. There is no tenderness.  Musculoskeletal: Normal range of motion. She exhibits no edema or tenderness.  No longer ambulating independently, assist x1 for transfer   Neurological: She is alert.  Oriented to self.   Skin: Skin is warm and dry.  Psychiatric: She has a normal mood and affect. Her behavior is normal.    Labs reviewed:  Recent Labs  10/31/16 0236 11/01/16 0353 11/02/16 0445 11/11/16  NA 140 140 139 142  K 3.7 3.8 3.9 3.6  CL 112* 113* 112*  --   CO2 21* 21* 20*  --   GLUCOSE 108* 149* 110*  --   BUN 27* 32*  24* 19  CREATININE 0.90 0.90 0.80 0.8  CALCIUM 8.2* 8.3* 8.3*  --     Recent Labs  06/10/16 06/29/16 08/31/16 10/28/16 0329  AST 24 29 19   --   ALT 11 14 12   --   ALKPHOS 67 50 70  --   ALBUMIN  --   --   --  3.0*    Recent Labs  10/27/16 1715  11/01/16 0353 11/02/16 0445 11/03/16 0318 11/11/16  WBC 9.6  < > 6.0 7.4 8.4 6.4  NEUTROABS 8.4*  --   --   --   --   --  HGB 12.4  < > 10.0* 9.5* 10.3* 9.8*  HCT 39.1  < > 31.2* 29.8* 32.6* 29*  MCV 94.7  < > 94.8 96.1 95.0  --   PLT 174  < > 179 163 197 419*  < > = values in this interval not displayed. Lab Results  Component Value Date   TSH 2.048 10/27/2016   No results found for: HGBA1C No results found for: CHOL, HDL, LDLCALC, LDLDIRECT, TRIG, CHOLHDL  Significant Diagnostic Results in last 30 days:  No results found.  Assessment/Plan A-fib (HCC) Heart rate is in control, continue Diltiazem 30mg  po qid, Bystolic 10mg  po daily.    Congestive heart failure (CHF) ([place]) compensated clinically, taking Furosemide 20mg  as needed  Senile dementia Resides in memory care unit, takes Memantine  Depression with anxiety Her mood is table while on Escitalopram 5mg  qod.        Family/ staff Communication: plan of care reviewed with the patient and charge nurse.   Labs/tests ordered:   none  Time spend 25 minutes.

## 2016-12-06 NOTE — Assessment & Plan Note (Signed)
Heart rate is in control, continue Diltiazem 30mg  po qid, Bystolic 10mg  po daily.

## 2016-12-06 NOTE — Assessment & Plan Note (Signed)
compensated clinically, taking Furosemide 20mg  as needed

## 2016-12-06 NOTE — Assessment & Plan Note (Signed)
Resides in memory care unit, takes Memantine

## 2016-12-06 NOTE — Assessment & Plan Note (Signed)
Her mood is table while on Escitalopram 5mg  qod.

## 2016-12-14 DIAGNOSIS — I481 Persistent atrial fibrillation: Secondary | ICD-10-CM | POA: Diagnosis not present

## 2016-12-14 DIAGNOSIS — Z7901 Long term (current) use of anticoagulants: Secondary | ICD-10-CM | POA: Diagnosis not present

## 2016-12-14 LAB — POCT INR: INR: 1.6 — AB (ref <=1.1)

## 2016-12-14 LAB — PROTIME-INR: PROTIME: 16.7 — AB (ref 10.0–13.8)

## 2016-12-15 ENCOUNTER — Other Ambulatory Visit: Payer: Self-pay | Admitting: *Deleted

## 2016-12-17 ENCOUNTER — Encounter: Payer: Self-pay | Admitting: Nurse Practitioner

## 2016-12-17 ENCOUNTER — Non-Acute Institutional Stay (SKILLED_NURSING_FACILITY): Payer: PPO | Admitting: Nurse Practitioner

## 2016-12-17 DIAGNOSIS — I480 Paroxysmal atrial fibrillation: Secondary | ICD-10-CM

## 2016-12-17 DIAGNOSIS — Z5181 Encounter for therapeutic drug level monitoring: Secondary | ICD-10-CM

## 2016-12-17 DIAGNOSIS — Z471 Aftercare following joint replacement surgery: Secondary | ICD-10-CM | POA: Diagnosis not present

## 2016-12-17 DIAGNOSIS — Z7901 Long term (current) use of anticoagulants: Secondary | ICD-10-CM

## 2016-12-17 DIAGNOSIS — Z96642 Presence of left artificial hip joint: Secondary | ICD-10-CM | POA: Diagnosis not present

## 2016-12-17 NOTE — Assessment & Plan Note (Signed)
Hx of Afib, heart rate is in control, she takes Diltiazem 30mg  qid and Bystolic 10mg  qd.

## 2016-12-17 NOTE — Progress Notes (Signed)
Location:  [facility] Room Number: 107 Place of Service:  SNF (31) Provider:  Benedetto Ryder, Manxie  NP  Blanchie Serve, MD  Patient Care Team: Blanchie Serve, MD as PCP - General (Internal Medicine) Irini Leet X, NP as Nurse Practitioner (Nurse Practitioner) Wilford Corner, MD as Consulting Physician (Gastroenterology) Sanda Klein, MD as Consulting Physician (Cardiology)  Extended Emergency Contact Information Primary Emergency Contact: [name],Pat Address: [place] [address] [name] of [place] Phone: [phone] Mobile Phone: [phone] Relation: Daughter  Code Status:  DNR Goals of care: Advanced Directive information Advanced Directives 12/17/2016  Does Patient Have a Medical Advance Directive? Yes  Type of Paramedic of [place];Out of facility DNR (pink MOST or yellow form)  Does patient want to make changes to medical advance directive? No - Patient declined  Copy of [place] in Chart? Yes  Pre-existing out of facility DNR order (yellow form or pink MOST form) Yellow form placed in chart (order not valid for inpatient use)     Chief Complaint  Patient presents with  . Acute Visit    PT/INR    HPI:  Pt is a 83 y.o. female seen today for an acute visit for sub therapeutic anticoagulation therapy for Afib, INR 1.6 1023/18, coumadin was increased to 3mg  12/14/16, then 2mg  qd/1.5mg  daily, pending PT/INR in one week. The patient has no s/s of bleeding or PT/DVT. Hx of Afib, heart rate is in control, she takes Diltiazem 30mg  qid and Bystolic 10mg  qd.    Past Medical History:  Diagnosis Date  . Anemia, unspecified   . Anorexia   . Anxiety   . Arthritis   . Atherosclerotic cerebrovascular disease   . Backache, unspecified   . Cataract   . Cholelithiasis   . Chronic kidney disease, unspecified   . Closed fracture of right inferior pubic ramus ([place]) 11/12/2013   11/10/13 s/p fall, ED eval  X-ray R hip  1. Possible nondisplaced right inferior pubic ramus fracture.  2. No femur fracture or dislocation   02/06/14 dc prn Motrin and Norco-not used    . Depression   . Diaphragmatic hernia without mention of obstruction or gangrene   . Diseases of lips 03/26/2013  . Diverticulosis of colon (without mention of hemorrhage)   . Hemorrhage of rectum and anus 03/26/2013  . Hyperlipidemia   . Hypertension   . Insomnia, unspecified   . Laceration of occipital scalp 11/12/2013  . Macular degeneration 06/28/2013  . Macular degeneration (senile) of retina, unspecified   . Osteoporosis   . Other malaise and fatigue   . Other sleep disturbances   . Other specified cardiac dysrhythmias(427.89)   . Other specified personal history presenting hazards to health(V15.89)   . Other symptoms involving cardiovascular system   . Pancreatitis   . Personal history of other diseases of circulatory system   . Personal history of other diseases of digestive system    pancreatitis from gallstones  . Personal history of other diseases of respiratory system   . Personal history of traumatic fracture   . Rosacea   . Senile osteoporosis    with old T11 fracture  . Thyroid disease   . Unspecified disorders of arteries and arterioles   . Unspecified hearing loss   . Unspecified hemorrhoids without mention of complication    Past Surgical History:  Procedure Laterality Date  . ABDOMINAL HYSTERECTOMY  and BSO fibroids  . COLONOSCOPY  05/23/2007  . EYE LID LIFT Right 09/2016   OD  . HIP ARTHROPLASTY Left 10/31/2016   Procedure: ARTHROPLASTY  HIP (HEMIARTHROPLASTY);  Surgeon: Paralee Cancel, MD;  Location: [facility];  Service: Orthopedics;  Laterality: Left;  . JOINT REPLACEMENT  1996  . TOTAL HIP ARTHROPLASTY Right     Allergies  Allergen Reactions  . Ace Inhibitors Cough  . Mirtazapine Other (See Comments)    Nightmares   . Penicillins Rash    Has patient had a PCN reaction  causing immediate rash, facial/tongue/throat swelling, SOB or lightheadedness with hypotension: Yes Has patient had a PCN reaction causing severe rash involving mucus membranes or skin necrosis: Unknown Has patient had a PCN reaction that required hospitalization: Unknown Has patient had a PCN reaction occurring within the last 10 years: Unknown If all of the above answers are "NO", then may proceed with Cephalosporin use.     Outpatient Encounter Prescriptions as of 12/17/2016  Medication Sig  . acetaminophen (TYLENOL) 325 MG tablet Take 2 tablets (650 mg total) by mouth every 6 (six) hours as needed for mild pain (or Fever >/= 101).  Marland Kitchen alum & mag hydroxide-simeth (MAALOX/MYLANTA) 200-200-20 MG/5ML suspension Take 30 mLs by mouth every 4 (four) hours as needed for indigestion.  Marland Kitchen BYSTOLIC 10 MG tablet Take 10 mg by mouth daily.   . cholecalciferol 2000 units TABS Take 1 tablet (2,000 Units total) by mouth daily.  . clindamycin (CLEOCIN) 150 MG capsule Take 600 mg by mouth See admin instructions. ONE HOUR PRIOR TO DENTAL APPOINTMENTS  . diltiazem (CARDIZEM) 30 MG tablet Take 30 mg by mouth 4 (four) times daily.  Marland Kitchen escitalopram (LEXAPRO) 5 MG tablet Take 5 mg by mouth every other day.   . furosemide (LASIX) 20 MG tablet Take 20 mg by mouth as needed for edema (to be given with the potassium 10 meq).   . hydrocortisone cream 1 % Apply 1 application topically 2 (two) times daily. TO HEMORRHOIDS  . levothyroxine (SYNTHROID, LEVOTHROID) 25 MCG tablet Take 25 mcg by mouth daily before breakfast.   . Lidocaine (ASPERCREME LIDOCAINE) 4 % PTCH Apply 1 patch topically at bedtime. APPLY TO LOWER BACK AND REMOVE IN THE MORNING  . memantine (NAMENDA XR) 28 MG CP24 24 hr capsule Take 28 mg by mouth.  . Nutritional Supplements (BENECALORIE PO) Take 1.5 oz by mouth 2 (two) times daily.   . ondansetron (ZOFRAN) 4 MG tablet Take 1 tablet (4 mg total) by mouth every 6 (six) hours as needed for nausea.  .  polyethylene glycol (MIRALAX / GLYCOLAX) packet Take 17 g by mouth every other day. For constipation and hold for loose stools  . polyvinyl alcohol (LIQUIFILM TEARS) 1.4 % ophthalmic solution Place 1 drop into both eyes 4 (four) times daily.   . potassium chloride (K-DUR,KLOR-CON) 10 MEQ tablet Take 10 mEq by mouth as needed (for edema/if taking edema).   . warfarin (COUMADIN) 2 MG tablet Take 2 mg by mouth daily.  . [DISCONTINUED] warfarin (COUMADIN) 1 MG tablet Take 2 mg by mouth daily. Give 1 tablet and 1/2 = 1.5 mg   . [DISCONTINUED] warfarin (COUMADIN) 3 MG tablet Take 3 mg by mouth daily.   No facility-administered encounter medications on file as of 12/17/2016.    ROS was provided with assistance of staff Review of Systems  Constitutional: Negative for activity change, appetite change, chills, diaphoresis and fatigue.  Respiratory: Negative for cough, choking, chest tightness,  shortness of breath and wheezing.   Cardiovascular: Negative for chest pain, palpitations and leg swelling.  Gastrointestinal: Negative for abdominal pain, anal bleeding and blood in stool.  Genitourinary: Negative for hematuria.  Musculoskeletal: Positive for gait problem.       Sit to stand for transfer, w/c dependent.   Skin: Negative for color change and pallor.  Neurological:       Dementia.     Immunization History  Administered Date(s) Administered  . DTaP 10/12/2012  . Influenza-Unspecified 11/22/2012, 12/26/2013, 11/12/2014, 12/09/2015  . PPD Test 12/06/2013  . Pneumococcal Polysaccharide-23 09/22/2000  . Td 07/08/2005  . Tdap 11/10/2013   Pertinent  Health Maintenance Due  Topic Date Due  . INFLUENZA VACCINE  09/22/2016  . PNA vac Low Risk Adult (2 of 2 - PCV13) 02/22/2017 (Originally 09/22/2001)  . DEXA SCAN  Completed   Fall Risk  10/22/2016 01/13/2015 06/28/2013  Falls in the past year? No No No  Risk for fall due to : - Impaired balance/gait -   Functional Status Survey:    Vitals:     12/17/16 1218  BP: (!) 120/58  Pulse: 86  Resp: 18  Temp: 98.4 F (36.9 C)  Weight: 114 lb 12.8 oz (52.1 kg)  Height: 5' (1.524 m)   Body mass index is 22.42 kg/m. Physical Exam  Constitutional: She appears well-developed and well-nourished. No distress.  HENT:  Head: Normocephalic and atraumatic.  Eyes: Pupils are equal, round, and reactive to light. Conjunctivae and EOM are normal.  Neck: Neck supple.  Cardiovascular: Normal rate, regular rhythm and normal heart sounds.   No murmur heard. Pulmonary/Chest: Effort normal and breath sounds normal. She has no wheezes. She has no rales.  Abdominal: Soft. There is no tenderness.  Neurological: She is alert.  Oriented to self.   Skin: Skin is warm and dry. She is not diaphoretic. No pallor.  Psychiatric: She has a normal mood and affect.    Labs reviewed:  Recent Labs  10/31/16 0236 11/01/16 0353 11/02/16 0445 11/11/16  NA 140 140 139 142  K 3.7 3.8 3.9 3.6  CL 112* 113* 112*  --   CO2 21* 21* 20*  --   GLUCOSE 108* 149* 110*  --   BUN 27* 32* 24* 19  CREATININE 0.90 0.90 0.80 0.8  CALCIUM 8.2* 8.3* 8.3*  --     Recent Labs  06/10/16 06/29/16 08/31/16 10/28/16 0329  AST 24 29 19   --   ALT 11 14 12   --   ALKPHOS 67 50 70  --   ALBUMIN  --   --   --  3.0*    Recent Labs  10/27/16 1715  11/01/16 0353 11/02/16 0445 11/03/16 0318 11/11/16  WBC 9.6  < > 6.0 7.4 8.4 6.4  NEUTROABS 8.4*  --   --   --   --   --   HGB 12.4  < > 10.0* 9.5* 10.3* 9.8*  HCT 39.1  < > 31.2* 29.8* 32.6* 29*  MCV 94.7  < > 94.8 96.1 95.0  --   PLT 174  < > 179 163 197 419*  < > = values in this interval not displayed. Lab Results  Component Value Date   TSH 2.048 10/27/2016   No results found for: HGBA1C No results found for: CHOL, HDL, LDLCALC, LDLDIRECT, TRIG, CHOLHDL  Significant Diagnostic Results in last 30 days:  No results found.  Assessment/Plan Anticoagulation goal of INR 2 to 3 sub therapeutic anticoagulation  therapy for Afib, INR 1.6 1023/18, coumadin was increased to 3mg  12/14/16, then 2mg  qd/1.5mg  daily, pending PT/INR in one week.   A-fib (HCC) Hx of Afib, heart rate is in control, she takes Diltiazem 30mg  qid and Bystolic 10mg  qd.       Family/ staff Communication: plan of care reviewed with the patient and charge nurse.   Labs/tests ordered:  PT/INE pending  Time spend 25 minutes.

## 2016-12-17 NOTE — Assessment & Plan Note (Signed)
sub therapeutic anticoagulation therapy for Afib, INR 1.6 1023/18, coumadin was increased to 3mg  12/14/16, then 2mg  qd/1.5mg  daily, pending PT/INR in one week.

## 2016-12-21 DIAGNOSIS — I5040 Unspecified combined systolic (congestive) and diastolic (congestive) heart failure: Secondary | ICD-10-CM | POA: Diagnosis not present

## 2016-12-21 DIAGNOSIS — I679 Cerebrovascular disease, unspecified: Secondary | ICD-10-CM | POA: Diagnosis not present

## 2016-12-23 ENCOUNTER — Encounter: Payer: Self-pay | Admitting: Internal Medicine

## 2016-12-23 ENCOUNTER — Non-Acute Institutional Stay (SKILLED_NURSING_FACILITY): Payer: PPO | Admitting: Internal Medicine

## 2016-12-23 DIAGNOSIS — J309 Allergic rhinitis, unspecified: Secondary | ICD-10-CM | POA: Diagnosis not present

## 2016-12-23 DIAGNOSIS — H1011 Acute atopic conjunctivitis, right eye: Secondary | ICD-10-CM

## 2016-12-23 NOTE — Progress Notes (Signed)
Location:  [facility] Room Number: [place]:  SNF [phone] Provider:  Blanchie Serve, MD  Blanchie Serve, MD  Patient Care Team: Blanchie Serve, MD as PCP - General (Internal Medicine) Mast, Man X, NP as Nurse Practitioner (Nurse Practitioner) Wilford Corner, MD as Consulting Physician (Gastroenterology) Sanda Klein, MD as Consulting Physician (Cardiology)  Extended Emergency Contact Information Primary Emergency Contact: [name],Pat Address: [place] [address] [name] of [place] Phone: [phone] Mobile Phone: [phone] Relation: Daughter  Code Status:  DNR  Goals of care: Advanced Directive information Advanced Directives 12/23/2016  Does Patient Have a Medical Advance Directive? Yes  Type of Advance Directive Out of facility DNR (pink MOST or yellow form)  Does patient want to make changes to medical advance directive? No - Patient declined  Copy of [place] in Chart? -  Pre-existing out of facility DNR order (yellow form or pink MOST form) Yellow form placed in chart (order not valid for inpatient use)     Chief Complaint  Patient presents with  . Acute Visit    Eye discharge    HPI:  Pt is a 83 y.o. female seen today for an acute visit for discharge to her eyes. Nursing staff noted discharge to both eyes yesterday. Pt unable to participate in HPI and ROS with advanced dementia. Per nursing no fever reported. Appetite ok.    Past Medical History:  Diagnosis Date  . Anemia, unspecified   . Anorexia   . Anxiety   . Arthritis   . Atherosclerotic cerebrovascular disease   . Backache, unspecified   . Cataract   . Cholelithiasis   . Chronic kidney disease, unspecified   . Closed fracture of right inferior pubic ramus ([place]) 11/12/2013   11/10/13 s/p fall, ED eval  X-ray R hip  1. Possible nondisplaced right inferior pubic ramus fracture.  2. No femur fracture or dislocation    02/06/14 dc prn Motrin and Norco-not used    . Depression   . Diaphragmatic hernia without mention of obstruction or gangrene   . Diseases of lips 03/26/2013  . Diverticulosis of colon (without mention of hemorrhage)   . Hemorrhage of rectum and anus 03/26/2013  . Hyperlipidemia   . Hypertension   . Insomnia, unspecified   . Laceration of occipital scalp 11/12/2013  . Macular degeneration 06/28/2013  . Macular degeneration (senile) of retina, unspecified   . Osteoporosis   . Other malaise and fatigue   . Other sleep disturbances   . Other specified cardiac dysrhythmias(427.89)   . Other specified personal history presenting hazards to health(V15.89)   . Other symptoms involving cardiovascular system   . Pancreatitis   . Personal history of other diseases of circulatory system   . Personal history of other diseases of digestive system    pancreatitis from gallstones  . Personal history of other diseases of respiratory system   . Personal history of traumatic fracture   . Rosacea   . Senile osteoporosis    with old T11 fracture  . Thyroid disease   . Unspecified disorders of arteries and arterioles   . Unspecified hearing loss   . Unspecified hemorrhoids without mention of complication    Past Surgical History:  Procedure Laterality Date  . ABDOMINAL HYSTERECTOMY     and BSO fibroids  . COLONOSCOPY  05/23/2007  . EYE LID LIFT Right 09/2016   OD  .  HIP ARTHROPLASTY Left 10/31/2016   Procedure: ARTHROPLASTY  HIP (HEMIARTHROPLASTY);  Surgeon: Paralee Cancel, MD;  Location: [facility];  Service: Orthopedics;  Laterality: Left;  . JOINT REPLACEMENT  1996  . TOTAL HIP ARTHROPLASTY Right     Allergies  Allergen Reactions  . Ace Inhibitors Cough  . Mirtazapine Other (See Comments)    Nightmares   . Penicillins Rash    Has patient had a PCN reaction causing immediate rash, facial/tongue/throat swelling, SOB or lightheadedness with hypotension: Yes Has patient had a PCN reaction causing  severe rash involving mucus membranes or skin necrosis: Unknown Has patient had a PCN reaction that required hospitalization: Unknown Has patient had a PCN reaction occurring within the last 10 years: Unknown If all of the above answers are "NO", then may proceed with Cephalosporin use.     Outpatient Encounter Prescriptions as of 12/23/2016  Medication Sig  . acetaminophen (TYLENOL) 500 MG tablet Take 1,000 mg by mouth every 8 (eight) hours as needed for mild pain.  Marland Kitchen alum & mag hydroxide-simeth (MAALOX/MYLANTA) 200-200-20 MG/5ML suspension Take 30 mLs by mouth every 4 (four) hours as needed for indigestion.  Marland Kitchen BYSTOLIC 10 MG tablet Take 10 mg by mouth daily.   . cholecalciferol 2000 units TABS Take 1 tablet (2,000 Units total) by mouth daily.  . clindamycin (CLEOCIN) 150 MG capsule Take 600 mg by mouth See admin instructions. ONE HOUR PRIOR TO DENTAL APPOINTMENTS  . diltiazem (CARDIZEM) 30 MG tablet Take 30 mg by mouth 4 (four) times daily.  Marland Kitchen escitalopram (LEXAPRO) 5 MG tablet Take 5 mg by mouth every other day.   . furosemide (LASIX) 20 MG tablet Take 20 mg by mouth as needed for edema (to be given with the potassium 10 meq).   . hydrocortisone cream 1 % Apply 1 application topically 2 (two) times daily. TO HEMORRHOIDS  . levothyroxine (SYNTHROID, LEVOTHROID) 25 MCG tablet Take 25 mcg by mouth daily before breakfast.   . Lidocaine (ASPERCREME LIDOCAINE) 4 % PTCH Apply 1 patch topically at bedtime. APPLY TO LOWER BACK AND REMOVE IN THE MORNING  . memantine (NAMENDA XR) 28 MG CP24 24 hr capsule Take 28 mg by mouth.  . Nutritional Supplements (BENECALORIE PO) Take 1.5 oz by mouth 2 (two) times daily.   . ondansetron (ZOFRAN) 4 MG tablet Take 1 tablet (4 mg total) by mouth every 6 (six) hours as needed for nausea.  . polyethylene glycol (MIRALAX / GLYCOLAX) packet Take 17 g by mouth every other day. For constipation and hold for loose stools  . polyvinyl alcohol (LIQUIFILM TEARS) 1.4 %  ophthalmic solution Place 1 drop into both eyes 4 (four) times daily.   . potassium chloride (K-DUR,KLOR-CON) 10 MEQ tablet Take 10 mEq by mouth as needed (for edema/if taking edema).   . warfarin (COUMADIN) 2 MG tablet Take 2 mg by mouth daily.  . [DISCONTINUED] acetaminophen (TYLENOL) 325 MG tablet Take 2 tablets (650 mg total) by mouth every 6 (six) hours as needed for mild pain (or Fever >/= 101).   No facility-administered encounter medications on file as of 12/23/2016.     Review of Systems  Unable to perform ROS: Dementia    Immunization History  Administered Date(s) Administered  . DTaP 10/12/2012  . Influenza-Unspecified 11/22/2012, 12/26/2013, 11/12/2014, 12/09/2015  . PPD Test 12/06/2013  . Pneumococcal Polysaccharide-23 09/22/2000  . Td 07/08/2005  . Tdap 11/10/2013   Pertinent  Health Maintenance Due  Topic Date Due  . INFLUENZA VACCINE  09/22/2016  .  PNA vac Low Risk Adult (2 of 2 - PCV13) 02/22/2017 (Originally 09/22/2001)  . DEXA SCAN  Completed   Fall Risk  10/22/2016 01/13/2015 06/28/2013  Falls in the past year? No No No  Risk for fall due to : - Impaired balance/gait -   Functional Status Survey:    Vitals:   12/23/16 1018  BP: 122/60  Pulse: 74  Resp: 18  Temp: 98.1 F (36.7 C)  TempSrc: Oral  SpO2: 96%  Weight: 114 lb 12.8 oz (52.1 kg)  Height: 5' (1.524 m)   Body mass index is 22.42 kg/m. Physical Exam  Constitutional: She appears well-developed and well-nourished. No distress.  HENT:  Head: Normocephalic and atraumatic.  Mouth/Throat: Oropharynx is clear and moist. No oropharyngeal exudate.  Clear nasal discharge from right nostril  Eyes: Pupils are equal, round, and reactive to light. Right eye exhibits discharge. No scleral icterus.  Mild erythema to conjunctiva of both eyes. Has light yellow colored discharge to right lateral eye. No tenderness  Neck: Neck supple.  Cardiovascular: Normal rate and regular rhythm.   Murmur  heard. Pulmonary/Chest: Effort normal and breath sounds normal.  Lymphadenopathy:    She has no cervical adenopathy.  Skin: She is not diaphoretic.    Labs reviewed:  Recent Labs  10/31/16 0236 11/01/16 0353 11/02/16 0445 11/11/16  NA 140 140 139 142  K 3.7 3.8 3.9 3.6  CL 112* 113* 112*  --   CO2 21* 21* 20*  --   GLUCOSE 108* 149* 110*  --   BUN 27* 32* 24* 19  CREATININE 0.90 0.90 0.80 0.8  CALCIUM 8.2* 8.3* 8.3*  --     Recent Labs  06/10/16 06/29/16 08/31/16 10/28/16 0329  AST 24 29 19   --   ALT 11 14 12   --   ALKPHOS 67 50 70  --   ALBUMIN  --   --   --  3.0*    Recent Labs  10/27/16 1715  11/01/16 0353 11/02/16 0445 11/03/16 0318 11/11/16  WBC 9.6  < > 6.0 7.4 8.4 6.4  NEUTROABS 8.4*  --   --   --   --   --   HGB 12.4  < > 10.0* 9.5* 10.3* 9.8*  HCT 39.1  < > 31.2* 29.8* 32.6* 29*  MCV 94.7  < > 94.8 96.1 95.0  --   PLT 174  < > 179 163 197 419*  < > = values in this interval not displayed. Lab Results  Component Value Date   TSH 2.048 10/27/2016   No results found for: HGBA1C No results found for: CHOL, HDL, LDLCALC, LDLDIRECT, TRIG, CHOLHDL  Significant Diagnostic Results in last 30 days:  No results found.  Assessment/Plan  1. Allergic conjunctivitis and rhinitis, right Start claritin 10 mg daily x 1 week and then daily as needed for allergies. Monitor clinically. Currently no signs of infection    Family/ staff Communication: reviewed care plan with patient and charge nurse.   Labs/tests ordered:  none  Blanchie Serve, MD Internal Medicine [facility] [address] [place], [place] [address] Cell Phone (Monday-Friday 8 am - 5 pm): [phone] On Call: [phone] and follow prompts after 5 pm and on weekends Office Phone: [phone] Office Fax: [phone]

## 2017-01-01 DIAGNOSIS — I679 Cerebrovascular disease, unspecified: Secondary | ICD-10-CM | POA: Diagnosis not present

## 2017-01-01 DIAGNOSIS — Z4789 Encounter for other orthopedic aftercare: Secondary | ICD-10-CM | POA: Diagnosis not present

## 2017-01-01 DIAGNOSIS — G8911 Acute pain due to trauma: Secondary | ICD-10-CM | POA: Diagnosis not present

## 2017-01-01 DIAGNOSIS — I5040 Unspecified combined systolic (congestive) and diastolic (congestive) heart failure: Secondary | ICD-10-CM | POA: Diagnosis not present

## 2017-01-01 LAB — POCT INR: INR: 1.8 — AB (ref <=1.1)

## 2017-01-01 LAB — PROTIME-INR: Protime: 19.3 — AB (ref 10.0–13.8)

## 2017-01-03 ENCOUNTER — Non-Acute Institutional Stay (SKILLED_NURSING_FACILITY): Payer: PPO | Admitting: Nurse Practitioner

## 2017-01-03 ENCOUNTER — Other Ambulatory Visit: Payer: Self-pay | Admitting: *Deleted

## 2017-01-03 ENCOUNTER — Encounter: Payer: Self-pay | Admitting: Nurse Practitioner

## 2017-01-03 DIAGNOSIS — R05 Cough: Secondary | ICD-10-CM

## 2017-01-03 DIAGNOSIS — F0390 Unspecified dementia without behavioral disturbance: Secondary | ICD-10-CM

## 2017-01-03 DIAGNOSIS — R059 Cough, unspecified: Secondary | ICD-10-CM | POA: Insufficient documentation

## 2017-01-03 NOTE — Assessment & Plan Note (Addendum)
CXR AP and lateral views to evaluate further, Mucinex 600mg  po bid x 3 days. update CBC/diff and CMP. Speech therapist to evaluate for possible dysphagia.

## 2017-01-03 NOTE — Assessment & Plan Note (Signed)
She has dementia, resides in memory care units, takes Mamentine 28mg  po daily to preserve memory.

## 2017-01-03 NOTE — Progress Notes (Signed)
Location:  [facility] Room Number: 107 Place of Service:  SNF (31) Provider:  Lennie Odor Diyari Cherne NP  Blanchie Serve, MD  Patient Care Team: Blanchie Serve, MD as PCP - General (Internal Medicine) Shiri Hodapp X, NP as Nurse Practitioner (Nurse Practitioner) Wilford Corner, MD as Consulting Physician (Gastroenterology) Sanda Klein, MD as Consulting Physician (Cardiology)  Extended Emergency Contact Information Primary Emergency Contact: [name],[name] Address: [place] [address] [name] of [place] Phone: [phone] Mobile Phone: [phone] Relation: Daughter  Code Status:  DNR Goals of care: Advanced Directive information Advanced Directives 01/03/2017  Does Patient Have a Medical Advance Directive? Yes  Type of Paramedic of [place];Out of facility DNR (pink MOST or yellow form)  Does patient want to make changes to medical advance directive? -  Copy of [place] in Chart? Yes  Pre-existing out of facility DNR order (yellow form or pink MOST form) Yellow form placed in chart (order not valid for inpatient use)     Chief Complaint  Patient presents with  . Acute Visit    low grade temp and cough    HPI:  Pt is a 83 y.o. female seen today for acute visit, the patient was noted to have loud congested cough, coughs up thick while mucous, hoarseness. Staff reported low grade temperature in the past a few days. Shed denied chest pain, palpitation, SOB, or wheezing. No O2 desaturation noted.    She has dementia, resides in memory care units, takes Mamentine 28mg  po daily to preserve memory.    Past Medical History:  Diagnosis Date  . Anemia, unspecified   . Anorexia   . Anxiety   . Arthritis   . Atherosclerotic cerebrovascular disease   . Backache, unspecified   . Cataract   . Cholelithiasis   . Chronic kidney disease, unspecified   . Closed fracture of right inferior  pubic ramus ([place]) 11/12/2013   11/10/13 s/p fall, ED eval  X-ray R hip  1. Possible nondisplaced right inferior pubic ramus fracture.  2. No femur fracture or dislocation   02/06/14 dc prn Motrin and Norco-not used    . Depression   . Diaphragmatic hernia without mention of obstruction or gangrene   . Diseases of lips 03/26/2013  . Diverticulosis of colon (without mention of hemorrhage)   . Hemorrhage of rectum and anus 03/26/2013  . Hyperlipidemia   . Hypertension   . Insomnia, unspecified   . Laceration of occipital scalp 11/12/2013  . Macular degeneration 06/28/2013  . Macular degeneration (senile) of retina, unspecified   . Osteoporosis   . Other malaise and fatigue   . Other sleep disturbances   . Other specified cardiac dysrhythmias(427.89)   . Other specified personal history presenting hazards to health(V15.89)   . Other symptoms involving cardiovascular system   . Pancreatitis   . Personal history of other diseases of circulatory system   . Personal history of other diseases of digestive system    pancreatitis from gallstones  . Personal history of other diseases of respiratory system   . Personal history of traumatic fracture   . Rosacea   . Senile osteoporosis    with old T11 fracture  . Thyroid disease   . Unspecified disorders of arteries and arterioles   . Unspecified hearing loss   . Unspecified hemorrhoids without mention of complication    Past Surgical History:  Procedure Laterality Date  .  ABDOMINAL HYSTERECTOMY     and BSO fibroids  . COLONOSCOPY  05/23/2007  . EYE LID LIFT Right 09/2016   OD  . HIP ARTHROPLASTY Left 10/31/2016   Procedure: ARTHROPLASTY  HIP (HEMIARTHROPLASTY);  Surgeon: Paralee Cancel, MD;  Location: [facility];  Service: Orthopedics;  Laterality: Left;  . JOINT REPLACEMENT  1996  . TOTAL HIP ARTHROPLASTY Right     Allergies  Allergen Reactions  . Ace Inhibitors Cough  . Mirtazapine Other (See Comments)    Nightmares   . Penicillins Rash    Has  patient had a PCN reaction causing immediate rash, facial/tongue/throat swelling, SOB or lightheadedness with hypotension: Yes Has patient had a PCN reaction causing severe rash involving mucus membranes or skin necrosis: Unknown Has patient had a PCN reaction that required hospitalization: Unknown Has patient had a PCN reaction occurring within the last 10 years: Unknown If all of the above answers are "NO", then may proceed with Cephalosporin use.     Outpatient Encounter Medications as of 01/03/2017  Medication Sig  . acetaminophen (TYLENOL) 500 MG tablet Take 1,000 mg by mouth every 8 (eight) hours as needed for mild pain.  Marland Kitchen alum & mag hydroxide-simeth (MAALOX/MYLANTA) 200-200-20 MG/5ML suspension Take 30 mLs by mouth every 4 (four) hours as needed for indigestion.  Marland Kitchen BYSTOLIC 10 MG tablet Take 10 mg by mouth daily.   . cholecalciferol 2000 units TABS Take 1 tablet (2,000 Units total) by mouth daily.  . clindamycin (CLEOCIN) 150 MG capsule Take 600 mg by mouth See admin instructions. ONE HOUR PRIOR TO DENTAL APPOINTMENTS  . diltiazem (CARDIZEM) 30 MG tablet Take 30 mg by mouth 4 (four) times daily.  Marland Kitchen escitalopram (LEXAPRO) 5 MG tablet Take 5 mg by mouth every other day.   . furosemide (LASIX) 20 MG tablet Take 20 mg by mouth as needed for edema (to be given with the potassium 10 meq).   . hydrocortisone cream 1 % Apply 1 application topically 2 (two) times daily. TO HEMORRHOIDS  . levothyroxine (SYNTHROID, LEVOTHROID) 25 MCG tablet Take 25 mcg by mouth daily before breakfast.   . Lidocaine (ASPERCREME LIDOCAINE) 4 % PTCH Apply 1 patch topically at bedtime. APPLY TO LOWER BACK AND REMOVE IN THE MORNING  . memantine (NAMENDA XR) 28 MG CP24 24 hr capsule Take 28 mg by mouth.  . Nutritional Supplements (BENECALORIE PO) Take 1.5 oz by mouth 2 (two) times daily.   . ondansetron (ZOFRAN) 4 MG tablet Take 1 tablet (4 mg total) by mouth every 6 (six) hours as needed for nausea.  . polyethylene  glycol (MIRALAX / GLYCOLAX) packet Take 17 g by mouth every other day. For constipation and hold for loose stools  . polyvinyl alcohol (LIQUIFILM TEARS) 1.4 % ophthalmic solution Place 1 drop into both eyes 4 (four) times daily.   . potassium chloride (K-DUR,KLOR-CON) 10 MEQ tablet Take 10 mEq by mouth as needed (for edema/if taking edema).   . warfarin (COUMADIN) 2 MG tablet Take 2 mg by mouth daily.   No facility-administered encounter medications on file as of 01/03/2017.    ROS was provided with assistance of staff Review of Systems  Constitutional: Positive for fatigue and fever. Negative for activity change, appetite change, chills and diaphoresis.  HENT: Positive for congestion and hearing loss. Negative for rhinorrhea, sore throat, trouble swallowing and voice change.   Eyes: Negative for visual disturbance.  Respiratory: Positive for cough. Negative for apnea, choking, chest tightness, shortness of breath and wheezing.  Cardiovascular: Negative for chest pain, palpitations and leg swelling.  Gastrointestinal: Negative for abdominal distention, abdominal pain, constipation, diarrhea, nausea and vomiting.  Musculoskeletal: Positive for gait problem.       Wheelchair to get around.   Skin: Negative for rash.  Neurological: Negative for tremors, facial asymmetry, speech difficulty, weakness and headaches.  Psychiatric/Behavioral: Positive for confusion. Negative for agitation and behavioral problems.    Immunization History  Administered Date(s) Administered  . DTaP 10/12/2012  . Influenza-Unspecified 11/22/2012, 12/26/2013, 11/12/2014, 12/09/2015, 12/14/2016  . PPD Test 12/06/2013  . Pneumococcal Polysaccharide-23 09/22/2000  . Td 07/08/2005  . Tdap 11/10/2013   Pertinent  Health Maintenance Due  Topic Date Due  . PNA vac Low Risk Adult (2 of 2 - PCV13) 02/22/2017 (Originally 09/22/2001)  . INFLUENZA VACCINE  Completed  . DEXA SCAN  Completed   Fall Risk  10/22/2016  01/13/2015 06/28/2013  Falls in the past year? No No No  Risk for fall due to : - Impaired balance/gait -   Functional Status Survey:    Vitals:   01/03/17 1553  BP: 140/60  Pulse: 88  Resp: 20  Temp: (!) 97.3 F (36.3 C)  SpO2: 97%  Weight: 116 lb 3.2 oz (52.7 kg)  Height: 5' (1.524 m)   Body mass index is 22.69 kg/m. Physical Exam  Constitutional: She appears well-developed and well-nourished. No distress.  HENT:  Head: Normocephalic and atraumatic.  Eyes: Conjunctivae and EOM are normal. Pupils are equal, round, and reactive to light.  Neck: Normal range of motion. Neck supple. No JVD present. No thyromegaly present.  Cardiovascular: Normal rate, regular rhythm and normal heart sounds.  No murmur heard. Pulmonary/Chest: Effort normal. She has no wheezes. She has rales.  Abdominal: Soft. Bowel sounds are normal.  Musculoskeletal: She exhibits no edema.  Neurological: She is alert.  Oriented to person  Skin: Skin is warm and dry. No rash noted. She is not diaphoretic.  Psychiatric: She has a normal mood and affect. Her behavior is normal.    Labs reviewed: Recent Labs    10/31/16 0236 11/01/16 0353 11/02/16 0445 11/11/16 01/04/17  NA 140 140 139 142 141  K 3.7 3.8 3.9 3.6 3.8  CL 112* 113* 112*  --   --   CO2 21* 21* 20*  --   --   GLUCOSE 108* 149* 110*  --   --   BUN 27* 32* 24* 19 28*  CREATININE 0.90 0.90 0.80 0.8 0.8  CALCIUM 8.2* 8.3* 8.3*  --   --    Recent Labs    06/29/16 08/31/16 10/28/16 0329 01/04/17  AST 29 19  --  17  ALT 14 12  --  10  ALKPHOS 50 70  --  75  ALBUMIN  --   --  3.0*  --    Recent Labs    10/27/16 1715  11/01/16 0353 11/02/16 0445 11/03/16 0318 11/11/16 01/04/17  WBC 9.6   < > 6.0 7.4 8.4 6.4 5.5  NEUTROABS 8.4*  --   --   --   --   --   --   HGB 12.4   < > 10.0* 9.5* 10.3* 9.8* 11.3*  HCT 39.1   < > 31.2* 29.8* 32.6* 29* 33*  MCV 94.7   < > 94.8 96.1 95.0  --   --   PLT 174   < > 179 163 197 419* 254   < > = values  in this interval not displayed.  Lab Results  Component Value Date   TSH 2.048 10/27/2016   No results found for: HGBA1C No results found for: CHOL, HDL, LDLCALC, LDLDIRECT, TRIG, CHOLHDL  Significant Diagnostic Results in last 30 days:  No results found.  Assessment/Plan Cough CXR AP and lateral views to evaluate further, Mucinex 600mg  po bid x 3 days. update CBC/diff and CMP. Speech therapist to evaluate for possible dysphagia.   Senile dementia She has dementia, resides in memory care units, takes Mamentine 28mg  po daily to preserve memory.        Family/ staff Communication: plan of care reviewed with the patient and charge nurse.   Labs/tests ordered:  CBC/diff, CMP, CXR ap/lateral views.   Time spend 25 minutes.

## 2017-01-04 DIAGNOSIS — I5040 Unspecified combined systolic (congestive) and diastolic (congestive) heart failure: Secondary | ICD-10-CM | POA: Diagnosis not present

## 2017-01-04 DIAGNOSIS — R05 Cough: Secondary | ICD-10-CM | POA: Diagnosis not present

## 2017-01-04 DIAGNOSIS — I481 Persistent atrial fibrillation: Secondary | ICD-10-CM | POA: Diagnosis not present

## 2017-01-04 DIAGNOSIS — R000 Tachycardia, unspecified: Secondary | ICD-10-CM | POA: Diagnosis not present

## 2017-01-04 LAB — CBC AND DIFFERENTIAL
HCT: 33 — AB (ref 36–46)
HEMOGLOBIN: 11.3 — AB (ref 12.0–16.0)
PLATELETS: 254 (ref 150–399)
WBC: 5.5

## 2017-01-04 LAB — BASIC METABOLIC PANEL
BUN: 28 — AB (ref 4–21)
Creatinine: 0.8 (ref <=1.1)
GLUCOSE: 84
POTASSIUM: 3.8 (ref 3.4–5.3)
Sodium: 141 (ref 137–147)

## 2017-01-04 LAB — HEPATIC FUNCTION PANEL
ALT: 10 (ref 7–35)
AST: 17 (ref 13–35)
Alkaline Phosphatase: 75 (ref 25–125)
BILIRUBIN, TOTAL: 0.3

## 2017-01-05 ENCOUNTER — Other Ambulatory Visit: Payer: Self-pay | Admitting: *Deleted

## 2017-01-06 DIAGNOSIS — I10 Essential (primary) hypertension: Secondary | ICD-10-CM | POA: Diagnosis not present

## 2017-01-06 DIAGNOSIS — Z9181 History of falling: Secondary | ICD-10-CM | POA: Diagnosis not present

## 2017-01-06 DIAGNOSIS — R262 Difficulty in walking, not elsewhere classified: Secondary | ICD-10-CM | POA: Diagnosis not present

## 2017-01-06 DIAGNOSIS — F418 Other specified anxiety disorders: Secondary | ICD-10-CM | POA: Diagnosis not present

## 2017-01-06 DIAGNOSIS — K5900 Constipation, unspecified: Secondary | ICD-10-CM | POA: Diagnosis not present

## 2017-01-06 DIAGNOSIS — R413 Other amnesia: Secondary | ICD-10-CM | POA: Diagnosis not present

## 2017-01-06 DIAGNOSIS — F0390 Unspecified dementia without behavioral disturbance: Secondary | ICD-10-CM | POA: Diagnosis not present

## 2017-01-06 DIAGNOSIS — M8580 Other specified disorders of bone density and structure, unspecified site: Secondary | ICD-10-CM | POA: Diagnosis not present

## 2017-01-06 DIAGNOSIS — R41841 Cognitive communication deficit: Secondary | ICD-10-CM | POA: Diagnosis not present

## 2017-01-06 DIAGNOSIS — L299 Pruritus, unspecified: Secondary | ICD-10-CM | POA: Diagnosis not present

## 2017-01-06 DIAGNOSIS — E039 Hypothyroidism, unspecified: Secondary | ICD-10-CM | POA: Diagnosis not present

## 2017-01-06 DIAGNOSIS — I5040 Unspecified combined systolic (congestive) and diastolic (congestive) heart failure: Secondary | ICD-10-CM | POA: Diagnosis not present

## 2017-01-06 DIAGNOSIS — R2689 Other abnormalities of gait and mobility: Secondary | ICD-10-CM | POA: Diagnosis not present

## 2017-01-06 DIAGNOSIS — R1312 Dysphagia, oropharyngeal phase: Secondary | ICD-10-CM | POA: Diagnosis not present

## 2017-01-06 DIAGNOSIS — R488 Other symbolic dysfunctions: Secondary | ICD-10-CM | POA: Diagnosis not present

## 2017-01-06 DIAGNOSIS — M899 Disorder of bone, unspecified: Secondary | ICD-10-CM | POA: Diagnosis not present

## 2017-01-06 DIAGNOSIS — I679 Cerebrovascular disease, unspecified: Secondary | ICD-10-CM | POA: Diagnosis not present

## 2017-01-06 DIAGNOSIS — R351 Nocturia: Secondary | ICD-10-CM | POA: Diagnosis not present

## 2017-01-06 DIAGNOSIS — R296 Repeated falls: Secondary | ICD-10-CM | POA: Diagnosis not present

## 2017-01-06 DIAGNOSIS — R29898 Other symptoms and signs involving the musculoskeletal system: Secondary | ICD-10-CM | POA: Diagnosis not present

## 2017-01-11 DIAGNOSIS — I679 Cerebrovascular disease, unspecified: Secondary | ICD-10-CM | POA: Diagnosis not present

## 2017-01-11 DIAGNOSIS — I5040 Unspecified combined systolic (congestive) and diastolic (congestive) heart failure: Secondary | ICD-10-CM | POA: Diagnosis not present

## 2017-01-21 ENCOUNTER — Non-Acute Institutional Stay (SKILLED_NURSING_FACILITY): Payer: PPO | Admitting: Internal Medicine

## 2017-01-21 ENCOUNTER — Encounter: Payer: Self-pay | Admitting: Internal Medicine

## 2017-01-21 DIAGNOSIS — E039 Hypothyroidism, unspecified: Secondary | ICD-10-CM | POA: Diagnosis not present

## 2017-01-21 DIAGNOSIS — I480 Paroxysmal atrial fibrillation: Secondary | ICD-10-CM

## 2017-01-21 DIAGNOSIS — Z7901 Long term (current) use of anticoagulants: Secondary | ICD-10-CM | POA: Diagnosis not present

## 2017-01-21 DIAGNOSIS — F0390 Unspecified dementia without behavioral disturbance: Secondary | ICD-10-CM | POA: Diagnosis not present

## 2017-01-21 DIAGNOSIS — I10 Essential (primary) hypertension: Secondary | ICD-10-CM | POA: Diagnosis not present

## 2017-01-21 DIAGNOSIS — K5901 Slow transit constipation: Secondary | ICD-10-CM

## 2017-01-21 DIAGNOSIS — Z5181 Encounter for therapeutic drug level monitoring: Secondary | ICD-10-CM

## 2017-01-21 NOTE — Progress Notes (Signed)
Location:  [facility] Room Number: [place]:  SNF [phone]) Provider:  Blanchie Serve MD  Blanchie Serve, MD  Patient Care Team: Blanchie Serve, MD as PCP - General (Internal Medicine) Mast, Man X, NP as Nurse Practitioner (Nurse Practitioner) Wilford Corner, MD as Consulting Physician (Gastroenterology) Sanda Klein, MD as Consulting Physician (Cardiology)  Extended Emergency Contact Information Primary Emergency Contact: [name],Pat Address: [place] [address] [name] of [place] Phone: [phone] Mobile Phone: [phone] Relation: Daughter  Code Status:  DNR Goals of care: Advanced Directive information Advanced Directives 01/03/2017  Does Patient Have a Medical Advance Directive? Yes  Type of Paramedic of [place];Out of facility DNR (pink MOST or yellow form)  Does patient want to make changes to medical advance directive? -  Copy of [place] in Chart? Yes  Pre-existing out of facility DNR order (yellow form or pink MOST form) Yellow form placed in chart (order not valid for inpatient use)     Chief Complaint  Patient presents with  . Medical Management of Chronic Issues    HPI:  Pt is a 83 y.o. female seen today for medical management of chronic diseases. She is seen with her caregiver. She has advanced dementia and does not participate in conversation. She gets around on her wheelchair. She has not been using her walker. No recent fall reported. She now needs 2 person assitance along with hoyer lift for transfer. She feeds herself at times but otherwise needs assistance. She is incontinent with her bowel and bladder. She remains a high risk for aspiration and continues to cough and brings up white mucus at times. She is unable to express her needs. She needs assistance with all her ADLs. She does not participate in HPI and ROS.    Past Medical History:    Diagnosis Date  . Anemia, unspecified   . Anorexia   . Anxiety   . Arthritis   . Atherosclerotic cerebrovascular disease   . Backache, unspecified   . Cataract   . Cholelithiasis   . Chronic kidney disease, unspecified   . Closed fracture of right inferior pubic ramus ([place]) 11/12/2013   11/10/13 s/p fall, ED eval  X-ray R hip  1. Possible nondisplaced right inferior pubic ramus fracture.  2. No femur fracture or dislocation   02/06/14 dc prn Motrin and Norco-not used    . Depression   . Diaphragmatic hernia without mention of obstruction or gangrene   . Diseases of lips 03/26/2013  . Diverticulosis of colon (without mention of hemorrhage)   . Hemorrhage of rectum and anus 03/26/2013  . Hyperlipidemia   . Hypertension   . Insomnia, unspecified   . Laceration of occipital scalp 11/12/2013  . Macular degeneration 06/28/2013  . Macular degeneration (senile) of retina, unspecified   . Osteoporosis   . Other malaise and fatigue   . Other sleep disturbances   . Other specified cardiac dysrhythmias(427.89)   . Other specified personal history presenting hazards to health(V15.89)   . Other symptoms involving cardiovascular system   . Pancreatitis   . Personal history of other diseases of circulatory system   . Personal history of other diseases of digestive system    pancreatitis from gallstones  . Personal history of other diseases of respiratory system   . Personal history of traumatic fracture   . Rosacea   . Senile osteoporosis  with old T11 fracture  . Thyroid disease   . Unspecified disorders of arteries and arterioles   . Unspecified hearing loss   . Unspecified hemorrhoids without mention of complication    Past Surgical History:  Procedure Laterality Date  . ABDOMINAL HYSTERECTOMY     and BSO fibroids  . COLONOSCOPY  05/23/2007  . EYE LID LIFT Right 09/2016   OD  . HIP ARTHROPLASTY Left 10/31/2016   Procedure: ARTHROPLASTY  HIP (HEMIARTHROPLASTY);  Surgeon: Paralee Cancel,  MD;  Location: [facility];  Service: Orthopedics;  Laterality: Left;  . JOINT REPLACEMENT  1996  . TOTAL HIP ARTHROPLASTY Right     Allergies  Allergen Reactions  . Ace Inhibitors Cough  . Mirtazapine Other (See Comments)    Nightmares   . Penicillins Rash    Has patient had a PCN reaction causing immediate rash, facial/tongue/throat swelling, SOB or lightheadedness with hypotension: Yes Has patient had a PCN reaction causing severe rash involving mucus membranes or skin necrosis: Unknown Has patient had a PCN reaction that required hospitalization: Unknown Has patient had a PCN reaction occurring within the last 10 years: Unknown If all of the above answers are "NO", then may proceed with Cephalosporin use.     Outpatient Encounter Medications as of 01/21/2017  Medication Sig  . acetaminophen (TYLENOL) 500 MG tablet Take 1,000 mg by mouth every 8 (eight) hours as needed for mild pain.  Marland Kitchen alum & mag hydroxide-simeth (MAALOX/MYLANTA) 200-200-20 MG/5ML suspension Take 30 mLs by mouth every 4 (four) hours as needed for indigestion.  Marland Kitchen BYSTOLIC 10 MG tablet Take 10 mg by mouth daily.   . cholecalciferol 2000 units TABS Take 1 tablet (2,000 Units total) by mouth daily.  . clindamycin (CLEOCIN) 150 MG capsule Take 600 mg by mouth See admin instructions. ONE HOUR PRIOR TO DENTAL APPOINTMENTS  . diltiazem (CARDIZEM) 30 MG tablet Take 30 mg by mouth 4 (four) times daily.  Marland Kitchen escitalopram (LEXAPRO) 5 MG tablet Take 5 mg by mouth every other day.   . furosemide (LASIX) 20 MG tablet Take 20 mg by mouth as needed for edema (to be given with the potassium 10 meq).   . hydrocortisone cream 1 % Apply 1 application topically 2 (two) times daily. TO HEMORRHOIDS  . levothyroxine (SYNTHROID, LEVOTHROID) 25 MCG tablet Take 25 mcg by mouth daily before breakfast.   . Lidocaine (ASPERCREME LIDOCAINE) 4 % PTCH Apply 1 patch topically at bedtime. APPLY TO LOWER BACK AND REMOVE IN THE MORNING  . memantine (NAMENDA  XR) 28 MG CP24 24 hr capsule Take 28 mg by mouth.  . Nutritional Supplements (BENECALORIE PO) Take 1.5 oz by mouth 2 (two) times daily.   . ondansetron (ZOFRAN) 4 MG tablet Take 1 tablet (4 mg total) by mouth every 6 (six) hours as needed for nausea.  . polyethylene glycol (MIRALAX / GLYCOLAX) packet Take 17 g by mouth every other day. For constipation and hold for loose stools  . polyvinyl alcohol (LIQUIFILM TEARS) 1.4 % ophthalmic solution Place 1 drop into both eyes 4 (four) times daily.   . potassium chloride (K-DUR,KLOR-CON) 10 MEQ tablet Take 10 mEq by mouth as needed (for edema/if taking edema).   . warfarin (COUMADIN) 2 MG tablet Take 2 mg by mouth daily.   No facility-administered encounter medications on file as of 01/21/2017.     Review of Systems  Unable to perform ROS: Dementia    Immunization History  Administered Date(s) Administered  . DTaP 10/12/2012  .  Influenza-Unspecified 11/22/2012, 12/26/2013, 11/12/2014, 12/09/2015, 12/14/2016  . PPD Test 12/06/2013  . Pneumococcal Polysaccharide-23 09/22/2000  . Td 07/08/2005  . Tdap 11/10/2013   Pertinent  Health Maintenance Due  Topic Date Due  . PNA vac Low Risk Adult (2 of 2 - PCV13) 02/22/2017 (Originally 09/22/2001)  . INFLUENZA VACCINE  Completed  . DEXA SCAN  Completed   Fall Risk  10/22/2016 01/13/2015 06/28/2013  Falls in the past year? No No No  Risk for fall due to : - Impaired balance/gait -   Functional Status Survey:    Vitals:   01/21/17 1447  BP: 140/70  Pulse: 70  Resp: 20  Temp: (!) 97.3 F (36.3 C)  SpO2: 97%  Weight: 112 lb 6.4 oz (51 kg)  Height: 5' (1.524 m)   Body mass index is 21.95 kg/m.   Wt Readings from Last 3 Encounters:  01/21/17 112 lb 6.4 oz (51 kg)  01/03/17 116 lb 3.2 oz (52.7 kg)  12/23/16 114 lb 12.8 oz (52.1 kg)   Physical Exam  Constitutional: No distress.  Thin built and frail, continues to lose weight  HENT:  Head: Normocephalic and atraumatic.  Mouth/Throat:  Oropharynx is clear and moist.  Eyes: Conjunctivae and EOM are normal. Pupils are equal, round, and reactive to light. Right eye exhibits no discharge. Left eye exhibits no discharge.  Cardiovascular:  Irregular heart rate  Pulmonary/Chest: Effort normal. She has no wheezes. She has no rales.  Decreased air movement to lung bases  Abdominal: Soft. Bowel sounds are normal. There is no tenderness.  Musculoskeletal: She exhibits no edema.  Able to move all 4 extremities, weakness to both her legs, does not follow command to evaluate strength with advanced dementia, no leg edema, arthritis changes present  Lymphadenopathy:    She has no cervical adenopathy.  Neurological: She is alert.  Oriented only to self  Skin: Skin is warm and dry. She is not diaphoretic.  Psychiatric: She has a normal mood and affect.    Labs reviewed: Recent Labs    10/31/16 0236 11/01/16 0353 11/02/16 0445 11/11/16 01/04/17  NA 140 140 139 142 141  K 3.7 3.8 3.9 3.6 3.8  CL 112* 113* 112*  --   --   CO2 21* 21* 20*  --   --   GLUCOSE 108* 149* 110*  --   --   BUN 27* 32* 24* 19 28*  CREATININE 0.90 0.90 0.80 0.8 0.8  CALCIUM 8.2* 8.3* 8.3*  --   --    Recent Labs    06/29/16 08/31/16 10/28/16 0329 01/04/17  AST 29 19  --  17  ALT 14 12  --  10  ALKPHOS 50 70  --  75  ALBUMIN  --   --  3.0*  --    Recent Labs    10/27/16 1715  11/01/16 0353 11/02/16 0445 11/03/16 0318 11/11/16 01/04/17  WBC 9.6   < > 6.0 7.4 8.4 6.4 5.5  NEUTROABS 8.4*  --   --   --   --   --   --   HGB 12.4   < > 10.0* 9.5* 10.3* 9.8* 11.3*  HCT 39.1   < > 31.2* 29.8* 32.6* 29* 33*  MCV 94.7   < > 94.8 96.1 95.0  --   --   PLT 174   < > 179 163 197 419* 254   < > = values in this interval not displayed.   Lab Results  Component  Value Date   TSH 2.048 10/27/2016   No results found for: HGBA1C No results found for: CHOL, HDL, LDLCALC, LDLDIRECT, TRIG, CHOLHDL  Significant Diagnostic Results in last 30 days:  No results  found.  Assessment/Plan  afib Controlled heart rate. Continue diltiazem 30 mg qid for rate control and warfarin for stroke prevention. She remains a fall risk.   Advanced dementia Continue memantine and supportive care.. Will need to review goals of care with pt's HCPOA. Social worker is to help set up appointment to review this. Aspiration precautions with her high aspiration risk for now.   Hypothyroidism Lab Results  Component Value Date   TSH 2.048 10/27/2016   Continue levothyroxine 25 mcg daily. Reviewed TSH.   Long term anticoagulation Goal inr 2-3. Continue coumadin for now. No bleed reported. High fall risk.   Hypertension Stable on review. Continue bystolic and diltiazem and monitor  Chronic constipation Continue miralax qod and her cortisone cream for hemorrhoids. No rectal bleed reported.    Family/ staff Communication: reviewed care plan with patient and charge nurse.    Labs/tests ordered:  None. Will need care plan meeting with HCPOA/ daughter to review goals of care.    Blanchie Serve, MD Internal Medicine [facility] [address] [place], [place] [address] Cell Phone (Monday-Friday 8 am - 5 pm): [phone] On Call: [phone] and follow prompts after 5 pm and on weekends Office Phone: [phone] Office Fax: [phone]

## 2017-01-23 DIAGNOSIS — F418 Other specified anxiety disorders: Secondary | ICD-10-CM | POA: Diagnosis not present

## 2017-01-23 DIAGNOSIS — M899 Disorder of bone, unspecified: Secondary | ICD-10-CM | POA: Diagnosis not present

## 2017-01-23 DIAGNOSIS — R488 Other symbolic dysfunctions: Secondary | ICD-10-CM | POA: Diagnosis not present

## 2017-01-23 DIAGNOSIS — Z4789 Encounter for other orthopedic aftercare: Secondary | ICD-10-CM | POA: Diagnosis not present

## 2017-01-23 DIAGNOSIS — R41841 Cognitive communication deficit: Secondary | ICD-10-CM | POA: Diagnosis not present

## 2017-01-23 DIAGNOSIS — R296 Repeated falls: Secondary | ICD-10-CM | POA: Diagnosis not present

## 2017-01-23 DIAGNOSIS — Z9181 History of falling: Secondary | ICD-10-CM | POA: Diagnosis not present

## 2017-01-23 DIAGNOSIS — L299 Pruritus, unspecified: Secondary | ICD-10-CM | POA: Diagnosis not present

## 2017-01-23 DIAGNOSIS — R2681 Unsteadiness on feet: Secondary | ICD-10-CM | POA: Diagnosis not present

## 2017-01-23 DIAGNOSIS — R413 Other amnesia: Secondary | ICD-10-CM | POA: Diagnosis not present

## 2017-01-23 DIAGNOSIS — I10 Essential (primary) hypertension: Secondary | ICD-10-CM | POA: Diagnosis not present

## 2017-01-23 DIAGNOSIS — R262 Difficulty in walking, not elsewhere classified: Secondary | ICD-10-CM | POA: Diagnosis not present

## 2017-01-23 DIAGNOSIS — E039 Hypothyroidism, unspecified: Secondary | ICD-10-CM | POA: Diagnosis not present

## 2017-01-23 DIAGNOSIS — K5900 Constipation, unspecified: Secondary | ICD-10-CM | POA: Diagnosis not present

## 2017-01-23 DIAGNOSIS — M6281 Muscle weakness (generalized): Secondary | ICD-10-CM | POA: Diagnosis not present

## 2017-01-23 DIAGNOSIS — R351 Nocturia: Secondary | ICD-10-CM | POA: Diagnosis not present

## 2017-01-23 DIAGNOSIS — M8580 Other specified disorders of bone density and structure, unspecified site: Secondary | ICD-10-CM | POA: Diagnosis not present

## 2017-01-23 DIAGNOSIS — F0390 Unspecified dementia without behavioral disturbance: Secondary | ICD-10-CM | POA: Diagnosis not present

## 2017-01-25 DIAGNOSIS — I679 Cerebrovascular disease, unspecified: Secondary | ICD-10-CM | POA: Diagnosis not present

## 2017-01-25 DIAGNOSIS — I5040 Unspecified combined systolic (congestive) and diastolic (congestive) heart failure: Secondary | ICD-10-CM | POA: Diagnosis not present

## 2017-02-01 DIAGNOSIS — Z7901 Long term (current) use of anticoagulants: Secondary | ICD-10-CM | POA: Diagnosis not present

## 2017-02-01 DIAGNOSIS — I481 Persistent atrial fibrillation: Secondary | ICD-10-CM | POA: Diagnosis not present

## 2017-02-03 ENCOUNTER — Non-Acute Institutional Stay (SKILLED_NURSING_FACILITY): Payer: PPO | Admitting: Internal Medicine

## 2017-02-03 ENCOUNTER — Encounter: Payer: Self-pay | Admitting: Internal Medicine

## 2017-02-03 DIAGNOSIS — Z7189 Other specified counseling: Secondary | ICD-10-CM | POA: Insufficient documentation

## 2017-02-03 DIAGNOSIS — R001 Bradycardia, unspecified: Secondary | ICD-10-CM

## 2017-02-03 DIAGNOSIS — F339 Major depressive disorder, recurrent, unspecified: Secondary | ICD-10-CM | POA: Diagnosis not present

## 2017-02-03 NOTE — Progress Notes (Signed)
Location:  [facility] Room Number: [place]:  SNF ([phone]) Provider:  Blanchie Serve, MD  Blanchie Serve, MD  Patient Care Team: Blanchie Serve, MD as PCP - General (Internal Medicine) Mast, Man X, NP as Nurse Practitioner (Nurse Practitioner) Wilford Corner, MD as Consulting Physician (Gastroenterology) Sanda Klein, MD as Consulting Physician (Cardiology)  Extended Emergency Contact Information Primary Emergency Contact: [name],Pat Address: [place] [address] [name] of [place] Phone: [phone] Mobile Phone: [phone] Relation: Daughter  Code Status:  DNR  Goals of care: Advanced Directive information Advanced Directives 02/03/2017  Does Patient Have a Medical Advance Directive? Yes  Type of Paramedic of [place];Out of facility DNR (pink MOST or yellow form)  Does patient want to make changes to medical advance directive? No - Patient declined  Copy of [place] in Chart? Yes  Pre-existing out of facility DNR order (yellow form or pink MOST form) Yellow form placed in chart (order not valid for inpatient use)     Chief Complaint  Patient presents with  . Acute Visit    Advanced Care Plan     HPI:  Pt is a 83 y.o. female seen today for an acute visit. Her oral intake has been poor. She participates minimally with therapy. She now needs assistance with her transfer and is under total care. Her daughter and son are present this visit to review her goals of care. Her heart rate has been in 50s to 60s. Patient has advanced dementia and does not participate in HPI and ROS. Mood stable.    Past Medical History:  Diagnosis Date  . Anemia, unspecified   . Anorexia   . Anxiety   . Arthritis   . Atherosclerotic cerebrovascular disease   . Backache, unspecified   . Cataract   . Cholelithiasis   . Chronic kidney disease, unspecified   . Closed fracture  of right inferior pubic ramus ([place]) 11/12/2013   11/10/13 s/p fall, ED eval  X-ray R hip  1. Possible nondisplaced right inferior pubic ramus fracture.  2. No femur fracture or dislocation   02/06/14 dc prn Motrin and Norco-not used    . Depression   . Diaphragmatic hernia without mention of obstruction or gangrene   . Diseases of lips 03/26/2013  . Diverticulosis of colon (without mention of hemorrhage)   . Hemorrhage of rectum and anus 03/26/2013  . Hyperlipidemia   . Hypertension   . Insomnia, unspecified   . Laceration of occipital scalp 11/12/2013  . Macular degeneration 06/28/2013  . Macular degeneration (senile) of retina, unspecified   . Osteoporosis   . Other malaise and fatigue   . Other sleep disturbances   . Other specified cardiac dysrhythmias(427.89)   . Other specified personal history presenting hazards to health(V15.89)   . Other symptoms involving cardiovascular system   . Pancreatitis   . Personal history of other diseases of circulatory system   . Personal history of other diseases of digestive system    pancreatitis from gallstones  . Personal history of other diseases of respiratory system   . Personal history of traumatic fracture   . Rosacea   . Senile osteoporosis    with old T11 fracture  . Thyroid disease   . Unspecified disorders of arteries and arterioles   . Unspecified hearing loss   . Unspecified hemorrhoids without mention of complication    Past  Surgical History:  Procedure Laterality Date  . ABDOMINAL HYSTERECTOMY     and BSO fibroids  . COLONOSCOPY  05/23/2007  . EYE LID LIFT Right 09/2016   OD  . HIP ARTHROPLASTY Left 10/31/2016   Procedure: ARTHROPLASTY  HIP (HEMIARTHROPLASTY);  Surgeon: Paralee Cancel, MD;  Location: [facility];  Service: Orthopedics;  Laterality: Left;  . JOINT REPLACEMENT  1996  . TOTAL HIP ARTHROPLASTY Right     Allergies  Allergen Reactions  . Ace Inhibitors Cough  . Mirtazapine Other (See Comments)    Nightmares   .  Penicillins Rash    Has patient had a PCN reaction causing immediate rash, facial/tongue/throat swelling, SOB or lightheadedness with hypotension: Yes Has patient had a PCN reaction causing severe rash involving mucus membranes or skin necrosis: Unknown Has patient had a PCN reaction that required hospitalization: Unknown Has patient had a PCN reaction occurring within the last 10 years: Unknown If all of the above answers are "NO", then may proceed with Cephalosporin use.     Outpatient Encounter Medications as of 02/03/2017  Medication Sig  . acetaminophen (TYLENOL) 500 MG tablet Take 1,000 mg by mouth every 8 (eight) hours as needed for mild pain.  Marland Kitchen alum & mag hydroxide-simeth (MAALOX/MYLANTA) 200-200-20 MG/5ML suspension Take 30 mLs by mouth every 4 (four) hours as needed for indigestion.  Marland Kitchen BYSTOLIC 10 MG tablet Take 10 mg by mouth daily.   . cholecalciferol 2000 units TABS Take 1 tablet (2,000 Units total) by mouth daily.  . clindamycin (CLEOCIN) 150 MG capsule Take 600 mg by mouth See admin instructions. ONE HOUR PRIOR TO DENTAL APPOINTMENTS  . Dextromethorphan-Guaifenesin (ROBAFEN DM) 10-100 MG/5ML liquid Take 10 mLs by mouth every 6 (six) hours as needed.  . diltiazem (CARDIZEM CD) 120 MG 24 hr capsule Take 120 mg by mouth daily.  Marland Kitchen escitalopram (LEXAPRO) 5 MG tablet Take 5 mg by mouth every other day.   . hydrocortisone cream 1 % Apply 1 application topically 2 (two) times daily. TO HEMORRHOIDS  . levothyroxine (SYNTHROID, LEVOTHROID) 25 MCG tablet Take 25 mcg by mouth daily before breakfast.   . Lidocaine (ASPERCREME LIDOCAINE) 4 % PTCH Apply 1 patch topically at bedtime. APPLY TO LOWER BACK AND REMOVE IN THE MORNING  . loratadine (CLARITIN) 10 MG tablet Take 10 mg by mouth daily as needed for allergies.  . memantine (NAMENDA XR) 28 MG CP24 24 hr capsule Take 28 mg by mouth.  . Nutritional Supplements (BENECALORIE PO) Take 1.5 oz by mouth 2 (two) times daily.   . ondansetron  (ZOFRAN) 4 MG tablet Take 1 tablet (4 mg total) by mouth every 6 (six) hours as needed for nausea.  . polyethylene glycol (MIRALAX / GLYCOLAX) packet Take 17 g by mouth every other day. For constipation and hold for loose stools  . polyvinyl alcohol (LIQUIFILM TEARS) 1.4 % ophthalmic solution Place 1 drop into both eyes 4 (four) times daily.   Marland Kitchen warfarin (COUMADIN) 2 MG tablet Take 2 mg by mouth daily.  . [DISCONTINUED] diltiazem (CARDIZEM) 30 MG tablet Take 30 mg by mouth 4 (four) times daily.  . [DISCONTINUED] furosemide (LASIX) 20 MG tablet Take 20 mg by mouth as needed for edema (to be given with the potassium 10 meq).   . [DISCONTINUED] potassium chloride (K-DUR,KLOR-CON) 10 MEQ tablet Take 10 mEq by mouth as needed (for edema/if taking edema).    No facility-administered encounter medications on file as of 02/03/2017.     Review of Systems  Unable to perform ROS: Dementia (unable to obtain. )    Immunization History  Administered Date(s) Administered  . DTaP 10/12/2012  . Influenza-Unspecified 11/22/2012, 12/26/2013, 11/12/2014, 12/09/2015, 12/14/2016  . PPD Test 12/06/2013  . Pneumococcal Polysaccharide-23 09/22/2000  . Td 07/08/2005  . Tdap 11/10/2013   Pertinent  Health Maintenance Due  Topic Date Due  . PNA vac Low Risk Adult (2 of 2 - PCV13) 02/22/2017 (Originally 09/22/2001)  . INFLUENZA VACCINE  Completed  . DEXA SCAN  Completed   Fall Risk  10/22/2016 01/13/2015 06/28/2013  Falls in the past year? No No No  Risk for fall due to : - Impaired balance/gait -   Functional Status Survey:    Vitals:   02/03/17 1332  BP: 130/66  Pulse: (!) 50  Resp: 16  Temp: 97.6 F (36.4 C)  TempSrc: Oral  SpO2: 94%  Weight: 114 lb 4.8 oz (51.8 kg)  Height: 5' (1.524 m)   Body mass index is 22.32 kg/m. Physical Exam  Constitutional: No distress.  Thin built and frail  HENT:  Head: Normocephalic and atraumatic.  Mouth/Throat: Oropharynx is clear and moist. No oropharyngeal  exudate.  Eyes: Conjunctivae are normal. Pupils are equal, round, and reactive to light. Right eye exhibits no discharge. Left eye exhibits no discharge.  Neck: Neck supple.  Cardiovascular:  Irregular heart rate  Pulmonary/Chest: Effort normal and breath sounds normal.  Musculoskeletal: She exhibits deformity.  Lymphadenopathy:    She has no cervical adenopathy.  Neurological:  Advanced dementia  Skin: She is not diaphoretic.  Psychiatric:  Pleasantly confused    Labs reviewed: Recent Labs    10/31/16 0236 11/01/16 0353 11/02/16 0445 11/11/16 01/04/17  NA 140 140 139 142 141  K 3.7 3.8 3.9 3.6 3.8  CL 112* 113* 112*  --   --   CO2 21* 21* 20*  --   --   GLUCOSE 108* 149* 110*  --   --   BUN 27* 32* 24* 19 28*  CREATININE 0.90 0.90 0.80 0.8 0.8  CALCIUM 8.2* 8.3* 8.3*  --   --    Recent Labs    06/29/16 08/31/16 10/28/16 0329 01/04/17  AST 29 19  --  17  ALT 14 12  --  10  ALKPHOS 50 70  --  75  ALBUMIN  --   --  3.0*  --    Recent Labs    10/27/16 1715  11/01/16 0353 11/02/16 0445 11/03/16 0318 11/11/16 01/04/17  WBC 9.6   < > 6.0 7.4 8.4 6.4 5.5  NEUTROABS 8.4*  --   --   --   --   --   --   HGB 12.4   < > 10.0* 9.5* 10.3* 9.8* 11.3*  HCT 39.1   < > 31.2* 29.8* 32.6* 29* 33*  MCV 94.7   < > 94.8 96.1 95.0  --   --   PLT 174   < > 179 163 197 419* 254   < > = values in this interval not displayed.   Lab Results  Component Value Date   TSH 2.048 10/27/2016   No results found for: HGBA1C No results found for: CHOL, HDL, LDLCALC, LDLDIRECT, TRIG, CHOLHDL  Significant Diagnostic Results in last 30 days:  No results found.  Assessment/Plan  Goals of care discussion Patient is DNR in absence of pulse or breathing. After detailed discussion about her advanced age, medical co-morbidities and advanced dementia, no further hospitalization is desired. Family would  like to focus on comfort measures. Reviewed with family about how a need for antibiotic might  arise if she has an infection and family would like antibiotic if indicated for a defined period. Iv fluids if needed for a defined trial period. No feeding tube is desired. Filled out MOST form from 2:05 pm to 2:30 pm and DNR form.   Depression Stable mood. Currently on escitalopram 5 mg every other day. Discontinue this. Monitor her mood.  Bradycardia Decrease bystolic to 2.5 mg daily. Continue diltiazem 120 mg daily. Monitor BP/HR daily x 2 weeks.    Family/ staff Communication: reviewed care plan with patient and charge nurse.   Labs/tests ordered:  none  Blanchie Serve, MD Internal Medicine [facility] [address] [place], [place] [address] Cell Phone (Monday-Friday 8 am - 5 pm): [phone] On Call: [phone] and follow prompts after 5 pm and on weekends Office Phone: [phone] Office Fax: [phone]

## 2017-02-08 DIAGNOSIS — I10 Essential (primary) hypertension: Secondary | ICD-10-CM | POA: Diagnosis not present

## 2017-02-08 DIAGNOSIS — I481 Persistent atrial fibrillation: Secondary | ICD-10-CM | POA: Diagnosis not present

## 2017-02-08 DIAGNOSIS — I5023 Acute on chronic systolic (congestive) heart failure: Secondary | ICD-10-CM | POA: Diagnosis not present

## 2017-02-08 DIAGNOSIS — Z7901 Long term (current) use of anticoagulants: Secondary | ICD-10-CM | POA: Diagnosis not present

## 2017-02-11 DIAGNOSIS — E039 Hypothyroidism, unspecified: Secondary | ICD-10-CM | POA: Diagnosis not present

## 2017-02-11 DIAGNOSIS — M899 Disorder of bone, unspecified: Secondary | ICD-10-CM | POA: Diagnosis not present

## 2017-02-11 DIAGNOSIS — R262 Difficulty in walking, not elsewhere classified: Secondary | ICD-10-CM | POA: Diagnosis not present

## 2017-02-11 DIAGNOSIS — R488 Other symbolic dysfunctions: Secondary | ICD-10-CM | POA: Diagnosis not present

## 2017-02-11 DIAGNOSIS — R296 Repeated falls: Secondary | ICD-10-CM | POA: Diagnosis not present

## 2017-02-11 DIAGNOSIS — R413 Other amnesia: Secondary | ICD-10-CM | POA: Diagnosis not present

## 2017-02-11 DIAGNOSIS — M8580 Other specified disorders of bone density and structure, unspecified site: Secondary | ICD-10-CM | POA: Diagnosis not present

## 2017-02-11 DIAGNOSIS — Z9181 History of falling: Secondary | ICD-10-CM | POA: Diagnosis not present

## 2017-02-11 DIAGNOSIS — R351 Nocturia: Secondary | ICD-10-CM | POA: Diagnosis not present

## 2017-02-11 DIAGNOSIS — F418 Other specified anxiety disorders: Secondary | ICD-10-CM | POA: Diagnosis not present

## 2017-02-11 DIAGNOSIS — R41841 Cognitive communication deficit: Secondary | ICD-10-CM | POA: Diagnosis not present

## 2017-02-11 DIAGNOSIS — K5900 Constipation, unspecified: Secondary | ICD-10-CM | POA: Diagnosis not present

## 2017-02-11 DIAGNOSIS — F0390 Unspecified dementia without behavioral disturbance: Secondary | ICD-10-CM | POA: Diagnosis not present

## 2017-02-11 DIAGNOSIS — I10 Essential (primary) hypertension: Secondary | ICD-10-CM | POA: Diagnosis not present

## 2017-02-11 DIAGNOSIS — R29898 Other symptoms and signs involving the musculoskeletal system: Secondary | ICD-10-CM | POA: Diagnosis not present

## 2017-02-11 DIAGNOSIS — L299 Pruritus, unspecified: Secondary | ICD-10-CM | POA: Diagnosis not present

## 2017-02-11 DIAGNOSIS — R2681 Unsteadiness on feet: Secondary | ICD-10-CM | POA: Diagnosis not present

## 2017-02-11 DIAGNOSIS — M6281 Muscle weakness (generalized): Secondary | ICD-10-CM | POA: Diagnosis not present

## 2017-02-14 DIAGNOSIS — I4891 Unspecified atrial fibrillation: Secondary | ICD-10-CM | POA: Diagnosis not present

## 2017-02-14 DIAGNOSIS — Z7901 Long term (current) use of anticoagulants: Secondary | ICD-10-CM | POA: Diagnosis not present

## 2017-02-14 LAB — PROTIME-INR: Protime: 19.4 — AB (ref 10.0–13.8)

## 2017-02-14 LAB — POCT INR: INR: 1.8 — AB (ref <=1.1)

## 2017-02-16 ENCOUNTER — Other Ambulatory Visit: Payer: Self-pay | Admitting: *Deleted

## 2017-02-17 ENCOUNTER — Non-Acute Institutional Stay (SKILLED_NURSING_FACILITY): Payer: PPO | Admitting: Nurse Practitioner

## 2017-02-17 ENCOUNTER — Encounter: Payer: Self-pay | Admitting: Nurse Practitioner

## 2017-02-17 DIAGNOSIS — F418 Other specified anxiety disorders: Secondary | ICD-10-CM | POA: Diagnosis not present

## 2017-02-17 DIAGNOSIS — I10 Essential (primary) hypertension: Secondary | ICD-10-CM | POA: Diagnosis not present

## 2017-02-17 DIAGNOSIS — E063 Autoimmune thyroiditis: Secondary | ICD-10-CM | POA: Diagnosis not present

## 2017-02-17 DIAGNOSIS — I480 Paroxysmal atrial fibrillation: Secondary | ICD-10-CM

## 2017-02-17 DIAGNOSIS — E038 Other specified hypothyroidism: Secondary | ICD-10-CM | POA: Diagnosis not present

## 2017-02-17 DIAGNOSIS — I481 Persistent atrial fibrillation: Secondary | ICD-10-CM | POA: Diagnosis not present

## 2017-02-17 DIAGNOSIS — F0390 Unspecified dementia without behavioral disturbance: Secondary | ICD-10-CM

## 2017-02-17 NOTE — Assessment & Plan Note (Addendum)
Afib, heart rate is in control, continue Bystolic 2.5mg  qd, Diltiazem 120 mg qd,  Coumadin for thromboembolic risk reduction.

## 2017-02-17 NOTE — Assessment & Plan Note (Signed)
Hypothyroidism, continue Levothyroxine 55mcg daily, last TSH 2.048 10/27/16

## 2017-02-17 NOTE — Assessment & Plan Note (Signed)
Controlled, continue Bystolic and Diltiazem

## 2017-02-17 NOTE — Assessment & Plan Note (Signed)
dementia, she is wheelchair dependent, one person transfer, incontinent of bowel and bladder, continue Namenda 28mg  po qd.

## 2017-02-17 NOTE — Assessment & Plan Note (Signed)
Her mood is stable, off Lexapro.  

## 2017-02-17 NOTE — Progress Notes (Signed)
Location:  [facility] Room Number: 107 Place of Service:  SNF (31) Provider: Lennie Odor Desmin Daleo NP  Blanchie Serve, MD  Patient Care Team: Blanchie Serve, MD as PCP - General (Internal Medicine) Mate Alegria X, NP as Nurse Practitioner (Nurse Practitioner) Wilford Corner, MD as Consulting Physician (Gastroenterology) Sanda Klein, MD as Consulting Physician (Cardiology)  Extended Emergency Contact Information Primary Emergency Contact: [name],Pat Address: [place] [address] [name] of [place] Phone: [phone] Mobile Phone: [phone] Relation: Daughter  Code Status:  DNR Goals of care: Advanced Directive information Advanced Directives 02/03/2017  Does Patient Have a Medical Advance Directive? Yes  Type of Paramedic of [place];Out of facility DNR (pink MOST or yellow form)  Does patient want to make changes to medical advance directive? No - Patient declined  Copy of [place] in Chart? Yes  Pre-existing out of facility DNR order (yellow form or pink MOST form) Yellow form placed in chart (order not valid for inpatient use)     Chief Complaint  Patient presents with  . Medical Management of Chronic Issues    HPI:  Pt is a 83 y.o. female seen today for medical management of chronic diseases.     The patient has history of dementia, she is wheelchair dependent, one person transfer, incontinent of bowel and bladder, on Namenda 28mg  po qd. Afib, heart rate is in control, taking Bystolic 2.5mg  qd, Diltiazem 120mg  qd, Coumadin for thromboembolic risk reduction. Her mood is stable, off Lexapro. Hypothyroidism taking Levothyroxine 58mcg daily, last TSH 2.048 10/27/16. HTN, blood pressure is in control.       Past Medical History:  Diagnosis Date  . Anemia, unspecified   . Anorexia   . Anxiety   . Arthritis   . Atherosclerotic cerebrovascular disease   . Backache,  unspecified   . Cataract   . Cholelithiasis   . Chronic kidney disease, unspecified   . Closed fracture of right inferior pubic ramus ([place]) 11/12/2013   11/10/13 s/p fall, ED eval  X-ray R hip  1. Possible nondisplaced right inferior pubic ramus fracture.  2. No femur fracture or dislocation   02/06/14 dc prn Motrin and Norco-not used    . Depression   . Diaphragmatic hernia without mention of obstruction or gangrene   . Diseases of lips 03/26/2013  . Diverticulosis of colon (without mention of hemorrhage)   . Hemorrhage of rectum and anus 03/26/2013  . Hyperlipidemia   . Hypertension   . Insomnia, unspecified   . Laceration of occipital scalp 11/12/2013  . Macular degeneration 06/28/2013  . Macular degeneration (senile) of retina, unspecified   . Osteoporosis   . Other malaise and fatigue   . Other sleep disturbances   . Other specified cardiac dysrhythmias(427.89)   . Other specified personal history presenting hazards to health(V15.89)   . Other symptoms involving cardiovascular system   . Pancreatitis   . Personal history of other diseases of circulatory system   . Personal history of other diseases of digestive system    pancreatitis from gallstones  . Personal history of other diseases of respiratory system   . Personal history of traumatic fracture   . Rosacea   . Senile osteoporosis    with old T11 fracture  . Thyroid disease   . Unspecified disorders of arteries and arterioles   . Unspecified hearing loss   . Unspecified hemorrhoids without mention of  complication    Past Surgical History:  Procedure Laterality Date  . ABDOMINAL HYSTERECTOMY     and BSO fibroids  . COLONOSCOPY  05/23/2007  . EYE LID LIFT Right 09/2016   OD  . HIP ARTHROPLASTY Left 10/31/2016   Procedure: ARTHROPLASTY  HIP (HEMIARTHROPLASTY);  Surgeon: Paralee Cancel, MD;  Location: [facility];  Service: Orthopedics;  Laterality: Left;  . JOINT REPLACEMENT  1996  . TOTAL HIP ARTHROPLASTY Right     Allergies    Allergen Reactions  . Ace Inhibitors Cough  . Mirtazapine Other (See Comments)    Nightmares   . Penicillins Rash    Has patient had a PCN reaction causing immediate rash, facial/tongue/throat swelling, SOB or lightheadedness with hypotension: Yes Has patient had a PCN reaction causing severe rash involving mucus membranes or skin necrosis: Unknown Has patient had a PCN reaction that required hospitalization: Unknown Has patient had a PCN reaction occurring within the last 10 years: Unknown If all of the above answers are "NO", then may proceed with Cephalosporin use.     Outpatient Encounter Medications as of 02/17/2017  Medication Sig  . acetaminophen (TYLENOL) 500 MG tablet Take 1,000 mg by mouth every 8 (eight) hours as needed for mild pain.  Marland Kitchen alum & mag hydroxide-simeth (MAALOX/MYLANTA) 200-200-20 MG/5ML suspension Take 30 mLs by mouth every 4 (four) hours as needed for indigestion.  Marland Kitchen BYSTOLIC 10 MG tablet Take 10 mg by mouth daily.   . cholecalciferol 2000 units TABS Take 1 tablet (2,000 Units total) by mouth daily.  . clindamycin (CLEOCIN) 150 MG capsule Take 600 mg by mouth See admin instructions. ONE HOUR PRIOR TO DENTAL APPOINTMENTS  . Dextromethorphan-Guaifenesin (ROBAFEN DM) 10-100 MG/5ML liquid Take 10 mLs by mouth every 6 (six) hours as needed.  . diltiazem (CARDIZEM CD) 120 MG 24 hr capsule Take 120 mg by mouth daily.  Marland Kitchen escitalopram (LEXAPRO) 5 MG tablet Take 5 mg by mouth every other day.   . hydrocortisone cream 1 % Apply 1 application topically 2 (two) times daily. TO HEMORRHOIDS  . levothyroxine (SYNTHROID, LEVOTHROID) 25 MCG tablet Take 25 mcg by mouth daily before breakfast.   . Lidocaine (ASPERCREME LIDOCAINE) 4 % PTCH Apply 1 patch topically at bedtime. APPLY TO LOWER BACK AND REMOVE IN THE MORNING  . loratadine (CLARITIN) 10 MG tablet Take 10 mg by mouth daily as needed for allergies.  . memantine (NAMENDA XR) 28 MG CP24 24 hr capsule Take 28 mg by mouth.  .  Nutritional Supplements (BENECALORIE PO) Take 1.5 oz by mouth 2 (two) times daily.   . ondansetron (ZOFRAN) 4 MG tablet Take 1 tablet (4 mg total) by mouth every 6 (six) hours as needed for nausea.  . polyethylene glycol (MIRALAX / GLYCOLAX) packet Take 17 g by mouth every other day. For constipation and hold for loose stools  . polyvinyl alcohol (LIQUIFILM TEARS) 1.4 % ophthalmic solution Place 1 drop into both eyes 4 (four) times daily.   Marland Kitchen warfarin (COUMADIN) 2 MG tablet Take 2 mg by mouth daily.   No facility-administered encounter medications on file as of 02/17/2017.    ROS was provided with assistance of staff Review of Systems  Constitutional: Negative for activity change, chills, diaphoresis, fatigue and fever.  HENT: Positive for hearing loss. Negative for congestion and trouble swallowing.   Eyes: Negative for visual disturbance.  Respiratory: Negative for cough, choking, shortness of breath and wheezing.   Cardiovascular: Negative for chest pain, palpitations and leg swelling.  Gastrointestinal: Negative for abdominal distention, abdominal pain, constipation, diarrhea, nausea and vomiting.       Incontinent of bowel.   Endocrine: Negative for cold intolerance.  Genitourinary: Negative for difficulty urinating, dysuria and urgency.       Incontinent of bladder  Musculoskeletal: Positive for gait problem.       Wheelchair dependent.   Neurological: Negative for tremors, speech difficulty, weakness and headaches.       Dementia  Psychiatric/Behavioral: Positive for confusion. Negative for agitation, behavioral problems and hallucinations. The patient is not nervous/anxious.     Immunization History  Administered Date(s) Administered  . DTaP 10/12/2012  . Influenza-Unspecified 11/22/2012, 12/26/2013, 11/12/2014, 12/09/2015, 12/14/2016  . PPD Test 12/06/2013  . Pneumococcal Polysaccharide-23 09/22/2000  . Td 07/08/2005  . Tdap 11/10/2013   Pertinent  Health Maintenance Due    Topic Date Due  . PNA vac Low Risk Adult (2 of 2 - PCV13) 02/22/2017 (Originally 09/22/2001)  . INFLUENZA VACCINE  Completed  . DEXA SCAN  Completed   Fall Risk  10/22/2016 01/13/2015 06/28/2013  Falls in the past year? No No No  Risk for fall due to : - Impaired balance/gait -   Functional Status Survey:    There were no vitals filed for this visit. There is no height or weight on file to calculate BMI. Physical Exam  Constitutional: She appears well-nourished.  HENT:  Head: Normocephalic and atraumatic.  Eyes: Conjunctivae and EOM are normal. Pupils are equal, round, and reactive to light.  Neck: Normal range of motion. Neck supple. No JVD present. No thyromegaly present.  Cardiovascular: Normal rate, regular rhythm and normal heart sounds.  No murmur heard. Pulmonary/Chest: Effort normal and breath sounds normal. She has no wheezes. She has no rales.  Abdominal: Soft. Bowel sounds are normal. She exhibits no distension. There is no tenderness.  Musculoskeletal: Normal range of motion. She exhibits no edema or tenderness.  One person assist with transfer, w/c for mobility.   Neurological: She is alert. Coordination normal.  Oriented to self.   Skin: Skin is warm and dry. No rash noted. No erythema. No pallor.  Psychiatric: She has a normal mood and affect. Her behavior is normal.    Labs reviewed: Recent Labs    10/31/16 0236 11/01/16 0353 11/02/16 0445 11/11/16 01/04/17  NA 140 140 139 142 141  K 3.7 3.8 3.9 3.6 3.8  CL 112* 113* 112*  --   --   CO2 21* 21* 20*  --   --   GLUCOSE 108* 149* 110*  --   --   BUN 27* 32* 24* 19 28*  CREATININE 0.90 0.90 0.80 0.8 0.8  CALCIUM 8.2* 8.3* 8.3*  --   --    Recent Labs    06/29/16 08/31/16 10/28/16 0329 01/04/17  AST 29 19  --  17  ALT 14 12  --  10  ALKPHOS 50 70  --  75  ALBUMIN  --   --  3.0*  --    Recent Labs    10/27/16 1715  11/01/16 0353 11/02/16 0445 11/03/16 0318 11/11/16 01/04/17  WBC 9.6   < > 6.0 7.4  8.4 6.4 5.5  NEUTROABS 8.4*  --   --   --   --   --   --   HGB 12.4   < > 10.0* 9.5* 10.3* 9.8* 11.3*  HCT 39.1   < > 31.2* 29.8* 32.6* 29* 33*  MCV 94.7   < > 94.8  96.1 95.0  --   --   PLT 174   < > 179 163 197 419* 254   < > = values in this interval not displayed.   Lab Results  Component Value Date   TSH 2.048 10/27/2016   No results found for: HGBA1C No results found for: CHOL, HDL, LDLCALC, LDLDIRECT, TRIG, CHOLHDL  Significant Diagnostic Results in last 30 days:  No results found.  Assessment/Plan Senile dementia dementia, she is wheelchair dependent, one person transfer, incontinent of bowel and bladder, continue Namenda 28mg  po qd.   A-fib (HCC) Afib, heart rate is in control, continue Bystolic 2.5mg  qd, Diltiazem 120 mg qd,  Coumadin for thromboembolic risk reduction.   Depression with anxiety Her mood is stable, off Lexapro.   Hypothyroidism Hypothyroidism, continue Levothyroxine 51mcg daily, last TSH 2.048 10/27/16    HTN (hypertension) Controlled, continue Bystolic and Diltiazem     Family/ staff Communication: plan of care reviewed with the patient and charge nurse.   Labs/tests ordered:  none  Time spend 25 minutes.

## 2017-02-21 DIAGNOSIS — I481 Persistent atrial fibrillation: Secondary | ICD-10-CM | POA: Diagnosis not present

## 2017-02-28 DIAGNOSIS — I481 Persistent atrial fibrillation: Secondary | ICD-10-CM | POA: Diagnosis not present

## 2017-03-08 DIAGNOSIS — I5040 Unspecified combined systolic (congestive) and diastolic (congestive) heart failure: Secondary | ICD-10-CM | POA: Diagnosis not present

## 2017-03-15 DIAGNOSIS — I5040 Unspecified combined systolic (congestive) and diastolic (congestive) heart failure: Secondary | ICD-10-CM | POA: Diagnosis not present

## 2017-03-15 LAB — POCT INR: INR: 3.3 — AB (ref 0.9–1.1)

## 2017-03-17 DIAGNOSIS — I5040 Unspecified combined systolic (congestive) and diastolic (congestive) heart failure: Secondary | ICD-10-CM | POA: Diagnosis not present

## 2017-03-17 LAB — POCT INR: INR: 1.9 — AB (ref 0.9–1.1)

## 2017-03-22 ENCOUNTER — Encounter: Payer: Self-pay | Admitting: Internal Medicine

## 2017-03-22 ENCOUNTER — Non-Acute Institutional Stay (SKILLED_NURSING_FACILITY): Payer: PPO | Admitting: Internal Medicine

## 2017-03-22 DIAGNOSIS — I480 Paroxysmal atrial fibrillation: Secondary | ICD-10-CM

## 2017-03-22 DIAGNOSIS — J309 Allergic rhinitis, unspecified: Secondary | ICD-10-CM | POA: Diagnosis not present

## 2017-03-22 DIAGNOSIS — D638 Anemia in other chronic diseases classified elsewhere: Secondary | ICD-10-CM | POA: Diagnosis not present

## 2017-03-22 DIAGNOSIS — E063 Autoimmune thyroiditis: Secondary | ICD-10-CM

## 2017-03-22 DIAGNOSIS — E038 Other specified hypothyroidism: Secondary | ICD-10-CM

## 2017-03-22 NOTE — Progress Notes (Signed)
Location:  [facility] Room Number: 59 Place of Service:  SNF [phone]) Provider:  Blanchie Serve MD  Blanchie Serve, MD  Patient Care Team: Blanchie Serve, MD as PCP - General (Internal Medicine) Mast, Man X, NP as Nurse Practitioner (Nurse Practitioner) Wilford Corner, MD as Consulting Physician (Gastroenterology) Sanda Klein, MD as Consulting Physician (Cardiology)  Extended Emergency Contact Information Primary Emergency Contact: [name],Pat Address: [place] [address] [name] of [place] Phone: [phone] Mobile Phone: [phone] Relation: Daughter  Code Status:  DNR  Goals of care: Advanced Directive information Advanced Directives 03/22/2017  Does Patient Have a Medical Advance Directive? Yes  Type of Paramedic of [place];Out of facility DNR (pink MOST or yellow form)  Does patient want to make changes to medical advance directive? No - Patient declined  Copy of [place] in Chart? Yes  Pre-existing out of facility DNR order (yellow form or pink MOST form) Yellow form placed in chart (order not valid for inpatient use);Pink MOST form placed in chart (order not valid for inpatient use)     Chief Complaint  Patient presents with  . Medical Management of Chronic Issues    Routine Visit     HPI:  Pt is a 84 y.o. [place] seen today for medical management of chronic diseases.  She has advanced dementia and is under total care. She tells me that once she goes home she wont come back. She will leave Watt Climes there (her doll). She denies any pain this visit. No new concern from nursing.    Past Medical History:  Diagnosis Date  . Anemia, unspecified   . Anorexia   . Anxiety   . Arthritis   . Atherosclerotic cerebrovascular disease   . Backache, unspecified   . Cataract   . Cholelithiasis   . Chronic kidney disease, unspecified   . Closed fracture of right inferior  pubic ramus ([place]) 11/12/2013   11/10/13 s/p fall, ED eval  X-ray R hip  1. Possible nondisplaced right inferior pubic ramus fracture.  2. No femur fracture or dislocation   02/06/14 dc prn Motrin and Norco-not used    . Depression   . Diaphragmatic hernia without mention of obstruction or gangrene   . Diseases of lips 03/26/2013  . Diverticulosis of colon (without mention of hemorrhage)   . Hemorrhage of rectum and anus 03/26/2013  . Hyperlipidemia   . Hypertension   . Insomnia, unspecified   . Laceration of occipital scalp 11/12/2013  . Macular degeneration 06/28/2013  . Macular degeneration (senile) of retina, unspecified   . Osteoporosis   . Other malaise and fatigue   . Other sleep disturbances   . Other specified cardiac dysrhythmias(427.Patricia)   . Other specified personal history presenting hazards to health(V15.Patricia)   . Other symptoms involving cardiovascular system   . Pancreatitis   . Personal history of other diseases of circulatory system   . Personal history of other diseases of digestive system    pancreatitis from gallstones  . Personal history of other diseases of respiratory system   . Personal history of traumatic fracture   . Rosacea   . Senile osteoporosis    with old T11 fracture  . Thyroid disease   . Unspecified disorders of arteries and arterioles   . Unspecified hearing loss   . Unspecified hemorrhoids without mention of complication    Past Surgical History:  Procedure Laterality  Date  . ABDOMINAL HYSTERECTOMY     and BSO fibroids  . COLONOSCOPY  05/23/2007  . EYE LID LIFT Right 09/2016   OD  . HIP ARTHROPLASTY Left 10/31/2016   Procedure: ARTHROPLASTY  HIP (HEMIARTHROPLASTY);  Surgeon: Paralee Cancel, MD;  Location: [facility];  Service: Orthopedics;  Laterality: Left;  . JOINT REPLACEMENT  1996  . TOTAL HIP ARTHROPLASTY Right     Allergies  Allergen Reactions  . Ace Inhibitors Cough  . Mirtazapine Other (See Comments)    Nightmares   . Penicillins Rash    Has  patient had a PCN reaction causing immediate rash, facial/tongue/throat swelling, SOB or lightheadedness with hypotension: Yes Has patient had a PCN reaction causing severe rash involving mucus membranes or skin necrosis: Unknown Has patient had a PCN reaction that required hospitalization: Unknown Has patient had a PCN reaction occurring within the last 10 years: Unknown If all of the above answers are "NO", then may proceed with Cephalosporin use.     Outpatient Encounter Medications as of 03/22/2017  Medication Sig  . acetaminophen (TYLENOL) 500 MG tablet Take 1,000 mg by mouth every 8 (eight) hours as needed for mild pain.  Marland Kitchen alum & mag hydroxide-simeth (MAALOX/MYLANTA) 200-200-20 MG/5ML suspension Take 30 mLs by mouth every 4 (four) hours as needed for indigestion.  . cholecalciferol 2000 units TABS Take 1 tablet (2,000 Units total) by mouth daily.  . clindamycin (CLEOCIN) 150 MG capsule Take 600 mg by mouth See admin instructions. ONE HOUR PRIOR TO DENTAL APPOINTMENTS  . Dextromethorphan-Guaifenesin (ROBAFEN DM) 10-100 MG/5ML liquid Take 10 mLs by mouth every 6 (six) hours as needed.  . diltiazem (CARDIZEM CD) 120 MG 24 hr capsule Take 120 mg by mouth daily.  . hydrocortisone cream 1 % Apply 1 application topically 2 (two) times daily. TO HEMORRHOIDS  . levothyroxine (SYNTHROID, LEVOTHROID) 25 MCG tablet Take 25 mcg by mouth daily before breakfast.   . Lidocaine (ASPERCREME LIDOCAINE) 4 % PTCH Apply 1 patch topically at bedtime. APPLY TO LOWER BACK AND REMOVE IN THE MORNING  . loratadine (CLARITIN) 10 MG tablet Take 10 mg by mouth daily as needed for allergies.  . memantine (NAMENDA XR) 28 MG CP24 24 hr capsule Take 28 mg by mouth.  . nebivolol (BYSTOLIC) 2.5 MG tablet Take 2.5 mg by mouth daily.  . Nutritional Supplements (BENECALORIE PO) Take 1.5 oz by mouth 2 (two) times daily.   . ondansetron (ZOFRAN) 4 MG tablet Take 1 tablet (4 mg total) by mouth every 6 (six) hours as needed for  nausea.  . polyethylene glycol (MIRALAX / GLYCOLAX) packet Take 17 g by mouth every other day. For constipation and hold for loose stools  . polyvinyl alcohol (LIQUIFILM TEARS) 1.4 % ophthalmic solution Place 1 drop into both eyes 4 (four) times daily.   Marland Kitchen warfarin (COUMADIN) 3 MG tablet Take 3 mg by mouth. Monday, Wednesday, Friday, Saturday and Sunday  . [DISCONTINUED] BYSTOLIC 10 MG tablet Take 10 mg by mouth daily.   . [DISCONTINUED] escitalopram (LEXAPRO) 5 MG tablet Take 5 mg by mouth every other day.   . [DISCONTINUED] warfarin (COUMADIN) 2 MG tablet Take 2 mg by mouth daily.  . [DISCONTINUED] warfarin (COUMADIN) 2.5 MG tablet Take 2.5 mg by mouth one time only at 6 PM.   No facility-administered encounter medications on file as of 03/22/2017.     Review of Systems  Constitutional: Negative for chills and fever.  HENT: Negative for congestion, mouth sores and trouble swallowing.  Respiratory: Negative for cough and shortness of breath.   Cardiovascular: Negative for chest pain.  Gastrointestinal: Negative for abdominal pain, constipation and diarrhea.  Genitourinary: Negative for dysuria.       Has UI  Musculoskeletal: Positive for gait problem.  Neurological: Negative for dizziness and headaches.  Psychiatric/Behavioral: Positive for behavioral problems and confusion.    Immunization History  Administered Date(s) Administered  . DTaP 10/12/2012  . Influenza-Unspecified 11/22/2012, 12/26/2013, 11/12/2014, 12/09/2015, 12/14/2016  . PPD Test 12/06/2013  . Pneumococcal Conjugate-13 11/05/2016  . Pneumococcal Polysaccharide-23 09/22/2000  . Td 07/08/2005  . Tdap 11/10/2013   Pertinent  Health Maintenance Due  Topic Date Due  . INFLUENZA VACCINE  Completed  . DEXA SCAN  Completed  . PNA vac Low Risk Adult  Completed   Fall Risk  10/22/2016 01/13/2015 06/28/2013  Falls in the past year? No No No  Risk for fall due to : - Impaired balance/gait -   Functional Status Survey:     Vitals:   03/22/17 1514  BP: 130/70  Pulse: 72  Resp: 16  Temp: (!) 97.4 F (36.3 C)  TempSrc: Oral  SpO2: 97%  Weight: 109 lb 8 oz (49.7 kg)  Height: 5' (1.524 m)   Body mass index is 21.39 kg/m.   Wt Readings from Last 3 Encounters:  03/22/17 109 lb 8 oz (49.7 kg)  02/17/17 112 lb 1.6 oz (50.8 kg)  02/03/17 114 lb 4.8 oz (51.8 kg)   Physical Exam  Constitutional:  Thin built, frail, in no acute distress  HENT:  Head: Normocephalic and atraumatic.  Mouth/Throat: Oropharynx is clear and moist.  Eyes: Conjunctivae are normal. Pupils are equal, round, and reactive to light. Right eye exhibits no discharge. Left eye exhibits no discharge.  Neck: Neck supple.  Cardiovascular:  Irregular HR  Pulmonary/Chest: Effort normal and breath sounds normal. She has no wheezes. She has no rales.  Abdominal: Soft. Bowel sounds are normal. There is no tenderness.  Musculoskeletal: She exhibits deformity. She exhibits no edema.  Lymphadenopathy:    She has no cervical adenopathy.  Neurological: She is alert.  Oriented only to self  Skin: Skin is warm and dry. She is not diaphoretic.    Labs reviewed: Recent Labs    10/31/16 0236 11/01/16 0353 11/02/16 0445 11/11/16 01/04/17  NA 140 140 139 142 141  K 3.7 3.8 3.9 3.6 3.8  CL 112* 113* 112*  --   --   CO2 21* 21* 20*  --   --   GLUCOSE 108* 149* 110*  --   --   BUN 27* 32* 24* 19 28*  CREATININE 0.90 0.90 0.80 0.8 0.8  CALCIUM 8.2* 8.3* 8.3*  --   --    Recent Labs    06/29/16 08/31/16 10/28/16 0329 01/04/17  AST 29 19  --  17  ALT 14 12  --  10  ALKPHOS 50 70  --  75  ALBUMIN  --   --  3.0*  --    Recent Labs    10/27/16 1715  11/01/16 0353 11/02/16 0445 11/03/16 0318 11/11/16 01/04/17  WBC 9.6   < > 6.0 7.4 8.4 6.4 5.5  NEUTROABS 8.4*  --   --   --   --   --   --   HGB 12.4   < > 10.0* 9.5* 10.3* 9.8* 11.3*  HCT 39.1   < > 31.2* 29.8* 32.6* 29* 33*  MCV 94.7   < > 94.8 96.1  95.0  --   --   PLT 174   < > 179  163 197 419* 254   < > = values in this interval not displayed.   Lab Results  Component Value Date   TSH 2.048 10/27/2016   No results found for: HGBA1C No results found for: CHOL, HDL, LDLCALC, LDLDIRECT, TRIG, CHOLHDL  Significant Diagnostic Results in last 30 days:  No results found.  Assessment/Plan  Acquired hypothyroidism Lab Results  Component Value Date   TSH 2.048 10/27/2016   Check tsh, continue levothyroxine 25 mcg daily  Allergic rhinitis Change claritin to 10 mg daily for now and monitor  Anemia of chronic disease Stable h&h, monitor cbc  afib Controlled HR. Continue diltiazem 120 mg daily with nebivolol. Continue warfarin for anticoagulation   Family/ staff Communication: reviewed care plan with patient and charge nurse.    Labs/tests ordered:  TSH   Blanchie Serve, MD Internal Medicine [facility] [address] [place], [place] [address] Cell Phone (Monday-Friday 8 am - 5 pm): [phone] On Call: [phone] and follow prompts after 5 pm and on weekends Office Phone: [phone] Office Fax: [phone]

## 2017-03-24 DIAGNOSIS — R946 Abnormal results of thyroid function studies: Secondary | ICD-10-CM | POA: Diagnosis not present

## 2017-03-24 DIAGNOSIS — I5040 Unspecified combined systolic (congestive) and diastolic (congestive) heart failure: Secondary | ICD-10-CM | POA: Diagnosis not present

## 2017-03-31 ENCOUNTER — Other Ambulatory Visit: Payer: Self-pay | Admitting: *Deleted

## 2017-03-31 DIAGNOSIS — I5040 Unspecified combined systolic (congestive) and diastolic (congestive) heart failure: Secondary | ICD-10-CM | POA: Diagnosis not present

## 2017-03-31 LAB — POCT INR: INR: 2.6 — AB (ref <=1.1)

## 2017-03-31 LAB — PROTIME-INR: PROTIME: 27.3 — AB (ref 10.0–13.8)

## 2017-04-14 DIAGNOSIS — I5040 Unspecified combined systolic (congestive) and diastolic (congestive) heart failure: Secondary | ICD-10-CM | POA: Diagnosis not present

## 2017-04-14 DIAGNOSIS — Z7901 Long term (current) use of anticoagulants: Secondary | ICD-10-CM | POA: Diagnosis not present

## 2017-04-20 ENCOUNTER — Encounter: Payer: Self-pay | Admitting: Nurse Practitioner

## 2017-04-20 ENCOUNTER — Non-Acute Institutional Stay (SKILLED_NURSING_FACILITY): Payer: PPO | Admitting: Nurse Practitioner

## 2017-04-20 DIAGNOSIS — F0390 Unspecified dementia without behavioral disturbance: Secondary | ICD-10-CM

## 2017-04-20 DIAGNOSIS — I480 Paroxysmal atrial fibrillation: Secondary | ICD-10-CM

## 2017-04-20 DIAGNOSIS — E063 Autoimmune thyroiditis: Secondary | ICD-10-CM

## 2017-04-20 DIAGNOSIS — I10 Essential (primary) hypertension: Secondary | ICD-10-CM

## 2017-04-20 DIAGNOSIS — H00012 Hordeolum externum right lower eyelid: Secondary | ICD-10-CM

## 2017-04-20 DIAGNOSIS — E038 Other specified hypothyroidism: Secondary | ICD-10-CM

## 2017-04-20 NOTE — Assessment & Plan Note (Addendum)
Continue Levothyroxine 7mcg po daily, last TSH 2.35 03/24/17

## 2017-04-20 NOTE — Assessment & Plan Note (Signed)
Hx of Afib/HTN, heart rate and blood pressuresare  in control, on Nebivolol 2.5mg  daily, Diltiazem 120mg  daily.

## 2017-04-20 NOTE — Assessment & Plan Note (Signed)
Hx of Afib/HTN, heart rate and blood pressuresare  in control, on Nebivolol 2.5mg  daily, Diltiazem 120mg  daily. Continue Coumadin for thromboembolic risk reduction.

## 2017-04-20 NOTE — Assessment & Plan Note (Signed)
The right lateral upper lid small stye. Margin of the right lower eyelid mild erythema. Warm compress 54min/each, 4x/day x 5 days, observe.

## 2017-04-20 NOTE — Progress Notes (Signed)
Location:  [facility] Room Number: 22 Place of Service:  SNF (31) Provider:  Tzion Wedel, Manxie  NP  Blanchie Serve, MD  Patient Care Team: Blanchie Serve, MD as PCP - General (Internal Medicine) Gumecindo Hopkin X, NP as Nurse Practitioner (Nurse Practitioner) Wilford Corner, MD as Consulting Physician (Gastroenterology) Sanda Klein, MD as Consulting Physician (Cardiology)  Extended Emergency Contact Information Primary Emergency Contact: [name],Pat Address: [place] [address] [name] of [place] Phone: [phone] Mobile Phone: [phone] Relation: Daughter  Code Status:  DNR Goals of care: Advanced Directive information Advanced Directives 03/22/2017  Does Patient Have a Medical Advance Directive? Yes  Type of Paramedic of [place];Out of facility DNR (pink MOST or yellow form)  Does patient want to make changes to medical advance directive? No - Patient declined  Copy of [place] in Chart? Yes  Pre-existing out of facility DNR order (yellow form or pink MOST form) Yellow form placed in chart (order not valid for inpatient use);Pink MOST form placed in chart (order not valid for inpatient use)     Chief Complaint  Patient presents with  . Medical Management of Chronic Issues    F/u- afib, hypothyroidism    HPI:  Pt is a 84 y.o. female seen today for medical management of chronic diseases.     The patient has history of dementia, resides in SNF, one person transfer, ambulates with walker with SBA, wheelchair to get around otherwise. She takes Memantine 28mg  daily for memory. Hx of Afib/HTN, heart rate and blood pressuresare  in control, on Nebivolol 2.5mg  daily, Diltiazem 120mg  daily. She takes Levothyroxine 25mcg daily, last TSH 2.35 03/24/17   Past Medical History:  Diagnosis Date  . Anemia, unspecified   . Anorexia   . Anxiety   . Arthritis   . Atherosclerotic  cerebrovascular disease   . Backache, unspecified   . Cataract   . Cholelithiasis   . Chronic kidney disease, unspecified   . Closed fracture of right inferior pubic ramus ([place]) 11/12/2013   11/10/13 s/p fall, ED eval  X-ray R hip  1. Possible nondisplaced right inferior pubic ramus fracture.  2. No femur fracture or dislocation   02/06/14 dc prn Motrin and Norco-not used    . Depression   . Diaphragmatic hernia without mention of obstruction or gangrene   . Diseases of lips 03/26/2013  . Diverticulosis of colon (without mention of hemorrhage)   . Hemorrhage of rectum and anus 03/26/2013  . Hyperlipidemia   . Hypertension   . Insomnia, unspecified   . Laceration of occipital scalp 11/12/2013  . Macular degeneration 06/28/2013  . Macular degeneration (senile) of retina, unspecified   . Osteoporosis   . Other malaise and fatigue   . Other sleep disturbances   . Other specified cardiac dysrhythmias(427.89)   . Other specified personal history presenting hazards to health(V15.89)   . Other symptoms involving cardiovascular system   . Pancreatitis   . Personal history of other diseases of circulatory system   . Personal history of other diseases of digestive system    pancreatitis from gallstones  . Personal history of other diseases of respiratory system   . Personal history of traumatic fracture   . Rosacea   . Senile osteoporosis    with old T11 fracture  . Thyroid disease   . Unspecified disorders of arteries and arterioles   . Unspecified hearing  loss   . Unspecified hemorrhoids without mention of complication    Past Surgical History:  Procedure Laterality Date  . ABDOMINAL HYSTERECTOMY     and BSO fibroids  . COLONOSCOPY  05/23/2007  . EYE LID LIFT Right 09/2016   OD  . HIP ARTHROPLASTY Left 10/31/2016   Procedure: ARTHROPLASTY  HIP (HEMIARTHROPLASTY);  Surgeon: Paralee Cancel, MD;  Location: [facility];  Service: Orthopedics;  Laterality: Left;  . JOINT REPLACEMENT  1996  . TOTAL HIP  ARTHROPLASTY Right     Allergies  Allergen Reactions  . Ace Inhibitors Cough  . Mirtazapine Other (See Comments)    Nightmares   . Penicillins Rash    Has patient had a PCN reaction causing immediate rash, facial/tongue/throat swelling, SOB or lightheadedness with hypotension: Yes Has patient had a PCN reaction causing severe rash involving mucus membranes or skin necrosis: Unknown Has patient had a PCN reaction that required hospitalization: Unknown Has patient had a PCN reaction occurring within the last 10 years: Unknown If all of the above answers are "NO", then may proceed with Cephalosporin use.     Outpatient Encounter Medications as of 04/20/2017  Medication Sig  . acetaminophen (TYLENOL) 500 MG tablet Take 1,000 mg by mouth every 8 (eight) hours as needed for mild pain.  Marland Kitchen alum & mag hydroxide-simeth (MAALOX/MYLANTA) 200-200-20 MG/5ML suspension Take 30 mLs by mouth every 4 (four) hours as needed for indigestion.  . cholecalciferol 2000 units TABS Take 1 tablet (2,000 Units total) by mouth daily.  . clindamycin (CLEOCIN) 150 MG capsule Take 600 mg by mouth See admin instructions. ONE HOUR PRIOR TO DENTAL APPOINTMENTS  . Dextromethorphan-Guaifenesin (ROBAFEN DM) 10-100 MG/5ML liquid Take 10 mLs by mouth every 6 (six) hours as needed.  . diltiazem (CARDIZEM CD) 120 MG 24 hr capsule Take 120 mg by mouth daily.  . hydrocortisone cream 1 % Apply 1 application topically 2 (two) times daily. TO HEMORRHOIDS  . levothyroxine (SYNTHROID, LEVOTHROID) 25 MCG tablet Take 25 mcg by mouth daily before breakfast.   . Lidocaine (ASPERCREME LIDOCAINE) 4 % PTCH Apply 1 patch topically at bedtime. APPLY TO LOWER BACK AND REMOVE IN THE MORNING  . loratadine (CLARITIN) 10 MG tablet Take 10 mg by mouth daily as needed for allergies.  . memantine (NAMENDA XR) 28 MG CP24 24 hr capsule Take 28 mg by mouth.  . nebivolol (BYSTOLIC) 2.5 MG tablet Take 2.5 mg by mouth daily.  . Nutritional Supplements  (BENECALORIE PO) Take 1.5 oz by mouth 2 (two) times daily.   . ondansetron (ZOFRAN) 4 MG tablet Take 1 tablet (4 mg total) by mouth every 6 (six) hours as needed for nausea.  . polyethylene glycol (MIRALAX / GLYCOLAX) packet Take 17 g by mouth every other day. For constipation and hold for loose stools  . polyvinyl alcohol (LIQUIFILM TEARS) 1.4 % ophthalmic solution Place 1 drop into both eyes 4 (four) times daily.   Marland Kitchen warfarin (COUMADIN) 3 MG tablet Take 3 mg by mouth. Monday, Wednesday, Friday, Saturday and Sunday   No facility-administered encounter medications on file as of 04/20/2017.    ROS was provided with assistance of patient's daughter and staff Review of Systems  Constitutional: Negative for activity change, appetite change, chills, diaphoresis, fatigue and fever.  HENT: Positive for hearing loss. Negative for congestion, trouble swallowing and voice change.   Eyes: Negative for visual disturbance.  Respiratory: Negative for cough, choking and shortness of breath.   Cardiovascular: Negative for chest pain, palpitations and  leg swelling.  Gastrointestinal: Negative for abdominal distention, abdominal pain, constipation, diarrhea, nausea and vomiting.  Endocrine: Negative for cold intolerance.  Genitourinary: Negative for dysuria and urgency.       Dysuria  Musculoskeletal: Positive for gait problem. Negative for joint swelling.  Skin: Negative for color change and pallor.  Neurological: Negative for tremors, speech difficulty, weakness and headaches.       Dementia  Psychiatric/Behavioral: Positive for confusion. Negative for agitation, behavioral problems, hallucinations and sleep disturbance. The patient is not nervous/anxious.     Immunization History  Administered Date(s) Administered  . DTaP 10/12/2012  . Influenza-Unspecified 11/22/2012, 12/26/2013, 11/12/2014, 12/09/2015, 12/14/2016  . PPD Test 12/06/2013  . Pneumococcal Conjugate-13 11/05/2016  . Pneumococcal  Polysaccharide-23 09/22/2000  . Td 07/08/2005  . Tdap 11/10/2013   Pertinent  Health Maintenance Due  Topic Date Due  . INFLUENZA VACCINE  Completed  . DEXA SCAN  Completed  . PNA vac Low Risk Adult  Completed   Fall Risk  10/22/2016 01/13/2015 06/28/2013  Falls in the past year? No No No  Risk for fall due to : - Impaired balance/gait -   Functional Status Survey:    Vitals:   04/20/17 1312  BP: 130/70  Pulse: 72  Resp: 18  Temp: 98.7 F (37.1 C)  Weight: 111 lb 9.6 oz (50.6 kg)  Height: 5' (1.524 m)   Body mass index is 21.8 kg/m. Physical Exam  Constitutional: She appears well-developed and well-nourished.  HENT:  Head: Normocephalic and atraumatic.  Eyes: Conjunctivae and EOM are normal. Pupils are equal, round, and reactive to light.  The right lateral upper lid small stye. Margin of the right lower eyelid mild erythema.   Neck: Normal range of motion. Neck supple. No JVD present. No thyromegaly present.  Cardiovascular: Normal rate and regular rhythm.  Murmur heard. Pulmonary/Chest: Effort normal and breath sounds normal. She has no wheezes. She has no rales.  Abdominal: Soft. Bowel sounds are normal. She exhibits no distension. There is no tenderness.  Musculoskeletal: Normal range of motion. She exhibits no edema or tenderness.  Ambulates with walker with SBA, one person transfer and toileting.   Neurological: She is alert. She exhibits normal muscle tone. Coordination normal.  Skin: Skin is warm and dry. No erythema.  Psychiatric: She has a normal mood and affect. Her behavior is normal.    Labs reviewed: Recent Labs    10/31/16 0236 11/01/16 0353 11/02/16 0445 11/11/16 01/04/17  NA 140 140 139 142 141  K 3.7 3.8 3.9 3.6 3.8  CL 112* 113* 112*  --   --   CO2 21* 21* 20*  --   --   GLUCOSE 108* 149* 110*  --   --   BUN 27* 32* 24* 19 28*  CREATININE 0.90 0.90 0.80 0.8 0.8  CALCIUM 8.2* 8.3* 8.3*  --   --    Recent Labs    06/29/16 08/31/16  10/28/16 0329 01/04/17  AST 29 19  --  17  ALT 14 12  --  10  ALKPHOS 50 70  --  75  ALBUMIN  --   --  3.0*  --    Recent Labs    10/27/16 1715  11/01/16 0353 11/02/16 0445 11/03/16 0318 11/11/16 01/04/17  WBC 9.6   < > 6.0 7.4 8.4 6.4 5.5  NEUTROABS 8.4*  --   --   --   --   --   --   HGB 12.4   < >  10.0* 9.5* 10.3* 9.8* 11.3*  HCT 39.1   < > 31.2* 29.8* 32.6* 29* 33*  MCV 94.7   < > 94.8 96.1 95.0  --   --   PLT 174   < > 179 163 197 419* 254   < > = values in this interval not displayed.   Lab Results  Component Value Date   TSH 2.048 10/27/2016   No results found for: HGBA1C No results found for: CHOL, HDL, LDLCALC, LDLDIRECT, TRIG, CHOLHDL  Significant Diagnostic Results in last 30 days:  No results found.  Assessment/Plan Hypothyroidism Continue Levothyroxine 68mcg po daily, last TSH 2.35 03/24/17  Senile dementia dementia, resides in SNF, one person transfer, ambulates with walker with SBA, wheelchair to get around otherwise. She takes Memantine 28mg  daily for memory. Will encourage the patient walk with walker to and from dinning room with SBA  HTN (hypertension) Hx of Afib/HTN, heart rate and blood pressuresare  in control, on Nebivolol 2.5mg  daily, Diltiazem 120mg  daily.  A-fib (HCC) Hx of Afib/HTN, heart rate and blood pressuresare  in control, on Nebivolol 2.5mg  daily, Diltiazem 120mg  daily. Continue Coumadin for thromboembolic risk reduction.   Stye The right lateral upper lid small stye. Margin of the right lower eyelid mild erythema. Warm compress 55min/each, 4x/day x 5 days, observe.      Family/ staff Communication: plan of care reviewed with the patient, patient's POA daughter, and charge nurse.   Labs/tests ordered: none  Time spend 25 minutes.

## 2017-04-20 NOTE — Assessment & Plan Note (Signed)
dementia, resides in SNF, one person transfer, ambulates with walker with SBA, wheelchair to get around otherwise. She takes Memantine 28mg  daily for memory. Will encourage the patient walk with walker to and from dinning room with SBA

## 2017-04-21 DIAGNOSIS — Z7901 Long term (current) use of anticoagulants: Secondary | ICD-10-CM | POA: Diagnosis not present

## 2017-04-26 DIAGNOSIS — R791 Abnormal coagulation profile: Secondary | ICD-10-CM | POA: Diagnosis not present

## 2017-04-26 DIAGNOSIS — Z5181 Encounter for therapeutic drug level monitoring: Secondary | ICD-10-CM | POA: Diagnosis not present

## 2017-05-03 DIAGNOSIS — Z5181 Encounter for therapeutic drug level monitoring: Secondary | ICD-10-CM | POA: Diagnosis not present

## 2017-05-04 ENCOUNTER — Non-Acute Institutional Stay (SKILLED_NURSING_FACILITY): Payer: PPO | Admitting: Nurse Practitioner

## 2017-05-04 ENCOUNTER — Encounter: Payer: Self-pay | Admitting: Nurse Practitioner

## 2017-05-04 DIAGNOSIS — I480 Paroxysmal atrial fibrillation: Secondary | ICD-10-CM

## 2017-05-04 DIAGNOSIS — F418 Other specified anxiety disorders: Secondary | ICD-10-CM | POA: Diagnosis not present

## 2017-05-04 DIAGNOSIS — I481 Persistent atrial fibrillation: Secondary | ICD-10-CM | POA: Diagnosis not present

## 2017-05-04 DIAGNOSIS — F0390 Unspecified dementia without behavioral disturbance: Secondary | ICD-10-CM | POA: Diagnosis not present

## 2017-05-04 NOTE — Progress Notes (Signed)
Location:  [facility] Room Number: 21 Place of Service:  SNF (31) Provider:  Flossie Wexler, Manxie  NP  Blanchie Serve, MD  Patient Care Team: Blanchie Serve, MD as PCP - General (Internal Medicine) Rosanne Wohlfarth X, NP as Nurse Practitioner (Nurse Practitioner) Wilford Corner, MD as Consulting Physician (Gastroenterology) Sanda Klein, MD as Consulting Physician (Cardiology)  Extended Emergency Contact Information Primary Emergency Contact: [name],Pat Address: [place] [address] [name] of [place] Phone: [phone] Mobile Phone: [phone] Relation: Daughter  Code Status:  DNR Goals of care: Advanced Directive information Advanced Directives 05/04/2017  Does Patient Have a Medical Advance Directive? Yes  Type of Paramedic of [place];Out of facility DNR (pink MOST or yellow form)  Does patient want to make changes to medical advance directive? No - Patient declined  Copy of [place] in Chart? Yes  Pre-existing out of facility DNR order (yellow form or pink MOST form) Yellow form placed in chart (order not valid for inpatient use)     Chief Complaint  Patient presents with  . Acute Visit    Behaviors- yelling out, getting out of wc, getting out of bed without assistance    HPI:  Pt is a 84 y.o. female seen today for an acute visit for yelling " I need help, get me out of here, I am going home, trying to get out of w/c, climbing out of recliner, waking in hall way saying loudly "where is everyone at", ambulation helped momentarily. Hx of dementia, on Memantine for memory. Off Lexapro 02/10/17. Hx of afib, heart rate is in control, on Diltiazem 120mg  qd, Bystolic 2.5mg  qd. Coumadin for thromboembolic risk reduction.    Past Medical History:  Diagnosis Date  . Anemia, unspecified   . Anorexia   . Anxiety   . Arthritis   . Atherosclerotic cerebrovascular disease   . Backache,  unspecified   . Cataract   . Cholelithiasis   . Chronic kidney disease, unspecified   . Closed fracture of right inferior pubic ramus ([place]) 11/12/2013   11/10/13 s/p fall, ED eval  X-ray R hip  1. Possible nondisplaced right inferior pubic ramus fracture.  2. No femur fracture or dislocation   02/06/14 dc prn Motrin and Norco-not used    . Depression   . Diaphragmatic hernia without mention of obstruction or gangrene   . Diseases of lips 03/26/2013  . Diverticulosis of colon (without mention of hemorrhage)   . Hemorrhage of rectum and anus 03/26/2013  . Hyperlipidemia   . Hypertension   . Insomnia, unspecified   . Laceration of occipital scalp 11/12/2013  . Macular degeneration 06/28/2013  . Macular degeneration (senile) of retina, unspecified   . Osteoporosis   . Other malaise and fatigue   . Other sleep disturbances   . Other specified cardiac dysrhythmias(427.89)   . Other specified personal history presenting hazards to health(V15.89)   . Other symptoms involving cardiovascular system   . Pancreatitis   . Personal history of other diseases of circulatory system   . Personal history of other diseases of digestive system    pancreatitis from gallstones  . Personal history of other diseases of respiratory system   . Personal history of traumatic fracture   . Rosacea   . Senile osteoporosis    with old T11 fracture  . Thyroid disease   . Unspecified disorders of arteries and arterioles   .  Unspecified hearing loss   . Unspecified hemorrhoids without mention of complication    Past Surgical History:  Procedure Laterality Date  . ABDOMINAL HYSTERECTOMY     and BSO fibroids  . COLONOSCOPY  05/23/2007  . EYE LID LIFT Right 09/2016   OD  . HIP ARTHROPLASTY Left 10/31/2016   Procedure: ARTHROPLASTY  HIP (HEMIARTHROPLASTY);  Surgeon: Paralee Cancel, MD;  Location: [facility];  Service: Orthopedics;  Laterality: Left;  . JOINT REPLACEMENT  1996  . TOTAL HIP ARTHROPLASTY Right     Allergies    Allergen Reactions  . Ace Inhibitors Cough  . Mirtazapine Other (See Comments)    Nightmares   . Penicillins Rash    Has patient had a PCN reaction causing immediate rash, facial/tongue/throat swelling, SOB or lightheadedness with hypotension: Yes Has patient had a PCN reaction causing severe rash involving mucus membranes or skin necrosis: Unknown Has patient had a PCN reaction that required hospitalization: Unknown Has patient had a PCN reaction occurring within the last 10 years: Unknown If all of the above answers are "NO", then may proceed with Cephalosporin use.     Outpatient Encounter Medications as of 05/04/2017  Medication Sig  . acetaminophen (TYLENOL) 500 MG tablet Take 1,000 mg by mouth every 8 (eight) hours as needed for mild pain.  Marland Kitchen alum & mag hydroxide-simeth (MAALOX/MYLANTA) 200-200-20 MG/5ML suspension Take 30 mLs by mouth every 4 (four) hours as needed for indigestion.  . cholecalciferol 2000 units TABS Take 1 tablet (2,000 Units total) by mouth daily.  . clindamycin (CLEOCIN) 150 MG capsule Take 600 mg by mouth See admin instructions. ONE HOUR PRIOR TO DENTAL APPOINTMENTS  . Dextromethorphan-Guaifenesin (ROBAFEN DM) 10-100 MG/5ML liquid Take 10 mLs by mouth every 6 (six) hours as needed.  . diltiazem (CARDIZEM CD) 120 MG 24 hr capsule Take 120 mg by mouth daily.  . hydrocortisone cream 1 % Apply 1 application topically 2 (two) times daily. TO HEMORRHOIDS  . levothyroxine (SYNTHROID, LEVOTHROID) 25 MCG tablet Take 25 mcg by mouth daily before breakfast.   . Lidocaine (ASPERCREME LIDOCAINE) 4 % PTCH Apply 1 patch topically at bedtime. APPLY TO LOWER BACK AND REMOVE IN THE MORNING  . loratadine (CLARITIN) 10 MG tablet Take 10 mg by mouth daily as needed for allergies.  . memantine (NAMENDA XR) 28 MG CP24 24 hr capsule Take 28 mg by mouth.  . nebivolol (BYSTOLIC) 2.5 MG tablet Take 2.5 mg by mouth daily.  . Nutritional Supplements (BENECALORIE PO) Take 1.5 oz by mouth 2  (two) times daily.   . ondansetron (ZOFRAN) 4 MG tablet Take 1 tablet (4 mg total) by mouth every 6 (six) hours as needed for nausea.  . polyethylene glycol (MIRALAX / GLYCOLAX) packet Take 17 g by mouth every other day. For constipation and hold for loose stools  . polyvinyl alcohol (LIQUIFILM TEARS) 1.4 % ophthalmic solution Place 1 drop into both eyes 4 (four) times daily.   Marland Kitchen warfarin (COUMADIN) 3 MG tablet Take 3 mg by mouth. Monday, Wednesday, Friday, Saturday and Sunday   No facility-administered encounter medications on file as of 05/04/2017.    ROS was provided with assistance of staff Review of Systems  Constitutional: Negative for activity change, appetite change, chills, diaphoresis, fatigue and fever.  Respiratory: Negative for cough, chest tightness, shortness of breath and wheezing.   Cardiovascular: Negative for chest pain, palpitations and leg swelling.  Musculoskeletal: Positive for gait problem.  Neurological: Negative for speech difficulty, weakness and headaches.  Dementia  Psychiatric/Behavioral: Positive for agitation, behavioral problems and confusion. Negative for hallucinations and sleep disturbance. The patient is nervous/anxious.     Immunization History  Administered Date(s) Administered  . DTaP 10/12/2012  . Influenza-Unspecified 11/22/2012, 12/26/2013, 11/12/2014, 12/09/2015, 12/14/2016  . PPD Test 12/06/2013  . Pneumococcal Conjugate-13 11/05/2016  . Pneumococcal Polysaccharide-23 09/22/2000  . Td 07/08/2005  . Tdap 11/10/2013   Pertinent  Health Maintenance Due  Topic Date Due  . INFLUENZA VACCINE  Completed  . DEXA SCAN  Completed  . PNA vac Low Risk Adult  Completed   Fall Risk  10/22/2016 01/13/2015 06/28/2013  Falls in the past year? No No No  Risk for fall due to : - Impaired balance/gait -   Functional Status Survey:    Vitals:   05/04/17 1206  BP: (!) 160/70  Pulse: 60  Resp: 18  Temp: 98.4 F (36.9 C)  SpO2: 96%  Weight: 111  lb 4.8 oz (50.5 kg)  Height: 5' (1.524 m)   Body mass index is 21.74 kg/m. Physical Exam  Constitutional: She appears well-developed and well-nourished.  HENT:  Head: Normocephalic and atraumatic.  Eyes: Conjunctivae and EOM are normal. Pupils are equal, round, and reactive to light.  Neck: No JVD present.  Cardiovascular: Normal rate and regular rhythm.  Murmur heard. Pulmonary/Chest: Effort normal and breath sounds normal. She has no wheezes. She has no rales.  Abdominal: Soft. Bowel sounds are normal. She exhibits no distension. There is no tenderness.  Musculoskeletal:  Ambulates with walker with SBA, transfers self  Neurological: She is alert.  Oriented to person and her room on unit  Skin: Skin is warm.  Psychiatric:  Restless, agitation, yelling out, anxious per report. The patient was calm upon my visit today.     Labs reviewed: Recent Labs    10/31/16 0236 11/01/16 0353 11/02/16 0445 11/11/16 01/04/17  NA 140 140 139 142 141  K 3.7 3.8 3.9 3.6 3.8  CL 112* 113* 112*  --   --   CO2 21* 21* 20*  --   --   GLUCOSE 108* 149* 110*  --   --   BUN 27* 32* 24* 19 28*  CREATININE 0.90 0.90 0.80 0.8 0.8  CALCIUM 8.2* 8.3* 8.3*  --   --    Recent Labs    06/29/16 08/31/16 10/28/16 0329 01/04/17  AST 29 19  --  17  ALT 14 12  --  10  ALKPHOS 50 70  --  75  ALBUMIN  --   --  3.0*  --    Recent Labs    10/27/16 1715  11/01/16 0353 11/02/16 0445 11/03/16 0318 11/11/16 01/04/17  WBC 9.6   < > 6.0 7.4 8.4 6.4 5.5  NEUTROABS 8.4*  --   --   --   --   --   --   HGB 12.4   < > 10.0* 9.5* 10.3* 9.8* 11.3*  HCT 39.1   < > 31.2* 29.8* 32.6* 29* 33*  MCV 94.7   < > 94.8 96.1 95.0  --   --   PLT 174   < > 179 163 197 419* 254   < > = values in this interval not displayed.   Lab Results  Component Value Date   TSH 2.048 10/27/2016   No results found for: HGBA1C No results found for: CHOL, HDL, LDLCALC, LDLDIRECT, TRIG, CHOLHDL  Significant Diagnostic Results in last  30 days:  No results found.  Assessment/Plan  Depression with anxiety yelling " I need help, get me out of here, I am going home, trying to get out of w/c, climbing out of recliner, waking in hall way saying loudly "where is everyone at", ambulation helped momentarily. Hx of dementia, on Memantine for memory. Off Lexapro 02/10/17. Failed 11/17/15 pharm discontinued Lexapro recommendation. Will resume Lexapro 5mg  daily. Assist the patient with ambulation 3x a day. Observe.    Senile dementia Continue to reside in SNF, assist the patient to ambulate frequently, continue Memantine for memory   A-fib (HCC) Hx of afib, heart rate is in control, continue Diltiazem 120mg  qd, Bystolic 2.5mg  qd. Coumadin for thromboembolic risk reduction.       Family/ staff Communication: plan of care reviewed with the patient and charge nurse.   Labs/tests ordered:  none  Time spend 25 minutes.

## 2017-05-04 NOTE — Assessment & Plan Note (Addendum)
yelling " I need help, get me out of here, I am going home, trying to get out of w/c, climbing out of recliner, waking in hall way saying loudly "where is everyone at", ambulation helped momentarily. Hx of dementia, on Memantine for memory. Off Lexapro 02/10/17. Failed 11/17/15 pharm discontinued Lexapro recommendation. Will resume Lexapro 5mg  daily. Assist the patient with ambulation 3x a day. Observe.

## 2017-05-04 NOTE — Assessment & Plan Note (Signed)
Hx of afib, heart rate is in control, continue Diltiazem 120mg  qd, Bystolic 2.5mg  qd. Coumadin for thromboembolic risk reduction.

## 2017-05-04 NOTE — Assessment & Plan Note (Signed)
Continue to reside in SNF, assist the patient to ambulate frequently, continue Memantine for memory

## 2017-05-05 ENCOUNTER — Other Ambulatory Visit: Payer: Self-pay | Admitting: *Deleted

## 2017-05-05 LAB — POCT INR: INR: 3.2 — AB (ref <=1.1)

## 2017-05-05 LAB — PROTIME-INR: Protime: 33.8 — AB (ref 10.0–13.8)

## 2017-05-06 DIAGNOSIS — I481 Persistent atrial fibrillation: Secondary | ICD-10-CM | POA: Diagnosis not present

## 2017-05-10 ENCOUNTER — Other Ambulatory Visit: Payer: Self-pay | Admitting: *Deleted

## 2017-05-10 DIAGNOSIS — Z7901 Long term (current) use of anticoagulants: Secondary | ICD-10-CM | POA: Diagnosis not present

## 2017-05-10 LAB — PROTIME-INR: Protime: 20.9 — AB (ref 10.0–13.8)

## 2017-05-10 LAB — POCT INR: INR: 2 — AB (ref <=1.1)

## 2017-05-11 DIAGNOSIS — F0390 Unspecified dementia without behavioral disturbance: Secondary | ICD-10-CM | POA: Diagnosis not present

## 2017-05-11 DIAGNOSIS — M6281 Muscle weakness (generalized): Secondary | ICD-10-CM | POA: Diagnosis not present

## 2017-05-11 DIAGNOSIS — R488 Other symbolic dysfunctions: Secondary | ICD-10-CM | POA: Diagnosis not present

## 2017-05-11 DIAGNOSIS — F418 Other specified anxiety disorders: Secondary | ICD-10-CM | POA: Diagnosis not present

## 2017-05-11 DIAGNOSIS — Z9181 History of falling: Secondary | ICD-10-CM | POA: Diagnosis not present

## 2017-05-11 DIAGNOSIS — R351 Nocturia: Secondary | ICD-10-CM | POA: Diagnosis not present

## 2017-05-11 DIAGNOSIS — I10 Essential (primary) hypertension: Secondary | ICD-10-CM | POA: Diagnosis not present

## 2017-05-11 DIAGNOSIS — L299 Pruritus, unspecified: Secondary | ICD-10-CM | POA: Diagnosis not present

## 2017-05-11 DIAGNOSIS — R262 Difficulty in walking, not elsewhere classified: Secondary | ICD-10-CM | POA: Diagnosis not present

## 2017-05-11 DIAGNOSIS — R296 Repeated falls: Secondary | ICD-10-CM | POA: Diagnosis not present

## 2017-05-11 DIAGNOSIS — R413 Other amnesia: Secondary | ICD-10-CM | POA: Diagnosis not present

## 2017-05-11 DIAGNOSIS — E039 Hypothyroidism, unspecified: Secondary | ICD-10-CM | POA: Diagnosis not present

## 2017-05-11 DIAGNOSIS — M899 Disorder of bone, unspecified: Secondary | ICD-10-CM | POA: Diagnosis not present

## 2017-05-11 DIAGNOSIS — R29898 Other symptoms and signs involving the musculoskeletal system: Secondary | ICD-10-CM | POA: Diagnosis not present

## 2017-05-11 DIAGNOSIS — K5900 Constipation, unspecified: Secondary | ICD-10-CM | POA: Diagnosis not present

## 2017-05-11 DIAGNOSIS — R41841 Cognitive communication deficit: Secondary | ICD-10-CM | POA: Diagnosis not present

## 2017-05-11 DIAGNOSIS — R2689 Other abnormalities of gait and mobility: Secondary | ICD-10-CM | POA: Diagnosis not present

## 2017-05-11 DIAGNOSIS — M8580 Other specified disorders of bone density and structure, unspecified site: Secondary | ICD-10-CM | POA: Diagnosis not present

## 2017-05-17 ENCOUNTER — Encounter: Payer: Self-pay | Admitting: Nurse Practitioner

## 2017-05-17 ENCOUNTER — Non-Acute Institutional Stay (SKILLED_NURSING_FACILITY): Payer: PPO | Admitting: Nurse Practitioner

## 2017-05-17 DIAGNOSIS — Z7901 Long term (current) use of anticoagulants: Secondary | ICD-10-CM

## 2017-05-17 DIAGNOSIS — I10 Essential (primary) hypertension: Secondary | ICD-10-CM

## 2017-05-17 DIAGNOSIS — K5901 Slow transit constipation: Secondary | ICD-10-CM | POA: Diagnosis not present

## 2017-05-17 DIAGNOSIS — F0390 Unspecified dementia without behavioral disturbance: Secondary | ICD-10-CM | POA: Diagnosis not present

## 2017-05-17 DIAGNOSIS — E038 Other specified hypothyroidism: Secondary | ICD-10-CM

## 2017-05-17 DIAGNOSIS — E063 Autoimmune thyroiditis: Secondary | ICD-10-CM | POA: Diagnosis not present

## 2017-05-17 DIAGNOSIS — Z5181 Encounter for therapeutic drug level monitoring: Secondary | ICD-10-CM | POA: Diagnosis not present

## 2017-05-17 DIAGNOSIS — F418 Other specified anxiety disorders: Secondary | ICD-10-CM

## 2017-05-17 DIAGNOSIS — I480 Paroxysmal atrial fibrillation: Secondary | ICD-10-CM | POA: Diagnosis not present

## 2017-05-17 DIAGNOSIS — I481 Persistent atrial fibrillation: Secondary | ICD-10-CM | POA: Diagnosis not present

## 2017-05-17 NOTE — Assessment & Plan Note (Signed)
Blood pressure is controlled, continue Diltiazem and Bystolic.

## 2017-05-17 NOTE — Assessment & Plan Note (Signed)
Sub therapeutic INR 1.5 today 05/17/17, will administer Coumadin 4mg /2mg  05/17/17, the continue 2mg  M-F, increase to 3mg  on Sat + Sun, repeat PT/INR in one week.

## 2017-05-17 NOTE — Assessment & Plan Note (Signed)
Continue Levothyroxine 78mcg po daily, update TSH CBC CMP

## 2017-05-17 NOTE — Progress Notes (Signed)
Location:  [facility] [facility] Room Number: 109 Place of Service:  SNF (31) Provider:  Rylan Kaufmann, Manxie  NP  Blanchie Serve, MD  Patient Care Team: Blanchie Serve, MD as PCP - General (Internal Medicine) Vidalia Serpas X, NP as Nurse Practitioner (Nurse Practitioner) Wilford Corner, MD as Consulting Physician (Gastroenterology) Sanda Klein, MD as Consulting Physician (Cardiology)  Extended Emergency Contact Information Primary Emergency Contact: [name],Pat Address: [place] [address] [name] of [place] Phone: [phone] Mobile Phone: [phone] Relation: Daughter  Code Status:  DNR Goals of care: Advanced Directive information Advanced Directives 05/04/2017  Does Patient Have a Medical Advance Directive? Yes  Type of Paramedic of [place];Out of facility DNR (pink MOST or yellow form)  Does patient want to make changes to medical advance directive? No - Patient declined  Copy of [place] in Chart? Yes  Pre-existing out of facility DNR order (yellow form or pink MOST form) Yellow form placed in chart (order not valid for inpatient use)     Chief Complaint  Patient presents with  . Medical Management of Chronic Issues    HPI:  Pt is a 84 y.o. female seen today for medical management of chronic diseases.    The patient has history of dementia, resides in SNF [facility], self propels wheelchair to get around, one person assist for transfer and ambulation with walker, on Memantine 28mg  qd for memory. Her emotional outbursts, irritability, anxiety is not well controlled, Lexapro 5mg  resumed 05/04/17, limited improvement. Afib, heart rate is in control, on Diltiazem 120mg  qd, Bystolic 2.5mg  qd. No constipation while on MiraLax daily. She takes Levothyroxine 26mcg po daily, no recent TSH   Past Medical History:  Diagnosis Date  . Anemia, unspecified   . Anorexia   . Anxiety   . Arthritis   .  Atherosclerotic cerebrovascular disease   . Backache, unspecified   . Cataract   . Cholelithiasis   . Chronic kidney disease, unspecified   . Closed fracture of right inferior pubic ramus ([place]) 11/12/2013   11/10/13 s/p fall, ED eval  X-ray R hip  1. Possible nondisplaced right inferior pubic ramus fracture.  2. No femur fracture or dislocation   02/06/14 dc prn Motrin and Norco-not used    . Depression   . Diaphragmatic hernia without mention of obstruction or gangrene   . Diseases of lips 03/26/2013  . Diverticulosis of colon (without mention of hemorrhage)   . Hemorrhage of rectum and anus 03/26/2013  . Hyperlipidemia   . Hypertension   . Insomnia, unspecified   . Laceration of occipital scalp 11/12/2013  . Macular degeneration 06/28/2013  . Macular degeneration (senile) of retina, unspecified   . Osteoporosis   . Other malaise and fatigue   . Other sleep disturbances   . Other specified cardiac dysrhythmias(427.89)   . Other specified personal history presenting hazards to health(V15.89)   . Other symptoms involving cardiovascular system   . Pancreatitis   . Personal history of other diseases of circulatory system   . Personal history of other diseases of digestive system    pancreatitis from gallstones  . Personal history of other diseases of respiratory system   . Personal history of traumatic fracture   . Rosacea   . Senile osteoporosis    with old T11 fracture  . Thyroid disease   . Unspecified disorders of arteries and arterioles   . Unspecified hearing  loss   . Unspecified hemorrhoids without mention of complication    Past Surgical History:  Procedure Laterality Date  . ABDOMINAL HYSTERECTOMY     and BSO fibroids  . COLONOSCOPY  05/23/2007  . EYE LID LIFT Right 09/2016   OD  . HIP ARTHROPLASTY Left 10/31/2016   Procedure: ARTHROPLASTY  HIP (HEMIARTHROPLASTY);  Surgeon: Paralee Cancel, MD;  Location: [place];  Service: Orthopedics;  Laterality: Left;  . JOINT REPLACEMENT   1996  . TOTAL HIP ARTHROPLASTY Right     Allergies  Allergen Reactions  . Ace Inhibitors Cough  . Mirtazapine Other (See Comments)    Nightmares   . Penicillins Rash    Has patient had a PCN reaction causing immediate rash, facial/tongue/throat swelling, SOB or lightheadedness with hypotension: Yes Has patient had a PCN reaction causing severe rash involving mucus membranes or skin necrosis: Unknown Has patient had a PCN reaction that required hospitalization: Unknown Has patient had a PCN reaction occurring within the last 10 years: Unknown If all of the above answers are "NO", then may proceed with Cephalosporin use.     Outpatient Encounter Medications as of 05/17/2017  Medication Sig  . acetaminophen (TYLENOL) 500 MG tablet Take 1,000 mg by mouth every 8 (eight) hours as needed for mild pain.  Marland Kitchen alum & mag hydroxide-simeth (MAALOX/MYLANTA) 200-200-20 MG/5ML suspension Take 30 mLs by mouth every 4 (four) hours as needed for indigestion.  . cholecalciferol 2000 units TABS Take 1 tablet (2,000 Units total) by mouth daily.  . clindamycin (CLEOCIN) 150 MG capsule Take 600 mg by mouth See admin instructions. ONE HOUR PRIOR TO DENTAL APPOINTMENTS  . Dextromethorphan-Guaifenesin (ROBAFEN DM) 10-100 MG/5ML liquid Take 10 mLs by mouth every 6 (six) hours as needed.  . diltiazem (CARDIZEM CD) 120 MG 24 hr capsule Take 120 mg by mouth daily.  . hydrocortisone cream 1 % Apply 1 application topically 2 (two) times daily. TO HEMORRHOIDS  . levothyroxine (SYNTHROID, LEVOTHROID) 25 MCG tablet Take 25 mcg by mouth daily before breakfast.   . Lidocaine (ASPERCREME LIDOCAINE) 4 % PTCH Apply 1 patch topically at bedtime. APPLY TO LOWER BACK AND REMOVE IN THE MORNING  . loratadine (CLARITIN) 10 MG tablet Take 10 mg by mouth daily as needed for allergies.  . memantine (NAMENDA XR) 28 MG CP24 24 hr capsule Take 28 mg by mouth.  . nebivolol (BYSTOLIC) 2.5 MG tablet Take 2.5 mg by mouth daily.  .  Nutritional Supplements (BENECALORIE PO) Take 1.5 oz by mouth 2 (two) times daily.   . ondansetron (ZOFRAN) 4 MG tablet Take 1 tablet (4 mg total) by mouth every 6 (six) hours as needed for nausea.  . polyethylene glycol (MIRALAX / GLYCOLAX) packet Take 17 g by mouth every other day. For constipation and hold for loose stools  . polyvinyl alcohol (LIQUIFILM TEARS) 1.4 % ophthalmic solution Place 1 drop into both eyes 4 (four) times daily.   Marland Kitchen warfarin (COUMADIN) 2 MG tablet Take 2 mg by mouth daily.  . [DISCONTINUED] warfarin (COUMADIN) 3 MG tablet Take 3 mg by mouth. Monday, Wednesday, Friday, Saturday and Sunday   No facility-administered encounter medications on file as of 05/17/2017.    ROS was provided with assistance of staff Review of Systems  Constitutional: Negative for activity change and appetite change.  HENT: Positive for hearing loss. Negative for congestion, trouble swallowing and voice change.   Eyes: Negative for discharge.  Respiratory: Negative for cough, choking, chest tightness and shortness of breath.  Cardiovascular: Positive for leg swelling. Negative for chest pain and palpitations.  Gastrointestinal: Negative for abdominal distention and abdominal pain.  Genitourinary: Negative for difficulty urinating, dysuria and urgency.       Incontinent of urine.   Musculoskeletal: Positive for gait problem.  Skin: Negative for color change and pallor.  Neurological: Negative for speech difficulty, weakness and headaches.       Dementia  Psychiatric/Behavioral: Positive for agitation, behavioral problems and confusion. Negative for hallucinations and sleep disturbance. The patient is nervous/anxious and is hyperactive.     Immunization History  Administered Date(s) Administered  . DTaP 10/12/2012  . Influenza-Unspecified 11/22/2012, 12/26/2013, 11/12/2014, 12/09/2015, 12/14/2016  . PPD Test 12/06/2013  . Pneumococcal Conjugate-13 11/05/2016  . Pneumococcal  Polysaccharide-23 09/22/2000  . Td 07/08/2005  . Tdap 11/10/2013   Pertinent  Health Maintenance Due  Topic Date Due  . INFLUENZA VACCINE  Completed  . DEXA SCAN  Completed  . PNA vac Low Risk Adult  Completed   Fall Risk  10/22/2016 01/13/2015 06/28/2013  Falls in the past year? No No No  Risk for fall due to : - Impaired balance/gait -   Functional Status Survey:    Vitals:   05/17/17 1213  BP: 132/68  Pulse: 70  Resp: 18  Temp: 98.2 F (36.8 C)  SpO2: 95%  Weight: 111 lb 4.8 oz (50.5 kg)  Height: 5' (1.524 m)   Body mass index is 21.74 kg/m. Physical Exam  Constitutional: She appears well-developed and well-nourished. No distress.  HENT:  Head: Normocephalic and atraumatic.  Eyes: Pupils are equal, round, and reactive to light. Conjunctivae and EOM are normal.  Neck: Normal range of motion. Neck supple. No JVD present. No thyromegaly present.  Cardiovascular: Normal rate.  Murmur heard. Irregular heart beats.   Pulmonary/Chest: Effort normal and breath sounds normal. She has no wheezes. She has no rales.  Abdominal: Soft. Bowel sounds are normal. She exhibits no distension. There is no tenderness.  Musculoskeletal: Normal range of motion. She exhibits edema. She exhibits no tenderness.  One person transfer and ambulating with walker  Neurological: She is alert. She exhibits normal muscle tone. Coordination normal.  Skin: Skin is warm and dry. She is not diaphoretic.  Psychiatric:  Irritable, yelling out, repetitive, restless    Labs reviewed: Recent Labs    10/31/16 0236 11/01/16 0353 11/02/16 0445 11/11/16 01/04/17  NA 140 140 139 142 141  K 3.7 3.8 3.9 3.6 3.8  CL 112* 113* 112*  --   --   CO2 21* 21* 20*  --   --   GLUCOSE 108* 149* 110*  --   --   BUN 27* 32* 24* 19 28*  CREATININE 0.90 0.90 0.80 0.8 0.8  CALCIUM 8.2* 8.3* 8.3*  --   --    Recent Labs    06/29/16 08/31/16 10/28/16 0329 01/04/17  AST 29 19  --  17  ALT 14 12  --  10  ALKPHOS 50  70  --  75  ALBUMIN  --   --  3.0*  --    Recent Labs    10/27/16 1715  11/01/16 0353 11/02/16 0445 11/03/16 0318 11/11/16 01/04/17  WBC 9.6   < > 6.0 7.4 8.4 6.4 5.5  NEUTROABS 8.4*  --   --   --   --   --   --   HGB 12.4   < > 10.0* 9.5* 10.3* 9.8* 11.3*  HCT 39.1   < > 31.2*  29.8* 32.6* 29* 33*  MCV 94.7   < > 94.8 96.1 95.0  --   --   PLT 174   < > 179 163 197 419* 254   < > = values in this interval not displayed.   Lab Results  Component Value Date   TSH 2.048 10/27/2016   No results found for: HGBA1C No results found for: CHOL, HDL, LDLCALC, LDLDIRECT, TRIG, CHOLHDL  Significant Diagnostic Results in last 30 days:  No results found.  Assessment/Plan Depression with anxiety Her emotional outbursts, irritability, anxiety is not well controlled,  Lexapro 5mg  resumed 05/04/17, limited improvement. Will increase Lexapro to 10mg , adding Lorazepam 0.5mg  q8h prn x 14 days. observe   Hypothyroidism Continue Levothyroxine 65mcg po daily, update TSH CBC CMP   HTN (hypertension) Blood pressure is controlled, continue Diltiazem and Bystolic.    Senile dementia dementia, resides in SNF [facility], self propels wheelchair to get around, one person assist for transfer and ambulation with walker, continue  Memantine 28mg  qd for memory.   A-fib (HCC) Afib, heart rate is in control, continue Diltiazem 120mg  qd, Bystolic 2.5mg  qd. Continue Coumadin for thromboembolic risk reduction  Constipation  No constipation, continue MiraLax daily.   Anticoagulation goal of INR 2 to 3 Sub therapeutic INR 1.5 today 05/17/17, will administer Coumadin 4mg /2mg  05/17/17, the continue 2mg  M-F, increase to 3mg  on Sat + Sun, repeat PT/INR in one week.      Family/ staff Communication: plan of care reviewed with the patient and charge nurse.   Labs/tests ordered: CBC/diff, CMP, TSH  Time spend 25 minutes.

## 2017-05-17 NOTE — Assessment & Plan Note (Signed)
Her emotional outbursts, irritability, anxiety is not well controlled,  Lexapro 5mg  resumed 05/04/17, limited improvement. Will increase Lexapro to 10mg , adding Lorazepam 0.5mg  q8h prn x 14 days. observe

## 2017-05-17 NOTE — Assessment & Plan Note (Signed)
dementia, resides in SNF [facility], self propels wheelchair to get around, one person assist for transfer and ambulation with walker, continue  Memantine 28mg  qd for memory.

## 2017-05-17 NOTE — Assessment & Plan Note (Signed)
No constipation, continue MiraLax daily.  

## 2017-05-17 NOTE — Assessment & Plan Note (Signed)
Afib, heart rate is in control, continue Diltiazem 120mg  qd, Bystolic 2.5mg  qd. Continue Coumadin for thromboembolic risk reduction

## 2017-05-19 DIAGNOSIS — E039 Hypothyroidism, unspecified: Secondary | ICD-10-CM | POA: Diagnosis not present

## 2017-05-19 DIAGNOSIS — I4891 Unspecified atrial fibrillation: Secondary | ICD-10-CM | POA: Diagnosis not present

## 2017-05-19 LAB — CBC AND DIFFERENTIAL
HEMATOCRIT: 30 — AB (ref 36–46)
Hemoglobin: 9.8 — AB (ref 12.0–16.0)
Platelets: 242 (ref 150–399)
WBC: 6.2

## 2017-05-19 LAB — TSH: TSH: 1.32 (ref <=5.90)

## 2017-05-19 LAB — BASIC METABOLIC PANEL
BUN: 39 — AB (ref 4–21)
CREATININE: 1.2 — AB (ref <=1.1)
Glucose: 87
POTASSIUM: 4.1 (ref 3.4–5.3)
SODIUM: 142 (ref 137–147)

## 2017-05-19 LAB — HEPATIC FUNCTION PANEL
ALT: 8 (ref 7–35)
AST: 16 (ref 13–35)
Alkaline Phosphatase: 70 (ref 25–125)
BILIRUBIN, TOTAL: 0.3

## 2017-05-20 ENCOUNTER — Other Ambulatory Visit: Payer: Self-pay | Admitting: *Deleted

## 2017-05-23 DIAGNOSIS — R296 Repeated falls: Secondary | ICD-10-CM | POA: Diagnosis not present

## 2017-05-23 DIAGNOSIS — R351 Nocturia: Secondary | ICD-10-CM | POA: Diagnosis not present

## 2017-05-23 DIAGNOSIS — E039 Hypothyroidism, unspecified: Secondary | ICD-10-CM | POA: Diagnosis not present

## 2017-05-23 DIAGNOSIS — F418 Other specified anxiety disorders: Secondary | ICD-10-CM | POA: Diagnosis not present

## 2017-05-23 DIAGNOSIS — M8580 Other specified disorders of bone density and structure, unspecified site: Secondary | ICD-10-CM | POA: Diagnosis not present

## 2017-05-23 DIAGNOSIS — R2689 Other abnormalities of gait and mobility: Secondary | ICD-10-CM | POA: Diagnosis not present

## 2017-05-23 DIAGNOSIS — R413 Other amnesia: Secondary | ICD-10-CM | POA: Diagnosis not present

## 2017-05-23 DIAGNOSIS — K5900 Constipation, unspecified: Secondary | ICD-10-CM | POA: Diagnosis not present

## 2017-05-23 DIAGNOSIS — Z9181 History of falling: Secondary | ICD-10-CM | POA: Diagnosis not present

## 2017-05-23 DIAGNOSIS — F0390 Unspecified dementia without behavioral disturbance: Secondary | ICD-10-CM | POA: Diagnosis not present

## 2017-05-23 DIAGNOSIS — R488 Other symbolic dysfunctions: Secondary | ICD-10-CM | POA: Diagnosis not present

## 2017-05-23 DIAGNOSIS — M6281 Muscle weakness (generalized): Secondary | ICD-10-CM | POA: Diagnosis not present

## 2017-05-23 DIAGNOSIS — R41841 Cognitive communication deficit: Secondary | ICD-10-CM | POA: Diagnosis not present

## 2017-05-23 DIAGNOSIS — R2681 Unsteadiness on feet: Secondary | ICD-10-CM | POA: Diagnosis not present

## 2017-05-23 DIAGNOSIS — R29898 Other symptoms and signs involving the musculoskeletal system: Secondary | ICD-10-CM | POA: Diagnosis not present

## 2017-05-23 DIAGNOSIS — R1312 Dysphagia, oropharyngeal phase: Secondary | ICD-10-CM | POA: Diagnosis not present

## 2017-05-23 DIAGNOSIS — L299 Pruritus, unspecified: Secondary | ICD-10-CM | POA: Diagnosis not present

## 2017-05-24 DIAGNOSIS — I481 Persistent atrial fibrillation: Secondary | ICD-10-CM | POA: Diagnosis not present

## 2017-05-31 DIAGNOSIS — I481 Persistent atrial fibrillation: Secondary | ICD-10-CM | POA: Diagnosis not present

## 2017-06-02 DIAGNOSIS — H02132 Senile ectropion of right lower eyelid: Secondary | ICD-10-CM | POA: Diagnosis not present

## 2017-06-02 DIAGNOSIS — H353134 Nonexudative age-related macular degeneration, bilateral, advanced atrophic with subfoveal involvement: Secondary | ICD-10-CM | POA: Diagnosis not present

## 2017-06-02 DIAGNOSIS — H35351 Cystoid macular degeneration, right eye: Secondary | ICD-10-CM | POA: Diagnosis not present

## 2017-06-02 DIAGNOSIS — H353232 Exudative age-related macular degeneration, bilateral, with inactive choroidal neovascularization: Secondary | ICD-10-CM | POA: Diagnosis not present

## 2017-06-07 DIAGNOSIS — I481 Persistent atrial fibrillation: Secondary | ICD-10-CM | POA: Diagnosis not present

## 2017-06-09 ENCOUNTER — Non-Acute Institutional Stay (SKILLED_NURSING_FACILITY): Payer: PPO | Admitting: Nurse Practitioner

## 2017-06-09 DIAGNOSIS — N183 Chronic kidney disease, stage 3 unspecified: Secondary | ICD-10-CM

## 2017-06-09 DIAGNOSIS — F0391 Unspecified dementia with behavioral disturbance: Secondary | ICD-10-CM | POA: Diagnosis not present

## 2017-06-09 DIAGNOSIS — F03918 Unspecified dementia, unspecified severity, with other behavioral disturbance: Secondary | ICD-10-CM

## 2017-06-09 DIAGNOSIS — F418 Other specified anxiety disorders: Secondary | ICD-10-CM | POA: Diagnosis not present

## 2017-06-09 DIAGNOSIS — D638 Anemia in other chronic diseases classified elsewhere: Secondary | ICD-10-CM | POA: Diagnosis not present

## 2017-06-09 NOTE — Assessment & Plan Note (Signed)
Creat has trended up 1.2 05/19/17 from 0.8 01/04/17. Update CMP

## 2017-06-09 NOTE — Assessment & Plan Note (Signed)
05/19/17 Hgb 9.8, negative FOBT, no sign of bleed, continue to observe. Her baseline Hgb 10-11. Update CBC, TIBC, ferritin, Fe, FeSat, Vit B12, Folate.

## 2017-06-09 NOTE — Progress Notes (Signed)
Location:  [place] Room Number: 17 Place of Service:  SNF (31) Provider:  Lasalle Abee, ManXie  NP  Blanchie Serve, MD  Patient Care Team: Blanchie Serve, MD as PCP - General (Internal Medicine) Dicie Edelen X, NP as Nurse Practitioner (Nurse Practitioner) Wilford Corner, MD as Consulting Physician (Gastroenterology) Sanda Klein, MD as Consulting Physician (Cardiology)  Extended Emergency Contact Information Primary Emergency Contact: [name],Pat Address: [place] [address] [name] of [place] Phone: [phone] Mobile Phone: [phone] Relation: Daughter  Code Status:  DNR Goals of care: Advanced Directive information Advanced Directives 06/09/2017  Does Patient Have a Medical Advance Directive? Yes  Type of Paramedic of [place];Out of facility DNR (pink MOST or yellow form)  Does patient want to make changes to medical advance directive? No - Patient declined  Copy of [place] in Chart? Yes  Pre-existing out of facility DNR order (yellow form or pink MOST form) Yellow form placed in chart (order not valid for inpatient use)     Chief Complaint  Patient presents with  . Acute Visit    Behaviors/ Medication Update    HPI:  Pt is a 84 y.o. female seen today for an acute visit for anxiety, emotional outburst, prn Lorazepam 0.5mg  tid prn used almost daily in the past to manage her behaviors. She has history of dementia, resides in SNF [facility], one person transfer, w/c for mobility, ambulates with walker for a short distance with SBA assist, taking Memantine 28mg  for memory. Reviewed her labs from 05/19/17, noted dropping in Hgb 9.8, and trending up creat 1.2.    Past Medical History:  Diagnosis Date  . Anemia, unspecified   . Anorexia   . Anxiety   . Arthritis   . Atherosclerotic cerebrovascular disease   . Backache, unspecified   . Cataract   . Cholelithiasis   . Chronic  kidney disease, unspecified   . Closed fracture of right inferior pubic ramus ([place]) 11/12/2013   11/10/13 s/p fall, ED eval  X-ray R hip  1. Possible nondisplaced right inferior pubic ramus fracture.  2. No femur fracture or dislocation   02/06/14 dc prn Motrin and Norco-not used    . Depression   . Diaphragmatic hernia without mention of obstruction or gangrene   . Diseases of lips 03/26/2013  . Diverticulosis of colon (without mention of hemorrhage)   . Hemorrhage of rectum and anus 03/26/2013  . Hyperlipidemia   . Hypertension   . Insomnia, unspecified   . Laceration of occipital scalp 11/12/2013  . Macular degeneration 06/28/2013  . Macular degeneration (senile) of retina, unspecified   . Osteoporosis   . Other malaise and fatigue   . Other sleep disturbances   . Other specified cardiac dysrhythmias(427.89)   . Other specified personal history presenting hazards to health(V15.89)   . Other symptoms involving cardiovascular system   . Pancreatitis   . Personal history of other diseases of circulatory system   . Personal history of other diseases of digestive system    pancreatitis from gallstones  . Personal history of other diseases of respiratory system   . Personal history of traumatic fracture   . Rosacea   . Senile osteoporosis    with old T11 fracture  . Thyroid disease   . Unspecified disorders of arteries and arterioles   . Unspecified hearing loss   . Unspecified hemorrhoids without mention of complication  Past Surgical History:  Procedure Laterality Date  . ABDOMINAL HYSTERECTOMY     and BSO fibroids  . COLONOSCOPY  05/23/2007  . EYE LID LIFT Right 09/2016   OD  . HIP ARTHROPLASTY Left 10/31/2016   Procedure: ARTHROPLASTY  HIP (HEMIARTHROPLASTY);  Surgeon: Paralee Cancel, MD;  Location: [place];  Service: Orthopedics;  Laterality: Left;  . JOINT REPLACEMENT  1996  . TOTAL HIP ARTHROPLASTY Right     Allergies  Allergen Reactions  . Ace Inhibitors Cough  . Mirtazapine  Other (See Comments)    Nightmares   . Penicillins Rash    Has patient had a PCN reaction causing immediate rash, facial/tongue/throat swelling, SOB or lightheadedness with hypotension: Yes Has patient had a PCN reaction causing severe rash involving mucus membranes or skin necrosis: Unknown Has patient had a PCN reaction that required hospitalization: Unknown Has patient had a PCN reaction occurring within the last 10 years: Unknown If all of the above answers are "NO", then may proceed with Cephalosporin use.     Outpatient Encounter Medications as of 06/09/2017  Medication Sig  . acetaminophen (TYLENOL) 500 MG tablet Take 1,000 mg by mouth every 8 (eight) hours as needed for mild pain.  Marland Kitchen alum & mag hydroxide-simeth (MAALOX/MYLANTA) 200-200-20 MG/5ML suspension Take 30 mLs by mouth every 4 (four) hours as needed for indigestion.  . cholecalciferol 2000 units TABS Take 1 tablet (2,000 Units total) by mouth daily.  . clindamycin (CLEOCIN) 150 MG capsule Take 600 mg by mouth See admin instructions. ONE HOUR PRIOR TO DENTAL APPOINTMENTS  . Dextromethorphan-Guaifenesin (ROBAFEN DM) 10-100 MG/5ML liquid Take 10 mLs by mouth every 6 (six) hours as needed.  . diltiazem (CARDIZEM CD) 120 MG 24 hr capsule Take 120 mg by mouth daily.  . hydrocortisone cream 1 % Apply 1 application topically 2 (two) times daily. TO HEMORRHOIDS  . levothyroxine (SYNTHROID, LEVOTHROID) 25 MCG tablet Take 25 mcg by mouth daily before breakfast.   . Lidocaine (ASPERCREME LIDOCAINE) 4 % PTCH Apply 1 patch topically at bedtime. APPLY TO LOWER BACK AND REMOVE IN THE MORNING  . loratadine (CLARITIN) 10 MG tablet Take 10 mg by mouth daily as needed for allergies.  . memantine (NAMENDA XR) 28 MG CP24 24 hr capsule Take 28 mg by mouth.  . nebivolol (BYSTOLIC) 2.5 MG tablet Take 2.5 mg by mouth daily.  . Nutritional Supplements (BENECALORIE PO) Take 1.5 oz by mouth 2 (two) times daily.   . ondansetron (ZOFRAN) 4 MG tablet Take  1 tablet (4 mg total) by mouth every 6 (six) hours as needed for nausea.  . polyethylene glycol (MIRALAX / GLYCOLAX) packet Take 17 g by mouth every other day. For constipation and hold for loose stools  . polyvinyl alcohol (LIQUIFILM TEARS) 1.4 % ophthalmic solution Place 1 drop into both eyes 4 (four) times daily.   Marland Kitchen warfarin (COUMADIN) 3 MG tablet Take 3 mg by mouth daily.  . [DISCONTINUED] warfarin (COUMADIN) 2 MG tablet Take 2 mg by mouth daily.   No facility-administered encounter medications on file as of 06/09/2017.    ROS was provided with assistance of staff Review of Systems  Constitutional: Negative for activity change, appetite change, chills, diaphoresis, fatigue and fever.  HENT: Positive for hearing loss. Negative for congestion, trouble swallowing and voice change.   Respiratory: Negative for cough, shortness of breath and wheezing.   Cardiovascular: Negative for chest pain, palpitations and leg swelling.  Gastrointestinal: Negative for abdominal distention, abdominal pain, constipation, diarrhea, nausea  and vomiting.  Genitourinary: Negative for difficulty urinating, dysuria and urgency.  Musculoskeletal: Positive for gait problem.  Skin: Negative for color change and pallor.  Neurological: Negative for speech difficulty, weakness and headaches.       Dementia  Psychiatric/Behavioral: Positive for agitation, behavioral problems and confusion. Negative for hallucinations and sleep disturbance. The patient is nervous/anxious.     Immunization History  Administered Date(s) Administered  . DTaP 10/12/2012  . Influenza-Unspecified 11/22/2012, 12/26/2013, 11/12/2014, 12/09/2015, 12/14/2016  . PPD Test 12/06/2013  . Pneumococcal Conjugate-13 11/05/2016  . Pneumococcal Polysaccharide-23 09/22/2000  . Td 07/08/2005  . Tdap 11/10/2013   Pertinent  Health Maintenance Due  Topic Date Due  . INFLUENZA VACCINE  09/22/2017  . DEXA SCAN  Completed  . PNA vac Low Risk Adult   Completed   Fall Risk  10/22/2016 01/13/2015 06/28/2013  Falls in the past year? No No No  Risk for fall due to : - Impaired balance/gait -   Functional Status Survey:    Vitals:   06/09/17 1517  BP: 140/70  Pulse: 68  Resp: 20  Temp: 98 F (36.7 C)  Weight: 111 lb 3.2 oz (50.4 kg)  Height: 5' (1.524 m)   Body mass index is 21.72 kg/m. Physical Exam  Constitutional: She appears well-developed and well-nourished.  HENT:  Head: Normocephalic and atraumatic.  Eyes: Pupils are equal, round, and reactive to light. EOM are normal.  Neck: Neck supple. No thyromegaly present.  Cardiovascular: Normal rate.  Murmur heard. Afib  Pulmonary/Chest: She has no wheezes. She has no rales.  Abdominal: Soft. Bowel sounds are normal. She exhibits no distension. There is no tenderness.  Musculoskeletal: She exhibits no edema.  One person transfer, ambulates with walker with SBA for a short distance, w/c to go further.   Neurological: She is alert. She exhibits normal muscle tone. Coordination normal.  Oriented to self and her room on unit.   Skin: Skin is warm and dry.  Psychiatric: She has a normal mood and affect. Her behavior is normal.    Labs reviewed: Recent Labs    10/31/16 0236 11/01/16 0353 11/02/16 0445 11/11/16 01/04/17 05/19/17  NA 140 140 139 142 141 142  K 3.7 3.8 3.9 3.6 3.8 4.1  CL 112* 113* 112*  --   --   --   CO2 21* 21* 20*  --   --   --   GLUCOSE 108* 149* 110*  --   --   --   BUN 27* 32* 24* 19 28* 39*  CREATININE 0.90 0.90 0.80 0.8 0.8 1.2*  CALCIUM 8.2* 8.3* 8.3*  --   --   --    Recent Labs    08/31/16 10/28/16 0329 01/04/17 05/19/17  AST 19  --  17 16  ALT 12  --  10 8  ALKPHOS 70  --  75 70  ALBUMIN  --  3.0*  --   --    Recent Labs    10/27/16 1715  11/01/16 0353 11/02/16 0445 11/03/16 0318 11/11/16 01/04/17 05/19/17  WBC 9.6   < > 6.0 7.4 8.4 6.4 5.5 6.2  NEUTROABS 8.4*  --   --   --   --   --   --   --   HGB 12.4   < > 10.0* 9.5* 10.3* 9.8*  11.3* 9.8*  HCT 39.1   < > 31.2* 29.8* 32.6* 29* 33* 30*  MCV 94.7   < > 94.8 96.1 95.0  --   --   --  PLT 174   < > 179 163 197 419* 254 242   < > = values in this interval not displayed.   Lab Results  Component Value Date   TSH 1.32 05/19/2017   No results found for: HGBA1C No results found for: CHOL, HDL, LDLCALC, LDLDIRECT, TRIG, CHOLHDL  Significant Diagnostic Results in last 30 days:  No results found.  Assessment/Plan Depression with anxiety Positive responses since 05/04/17 Lexapro 5mg  qd, 05/17/17 Lexapro 10mg , prn Lorazepam 0.5mg  tid prn used almost daily except the last 2 days. Will continue prn Lorazepam 0.5 mg tid with at least 2 hours apart. Observe.   Senile dementia She has history of dementia, resides in SNF [facility], one person transfer, w/c for mobility, ambulates with walker for a short distance with SBA assist, continue Memantine 28mg  for memory   Anemia of chronic disease 05/19/17 Hgb 9.8, negative FOBT, no sign of bleed, continue to observe. Her baseline Hgb 10-11. Update CBC, TIBC, ferritin, Fe, FeSat, Vit B12, Folate.   CKD (chronic kidney disease) stage 3, GFR 30-59 ml/min Creat has trended up 1.2 05/19/17 from 0.8 01/04/17. Update CMP     Family/ staff Communication: plan of care reviewed with the patient and charge nurse.   Labs/tests ordered: CBC, TIBC, ferritin, Fe, FeSat, Vit B12, Folat, CMP   Time spend 25 minutes.

## 2017-06-09 NOTE — Assessment & Plan Note (Signed)
Positive responses since 05/04/17 Lexapro 5mg  qd, 05/17/17 Lexapro 10mg , prn Lorazepam 0.5mg  tid prn used almost daily except the last 2 days. Will continue prn Lorazepam 0.5 mg tid with at least 2 hours apart. Observe.

## 2017-06-09 NOTE — Assessment & Plan Note (Signed)
She has history of dementia, resides in SNF [facility], one person transfer, w/c for mobility, ambulates with walker for a short distance with SBA assist, continue Memantine 28mg  for memory

## 2017-06-14 DIAGNOSIS — N183 Chronic kidney disease, stage 3 (moderate): Secondary | ICD-10-CM | POA: Diagnosis not present

## 2017-06-14 DIAGNOSIS — D638 Anemia in other chronic diseases classified elsewhere: Secondary | ICD-10-CM | POA: Diagnosis not present

## 2017-06-15 LAB — CBC AND DIFFERENTIAL
HCT: 32 — AB (ref 36–46)
HEMOGLOBIN: 10.7 — AB (ref 12.0–16.0)
PLATELETS: 229 (ref 150–399)
WBC: 3.7

## 2017-06-15 LAB — IRON,TIBC AND FERRITIN PANEL: Ferritin: 194

## 2017-06-15 LAB — HEPATIC FUNCTION PANEL
ALT: 8 (ref 7–35)
AST: 16 (ref 13–35)
Alkaline Phosphatase: 76 (ref 25–125)
Bilirubin, Total: 0.3

## 2017-06-15 LAB — VITAMIN B12: VITAMIN B 12: 235

## 2017-06-15 LAB — BASIC METABOLIC PANEL
BUN: 32 — AB (ref 4–21)
Creatinine: 1.1 (ref <=1.1)
GLUCOSE: 83
Potassium: 4.7 (ref 3.4–5.3)
Sodium: 142 (ref 137–147)

## 2017-06-16 ENCOUNTER — Encounter: Payer: Self-pay | Admitting: Internal Medicine

## 2017-06-16 ENCOUNTER — Other Ambulatory Visit: Payer: Self-pay | Admitting: *Deleted

## 2017-06-16 ENCOUNTER — Non-Acute Institutional Stay (SKILLED_NURSING_FACILITY): Payer: PPO | Admitting: Internal Medicine

## 2017-06-16 DIAGNOSIS — E038 Other specified hypothyroidism: Secondary | ICD-10-CM

## 2017-06-16 DIAGNOSIS — F339 Major depressive disorder, recurrent, unspecified: Secondary | ICD-10-CM | POA: Diagnosis not present

## 2017-06-16 DIAGNOSIS — I480 Paroxysmal atrial fibrillation: Secondary | ICD-10-CM

## 2017-06-16 DIAGNOSIS — E063 Autoimmune thyroiditis: Secondary | ICD-10-CM

## 2017-06-16 DIAGNOSIS — F03918 Unspecified dementia, unspecified severity, with other behavioral disturbance: Secondary | ICD-10-CM

## 2017-06-16 DIAGNOSIS — F418 Other specified anxiety disorders: Secondary | ICD-10-CM | POA: Insufficient documentation

## 2017-06-16 DIAGNOSIS — F0391 Unspecified dementia with behavioral disturbance: Secondary | ICD-10-CM | POA: Diagnosis not present

## 2017-06-16 DIAGNOSIS — Z7901 Long term (current) use of anticoagulants: Secondary | ICD-10-CM | POA: Diagnosis not present

## 2017-06-16 LAB — CHG ASSAY BLOOD CARBON DIOXIDE: CO2: 29

## 2017-06-16 LAB — FOLATE: Folate: 6.1

## 2017-06-16 LAB — IRON, TOTAL/TOTAL IRON BINDING CAP
IRON, TOTAL/TOTAL IRON BINDING CAP: 75
Iron, Total/Total Iron Binding CAP: 235

## 2017-06-16 NOTE — Progress Notes (Signed)
Location:  [place] Room Number: 62 Place of Service:  SNF [phone]) Provider:  Blanchie Serve MD  Blanchie Serve, MD  Patient Care Team: Blanchie Serve, MD as PCP - General (Internal Medicine) Mast, Man X, NP as Nurse Practitioner (Nurse Practitioner) Wilford Corner, MD as Consulting Physician (Gastroenterology) Sanda Klein, MD as Consulting Physician (Cardiology)  Extended Emergency Contact Information Primary Emergency Contact: [name],Pat Address: [place] [address] [name] of [place] Phone: [phone] Mobile Phone: [phone] Relation: Daughter  Code Status:  DNR  Goals of care: Advanced Directive information Advanced Directives 06/09/2017  Does Patient Have a Medical Advance Directive? Yes  Type of Paramedic of [place];Out of facility DNR (pink MOST or yellow form)  Does patient want to make changes to medical advance directive? No - Patient declined  Copy of [place] in Chart? Yes  Pre-existing out of facility DNR order (yellow form or pink MOST form) Yellow form placed in chart (order not valid for inpatient use)     Chief Complaint  Patient presents with  . Medical Management of Chronic Issues    routine follow up    HPI:  Pt is a 84 y.o. female seen today for medical management of chronic diseases.  She does not participate in HPI and ROS. No fall reported. She is under total care.   Dementia- advanced, has behavioral issues with episodes of agitation, takes memantine 28 mg daily  hypothyroidism- taking levothyroxine 25 mcg daily  afib- Takes nebivolol 2.5 mg daily and diltiazem 120 mg daily, also on warfarin 3 mg daily  Depression- some behavioral outburst persists but overall stable, taking escitalopram 10 mg daily   Past Medical History:  Diagnosis Date  . Anemia, unspecified   . Anorexia   . Anxiety   . Arthritis   . Atherosclerotic  cerebrovascular disease   . Backache, unspecified   . Cataract   . Cholelithiasis   . Chronic kidney disease, unspecified   . Closed fracture of right inferior pubic ramus ([place]) 11/12/2013   11/10/13 s/p fall, ED eval  X-ray R hip  1. Possible nondisplaced right inferior pubic ramus fracture.  2. No femur fracture or dislocation   02/06/14 dc prn Motrin and Norco-not used    . Depression   . Diaphragmatic hernia without mention of obstruction or gangrene   . Diseases of lips 03/26/2013  . Diverticulosis of colon (without mention of hemorrhage)   . Hemorrhage of rectum and anus 03/26/2013  . Hyperlipidemia   . Hypertension   . Insomnia, unspecified   . Laceration of occipital scalp 11/12/2013  . Macular degeneration 06/28/2013  . Macular degeneration (senile) of retina, unspecified   . Osteoporosis   . Other malaise and fatigue   . Other sleep disturbances   . Other specified cardiac dysrhythmias(427.89)   . Other specified personal history presenting hazards to health(V15.89)   . Other symptoms involving cardiovascular system   . Pancreatitis   . Personal history of other diseases of circulatory system   . Personal history of other diseases of digestive system    pancreatitis from gallstones  . Personal history of other diseases of respiratory system   . Personal history of traumatic fracture   . Rosacea   . Senile osteoporosis    with old T11 fracture  . Thyroid disease   . Unspecified disorders of arteries and arterioles   .  Unspecified hearing loss   . Unspecified hemorrhoids without mention of complication    Past Surgical History:  Procedure Laterality Date  . ABDOMINAL HYSTERECTOMY     and BSO fibroids  . COLONOSCOPY  05/23/2007  . EYE LID LIFT Right 09/2016   OD  . HIP ARTHROPLASTY Left 10/31/2016   Procedure: ARTHROPLASTY  HIP (HEMIARTHROPLASTY);  Surgeon: Paralee Cancel, MD;  Location: [place];  Service: Orthopedics;  Laterality: Left;  . JOINT REPLACEMENT  1996  . TOTAL HIP  ARTHROPLASTY Right     Allergies  Allergen Reactions  . Ace Inhibitors Cough  . Mirtazapine Other (See Comments)    Nightmares   . Penicillins Rash    Has patient had a PCN reaction causing immediate rash, facial/tongue/throat swelling, SOB or lightheadedness with hypotension: Yes Has patient had a PCN reaction causing severe rash involving mucus membranes or skin necrosis: Unknown Has patient had a PCN reaction that required hospitalization: Unknown Has patient had a PCN reaction occurring within the last 10 years: Unknown If all of the above answers are "NO", then may proceed with Cephalosporin use.     Outpatient Encounter Medications as of 06/16/2017  Medication Sig  . acetaminophen (TYLENOL) 500 MG tablet Take 1,000 mg by mouth every 8 (eight) hours as needed for mild pain.  Marland Kitchen alum & mag hydroxide-simeth (MAALOX/MYLANTA) 200-200-20 MG/5ML suspension Take 30 mLs by mouth every 4 (four) hours as needed for indigestion.  . cholecalciferol 2000 units TABS Take 1 tablet (2,000 Units total) by mouth daily.  . clindamycin (CLEOCIN) 150 MG capsule Take 600 mg by mouth See admin instructions. ONE HOUR PRIOR TO DENTAL APPOINTMENTS  . Dextromethorphan-Guaifenesin (ROBAFEN DM) 10-100 MG/5ML liquid Take 10 mLs by mouth every 6 (six) hours as needed.  . diltiazem (CARDIZEM CD) 120 MG 24 hr capsule Take 120 mg by mouth daily.  Marland Kitchen escitalopram (LEXAPRO) 10 MG tablet Take 10 mg by mouth daily.  . hydrocortisone cream 1 % Apply 1 application topically 2 (two) times daily. TO HEMORRHOIDS  . levothyroxine (SYNTHROID, LEVOTHROID) 25 MCG tablet Take 25 mcg by mouth daily before breakfast.   . Lidocaine (ASPERCREME LIDOCAINE) 4 % PTCH Apply 1 patch topically at bedtime. APPLY TO LOWER BACK AND REMOVE IN THE MORNING  . loratadine (CLARITIN) 10 MG tablet Take 10 mg by mouth daily as needed for allergies.  . memantine (NAMENDA XR) 28 MG CP24 24 hr capsule Take 28 mg by mouth.  . nebivolol (BYSTOLIC) 2.5 MG  tablet Take 2.5 mg by mouth daily.  . Nutritional Supplements (BENECALORIE PO) Take 1.5 oz by mouth 2 (two) times daily.   . ondansetron (ZOFRAN) 4 MG tablet Take 1 tablet (4 mg total) by mouth every 6 (six) hours as needed for nausea.  . polyethylene glycol (MIRALAX / GLYCOLAX) packet Take 17 g by mouth every other day. For constipation and hold for loose stools  . polyvinyl alcohol (LIQUIFILM TEARS) 1.4 % ophthalmic solution Place 1 drop into both eyes 4 (four) times daily.   Marland Kitchen warfarin (COUMADIN) 3 MG tablet Take 3 mg by mouth daily.   No facility-administered encounter medications on file as of 06/16/2017.     Review of Systems  Unable to perform ROS: Dementia    Immunization History  Administered Date(s) Administered  . DTaP 10/12/2012  . Influenza-Unspecified 11/22/2012, 12/26/2013, 11/12/2014, 12/09/2015, 12/14/2016  . PPD Test 12/06/2013  . Pneumococcal Conjugate-13 11/05/2016  . Pneumococcal Polysaccharide-23 09/22/2000  . Td 07/08/2005  . Tdap 11/10/2013  Pertinent  Health Maintenance Due  Topic Date Due  . INFLUENZA VACCINE  09/22/2017  . DEXA SCAN  Completed  . PNA vac Low Risk Adult  Completed   Fall Risk  10/22/2016 01/13/2015 06/28/2013  Falls in the past year? No No No  Risk for fall due to : - Impaired balance/gait -   Functional Status Survey:    Vitals:   06/16/17 1315  BP: 130/70  Pulse: 70  Resp: 20  Temp: 98 F (36.7 C)  SpO2: 95%  Weight: 111 lb 3.2 oz (50.4 kg)  Height: 5' (1.524 m)   Body mass index is 21.72 kg/m.   Wt Readings from Last 3 Encounters:  06/16/17 111 lb 3.2 oz (50.4 kg)  06/09/17 111 lb 3.2 oz (50.4 kg)  05/17/17 111 lb 4.8 oz (50.5 kg)   Physical Exam  Constitutional:  Thin built, frail, elderly female in no acute distress  HENT:  Head: Normocephalic and atraumatic.  Right Ear: External ear normal.  Left Ear: External ear normal.  Nose: Nose normal.  Mouth/Throat: Oropharynx is clear and moist. No oropharyngeal  exudate.  Eyes: Pupils are equal, round, and reactive to light. Conjunctivae and EOM are normal. Right eye exhibits no discharge. Left eye exhibits no discharge.  Ectropion to left lower eye lid  Neck: Normal range of motion. Neck supple.  Cardiovascular: Normal rate and regular rhythm.  Pulmonary/Chest: Effort normal and breath sounds normal. She has no wheezes. She has no rales.  Abdominal: Soft. Bowel sounds are normal. There is no tenderness. There is no guarding.  Musculoskeletal: She exhibits deformity. She exhibits no edema.  Able to move all 4 extremities, unsteady gait, assistance with transfer  Lymphadenopathy:    She has no cervical adenopathy.  Neurological:  Pleasantly confused  Skin: Skin is warm and dry. She is not diaphoretic.    Labs reviewed: Recent Labs    10/31/16 0236 11/01/16 0353 11/02/16 0445  01/04/17 05/19/17 06/15/17 06/16/17 0819  NA 140 140 139   < > 141 142 142  --   K 3.7 3.8 3.9   < > 3.8 4.1 4.7  --   CL 112* 113* 112*  --   --   --   --   --   CO2 21* 21* 20*  --   --   --   --  29  GLUCOSE 108* 149* 110*  --   --   --   --   --   BUN 27* 32* 24*   < > 28* 39* 32*  --   CREATININE 0.90 0.90 0.80   < > 0.8 1.2* 1.1  --   CALCIUM 8.2* 8.3* 8.3*  --   --   --   --   --    < > = values in this interval not displayed.   Recent Labs    10/28/16 0329 01/04/17 05/19/17 06/15/17  AST  --  17 16 16   ALT  --  10 8 8   ALKPHOS  --  75 70 76  ALBUMIN 3.0*  --   --   --    Recent Labs    10/27/16 1715  11/01/16 0353 11/02/16 0445 11/03/16 0318  01/04/17 05/19/17 06/15/17  WBC 9.6   < > 6.0 7.4 8.4   < > 5.5 6.2 3.7  NEUTROABS 8.4*  --   --   --   --   --   --   --   --  HGB 12.4   < > 10.0* 9.5* 10.3*   < > 11.3* 9.8* 10.7*  HCT 39.1   < > 31.2* 29.8* 32.6*   < > 33* 30* 32*  MCV 94.7   < > 94.8 96.1 95.0  --   --   --   --   PLT 174   < > 179 163 197   < > 254 242 229   < > = values in this interval not displayed.   Lab Results  Component  Value Date   TSH 1.32 05/19/2017   No results found for: HGBA1C No results found for: CHOL, HDL, LDLCALC, LDLDIRECT, TRIG, CHOLHDL  Significant Diagnostic Results in last 30 days:  No results found.  Assessment/Plan  1. Paroxysmal atrial fibrillation (HCC) Continue diltiazem and nebivolol with warfarin, goal inr 2-3  2. Hypothyroidism due to Hashimoto's thyroiditis Continue levothyroxine and monitor, stable TSH  3. Senile dementia with behavioral disturbance Supportive care, continue memantine  4. Depression, recurrent ([place]) Continue escitalopram, supportive care    Family/ staff Communication: reviewed care plan with patient and charge nurse.    Labs/tests ordered:  none   Blanchie Serve, MD Internal Medicine [facility] [address] [place], [place] [address] Cell Phone (Monday-Friday 8 am - 5 pm): [phone] On Call: [phone] and follow prompts after 5 pm and on weekends Office Phone: [phone] Office Fax: [phone]

## 2017-06-21 ENCOUNTER — Other Ambulatory Visit: Payer: Self-pay | Admitting: *Deleted

## 2017-06-21 DIAGNOSIS — I481 Persistent atrial fibrillation: Secondary | ICD-10-CM | POA: Diagnosis not present

## 2017-06-21 LAB — POCT INR: INR: 2.3 — AB (ref <=1.1)

## 2017-06-21 LAB — PROTIME-INR: PROTIME: 24.5 — AB (ref 10.0–13.8)

## 2017-06-27 ENCOUNTER — Encounter: Payer: Self-pay | Admitting: Nurse Practitioner

## 2017-06-27 ENCOUNTER — Non-Acute Institutional Stay (SKILLED_NURSING_FACILITY): Payer: PPO | Admitting: Nurse Practitioner

## 2017-06-27 DIAGNOSIS — F418 Other specified anxiety disorders: Secondary | ICD-10-CM | POA: Diagnosis not present

## 2017-06-27 DIAGNOSIS — F0391 Unspecified dementia with behavioral disturbance: Secondary | ICD-10-CM

## 2017-06-27 DIAGNOSIS — F03918 Unspecified dementia, unspecified severity, with other behavioral disturbance: Secondary | ICD-10-CM

## 2017-06-27 NOTE — Assessment & Plan Note (Signed)
persisted anxiety/agitation, prn Ativan 0.5mg  tid used at least x1 per day almost daily in the past 14 days. Hx of depression, taking Escitalopram 10mg  daily, increase Escitalopram 15mg /10mg  po daily, will have Lorazepam 0.5mg  prn bid with at least 2 hours apart between doses available to her. Observe.

## 2017-06-27 NOTE — Progress Notes (Signed)
Location:  [place] Room Number: 88 Place of Service:  SNF (31) Provider:  Pantelis Elgersma, ManXie  NP  Blanchie Serve, MD  Patient Care Team: Blanchie Serve, MD as PCP - General (Internal Medicine) Chaun Uemura X, NP as Nurse Practitioner (Nurse Practitioner) Wilford Corner, MD as Consulting Physician (Gastroenterology) Sanda Klein, MD as Consulting Physician (Cardiology)  Extended Emergency Contact Information Primary Emergency Contact: [name],Pat Address: [place] [address] [name] of [place] Phone: [phone] Mobile Phone: [phone] Relation: Daughter  Code Status:  DNR Goals of care: Advanced Directive information Advanced Directives 06/27/2017  Does Patient Have a Medical Advance Directive? Yes  Type of Paramedic of [place];Out of facility DNR (pink MOST or yellow form)  Does patient want to make changes to medical advance directive? No - Patient declined  Copy of [place] in Chart? Yes  Pre-existing out of facility DNR order (yellow form or pink MOST form) Yellow form placed in chart (order not valid for inpatient use)     Chief Complaint  Patient presents with  . Acute Visit    14 day re-eval- Ativan, (R) foot babytoe nail fell off    HPI:  Pt is a 84 y.o. female seen today for an acute visit for persisted anxiety/agitation, prn Ativan 0.5mg  tid used at least x1 per day almost daily in the past 14 days. Hx of depression, taking Escitalopram 10mg  daily,  Dementia, resides in SNF, [facility], w/c for mobility, on Memantine for memory.    Past Medical History:  Diagnosis Date  . Anemia, unspecified   . Anorexia   . Anxiety   . Arthritis   . Atherosclerotic cerebrovascular disease   . Backache, unspecified   . Cataract   . Cholelithiasis   . Chronic kidney disease, unspecified   . Closed fracture of right inferior pubic ramus ([place]) 11/12/2013   11/10/13 s/p fall, ED  eval  X-ray R hip  1. Possible nondisplaced right inferior pubic ramus fracture.  2. No femur fracture or dislocation   02/06/14 dc prn Motrin and Norco-not used    . Depression   . Diaphragmatic hernia without mention of obstruction or gangrene   . Diseases of lips 03/26/2013  . Diverticulosis of colon (without mention of hemorrhage)   . Hemorrhage of rectum and anus 03/26/2013  . Hyperlipidemia   . Hypertension   . Insomnia, unspecified   . Laceration of occipital scalp 11/12/2013  . Macular degeneration 06/28/2013  . Macular degeneration (senile) of retina, unspecified   . Osteoporosis   . Other malaise and fatigue   . Other sleep disturbances   . Other specified cardiac dysrhythmias(427.89)   . Other specified personal history presenting hazards to health(V15.89)   . Other symptoms involving cardiovascular system   . Pancreatitis   . Personal history of other diseases of circulatory system   . Personal history of other diseases of digestive system    pancreatitis from gallstones  . Personal history of other diseases of respiratory system   . Personal history of traumatic fracture   . Rosacea   . Senile osteoporosis    with old T11 fracture  . Thyroid disease   . Unspecified disorders of arteries and arterioles   . Unspecified hearing loss   . Unspecified hemorrhoids without mention of complication    Past Surgical History:  Procedure Laterality Date  . ABDOMINAL HYSTERECTOMY  and BSO fibroids  . COLONOSCOPY  05/23/2007  . EYE LID LIFT Right 09/2016   OD  . HIP ARTHROPLASTY Left 10/31/2016   Procedure: ARTHROPLASTY  HIP (HEMIARTHROPLASTY);  Surgeon: Paralee Cancel, MD;  Location: [place];  Service: Orthopedics;  Laterality: Left;  . JOINT REPLACEMENT  1996  . TOTAL HIP ARTHROPLASTY Right     Allergies  Allergen Reactions  . Ace Inhibitors Cough  . Mirtazapine Other (See Comments)    Nightmares   . Penicillins Rash    Has patient had a PCN reaction causing immediate rash,  facial/tongue/throat swelling, SOB or lightheadedness with hypotension: Yes Has patient had a PCN reaction causing severe rash involving mucus membranes or skin necrosis: Unknown Has patient had a PCN reaction that required hospitalization: Unknown Has patient had a PCN reaction occurring within the last 10 years: Unknown If all of the above answers are "NO", then may proceed with Cephalosporin use.     Outpatient Encounter Medications as of 06/27/2017  Medication Sig  . acetaminophen (TYLENOL) 500 MG tablet Take 1,000 mg by mouth every 8 (eight) hours as needed for mild pain.  Marland Kitchen alum & mag hydroxide-simeth (MAALOX/MYLANTA) 200-200-20 MG/5ML suspension Take 30 mLs by mouth every 4 (four) hours as needed for indigestion.  . cholecalciferol 2000 units TABS Take 1 tablet (2,000 Units total) by mouth daily.  . clindamycin (CLEOCIN) 150 MG capsule Take 600 mg by mouth See admin instructions. ONE HOUR PRIOR TO DENTAL APPOINTMENTS  . Dextromethorphan-Guaifenesin (ROBAFEN DM) 10-100 MG/5ML liquid Take 10 mLs by mouth every 6 (six) hours as needed.  . diltiazem (CARDIZEM CD) 120 MG 24 hr capsule Take 120 mg by mouth daily.  Marland Kitchen escitalopram (LEXAPRO) 10 MG tablet Take 10 mg by mouth daily.  . hydrocortisone cream 1 % Apply 1 application topically 2 (two) times daily. TO HEMORRHOIDS  . levothyroxine (SYNTHROID, LEVOTHROID) 25 MCG tablet Take 25 mcg by mouth daily before breakfast.   . Lidocaine (ASPERCREME LIDOCAINE) 4 % PTCH Apply 1 patch topically at bedtime. APPLY TO LOWER BACK AND REMOVE IN THE MORNING  . loratadine (CLARITIN) 10 MG tablet Take 10 mg by mouth daily as needed for allergies.  . memantine (NAMENDA XR) 28 MG CP24 24 hr capsule Take 28 mg by mouth.  . nebivolol (BYSTOLIC) 2.5 MG tablet Take 2.5 mg by mouth daily.  . Nutritional Supplements (BENECALORIE PO) Take 1.5 oz by mouth 2 (two) times daily.   . ondansetron (ZOFRAN) 4 MG tablet Take 1 tablet (4 mg total) by mouth every 6 (six) hours  as needed for nausea.  . polyethylene glycol (MIRALAX / GLYCOLAX) packet Take 17 g by mouth every other day. For constipation and hold for loose stools  . polyvinyl alcohol (LIQUIFILM TEARS) 1.4 % ophthalmic solution 1 drop as needed for dry eyes.  Marland Kitchen warfarin (COUMADIN) 2 MG tablet Take 2 mg by mouth. Alternate 2 mg and 3 mg every other day  . warfarin (COUMADIN) 3 MG tablet Take 3 mg by mouth daily.  . [DISCONTINUED] polyvinyl alcohol (LIQUIFILM TEARS) 1.4 % ophthalmic solution Place 1 drop into both eyes 4 (four) times daily.    No facility-administered encounter medications on file as of 06/27/2017.    ROS was provided with assistance of staff Review of Systems  Constitutional: Negative for activity change, appetite change, chills, diaphoresis, fatigue and fever.  HENT: Positive for hearing loss. Negative for congestion.   Respiratory: Negative for cough and shortness of breath.   Cardiovascular: Negative for  chest pain, palpitations and leg swelling.  Gastrointestinal: Negative for abdominal distention and abdominal pain.  Genitourinary: Negative for difficulty urinating, dysuria and urgency.  Musculoskeletal: Positive for gait problem.  Skin: Negative for color change.  Neurological: Negative for speech difficulty, weakness and headaches.       Dementia  Psychiatric/Behavioral: Positive for agitation, behavioral problems and confusion. Negative for hallucinations and sleep disturbance. The patient is nervous/anxious.     Immunization History  Administered Date(s) Administered  . DTaP 10/12/2012  . Influenza-Unspecified 11/22/2012, 12/26/2013, 11/12/2014, 12/09/2015, 12/14/2016  . PPD Test 12/06/2013  . Pneumococcal Conjugate-13 11/05/2016  . Pneumococcal Polysaccharide-23 09/22/2000  . Td 07/08/2005  . Tdap 11/10/2013   Pertinent  Health Maintenance Due  Topic Date Due  . INFLUENZA VACCINE  09/22/2017  . DEXA SCAN  Completed  . PNA vac Low Risk Adult  Completed   Fall Risk   10/22/2016 01/13/2015 06/28/2013  Falls in the past year? No No No  Risk for fall due to : - Impaired balance/gait -   Functional Status Survey:    Vitals:   06/27/17 1116  BP: (!) 122/58  Pulse: 60  Resp: 20  Temp: 98.4 F (36.9 C)  SpO2: 96%  Weight: 111 lb 8 oz (50.6 kg)  Height: 5' (1.524 m)   Body mass index is 21.78 kg/m. Physical Exam  Constitutional: She appears well-developed and well-nourished. No distress.  HENT:  Head: Normocephalic and atraumatic.  Eyes: Pupils are equal, round, and reactive to light. EOM are normal.  Neck: Normal range of motion. Neck supple. No JVD present.  Cardiovascular: Normal rate.  Murmur heard. Irregular heart beats  Pulmonary/Chest: She has no wheezes. She has no rales.  Abdominal: Soft. Bowel sounds are normal.  Musculoskeletal: She exhibits no edema.  One person transfer, w/c to get around.   Neurological: She is alert. No cranial nerve deficit. She exhibits normal muscle tone. Coordination normal.  Oriented to self and her room on unit.   Skin: Skin is warm and dry. She is not diaphoretic.  The right 5th toe nail traumatic fall off 06/24/17, mild inflamed nail bed, no sign of infection.   Psychiatric: She has a normal mood and affect.    Labs reviewed: Recent Labs    10/31/16 0236 11/01/16 0353 11/02/16 0445  01/04/17 05/19/17 06/15/17 06/16/17 0819  NA 140 140 139   < > 141 142 142  --   K 3.7 3.8 3.9   < > 3.8 4.1 4.7  --   CL 112* 113* 112*  --   --   --   --   --   CO2 21* 21* 20*  --   --   --   --  29  GLUCOSE 108* 149* 110*  --   --   --   --   --   BUN 27* 32* 24*   < > 28* 39* 32*  --   CREATININE 0.90 0.90 0.80   < > 0.8 1.2* 1.1  --   CALCIUM 8.2* 8.3* 8.3*  --   --   --   --   --    < > = values in this interval not displayed.   Recent Labs    10/28/16 0329 01/04/17 05/19/17 06/15/17  AST  --  17 16 16   ALT  --  10 8 8   ALKPHOS  --  75 70 76  ALBUMIN 3.0*  --   --   --  Recent Labs    10/27/16 1715   11/01/16 0353 11/02/16 0445 11/03/16 0318  01/04/17 05/19/17 06/15/17  WBC 9.6   < > 6.0 7.4 8.4   < > 5.5 6.2 3.7  NEUTROABS 8.4*  --   --   --   --   --   --   --   --   HGB 12.4   < > 10.0* 9.5* 10.3*   < > 11.3* 9.8* 10.7*  HCT 39.1   < > 31.2* 29.8* 32.6*   < > 33* 30* 32*  MCV 94.7   < > 94.8 96.1 95.0  --   --   --   --   PLT 174   < > 179 163 197   < > 254 242 229   < > = values in this interval not displayed.   Lab Results  Component Value Date   TSH 1.32 05/19/2017   No results found for: HGBA1C No results found for: CHOL, HDL, LDLCALC, LDLDIRECT, TRIG, CHOLHDL  Significant Diagnostic Results in last 30 days:  No results found.  Assessment/Plan Depression with anxiety persisted anxiety/agitation, prn Ativan 0.5mg  tid used at least x1 per day almost daily in the past 14 days. Hx of depression, taking Escitalopram 10mg  daily, increase Escitalopram 15mg /10mg  po daily, will have Lorazepam 0.5mg  prn bid with at least 2 hours apart between doses available to her. Observe.   Senile dementia Dementia, resides in SNF, [facility], w/c for mobility, continue MeMantine for memory.        Family/ staff Communication: plan of care reviewed with the patient and charge nurse.   Labs/tests ordered:  none  Time spend 25 minutes

## 2017-06-27 NOTE — Assessment & Plan Note (Signed)
Dementia, resides in SNF, [facility], w/c for mobility, continue MeMantine for memory.

## 2017-06-27 NOTE — Assessment & Plan Note (Deleted)
persisted anxiety/agitation, prn Ativan 0.5mg  tid used at least x1 per day almost daily in the past 14 days. Hx of depression, taking Escitalopram 10mg  daily, increase Escitalopram 15mg /10mg  po daily, will have Lorazepam 0.5mg  prn bid with at least 2 hours apart between doses available to her. Observe.

## 2017-07-05 DIAGNOSIS — I481 Persistent atrial fibrillation: Secondary | ICD-10-CM | POA: Diagnosis not present

## 2017-07-07 ENCOUNTER — Encounter: Payer: Self-pay | Admitting: Nurse Practitioner

## 2017-07-07 ENCOUNTER — Non-Acute Institutional Stay (SKILLED_NURSING_FACILITY): Payer: PPO | Admitting: Nurse Practitioner

## 2017-07-07 DIAGNOSIS — Z5181 Encounter for therapeutic drug level monitoring: Secondary | ICD-10-CM

## 2017-07-07 DIAGNOSIS — F418 Other specified anxiety disorders: Secondary | ICD-10-CM | POA: Diagnosis not present

## 2017-07-07 DIAGNOSIS — I482 Chronic atrial fibrillation: Secondary | ICD-10-CM | POA: Diagnosis not present

## 2017-07-07 DIAGNOSIS — F03918 Unspecified dementia, unspecified severity, with other behavioral disturbance: Secondary | ICD-10-CM

## 2017-07-07 DIAGNOSIS — E038 Other specified hypothyroidism: Secondary | ICD-10-CM

## 2017-07-07 DIAGNOSIS — E063 Autoimmune thyroiditis: Secondary | ICD-10-CM

## 2017-07-07 DIAGNOSIS — H02135 Senile ectropion of left lower eyelid: Secondary | ICD-10-CM | POA: Diagnosis not present

## 2017-07-07 DIAGNOSIS — F0391 Unspecified dementia with behavioral disturbance: Secondary | ICD-10-CM

## 2017-07-07 DIAGNOSIS — Z7901 Long term (current) use of anticoagulants: Secondary | ICD-10-CM | POA: Diagnosis not present

## 2017-07-07 NOTE — Assessment & Plan Note (Addendum)
depression, anxiety, angry, emotional outburst, improved on Escitalopram 15mg  qd, prn Lorazepam 0.5mg  used almost daily in the past 14 days. Will continue prn Lorazepam 0.5mg  bid x 14 days, increase Escitalopram to 20mg  po daily. Observe.

## 2017-07-07 NOTE — Assessment & Plan Note (Signed)
Hx of Hypothyroidism, continue Levothyroxine 42mcg daily, last TSH 1.32 05/19/17

## 2017-07-07 NOTE — Assessment & Plan Note (Signed)
The patient has history of dementia, resides in SNF [facility], one person transfer, w/c dependent, continue  Memantine to preserve memory.

## 2017-07-07 NOTE — Progress Notes (Addendum)
Location:  [place] Room Number: 47 Place of Service:  SNF (31) Provider:  Niti Leisure, ManXie  NP  Blanchie Serve, MD  Patient Care Team: Blanchie Serve, MD as PCP - General (Internal Medicine) Joal Eakle X, NP as Nurse Practitioner (Nurse Practitioner) Wilford Corner, MD as Consulting Physician (Gastroenterology) Sanda Klein, MD as Consulting Physician (Cardiology)  Extended Emergency Contact Information Primary Emergency Contact: [name],Pat Address: [place] [address] [name] of [place] Phone: [phone] Mobile Phone: [phone] Relation: Daughter  Code Status:  DNR Goals of care: Advanced Directive information Advanced Directives 06/27/2017  Does Patient Have a Medical Advance Directive? Yes  Type of Paramedic of [place];Out of facility DNR (pink MOST or yellow form)  Does patient want to make changes to medical advance directive? No - Patient declined  Copy of [place] in Chart? Yes  Pre-existing out of facility DNR order (yellow form or pink MOST form) Yellow form placed in chart (order not valid for inpatient use)     Chief Complaint  Patient presents with  . Acute Visit    14 day re- eval for Ativan    HPI:  Pt is a 84 y.o. female seen today for an acute visit for depression, anxiety, angry, emotional outburst, improved on Escitalopram 15mg  qd, prn Lorazepam 0.5mg  used almost daily in the past 14 days. The patient has history of dementia, resides in SNF [facility], one person transfer, w/c dependent, on Memantine to preserve memory. Hx of Hypothyroidism, on Levothyroxine 27mcg daily, last TSH 1.32 05/19/17   She has history of Afib, heart rate is in control, taking Coumadin for thromboembolic risk reduction, today's INR 1.4 sub therapeutic, her goal of INR 2-3. Currently on Coumadin 2mg  and 3mg  alternating dose.   Past Medical History:  Diagnosis Date  . Anemia,  unspecified   . Anorexia   . Anxiety   . Arthritis   . Atherosclerotic cerebrovascular disease   . Backache, unspecified   . Cataract   . Cholelithiasis   . Chronic kidney disease, unspecified   . Closed fracture of right inferior pubic ramus ([place]) 11/12/2013   11/10/13 s/p fall, ED eval  X-ray R hip  1. Possible nondisplaced right inferior pubic ramus fracture.  2. No femur fracture or dislocation   02/06/14 dc prn Motrin and Norco-not used    . Depression   . Diaphragmatic hernia without mention of obstruction or gangrene   . Diseases of lips 03/26/2013  . Diverticulosis of colon (without mention of hemorrhage)   . Hemorrhage of rectum and anus 03/26/2013  . Hyperlipidemia   . Hypertension   . Insomnia, unspecified   . Laceration of occipital scalp 11/12/2013  . Macular degeneration 06/28/2013  . Macular degeneration (senile) of retina, unspecified   . Osteoporosis   . Other malaise and fatigue   . Other sleep disturbances   . Other specified cardiac dysrhythmias(427.89)   . Other specified personal history presenting hazards to health(V15.89)   . Other symptoms involving cardiovascular system   . Pancreatitis   . Personal history of other diseases of circulatory system   . Personal history of other diseases of digestive system    pancreatitis from gallstones  . Personal history of other diseases of respiratory system   . Personal history of traumatic fracture   . Rosacea   . Senile osteoporosis    with old T11 fracture  .  Thyroid disease   . Unspecified disorders of arteries and arterioles   . Unspecified hearing loss   . Unspecified hemorrhoids without mention of complication    Past Surgical History:  Procedure Laterality Date  . ABDOMINAL HYSTERECTOMY     and BSO fibroids  . COLONOSCOPY  05/23/2007  . EYE LID LIFT Right 09/2016   OD  . HIP ARTHROPLASTY Left 10/31/2016   Procedure: ARTHROPLASTY  HIP (HEMIARTHROPLASTY);  Surgeon: Paralee Cancel, MD;  Location: [facility];   Service: Orthopedics;  Laterality: Left;  . JOINT REPLACEMENT  1996  . TOTAL HIP ARTHROPLASTY Right     Allergies  Allergen Reactions  . Ace Inhibitors Cough  . Mirtazapine Other (See Comments)    Nightmares   . Penicillins Rash    Has patient had a PCN reaction causing immediate rash, facial/tongue/throat swelling, SOB or lightheadedness with hypotension: Yes Has patient had a PCN reaction causing severe rash involving mucus membranes or skin necrosis: Unknown Has patient had a PCN reaction that required hospitalization: Unknown Has patient had a PCN reaction occurring within the last 10 years: Unknown If all of the above answers are "NO", then may proceed with Cephalosporin use.     Outpatient Encounter Medications as of 07/07/2017  Medication Sig  . acetaminophen (TYLENOL) 500 MG tablet Take 1,000 mg by mouth every 8 (eight) hours as needed for mild pain.  Marland Kitchen alum & mag hydroxide-simeth (MAALOX/MYLANTA) 200-200-20 MG/5ML suspension Take 30 mLs by mouth every 4 (four) hours as needed for indigestion.  . cholecalciferol 2000 units TABS Take 1 tablet (2,000 Units total) by mouth daily.  . clindamycin (CLEOCIN) 150 MG capsule Take 600 mg by mouth See admin instructions. ONE HOUR PRIOR TO DENTAL APPOINTMENTS  . Dextromethorphan-Guaifenesin (ROBAFEN DM) 10-100 MG/5ML liquid Take 10 mLs by mouth every 6 (six) hours as needed.  . diltiazem (CARDIZEM CD) 120 MG 24 hr capsule Take 120 mg by mouth daily.  Marland Kitchen escitalopram (LEXAPRO) 10 MG tablet Take 20 mg by mouth daily.   . hydrocortisone cream 1 % Apply 1 application topically 2 (two) times daily. TO HEMORRHOIDS  . levothyroxine (SYNTHROID, LEVOTHROID) 25 MCG tablet Take 25 mcg by mouth daily before breakfast.   . Lidocaine (ASPERCREME LIDOCAINE) 4 % PTCH Apply 1 patch topically at bedtime. APPLY TO LOWER BACK AND REMOVE IN THE MORNING  . loratadine (CLARITIN) 10 MG tablet Take 10 mg by mouth daily as needed for allergies.  Marland Kitchen LORazepam  (ATIVAN) 0.5 MG tablet Take 0.5 mg by mouth every 12 (twelve) hours as needed for anxiety.  . memantine (NAMENDA XR) 28 MG CP24 24 hr capsule Take 28 mg by mouth.  . nebivolol (BYSTOLIC) 2.5 MG tablet Take 2.5 mg by mouth daily.  . Nutritional Supplements (BENECALORIE PO) Take 1.5 oz by mouth 2 (two) times daily.   . ondansetron (ZOFRAN) 4 MG tablet Take 1 tablet (4 mg total) by mouth every 6 (six) hours as needed for nausea.  . polyethylene glycol (MIRALAX / GLYCOLAX) packet Take 17 g by mouth every other day. For constipation and hold for loose stools  . polyvinyl alcohol (LIQUIFILM TEARS) 1.4 % ophthalmic solution 1 drop as needed for dry eyes.  Marland Kitchen warfarin (COUMADIN) 2 MG tablet Take 2 mg by mouth. Alternate 2 mg and 3 mg every other day  . warfarin (COUMADIN) 3 MG tablet Take 3 mg by mouth daily.   No facility-administered encounter medications on file as of 07/07/2017.    ROS was provided with  assistance of staff Review of Systems  Constitutional: Negative for activity change, appetite change, chills, diaphoresis, fatigue and fever.  HENT: Positive for hearing loss. Negative for congestion.   Respiratory: Negative for cough, shortness of breath and wheezing.   Cardiovascular: Positive for leg swelling. Negative for chest pain and palpitations.  Musculoskeletal: Positive for gait problem.  Skin: Positive for color change and pallor.  Neurological: Negative for dizziness, speech difficulty, weakness and headaches.       Dementia  Psychiatric/Behavioral: Positive for agitation, behavioral problems and confusion. Negative for hallucinations and sleep disturbance. The patient is nervous/anxious.     Immunization History  Administered Date(s) Administered  . DTaP 10/12/2012  . Influenza-Unspecified 11/22/2012, 12/26/2013, 11/12/2014, 12/09/2015, 12/14/2016  . PPD Test 12/06/2013  . Pneumococcal Conjugate-13 11/05/2016  . Pneumococcal Polysaccharide-23 09/22/2000  . Td 07/08/2005  .  Tdap 11/10/2013   Pertinent  Health Maintenance Due  Topic Date Due  . INFLUENZA VACCINE  09/22/2017  . DEXA SCAN  Completed  . PNA vac Low Risk Adult  Completed   Fall Risk  10/22/2016 01/13/2015 06/28/2013  Falls in the past year? No No No  Risk for fall due to : - Impaired balance/gait -   Functional Status Survey:    Vitals:   07/07/17 1049  BP: 130/80  Pulse: 64  Resp: 20  Temp: 98.4 F (36.9 C)  Weight: 111 lb 9.6 oz (50.6 kg)  Height: 5' (1.524 m)   Body mass index is 21.8 kg/m. Physical Exam  Constitutional: She appears well-developed and well-nourished.  HENT:  Head: Normocephalic and atraumatic.  Eyes: Pupils are equal, round, and reactive to light. EOM are normal.  Neck: Normal range of motion. Neck supple. No JVD present. No thyromegaly present.  Cardiovascular: Normal rate.  Murmur heard. Irregular heart beats.   Pulmonary/Chest: She has no wheezes. She has no rales.  Musculoskeletal: She exhibits edema.  Trace edema in BLE. One person transfer, w/c to get around.   Neurological: She is alert. No cranial nerve deficit. She exhibits normal muscle tone. Coordination normal.  Oriented to person and her room on unit.   Skin: Skin is warm and dry.  Psychiatric:  Observed emotional outbursts, anxiety, angry    Labs reviewed: Recent Labs    10/31/16 0236 11/01/16 0353 11/02/16 0445  01/04/17 05/19/17 06/15/17 06/16/17 0819  NA 140 140 139   < > 141 142 142  --   K 3.7 3.8 3.9   < > 3.8 4.1 4.7  --   CL 112* 113* 112*  --   --   --   --   --   CO2 21* 21* 20*  --   --   --   --  29  GLUCOSE 108* 149* 110*  --   --   --   --   --   BUN 27* 32* 24*   < > 28* 39* 32*  --   CREATININE 0.90 0.90 0.80   < > 0.8 1.2* 1.1  --   CALCIUM 8.2* 8.3* 8.3*  --   --   --   --   --    < > = values in this interval not displayed.   Recent Labs    10/28/16 0329 01/04/17 05/19/17 06/15/17  AST  --  17 16 16   ALT  --  10 8 8   ALKPHOS  --  75 70 76  ALBUMIN 3.0*  --    --   --  Recent Labs    10/27/16 1715  11/01/16 0353 11/02/16 0445 11/03/16 0318  01/04/17 05/19/17 06/15/17  WBC 9.6   < > 6.0 7.4 8.4   < > 5.5 6.2 3.7  NEUTROABS 8.4*  --   --   --   --   --   --   --   --   HGB 12.4   < > 10.0* 9.5* 10.3*   < > 11.3* 9.8* 10.7*  HCT 39.1   < > 31.2* 29.8* 32.6*   < > 33* 30* 32*  MCV 94.7   < > 94.8 96.1 95.0  --   --   --   --   PLT 174   < > 179 163 197   < > 254 242 229   < > = values in this interval not displayed.   Lab Results  Component Value Date   TSH 1.32 05/19/2017   No results found for: HGBA1C No results found for: CHOL, HDL, LDLCALC, LDLDIRECT, TRIG, CHOLHDL  Significant Diagnostic Results in last 30 days:  No results found.  Assessment/Plan Depression with anxiety depression, anxiety, angry, emotional outburst, improved on Escitalopram 15mg  qd, prn Lorazepam 0.5mg  used almost daily in the past 14 days. Will continue prn Lorazepam 0.5mg  bid x 14 days, increase Escitalopram to 20mg  po daily. Observe.   Hypothyroidism Hx of Hypothyroidism, continue Levothyroxine 24mcg daily, last TSH 1.32 05/19/17    Senile dementia The patient has history of dementia, resides in SNF [facility], one person transfer, w/c dependent, continue  Memantine to preserve memory.   Anticoagulation goal of INR 2 to 3 She has history of Afib, heart rate is in control, taking Coumadin for thromboembolic risk reduction, today's INR 1.4 sub therapeutic, her goal of INR 2-3. Currently on Coumadin 2mg  and 3mg  alternating dose. Administer Coumadin 5mg  07/07/17, then increase Coumadin to 3mg  qd, update PT/INR in one week.       Family/ staff Communication: plan of care reviewed with the patient and charge nurse.   Labs/tests ordered: PT/INR one week.   Time spend 25 minutes.

## 2017-07-07 NOTE — Assessment & Plan Note (Signed)
She has history of Afib, heart rate is in control, taking Coumadin for thromboembolic risk reduction, today's INR 1.4 sub therapeutic, her goal of INR 2-3. Currently on Coumadin 2mg  and 3mg  alternating dose. Administer Coumadin 5mg  07/07/17, then increase Coumadin to 3mg  qd, update PT/INR in one week.

## 2017-07-08 ENCOUNTER — Encounter: Payer: Self-pay | Admitting: *Deleted

## 2017-07-11 ENCOUNTER — Encounter: Payer: Self-pay | Admitting: Nurse Practitioner

## 2017-07-14 DIAGNOSIS — I482 Chronic atrial fibrillation: Secondary | ICD-10-CM | POA: Diagnosis not present

## 2017-07-15 ENCOUNTER — Non-Acute Institutional Stay (SKILLED_NURSING_FACILITY): Payer: PPO | Admitting: Nurse Practitioner

## 2017-07-15 ENCOUNTER — Encounter: Payer: Self-pay | Admitting: Nurse Practitioner

## 2017-07-15 DIAGNOSIS — F418 Other specified anxiety disorders: Secondary | ICD-10-CM | POA: Diagnosis not present

## 2017-07-15 DIAGNOSIS — E038 Other specified hypothyroidism: Secondary | ICD-10-CM

## 2017-07-15 DIAGNOSIS — I10 Essential (primary) hypertension: Secondary | ICD-10-CM | POA: Diagnosis not present

## 2017-07-15 DIAGNOSIS — I480 Paroxysmal atrial fibrillation: Secondary | ICD-10-CM

## 2017-07-15 DIAGNOSIS — E063 Autoimmune thyroiditis: Secondary | ICD-10-CM | POA: Diagnosis not present

## 2017-07-15 DIAGNOSIS — F0391 Unspecified dementia with behavioral disturbance: Secondary | ICD-10-CM | POA: Diagnosis not present

## 2017-07-15 DIAGNOSIS — F03918 Unspecified dementia, unspecified severity, with other behavioral disturbance: Secondary | ICD-10-CM

## 2017-07-15 NOTE — Assessment & Plan Note (Signed)
heart rate are controlled, continue Nebivolol 2.5mg  qd, Diltiazem 120mg  qd. Continue to adjust Coumadin to meet INR goal of 2-3

## 2017-07-15 NOTE — Assessment & Plan Note (Signed)
Her mood is stabilizing, continue Escitalopram 20mg  qd, less prn Lorazepam used in the past week.

## 2017-07-15 NOTE — Progress Notes (Signed)
Location:  [facility] Room Number: 25 Place of Service:  SNF (31) Provider:  Rafferty Postlewait, ManXie  NP  Blanchie Serve, MD  Patient Care Team: Blanchie Serve, MD as PCP - General (Internal Medicine) Tashonna Descoteaux X, NP as Nurse Practitioner (Nurse Practitioner) Wilford Corner, MD as Consulting Physician (Gastroenterology) Sanda Klein, MD as Consulting Physician (Cardiology)  Extended Emergency Contact Information Primary Emergency Contact: [name],Pat Address: [place] [address] [name] of [place] Phone: [phone] Mobile Phone: [phone] Relation: Daughter  Code Status:  DNR Goals of care: Advanced Directive information Advanced Directives 06/27/2017  Does Patient Have a Medical Advance Directive? Yes  Type of Paramedic of [place];Out of facility DNR (pink MOST or yellow form)  Does patient want to make changes to medical advance directive? No - Patient declined  Copy of [place] in Chart? Yes  Pre-existing out of facility DNR order (yellow form or pink MOST form) Yellow form placed in chart (order not valid for inpatient use)     Chief Complaint  Patient presents with  . Medical Management of Chronic Issues    F/U- Depression w/anxiety, Afib    HPI:  Pt is a 84 y.o. female seen today for medical management of chronic diseases.      The patient has history of dementia, resides in SNF [facility], on Memantine 28mg  qd. Her mood is stabilizing on Escitalopram 20mg  qd, less prn Lorazepam used in the past week. Hypothyroidism, on Levothyroxine 45mcg qd. Blood pressure and heart rate are controlled on Nebivolol 2.5mg  qd, Diltiazem 120mg  qd.   Past Medical History:  Diagnosis Date  . Anemia, unspecified   . Anorexia   . Anxiety   . Arthritis   . Atherosclerotic cerebrovascular disease   . Backache, unspecified   . Cataract   . Cholelithiasis   . Chronic kidney disease,  unspecified   . Closed fracture of right inferior pubic ramus ([place]) 11/12/2013   11/10/13 s/p fall, ED eval  X-ray R hip  1. Possible nondisplaced right inferior pubic ramus fracture.  2. No femur fracture or dislocation   02/06/14 dc prn Motrin and Norco-not used    . Depression   . Diaphragmatic hernia without mention of obstruction or gangrene   . Diseases of lips 03/26/2013  . Diverticulosis of colon (without mention of hemorrhage)   . Hemorrhage of rectum and anus 03/26/2013  . Hyperlipidemia   . Hypertension   . Insomnia, unspecified   . Laceration of occipital scalp 11/12/2013  . Macular degeneration 06/28/2013  . Macular degeneration (senile) of retina, unspecified   . Osteoporosis   . Other malaise and fatigue   . Other sleep disturbances   . Other specified cardiac dysrhythmias(427.89)   . Other specified personal history presenting hazards to health(V15.89)   . Other symptoms involving cardiovascular system   . Pancreatitis   . Personal history of other diseases of circulatory system   . Personal history of other diseases of digestive system    pancreatitis from gallstones  . Personal history of other diseases of respiratory system   . Personal history of traumatic fracture   . Rosacea   . Senile osteoporosis    with old T11 fracture  . Thyroid disease   . Unspecified disorders of arteries and arterioles   . Unspecified hearing loss   . Unspecified hemorrhoids without mention of complication    Past Surgical History:  Procedure Laterality Date  . ABDOMINAL HYSTERECTOMY     and BSO fibroids  . COLONOSCOPY  05/23/2007  . EYE LID LIFT Right 09/2016   OD  . HIP ARTHROPLASTY Left 10/31/2016   Procedure: ARTHROPLASTY  HIP (HEMIARTHROPLASTY);  Surgeon: Paralee Cancel, MD;  Location: [facility];  Service: Orthopedics;  Laterality: Left;  . JOINT REPLACEMENT  1996  . TOTAL HIP ARTHROPLASTY Right     Allergies  Allergen Reactions  . Ace Inhibitors Cough  . Mirtazapine Other (See  Comments)    Nightmares   . Penicillins Rash    Has patient had a PCN reaction causing immediate rash, facial/tongue/throat swelling, SOB or lightheadedness with hypotension: Yes Has patient had a PCN reaction causing severe rash involving mucus membranes or skin necrosis: Unknown Has patient had a PCN reaction that required hospitalization: Unknown Has patient had a PCN reaction occurring within the last 10 years: Unknown If all of the above answers are "NO", then may proceed with Cephalosporin use.     Outpatient Encounter Medications as of 07/15/2017  Medication Sig  . acetaminophen (TYLENOL) 500 MG tablet Take 1,000 mg by mouth every 8 (eight) hours as needed for mild pain.  Marland Kitchen alum & mag hydroxide-simeth (MAALOX/MYLANTA) 200-200-20 MG/5ML suspension Take 30 mLs by mouth every 4 (four) hours as needed for indigestion.  . cholecalciferol 2000 units TABS Take 1 tablet (2,000 Units total) by mouth daily.  . clindamycin (CLEOCIN) 150 MG capsule Take 600 mg by mouth See admin instructions. ONE HOUR PRIOR TO DENTAL APPOINTMENTS  . Dextromethorphan-Guaifenesin (ROBAFEN DM) 10-100 MG/5ML liquid Take 10 mLs by mouth every 6 (six) hours as needed.  . diltiazem (CARDIZEM CD) 120 MG 24 hr capsule Take 120 mg by mouth daily.  Marland Kitchen escitalopram (LEXAPRO) 10 MG tablet Take 20 mg by mouth daily.   . hydrocortisone cream 1 % Apply 1 application topically 2 (two) times daily. TO HEMORRHOIDS  . levothyroxine (SYNTHROID, LEVOTHROID) 25 MCG tablet Take 25 mcg by mouth daily before breakfast.   . Lidocaine (ASPERCREME LIDOCAINE) 4 % PTCH Apply 1 patch topically at bedtime. APPLY TO LOWER BACK AND REMOVE IN THE MORNING  . loratadine (CLARITIN) 10 MG tablet Take 10 mg by mouth daily as needed for allergies.  Marland Kitchen LORazepam (ATIVAN) 0.5 MG tablet Take 0.5 mg by mouth every 12 (twelve) hours as needed for anxiety.  . memantine (NAMENDA XR) 28 MG CP24 24 hr capsule Take 28 mg by mouth.  . nebivolol (BYSTOLIC) 2.5 MG  tablet Take 2.5 mg by mouth daily.  . Nutritional Supplements (BENECALORIE PO) Take 1.5 oz by mouth 2 (two) times daily.   . ondansetron (ZOFRAN) 4 MG tablet Take 1 tablet (4 mg total) by mouth every 6 (six) hours as needed for nausea.  . polyethylene glycol (MIRALAX / GLYCOLAX) packet Take 17 g by mouth every other day. For constipation and hold for loose stools  . polyvinyl alcohol (LIQUIFILM TEARS) 1.4 % ophthalmic solution 1 drop as needed for dry eyes.  Marland Kitchen warfarin (COUMADIN) 3 MG tablet Take 3 mg by mouth daily.  . [DISCONTINUED] warfarin (COUMADIN) 2 MG tablet Take 2 mg by mouth. Alternate 2 mg and 3 mg every other day   No facility-administered encounter medications on file as of 07/15/2017.    ROS was provided with assistance of staff Review of Systems  Constitutional: Negative for activity change, appetite change, chills, diaphoresis, fatigue and fever.  HENT: Positive for hearing loss. Negative for congestion, trouble swallowing and voice  change.   Respiratory: Negative for cough, shortness of breath and wheezing.   Cardiovascular: Positive for leg swelling. Negative for chest pain and palpitations.  Gastrointestinal: Negative for abdominal pain, constipation, diarrhea, nausea and vomiting.  Genitourinary: Negative for difficulty urinating, dysuria and urgency.       Incontinent of urine.   Musculoskeletal: Positive for arthralgias and gait problem.  Skin: Negative for color change and pallor.  Neurological: Negative for dizziness, speech difficulty, weakness and headaches.       Dementia  Psychiatric/Behavioral: Positive for agitation and behavioral problems. Negative for hallucinations and sleep disturbance. The patient is nervous/anxious.     Immunization History  Administered Date(s) Administered  . DTaP 10/12/2012  . Influenza-Unspecified 11/22/2012, 12/26/2013, 11/12/2014, 12/09/2015, 12/14/2016  . PPD Test 12/06/2013  . Pneumococcal Conjugate-13 11/05/2016  .  Pneumococcal Polysaccharide-23 09/22/2000  . Td 07/08/2005  . Tdap 11/10/2013   Pertinent  Health Maintenance Due  Topic Date Due  . INFLUENZA VACCINE  09/22/2017  . DEXA SCAN  Completed  . PNA vac Low Risk Adult  Completed   Fall Risk  10/22/2016 01/13/2015 06/28/2013  Falls in the past year? No No No  Risk for fall due to : - Impaired balance/gait -   Functional Status Survey:    Vitals:   07/15/17 1119  BP: 140/80  Pulse: 70  Resp: 20  Temp: 98.2 F (36.8 C)  SpO2: 97%  Weight: 111 lb 8 oz (50.6 kg)  Height: 5' (1.524 m)   Body mass index is 21.78 kg/m. Physical Exam  Constitutional: She appears well-nourished.  HENT:  Head: Normocephalic and atraumatic.  Eyes: Pupils are equal, round, and reactive to light. EOM are normal.  Neck: Normal range of motion. Neck supple. No JVD present. No thyromegaly present.  Cardiovascular: Normal rate.  Murmur heard. Irregular heart beats.   Pulmonary/Chest: Effort normal and breath sounds normal. She has no wheezes. She has no rales.  Abdominal: Soft. Bowel sounds are normal.  Musculoskeletal: She exhibits edema.  Trace edema BLE. One person transfer, w/c for mobility.   Neurological: She is alert. No cranial nerve deficit. She exhibits normal muscle tone. Coordination normal.  Oriented to person and her room on unit.   Skin: Skin is warm and dry.  Psychiatric: She has a normal mood and affect.    Labs reviewed: Recent Labs    10/31/16 0236 11/01/16 0353 11/02/16 0445  01/04/17 05/19/17 06/15/17 06/16/17 0819  NA 140 140 139   < > 141 142 142  --   K 3.7 3.8 3.9   < > 3.8 4.1 4.7  --   CL 112* 113* 112*  --   --   --   --   --   CO2 21* 21* 20*  --   --   --   --  29  GLUCOSE 108* 149* 110*  --   --   --   --   --   BUN 27* 32* 24*   < > 28* 39* 32*  --   CREATININE 0.90 0.90 0.80   < > 0.8 1.2* 1.1  --   CALCIUM 8.2* 8.3* 8.3*  --   --   --   --   --    < > = values in this interval not displayed.   Recent Labs     10/28/16 0329 01/04/17 05/19/17 06/15/17  AST  --  17 16 16   ALT  --  10 8 8   ALKPHOS  --  75 70 76  ALBUMIN 3.0*  --   --   --    Recent Labs    10/27/16 1715  11/01/16 0353 11/02/16 0445 11/03/16 0318  01/04/17 05/19/17 06/15/17  WBC 9.6   < > 6.0 7.4 8.4   < > 5.5 6.2 3.7  NEUTROABS 8.4*  --   --   --   --   --   --   --   --   HGB 12.4   < > 10.0* 9.5* 10.3*   < > 11.3* 9.8* 10.7*  HCT 39.1   < > 31.2* 29.8* 32.6*   < > 33* 30* 32*  MCV 94.7   < > 94.8 96.1 95.0  --   --   --   --   PLT 174   < > 179 163 197   < > 254 242 229   < > = values in this interval not displayed.   Lab Results  Component Value Date   TSH 1.32 05/19/2017   No results found for: HGBA1C No results found for: CHOL, HDL, LDLCALC, LDLDIRECT, TRIG, CHOLHDL  Significant Diagnostic Results in last 30 days:  No results found.  Assessment/Plan HTN (hypertension) Blood pressure, controlled, continue Nebivolol 2.5mg  qd, Diltiazem 120mg  qd.    A-fib (HCC) heart rate are controlled, continue Nebivolol 2.5mg  qd, Diltiazem 120mg  qd. Continue to adjust Coumadin to meet INR goal of 2-3   Hypothyroidism Hypothyroidism, continue Levothyroxine 18mcg qd, TSH 1.31 05/19/17  Senile dementia dementia, resides in SNF [facility], continue Memantine 28mg  qd.  Depression with anxiety  Her mood is stabilizing, continue Escitalopram 20mg  qd, less prn Lorazepam used in the past week.      Family/ staff Communication: plan of care reviewed with the patient and charge nurse.   Labs/tests ordered:  none  Time spend 25 minutes.

## 2017-07-15 NOTE — Assessment & Plan Note (Signed)
Hypothyroidism, continue Levothyroxine 66mcg qd, TSH 1.31 05/19/17

## 2017-07-15 NOTE — Assessment & Plan Note (Signed)
Blood pressure, controlled, continue Nebivolol 2.5mg  qd, Diltiazem 120mg  qd.

## 2017-07-15 NOTE — Assessment & Plan Note (Signed)
dementia, resides in SNF [facility], continue Memantine 28mg  qd.

## 2017-07-21 DIAGNOSIS — I481 Persistent atrial fibrillation: Secondary | ICD-10-CM | POA: Diagnosis not present

## 2017-07-21 DIAGNOSIS — I482 Chronic atrial fibrillation: Secondary | ICD-10-CM | POA: Diagnosis not present

## 2017-07-25 ENCOUNTER — Non-Acute Institutional Stay (SKILLED_NURSING_FACILITY): Payer: PPO | Admitting: Nurse Practitioner

## 2017-07-25 ENCOUNTER — Encounter: Payer: Self-pay | Admitting: Nurse Practitioner

## 2017-07-25 DIAGNOSIS — F418 Other specified anxiety disorders: Secondary | ICD-10-CM

## 2017-07-25 DIAGNOSIS — I480 Paroxysmal atrial fibrillation: Secondary | ICD-10-CM

## 2017-07-25 DIAGNOSIS — F0391 Unspecified dementia with behavioral disturbance: Secondary | ICD-10-CM | POA: Diagnosis not present

## 2017-07-25 DIAGNOSIS — I10 Essential (primary) hypertension: Secondary | ICD-10-CM | POA: Diagnosis not present

## 2017-07-25 DIAGNOSIS — F03918 Unspecified dementia, unspecified severity, with other behavioral disturbance: Secondary | ICD-10-CM

## 2017-07-25 NOTE — Assessment & Plan Note (Signed)
Currently her Coumadin is on hold for eyelid surgery 07/27/17, irregular heart beats in controlled rate. Continue Diltiazem.

## 2017-07-25 NOTE — Progress Notes (Addendum)
Location:  [place] Room Number: 19 Place of Service:  SNF (31) Provider:  Evanthia Maund, ManXie  NP  Blanchie Serve, MD  Patient Care Team: Blanchie Serve, MD as PCP - General (Internal Medicine) Willadeen Colantuono X, NP as Nurse Practitioner (Nurse Practitioner) Wilford Corner, MD as Consulting Physician (Gastroenterology) Sanda Klein, MD as Consulting Physician (Cardiology)  Extended Emergency Contact Information Primary Emergency Contact: [name],Pat Address: [place] [address] [name] of [place] Phone: [phone] Mobile Phone: [phone] Relation: Daughter  Code Status:  DNR Goals of care: Advanced Directive information Advanced Directives 07/25/2017  Does Patient Have a Medical Advance Directive? Yes  Type of Paramedic of [place];Out of facility DNR (pink MOST or yellow form)  Does patient want to make changes to medical advance directive? No - Patient declined  Copy of [place] in Chart? Yes  Pre-existing out of facility DNR order (yellow form or pink MOST form) Yellow form placed in chart (order not valid for inpatient use)     Chief Complaint  Patient presents with  . Acute Visit    14 day re-eval for psychotrophic medication    HPI:  Pt is a 84 y.o. female seen today for an acute visit for evaluation of need for continuing of Lorazepam 0.5mg , lasted used prn Lorazepam 0.5mg  was 5/38/19. The patient has history of dementia, she resides in SNF [facility], in wheelchair for mobility, taking Memantine for memory. Her mood is stabilized since Escitalopram was titrated up to 20mg  daily.   The patient was noted to have elevated SBp today, she refused am Bystoic and Diltriazem, she is asymptomatic. Her blood pressure is controlled usually.    Past Medical History:  Diagnosis Date  . Anemia, unspecified   . Anorexia   . Anxiety   . Arthritis   . Atherosclerotic cerebrovascular  disease   . Backache, unspecified   . Cataract   . Cholelithiasis   . Chronic kidney disease, unspecified   . Closed fracture of right inferior pubic ramus ([place]) 11/12/2013   11/10/13 s/p fall, ED eval  X-ray R hip  1. Possible nondisplaced right inferior pubic ramus fracture.  2. No femur fracture or dislocation   02/06/14 dc prn Motrin and Norco-not used    . Depression   . Diaphragmatic hernia without mention of obstruction or gangrene   . Diseases of lips 03/26/2013  . Diverticulosis of colon (without mention of hemorrhage)   . Hemorrhage of rectum and anus 03/26/2013  . Hyperlipidemia   . Hypertension   . Insomnia, unspecified   . Laceration of occipital scalp 11/12/2013  . Macular degeneration 06/28/2013  . Macular degeneration (senile) of retina, unspecified   . Osteoporosis   . Other malaise and fatigue   . Other sleep disturbances   . Other specified cardiac dysrhythmias(427.89)   . Other specified personal history presenting hazards to health(V15.89)   . Other symptoms involving cardiovascular system   . Pancreatitis   . Personal history of other diseases of circulatory system   . Personal history of other diseases of digestive system    pancreatitis from gallstones  . Personal history of other diseases of respiratory system   . Personal history of traumatic fracture   . Rosacea   . Senile osteoporosis    with old T11 fracture  . Thyroid disease   . Unspecified disorders of arteries and arterioles   . Unspecified  hearing loss   . Unspecified hemorrhoids without mention of complication    Past Surgical History:  Procedure Laterality Date  . ABDOMINAL HYSTERECTOMY     and BSO fibroids  . COLONOSCOPY  05/23/2007  . EYE LID LIFT Right 09/2016   OD  . HIP ARTHROPLASTY Left 10/31/2016   Procedure: ARTHROPLASTY  HIP (HEMIARTHROPLASTY);  Surgeon: Paralee Cancel, MD;  Location: [place];  Service: Orthopedics;  Laterality: Left;  . JOINT REPLACEMENT  1996  . TOTAL HIP ARTHROPLASTY  Right     Allergies  Allergen Reactions  . Ace Inhibitors Cough  . Mirtazapine Other (See Comments)    Nightmares   . Penicillins Rash    Has patient had a PCN reaction causing immediate rash, facial/tongue/throat swelling, SOB or lightheadedness with hypotension: Yes Has patient had a PCN reaction causing severe rash involving mucus membranes or skin necrosis: Unknown Has patient had a PCN reaction that required hospitalization: Unknown Has patient had a PCN reaction occurring within the last 10 years: Unknown If all of the above answers are "NO", then may proceed with Cephalosporin use.     Outpatient Encounter Medications as of 07/25/2017  Medication Sig  . acetaminophen (TYLENOL) 500 MG tablet Take 1,000 mg by mouth every 8 (eight) hours as needed for mild pain.  Marland Kitchen alum & mag hydroxide-simeth (MAALOX/MYLANTA) 200-200-20 MG/5ML suspension Take 30 mLs by mouth every 4 (four) hours as needed for indigestion.  . cholecalciferol 2000 units TABS Take 1 tablet (2,000 Units total) by mouth daily.  . clindamycin (CLEOCIN) 150 MG capsule Take 600 mg by mouth See admin instructions. ONE HOUR PRIOR TO DENTAL APPOINTMENTS  . diltiazem (CARDIZEM CD) 120 MG 24 hr capsule Take 120 mg by mouth daily.  Marland Kitchen escitalopram (LEXAPRO) 10 MG tablet Take 20 mg by mouth daily.   . hydrocortisone cream 1 % Apply 1 application topically 2 (two) times daily. TO HEMORRHOIDS  . levothyroxine (SYNTHROID, LEVOTHROID) 25 MCG tablet Take 25 mcg by mouth daily before breakfast.   . Lidocaine (ASPERCREME LIDOCAINE) 4 % PTCH Apply 1 patch topically at bedtime. APPLY TO LOWER BACK AND REMOVE IN THE MORNING  . loratadine (CLARITIN) 10 MG tablet Take 10 mg by mouth daily as needed for allergies.  . memantine (NAMENDA XR) 28 MG CP24 24 hr capsule Take 28 mg by mouth.  . nebivolol (BYSTOLIC) 2.5 MG tablet Take 2.5 mg by mouth daily.  . Nutritional Supplements (BENECALORIE PO) Take 1.5 oz by mouth 2 (two) times daily.   .  ondansetron (ZOFRAN) 4 MG tablet Take 1 tablet (4 mg total) by mouth every 6 (six) hours as needed for nausea.  . polyethylene glycol (MIRALAX / GLYCOLAX) packet Take 17 g by mouth every other day. For constipation and hold for loose stools  . polyvinyl alcohol (LIQUIFILM TEARS) 1.4 % ophthalmic solution 1 drop as needed for dry eyes.  . [DISCONTINUED] LORazepam (ATIVAN) 0.5 MG tablet Take 0.5 mg by mouth every 12 (twelve) hours as needed for anxiety.  Marland Kitchen warfarin (COUMADIN) 3 MG tablet Take 3 mg by mouth daily. Medication is on hold for eye surgery scheduled for 07/27/2017.  . [DISCONTINUED] Dextromethorphan-Guaifenesin (ROBAFEN DM) 10-100 MG/5ML liquid Take 10 mLs by mouth every 6 (six) hours as needed.   No facility-administered encounter medications on file as of 07/25/2017.    ROS was provided with assistance of staff Review of Systems  Constitutional: Positive for fatigue. Negative for activity change, appetite change, chills, diaphoresis and fever.  HENT: Positive  for hearing loss. Negative for congestion.   Eyes:       Lower eyelids turning outwards.   Respiratory: Negative for cough and shortness of breath.   Cardiovascular: Negative for chest pain, palpitations and leg swelling.  Skin: Negative for color change and pallor.  Neurological: Negative for dizziness, tremors, facial asymmetry, speech difficulty, weakness and headaches.       Dementia  Psychiatric/Behavioral: Positive for confusion. Negative for agitation, behavioral problems and sleep disturbance. The patient is not nervous/anxious.     Immunization History  Administered Date(s) Administered  . DTaP 10/12/2012  . Influenza-Unspecified 11/22/2012, 12/26/2013, 11/12/2014, 12/09/2015, 12/14/2016  . PPD Test 12/06/2013  . Pneumococcal Conjugate-13 11/05/2016  . Pneumococcal Polysaccharide-23 09/22/2000  . Td 07/08/2005  . Tdap 11/10/2013   Pertinent  Health Maintenance Due  Topic Date Due  . INFLUENZA VACCINE   09/22/2017  . DEXA SCAN  Completed  . PNA vac Low Risk Adult  Completed   Fall Risk  10/22/2016 01/13/2015 06/28/2013  Falls in the past year? No No No  Risk for fall due to : - Impaired balance/gait -   Functional Status Survey:    Vitals:   07/25/17 1104  BP: (!) 178/66  Pulse: 70  Resp: 18  Temp: 97.7 F (36.5 C)  Weight: 112 lb 3.2 oz (50.9 kg)  Height: 5' (1.524 m)   Body mass index is 21.91 kg/m. Physical Exam  Constitutional: She appears well-developed and well-nourished. No distress.  HENT:  Head: Normocephalic and atraumatic.  Eyes: Pupils are equal, round, and reactive to light. Conjunctivae and EOM are normal. Right eye exhibits no discharge. Left eye exhibits no discharge.  Lower eyelids ectropion   Neck: Normal range of motion. Neck supple. No JVD present. No thyromegaly present.  Neurological: She is alert. No cranial nerve deficit. She exhibits normal muscle tone. Coordination normal.  Oriented to self.   Skin: Skin is warm and dry. She is not diaphoretic.  Psychiatric: She has a normal mood and affect. Her behavior is normal.    Labs reviewed: Recent Labs    10/31/16 0236 11/01/16 0353 11/02/16 0445  01/04/17 05/19/17 06/15/17 06/16/17 0819  NA 140 140 139   < > 141 142 142  --   K 3.7 3.8 3.9   < > 3.8 4.1 4.7  --   CL 112* 113* 112*  --   --   --   --   --   CO2 21* 21* 20*  --   --   --   --  29  GLUCOSE 108* 149* 110*  --   --   --   --   --   BUN 27* 32* 24*   < > 28* 39* 32*  --   CREATININE 0.90 0.90 0.80   < > 0.8 1.2* 1.1  --   CALCIUM 8.2* 8.3* 8.3*  --   --   --   --   --    < > = values in this interval not displayed.   Recent Labs    10/28/16 0329 01/04/17 05/19/17 06/15/17  AST  --  17 16 16   ALT  --  10 8 8   ALKPHOS  --  75 70 76  ALBUMIN 3.0*  --   --   --    Recent Labs    10/27/16 1715  11/01/16 0353 11/02/16 0445 11/03/16 0318  01/04/17 05/19/17 06/15/17  WBC 9.6   < > 6.0 7.4 8.4   < >  5.5 6.2 3.7  NEUTROABS 8.4*  --    --   --   --   --   --   --   --   HGB 12.4   < > 10.0* 9.5* 10.3*   < > 11.3* 9.8* 10.7*  HCT 39.1   < > 31.2* 29.8* 32.6*   < > 33* 30* 32*  MCV 94.7   < > 94.8 96.1 95.0  --   --   --   --   PLT 174   < > 179 163 197   < > 254 242 229   < > = values in this interval not displayed.   Lab Results  Component Value Date   TSH 1.32 05/19/2017   No results found for: HGBA1C No results found for: CHOL, HDL, LDLCALC, LDLDIRECT, TRIG, CHOLHDL  Significant Diagnostic Results in last 30 days:  No results found.  Assessment/Plan Depression with anxiety Stable, may consider prn Lorazepam when needed. Continue Escitalopram 20mg  daily. Observe.   Senile dementia Continue SNF [facility] for care assistance, continue Memantine 28mg  daily. observe  A-fib [facility]) Currently her Coumadin is on hold for eyelid surgery 07/27/17, irregular heart beats in controlled rate. Continue Diltiazem.   HTN (hypertension) Elevated SBP in 170s today, she refused am Bystolic and Diltiazem, she denied headache, vision change, nausea, vomiting, chest pain/pressure, or palpitation. Will encourage the patient comply with medications, monitor VS q shift.     Family/ staff Communication: plan of care reviewed with the patient and charge nurse.   Labs/tests ordered:  None  Time spend 25 minutes.

## 2017-07-25 NOTE — Assessment & Plan Note (Signed)
Elevated SBP in 170s today, she refused am Bystolic and Diltiazem, she denied headache, vision change, nausea, vomiting, chest pain/pressure, or palpitation. Will encourage the patient comply with medications, monitor VS q shift.

## 2017-07-25 NOTE — Assessment & Plan Note (Signed)
Continue SNF [facility] for care assistance, continue Memantine 28mg  daily. observe

## 2017-07-25 NOTE — Assessment & Plan Note (Signed)
Stable, may consider prn Lorazepam when needed. Continue Escitalopram 20mg  daily. Observe.

## 2017-07-26 DIAGNOSIS — I482 Chronic atrial fibrillation: Secondary | ICD-10-CM | POA: Diagnosis not present

## 2017-07-26 DIAGNOSIS — I481 Persistent atrial fibrillation: Secondary | ICD-10-CM | POA: Diagnosis not present

## 2017-07-27 DIAGNOSIS — H02135 Senile ectropion of left lower eyelid: Secondary | ICD-10-CM | POA: Diagnosis not present

## 2017-07-27 DIAGNOSIS — H02402 Unspecified ptosis of left eyelid: Secondary | ICD-10-CM | POA: Diagnosis not present

## 2017-07-27 DIAGNOSIS — H02106 Unspecified ectropion of left eye, unspecified eyelid: Secondary | ICD-10-CM | POA: Diagnosis not present

## 2017-07-28 DIAGNOSIS — I482 Chronic atrial fibrillation: Secondary | ICD-10-CM | POA: Diagnosis not present

## 2017-07-28 DIAGNOSIS — I481 Persistent atrial fibrillation: Secondary | ICD-10-CM | POA: Diagnosis not present

## 2017-08-02 DIAGNOSIS — I481 Persistent atrial fibrillation: Secondary | ICD-10-CM | POA: Diagnosis not present

## 2017-08-09 DIAGNOSIS — Z7901 Long term (current) use of anticoagulants: Secondary | ICD-10-CM | POA: Diagnosis not present

## 2017-08-11 ENCOUNTER — Non-Acute Institutional Stay (SKILLED_NURSING_FACILITY): Payer: PPO | Admitting: Nurse Practitioner

## 2017-08-11 ENCOUNTER — Encounter: Payer: Self-pay | Admitting: Nurse Practitioner

## 2017-08-11 DIAGNOSIS — F03918 Unspecified dementia, unspecified severity, with other behavioral disturbance: Secondary | ICD-10-CM

## 2017-08-11 DIAGNOSIS — K5901 Slow transit constipation: Secondary | ICD-10-CM | POA: Diagnosis not present

## 2017-08-11 DIAGNOSIS — F418 Other specified anxiety disorders: Secondary | ICD-10-CM | POA: Diagnosis not present

## 2017-08-11 DIAGNOSIS — I480 Paroxysmal atrial fibrillation: Secondary | ICD-10-CM | POA: Diagnosis not present

## 2017-08-11 DIAGNOSIS — F0391 Unspecified dementia with behavioral disturbance: Secondary | ICD-10-CM

## 2017-08-11 NOTE — Assessment & Plan Note (Signed)
He mood is stable, continue Escitalopram 20mg  qd.

## 2017-08-11 NOTE — Progress Notes (Signed)
Location:  [place] Room Number: 32 Place of Service:  SNF (31) Provider:  Sakshi Sermons, ManXie  NP  Blanchie Serve, MD  Patient Care Team: Blanchie Serve, MD as PCP - General (Internal Medicine) Roanna Reaves X, NP as Nurse Practitioner (Nurse Practitioner) Wilford Corner, MD as Consulting Physician (Gastroenterology) Sanda Klein, MD as Consulting Physician (Cardiology)  Extended Emergency Contact Information Primary Emergency Contact: [name],Pat Address: [place] [address] [name] [place] Phone: [phone] Mobile Phone: [phone] Relation: Daughter  Code Status: DNR Goals of care: Advanced Directive information Advanced Directives 07/25/2017  Does Patient Have a Medical Advance Directive? Yes  Type of Paramedic of [place];Out of facility DNR (pink MOST or yellow form)  Does patient want to make changes to medical advance directive? No - Patient declined  Copy of [place] in Chart? Yes  Pre-existing out of facility DNR order (yellow form or pink MOST form) Yellow form placed in chart (order not valid for inpatient use)     Chief Complaint  Patient presents with  . Medical Management of Chronic Issues    F/u- HTN, depression w/ anxiety,     HPI:  Pt is a 84 y.o. female seen today for medical management of chronic diseases.    The patient has history of dementia, she resides in SNF [facility], self propels w/c to get around, usually she is one person transfer, taking Memantine 28mg  qd for memory. Hx of Afib, heart rate is in control, on Beivolol 2.5mg  qd, Diltiazem 120mg  qd. She takes Coumadin for thromboembolic risk reduction. No constipation while on MiraLax qod. He mood is stable on Escitalopram 20mg  qd.   Past Medical History:  Diagnosis Date  . Anemia, unspecified   . Anorexia   . Anxiety   . Arthritis   . Atherosclerotic cerebrovascular disease   . Backache,  unspecified   . Cataract   . Cholelithiasis   . Chronic kidney disease, unspecified   . Closed fracture of right inferior pubic ramus ([place]) 11/12/2013   11/10/13 s/p fall, ED eval  X-ray R hip  1. Possible nondisplaced right inferior pubic ramus fracture.  2. No femur fracture or dislocation   02/06/14 dc prn Motrin and Norco-not used    . Depression   . Diaphragmatic hernia without mention of obstruction or gangrene   . Diseases of lips 03/26/2013  . Diverticulosis of colon (without mention of hemorrhage)   . Hemorrhage of rectum and anus 03/26/2013  . Hyperlipidemia   . Hypertension   . Insomnia, unspecified   . Laceration of occipital scalp 11/12/2013  . Macular degeneration 06/28/2013  . Macular degeneration (senile) of retina, unspecified   . Osteoporosis   . Other malaise and fatigue   . Other sleep disturbances   . Other specified cardiac dysrhythmias(427.89)   . Other specified personal history presenting hazards to health(V15.89)   . Other symptoms involving cardiovascular system   . Pancreatitis   . Personal history of other diseases of circulatory system   . Personal history of other diseases of digestive system    pancreatitis from gallstones  . Personal history of other diseases of respiratory system   . Personal history of traumatic fracture   . Rosacea   . Senile osteoporosis    with old T11 fracture  . Thyroid disease   . Unspecified disorders of arteries and arterioles   . Unspecified hearing loss   .  Unspecified hemorrhoids without mention of complication    Past Surgical History:  Procedure Laterality Date  . ABDOMINAL HYSTERECTOMY     and BSO fibroids  . COLONOSCOPY  05/23/2007  . EYE LID LIFT Right 09/2016   OD  . HIP ARTHROPLASTY Left 10/31/2016   Procedure: ARTHROPLASTY  HIP (HEMIARTHROPLASTY);  Surgeon: Paralee Cancel, MD;  Location: [place];  Service: Orthopedics;  Laterality: Left;  . JOINT REPLACEMENT  1996  . TOTAL HIP ARTHROPLASTY Right     Allergies    Allergen Reactions  . Ace Inhibitors Cough  . Mirtazapine Other (See Comments)    Nightmares   . Penicillins Rash    Has patient had a PCN reaction causing immediate rash, facial/tongue/throat swelling, SOB or lightheadedness with hypotension: Yes Has patient had a PCN reaction causing severe rash involving mucus membranes or skin necrosis: Unknown Has patient had a PCN reaction that required hospitalization: Unknown Has patient had a PCN reaction occurring within the last 10 years: Unknown If all of the above answers are "NO", then may proceed with Cephalosporin use.     Outpatient Encounter Medications as of 08/11/2017  Medication Sig  . acetaminophen (TYLENOL) 500 MG tablet Take 1,000 mg by mouth every 8 (eight) hours as needed for mild pain.  Marland Kitchen alum & mag hydroxide-simeth (MAALOX/MYLANTA) 200-200-20 MG/5ML suspension Take 30 mLs by mouth every 4 (four) hours as needed for indigestion.  . cholecalciferol 2000 units TABS Take 1 tablet (2,000 Units total) by mouth daily.  . clindamycin (CLEOCIN) 150 MG capsule Take 600 mg by mouth See admin instructions. ONE HOUR PRIOR TO DENTAL APPOINTMENTS  . diltiazem (CARDIZEM CD) 120 MG 24 hr capsule Take 120 mg by mouth daily.  Marland Kitchen escitalopram (LEXAPRO) 10 MG tablet Take 20 mg by mouth daily.   . hydrocortisone cream 1 % Apply 1 application topically 2 (two) times daily. TO HEMORRHOIDS  . levothyroxine (SYNTHROID, LEVOTHROID) 25 MCG tablet Take 25 mcg by mouth daily before breakfast.   . Lidocaine (ASPERCREME LIDOCAINE) 4 % PTCH Apply 1 patch topically at bedtime. APPLY TO LOWER BACK AND REMOVE IN THE MORNING  . loratadine (CLARITIN) 10 MG tablet Take 10 mg by mouth daily as needed for allergies.  . memantine (NAMENDA XR) 28 MG CP24 24 hr capsule Take 28 mg by mouth.  . nebivolol (BYSTOLIC) 2.5 MG tablet Take 2.5 mg by mouth daily.  . Nutritional Supplements (BENECALORIE PO) Take 1.5 oz by mouth 2 (two) times daily.   . ondansetron (ZOFRAN) 4 MG  tablet Take 1 tablet (4 mg total) by mouth every 6 (six) hours as needed for nausea.  . polyethylene glycol (MIRALAX / GLYCOLAX) packet Take 17 g by mouth every other day. For constipation and hold for loose stools  . polyvinyl alcohol (LIQUIFILM TEARS) 1.4 % ophthalmic solution 1 drop as needed for dry eyes.  Marland Kitchen warfarin (COUMADIN) 3 MG tablet Take 3 mg by mouth daily. Medication is on hold for eye surgery scheduled for 07/27/2017.   No facility-administered encounter medications on file as of 08/11/2017.    ROS was provided with assistance of staff Review of Systems  Constitutional: Negative for activity change, appetite change, chills, diaphoresis, fatigue and fever.  HENT: Positive for hearing loss. Negative for congestion, trouble swallowing and voice change.   Respiratory: Negative for cough, shortness of breath and wheezing.   Cardiovascular: Negative for chest pain, palpitations and leg swelling.  Gastrointestinal: Negative for abdominal distention, abdominal pain, constipation, diarrhea, nausea and vomiting.  Genitourinary: Negative for difficulty urinating, dysuria and urgency.       Incontinent of urine.   Musculoskeletal: Positive for gait problem.  Skin: Negative for color change and pallor.  Neurological: Negative for dizziness, speech difficulty, weakness and headaches.       Dementia  Psychiatric/Behavioral: Positive for confusion. Negative for agitation, behavioral problems, hallucinations and sleep disturbance. The patient is not nervous/anxious.     Immunization History  Administered Date(s) Administered  . DTaP 10/12/2012  . Influenza-Unspecified 11/22/2012, 12/26/2013, 11/12/2014, 12/09/2015, 12/14/2016  . PPD Test 12/06/2013  . Pneumococcal Conjugate-13 11/05/2016  . Pneumococcal Polysaccharide-23 09/22/2000  . Td 07/08/2005  . Tdap 11/10/2013   Pertinent  Health Maintenance Due  Topic Date Due  . INFLUENZA VACCINE  09/22/2017  . DEXA SCAN  Completed  . PNA vac  Low Risk Adult  Completed   Fall Risk  10/22/2016 01/13/2015 06/28/2013  Falls in the past year? No No No  Risk for fall due to : - Impaired balance/gait -   Functional Status Survey:    Vitals:   08/11/17 1207  BP: (!) 170/68  Pulse: 66  Resp: 20  Temp: 97.6 F (36.4 C)  SpO2: 96%  Weight: 112 lb 3.2 oz (50.9 kg)  Height: 5' (1.524 m)   Body mass index is 21.91 kg/m. Physical Exam  Constitutional: She appears well-developed and well-nourished.  HENT:  Head: Normocephalic and atraumatic.  Eyes: Pupils are equal, round, and reactive to light. EOM are normal.  Neck: Normal range of motion. Neck supple. No JVD present. No thyromegaly present.  Cardiovascular: Normal rate.  Murmur heard. Irregular heart beats.   Pulmonary/Chest: Effort normal and breath sounds normal. She has no wheezes. She has no rales.  Abdominal: Soft. Bowel sounds are normal. She exhibits no distension. There is no tenderness. There is no rebound and no guarding.  Musculoskeletal: She exhibits no edema.  One person transfer, w/c for mobility.   Neurological: She is alert.  Oriented to self and her room on unit.   Skin: Skin is warm and dry.  Psychiatric: She has a normal mood and affect. Her behavior is normal.    Labs reviewed: Recent Labs    10/31/16 0236 11/01/16 0353 11/02/16 0445  01/04/17 05/19/17 06/15/17 06/16/17 0819  NA 140 140 139   < > 141 142 142  --   K 3.7 3.8 3.9   < > 3.8 4.1 4.7  --   CL 112* 113* 112*  --   --   --   --   --   CO2 21* 21* 20*  --   --   --   --  29  GLUCOSE 108* 149* 110*  --   --   --   --   --   BUN 27* 32* 24*   < > 28* 39* 32*  --   CREATININE 0.90 0.90 0.80   < > 0.8 1.2* 1.1  --   CALCIUM 8.2* 8.3* 8.3*  --   --   --   --   --    < > = values in this interval not displayed.   Recent Labs    10/28/16 0329 01/04/17 05/19/17 06/15/17  AST  --  17 16 16   ALT  --  10 8 8   ALKPHOS  --  75 70 76  ALBUMIN 3.0*  --   --   --    Recent Labs     10/27/16 1715  11/01/16 0353  11/02/16 0445 11/03/16 0318  01/04/17 05/19/17 06/15/17  WBC 9.6   < > 6.0 7.4 8.4   < > 5.5 6.2 3.7  NEUTROABS 8.4*  --   --   --   --   --   --   --   --   HGB 12.4   < > 10.0* 9.5* 10.3*   < > 11.3* 9.8* 10.7*  HCT 39.1   < > 31.2* 29.8* 32.6*   < > 33* 30* 32*  MCV 94.7   < > 94.8 96.1 95.0  --   --   --   --   PLT 174   < > 179 163 197   < > 254 242 229   < > = values in this interval not displayed.   Lab Results  Component Value Date   TSH 1.32 05/19/2017   No results found for: HGBA1C No results found for: CHOL, HDL, LDLCALC, LDLDIRECT, TRIG, CHOLHDL  Significant Diagnostic Results in last 30 days:  No results found.  Assessment/Plan A-fib (HCC) Hx of Afib, heart rate is in control, continue Nebivolol 2.5mg  qd, Diltiazem 120mg  qd. Continue Coumadin for thromboembolic risk reduction.  Senile dementia dementia, she resides in SNF [facility], self propels w/c to get around, usually she is one person transfer, continue  Memantine 28mg  qd for memory.   Constipation No constipation, conitnue MiraLax qod.    Depression with anxiety He mood is stable, continue Escitalopram 20mg  qd.       Family/ staff Communication:plan of care reviewed with the patient and charge nurse.   Labs/tests ordered:  none  Time spend 25 minutes.

## 2017-08-11 NOTE — Assessment & Plan Note (Signed)
No constipation, conitnue MiraLax qod.

## 2017-08-11 NOTE — Assessment & Plan Note (Addendum)
Hx of Afib, heart rate is in control, continue Nebivolol 2.5mg  qd, Diltiazem 120mg  qd. Continue Coumadin for thromboembolic risk reduction.

## 2017-08-11 NOTE — Assessment & Plan Note (Signed)
dementia, she resides in SNF [facility], self propels w/c to get around, usually she is one person transfer, continue  Memantine 28mg  qd for memory.

## 2017-08-15 ENCOUNTER — Encounter: Payer: Self-pay | Admitting: Nurse Practitioner

## 2017-08-16 DIAGNOSIS — I482 Chronic atrial fibrillation: Secondary | ICD-10-CM | POA: Diagnosis not present

## 2017-08-19 ENCOUNTER — Encounter: Payer: Self-pay | Admitting: Nurse Practitioner

## 2017-08-19 ENCOUNTER — Non-Acute Institutional Stay (SKILLED_NURSING_FACILITY): Payer: PPO | Admitting: Nurse Practitioner

## 2017-08-19 DIAGNOSIS — F418 Other specified anxiety disorders: Secondary | ICD-10-CM

## 2017-08-19 DIAGNOSIS — E063 Autoimmune thyroiditis: Secondary | ICD-10-CM

## 2017-08-19 DIAGNOSIS — F03918 Unspecified dementia, unspecified severity, with other behavioral disturbance: Secondary | ICD-10-CM

## 2017-08-19 DIAGNOSIS — E038 Other specified hypothyroidism: Secondary | ICD-10-CM | POA: Diagnosis not present

## 2017-08-19 DIAGNOSIS — F0391 Unspecified dementia with behavioral disturbance: Secondary | ICD-10-CM | POA: Diagnosis not present

## 2017-08-19 NOTE — Assessment & Plan Note (Addendum)
Emotional outburst, better than prior, continue Lexapro 20mg  daily, have Lorazepam 0.5mg  bid with at least 2 hours apart between doses x 14 days, update CBC/diff, CMP, TSH. VS qshiftx 72 hours.

## 2017-08-19 NOTE — Progress Notes (Signed)
Location:   SNF [facility] Room Number: 21 Place of Service:  SNF (31) Provider: Lennie Odor Kyani Simkin NP  Blanchie Serve, MD  Patient Care Team: Blanchie Serve, MD as PCP - General (Internal Medicine) Rivan Siordia X, NP as Nurse Practitioner (Nurse Practitioner) Wilford Corner, MD as Consulting Physician (Gastroenterology) Sanda Klein, MD as Consulting Physician (Cardiology)  Extended Emergency Contact Information Primary Emergency Contact: [name],Pat Address: [place] [address] [name] of [place] Phone: [phone] Mobile Phone: [phone] Relation: Daughter  Code Status: DNR Goals of care: Advanced Directive information Advanced Directives 07/25/2017  Does Patient Have a Medical Advance Directive? Yes  Type of Paramedic of [place];Out of facility DNR (pink MOST or yellow form)  Does patient want to make changes to medical advance directive? No - Patient declined  Copy of [place] in Chart? Yes  Pre-existing out of facility DNR order (yellow form or pink MOST form) Yellow form placed in chart (order not valid for inpatient use)     Chief Complaint  Patient presents with  . Anxiety  . Dementia    HPI:  Pt is a 84 y.o. female seen today for an acute visit for emotional outburst episodes. 08/18/17 reported the patient's yelling, creaming, trying to get out of chair, wanting to go home to find her mother, agitated, anxious, spit meds out. Hx of dementia, resides in SNF [facility], w/c for mobility, on Memantine for memory. Her anxiety is better managed on Lexapro 20mg  since 07/07/17, not needed prn Lorazepam about 3 weeks. She slept well last night, eats breakfast as usual. Hx of hypothyroidism, on Levothyroxine 34mcg qd, last TSH wnl.                                                           .    0 ............................................ ..... .       Past Medical History:  Diagnosis Date  . Anemia, unspecified   . Anorexia   . Anxiety   . Arthritis   . Atherosclerotic cerebrovascular disease   . Backache, unspecified   . Cataract   . Cholelithiasis   . Chronic kidney disease, unspecified   . Closed fracture of right inferior pubic ramus ([place]) 11/12/2013   11/10/13 s/p fall, ED eval  X-ray R hip  1. Possible nondisplaced right inferior pubic ramus fracture.  2. No femur fracture or dislocation   02/06/14 dc prn Motrin and Norco-not used    . Depression   . Diaphragmatic hernia without mention of obstruction or gangrene   . Diseases of lips 03/26/2013  . Diverticulosis of colon (without mention of hemorrhage)   . Hemorrhage of rectum and anus 03/26/2013  . Hyperlipidemia   . Hypertension   . Insomnia, unspecified   . Laceration of occipital scalp 11/12/2013  . Macular degeneration 06/28/2013  . Macular degeneration (senile) of retina, unspecified   . Osteoporosis   . Other malaise and fatigue   . Other sleep disturbances   . Other specified cardiac dysrhythmias(427.89)   . Other specified personal history presenting hazards to health(V15.89)   . Other symptoms involving cardiovascular system   . Pancreatitis   . Personal history of other diseases of circulatory system   . Personal  history of other diseases of digestive system    pancreatitis from gallstones  . Personal history of other diseases of respiratory system   . Personal history of traumatic fracture   . Rosacea   . Senile osteoporosis    with old T11 fracture  . Thyroid disease   . Unspecified disorders of arteries and arterioles   . Unspecified hearing loss   . Unspecified hemorrhoids without mention of complication    Past Surgical History:  Procedure Laterality Date  .  ABDOMINAL HYSTERECTOMY     and BSO fibroids  . COLONOSCOPY  05/23/2007  . EYE LID LIFT Right 09/2016   OD  . HIP ARTHROPLASTY Left 10/31/2016   Procedure: ARTHROPLASTY  HIP (HEMIARTHROPLASTY);  Surgeon: Paralee Cancel, MD;  Location: [facility];  Service: Orthopedics;  Laterality: Left;  . JOINT REPLACEMENT  1996  . TOTAL HIP ARTHROPLASTY Right     Allergies  Allergen Reactions  . Ace Inhibitors Cough  . Mirtazapine Other (See Comments)    Nightmares   . Penicillins Rash    Has patient had a PCN reaction causing immediate rash, facial/tongue/throat swelling, SOB or lightheadedness with hypotension: Yes Has patient had a PCN reaction causing severe rash involving mucus membranes or skin necrosis: Unknown Has patient had a PCN reaction that required hospitalization: Unknown Has patient had a PCN reaction occurring within the last 10 years: Unknown If all of the above answers are "NO", then may proceed with Cephalosporin use.     Allergies as of 08/19/2017      Reactions   Ace Inhibitors Cough   Mirtazapine Other (See Comments)   Nightmares   Penicillins Rash   Has patient had a PCN reaction causing immediate rash, facial/tongue/throat swelling, SOB or lightheadedness with hypotension: Yes Has patient had a PCN reaction causing severe rash involving mucus membranes or skin necrosis: Unknown Has patient had a PCN reaction that required hospitalization: Unknown Has patient had a PCN reaction occurring within the last 10 years: Unknown If all of the above answers are "NO", then may proceed with Cephalosporin use.      Medication List        Accurate as of 08/19/17 11:59 PM. Always use your most recent med list.          acetaminophen 500 MG tablet Commonly known as:  TYLENOL Take 1,000 mg by mouth every 8 (eight) hours as needed for mild pain.   alum & mag hydroxide-simeth 200-200-20 MG/5ML suspension Commonly known as:  MAALOX/MYLANTA Take 30 mLs by mouth every 4 (four) hours as  needed for indigestion.   ASPERCREME LIDOCAINE 4 % Ptch Generic drug:  Lidocaine Apply 1 patch topically at bedtime. APPLY TO LOWER BACK AND REMOVE IN THE MORNING   BENECALORIE PO Take 1.5 oz by mouth 2 (two) times daily.   Cholecalciferol 2000 units Tabs Take 1 tablet (2,000 Units total) by mouth daily.   clindamycin 150 MG capsule Commonly known as:  CLEOCIN Take 600 mg by mouth See admin instructions. ONE HOUR PRIOR TO DENTAL APPOINTMENTS   diltiazem 120 MG 24 hr capsule Commonly known as:  CARDIZEM CD Take 120 mg by mouth daily.   escitalopram 10 MG tablet Commonly known as:  LEXAPRO Take 20 mg by mouth daily.   hydrocortisone cream 1 % Apply 1 application topically 2 (two) times daily. TO HEMORRHOIDS   levothyroxine 25 MCG tablet Commonly known as:  SYNTHROID, LEVOTHROID Take 25 mcg by mouth daily before breakfast.   loratadine 10  MG tablet Commonly known as:  CLARITIN Take 10 mg by mouth daily as needed for allergies.   NAMENDA XR 28 MG Cp24 24 hr capsule Generic drug:  memantine Take 28 mg by mouth.   nebivolol 2.5 MG tablet Commonly known as:  BYSTOLIC Take 2.5 mg by mouth daily.   ondansetron 4 MG tablet Commonly known as:  ZOFRAN Take 1 tablet (4 mg total) by mouth every 6 (six) hours as needed for nausea.   polyethylene glycol packet Commonly known as:  MIRALAX / GLYCOLAX Take 17 g by mouth every other day. For constipation and hold for loose stools   polyvinyl alcohol 1.4 % ophthalmic solution Commonly known as:  LIQUIFILM TEARS 1 drop as needed for dry eyes.   warfarin 3 MG tablet Commonly known as:  COUMADIN Take 3 mg by mouth daily. Medication is on hold for eye surgery scheduled for 07/27/2017.      ROS was provided with assistance of staff Review of Systems  Constitutional: Negative for activity change, appetite change, chills, diaphoresis, fatigue and fever.  HENT: Positive for hearing loss. Negative for congestion.   Respiratory:  Negative for cough and shortness of breath.   Cardiovascular: Negative for chest pain, palpitations and leg swelling.  Genitourinary: Negative for difficulty urinating, dysuria and urgency.       Incontinent of urine.   Musculoskeletal: Positive for gait problem.  Neurological: Negative for dizziness, facial asymmetry, speech difficulty and weakness.       Dementia  Psychiatric/Behavioral: Positive for agitation, behavioral problems and confusion. Negative for hallucinations and sleep disturbance. The patient is nervous/anxious.     Immunization History  Administered Date(s) Administered  . DTaP 10/12/2012  . Influenza-Unspecified 11/22/2012, 12/26/2013, 11/12/2014, 12/09/2015, 12/14/2016  . PPD Test 12/06/2013  . Pneumococcal Conjugate-13 11/05/2016  . Pneumococcal Polysaccharide-23 09/22/2000  . Td 07/08/2005  . Tdap 11/10/2013   Pertinent  Health Maintenance Due  Topic Date Due  . INFLUENZA VACCINE  09/22/2017  . DEXA SCAN  Completed  . PNA vac Low Risk Adult  Completed   Fall Risk  10/22/2016 01/13/2015 06/28/2013  Falls in the past year? No No No  Risk for fall due to : - Impaired balance/gait -   Functional Status Survey:    Vitals:   08/19/17 1006  BP: 140/70  Pulse: 70  Resp: 20   There is no height or weight on file to calculate BMI. Physical Exam  Constitutional: She appears well-developed and well-nourished. No distress.  HENT:  Head: Normocephalic and atraumatic.  Eyes: Pupils are equal, round, and reactive to light. EOM are normal.  Neck: Normal range of motion. Neck supple. No JVD present. No thyromegaly present.  Cardiovascular: Normal rate.  Murmur heard. Irregular heart beats.   Pulmonary/Chest: Effort normal. She has no wheezes. She has no rales.  Abdominal: Soft. Bowel sounds are normal.  Musculoskeletal: She exhibits no edema.  One person transfer, w/c for mobility.   Neurological: She is alert.  Oriented to self.   Skin: Skin is warm and dry.  She is not diaphoretic.  Psychiatric: She has a normal mood and affect. Her behavior is normal.  Upon my visit.     Labs reviewed: Recent Labs    10/31/16 0236 11/01/16 0353 11/02/16 0445  01/04/17 05/19/17 06/15/17 06/16/17 0819  NA 140 140 139   < > 141 142 142  --   K 3.7 3.8 3.9   < > 3.8 4.1 4.7  --   CL 112*  113* 112*  --   --   --   --   --   CO2 21* 21* 20*  --   --   --   --  29  GLUCOSE 108* 149* 110*  --   --   --   --   --   BUN 27* 32* 24*   < > 28* 39* 32*  --   CREATININE 0.90 0.90 0.80   < > 0.8 1.2* 1.1  --   CALCIUM 8.2* 8.3* 8.3*  --   --   --   --   --    < > = values in this interval not displayed.   Recent Labs    10/28/16 0329 01/04/17 05/19/17 06/15/17  AST  --  17 16 16   ALT  --  10 8 8   ALKPHOS  --  75 70 76  ALBUMIN 3.0*  --   --   --    Recent Labs    10/27/16 1715  11/01/16 0353 11/02/16 0445 11/03/16 0318  01/04/17 05/19/17 06/15/17  WBC 9.6   < > 6.0 7.4 8.4   < > 5.5 6.2 3.7  NEUTROABS 8.4*  --   --   --   --   --   --   --   --   HGB 12.4   < > 10.0* 9.5* 10.3*   < > 11.3* 9.8* 10.7*  HCT 39.1   < > 31.2* 29.8* 32.6*   < > 33* 30* 32*  MCV 94.7   < > 94.8 96.1 95.0  --   --   --   --   PLT 174   < > 179 163 197   < > 254 242 229   < > = values in this interval not displayed.   Lab Results  Component Value Date   TSH 1.32 05/19/2017   No results found for: HGBA1C No results found for: CHOL, HDL, LDLCALC, LDLDIRECT, TRIG, CHOLHDL  Significant Diagnostic Results in last 30 days:  No results found.  Assessment/Plan: Depression with anxiety Emotional outburst, better than prior, continue Lexapro 20mg  daily, have Lorazepam 0.5mg  bid with at least 2 hours apart between doses x 14 days, update CBC/diff, CMP, TSH. VS qshiftx 72 hours.   Senile dementia Continue SNF [facility] for care assistance, continue Namenda for memory  Hypothyroidism Last TSH 1.32 05/19/17, update TSH, continue Levothyroxine 20mcg qd.     Family/ staff  Communication: plan of care reviewed with the patient and charge nurse.   Labs/tests ordered:  CBC/diff, CMP, TSH  Time spend 25 minute.

## 2017-08-19 NOTE — Assessment & Plan Note (Signed)
Continue SNF [facility] for care assistance, continue Namenda for memory

## 2017-08-19 NOTE — Assessment & Plan Note (Signed)
Last TSH 1.32 05/19/17, update TSH, continue Levothyroxine 31mcg qd.

## 2017-08-22 ENCOUNTER — Encounter: Payer: Self-pay | Admitting: Nurse Practitioner

## 2017-08-23 DIAGNOSIS — R946 Abnormal results of thyroid function studies: Secondary | ICD-10-CM | POA: Diagnosis not present

## 2017-08-23 DIAGNOSIS — R7989 Other specified abnormal findings of blood chemistry: Secondary | ICD-10-CM | POA: Diagnosis not present

## 2017-08-23 DIAGNOSIS — R6889 Other general symptoms and signs: Secondary | ICD-10-CM | POA: Diagnosis not present

## 2017-08-23 DIAGNOSIS — I482 Chronic atrial fibrillation: Secondary | ICD-10-CM | POA: Diagnosis not present

## 2017-08-23 LAB — CBC AND DIFFERENTIAL
HCT: 38 (ref 36–46)
HEMOGLOBIN: 9.2 — AB (ref 12.0–16.0)
Platelets: 198 (ref 150–399)
WBC: 4.5

## 2017-08-23 LAB — HEPATIC FUNCTION PANEL
ALK PHOS: 68 (ref 25–125)
ALT: 6 — AB (ref 7–35)
AST: 14 (ref 13–35)
BILIRUBIN, TOTAL: 0.3

## 2017-08-23 LAB — TSH: TSH: 0.9 (ref <=5.90)

## 2017-08-23 LAB — BASIC METABOLIC PANEL
BUN: 37 — AB (ref 4–21)
Creatinine: 1 (ref <=1.1)
Glucose: 84
POTASSIUM: 4.1 (ref 3.4–5.3)
SODIUM: 144 (ref 137–147)

## 2017-08-24 ENCOUNTER — Other Ambulatory Visit: Payer: Self-pay | Admitting: *Deleted

## 2017-08-24 LAB — COMPLETE METABOLIC PANEL WITH GFR
ALBUMIN: 3
Calcium: 9
Carbon Dioxide, Total: 28
Chloride: 11
EGFR (Non-African Amer.): 47
GLOBULIN: 2.4
PROTEIN: 5.4

## 2017-08-26 DIAGNOSIS — Z85828 Personal history of other malignant neoplasm of skin: Secondary | ICD-10-CM | POA: Diagnosis not present

## 2017-08-26 DIAGNOSIS — L570 Actinic keratosis: Secondary | ICD-10-CM | POA: Diagnosis not present

## 2017-08-26 DIAGNOSIS — D0439 Carcinoma in situ of skin of other parts of face: Secondary | ICD-10-CM | POA: Diagnosis not present

## 2017-08-30 DIAGNOSIS — I480 Paroxysmal atrial fibrillation: Secondary | ICD-10-CM | POA: Diagnosis not present

## 2017-08-30 DIAGNOSIS — D638 Anemia in other chronic diseases classified elsewhere: Secondary | ICD-10-CM | POA: Diagnosis not present

## 2017-08-30 LAB — CBC AND DIFFERENTIAL
HEMATOCRIT: 32 — AB (ref 36–46)
HEMOGLOBIN: 10.3 — AB (ref 12.0–16.0)
Platelets: 210 (ref 150–399)
WBC: 5.5

## 2017-08-30 LAB — VITAMIN B12: Vitamin B-12: 206

## 2017-09-01 ENCOUNTER — Encounter: Payer: Self-pay | Admitting: Internal Medicine

## 2017-09-01 ENCOUNTER — Non-Acute Institutional Stay (SKILLED_NURSING_FACILITY): Payer: PPO | Admitting: Internal Medicine

## 2017-09-01 DIAGNOSIS — M25511 Pain in right shoulder: Secondary | ICD-10-CM

## 2017-09-01 DIAGNOSIS — F0391 Unspecified dementia with behavioral disturbance: Secondary | ICD-10-CM | POA: Diagnosis not present

## 2017-09-01 DIAGNOSIS — W19XXXA Unspecified fall, initial encounter: Secondary | ICD-10-CM

## 2017-09-01 DIAGNOSIS — K5900 Constipation, unspecified: Secondary | ICD-10-CM

## 2017-09-01 DIAGNOSIS — S61412D Laceration without foreign body of left hand, subsequent encounter: Secondary | ICD-10-CM | POA: Diagnosis not present

## 2017-09-01 DIAGNOSIS — F03918 Unspecified dementia, unspecified severity, with other behavioral disturbance: Secondary | ICD-10-CM

## 2017-09-01 DIAGNOSIS — M79601 Pain in right arm: Secondary | ICD-10-CM | POA: Diagnosis not present

## 2017-09-01 LAB — IRON,TIBC AND FERRITIN PANEL
FERRITIN: 102
RETICULOCYTE: 1.2
Retic Ct Abs: 40560

## 2017-09-01 LAB — FOLATE: Folate, RBC: 650

## 2017-09-01 NOTE — Progress Notes (Signed)
Location:  [facility] Room Number: 44 Place of Service:  SNF ([phone]) Provider:  Blanchie Serve, MD  Blanchie Serve, MD  Patient Care Team: Blanchie Serve, MD as PCP - General (Internal Medicine) Mast, Man X, NP as Nurse Practitioner (Nurse Practitioner) Wilford Corner, MD as Consulting Physician (Gastroenterology) Sanda Klein, MD as Consulting Physician (Cardiology)  Extended Emergency Contact Information Primary Emergency Contact: [name],Pat Address: [place] [address] [name] of [place] Phone: [phone] Mobile Phone: [phone] Relation: Daughter  Code Status:  DNR  Goals of care: Advanced Directive information Advanced Directives 09/01/2017  Does Patient Have a Medical Advance Directive? Yes  Type of Paramedic of [place];Out of facility DNR (pink MOST or yellow form)  Does patient want to make changes to medical advance directive? No - Patient declined  Copy of [place] in Chart? Yes  Pre-existing out of facility DNR order (yellow form or pink MOST form) Yellow form placed in chart (order not valid for inpatient use);Pink MOST form placed in chart (order not valid for inpatient use)     Chief Complaint  Patient presents with  . Acute Visit    Fall, right shoulder pain, skin concerns and no bowel movement     HPI:  Pt is a 84 y.o. female seen today for an acute visit for multiple issue. Per nursing, she had a fall yesterday and then started complaining of pain to right arm and shoulder. She also sustained skin tear to left hand from it. Nurses have documented concern about her constipation. Had a bowel movement yesterday but she is having trouble moving her bowel. Appetite is fair- mainly finger food. Staff have also noticed increased agitation/ confusion mainly in afternoon time. Family have been approached by nursing and they would like ativan to be increased from  bid as needed to tid prn. Patient seen today with charge nurse present. She is pleasantly confused but participates some in conversation.    Past Medical History:  Diagnosis Date  . Anemia, unspecified   . Anorexia   . Anxiety   . Arthritis   . Atherosclerotic cerebrovascular disease   . Backache, unspecified   . Cataract   . Cholelithiasis   . Chronic kidney disease, unspecified   . Closed fracture of right inferior pubic ramus ([place]) 11/12/2013   11/10/13 s/p fall, ED eval  X-ray R hip  1. Possible nondisplaced right inferior pubic ramus fracture.  2. No femur fracture or dislocation   02/06/14 dc prn Motrin and Norco-not used    . Depression   . Diaphragmatic hernia without mention of obstruction or gangrene   . Diseases of lips 03/26/2013  . Diverticulosis of colon (without mention of hemorrhage)   . Hemorrhage of rectum and anus 03/26/2013  . Hyperlipidemia   . Hypertension   . Insomnia, unspecified   . Laceration of occipital scalp 11/12/2013  . Macular degeneration 06/28/2013  . Macular degeneration (senile) of retina, unspecified   . Osteoporosis   . Other malaise and fatigue   . Other sleep disturbances   . Other specified cardiac dysrhythmias(427.89)   . Other specified personal history presenting hazards to health(V15.89)   . Other symptoms involving cardiovascular system   . Pancreatitis   . Personal history of other diseases of circulatory system   . Personal history of other diseases of digestive system    pancreatitis from gallstones  . Personal  history of other diseases of respiratory system   . Personal history of traumatic fracture   . Rosacea   . Senile osteoporosis    with old T11 fracture  . Thyroid disease   . Unspecified disorders of arteries and arterioles   . Unspecified hearing loss   . Unspecified hemorrhoids without mention of complication    Past Surgical History:  Procedure Laterality Date  . ABDOMINAL HYSTERECTOMY     and BSO fibroids  .  COLONOSCOPY  05/23/2007  . EYE LID LIFT Right 09/2016   OD  . HIP ARTHROPLASTY Left 10/31/2016   Procedure: ARTHROPLASTY  HIP (HEMIARTHROPLASTY);  Surgeon: Paralee Cancel, MD;  Location: [facility];  Service: Orthopedics;  Laterality: Left;  . JOINT REPLACEMENT  1996  . TOTAL HIP ARTHROPLASTY Right     Allergies  Allergen Reactions  . Ace Inhibitors Cough  . Mirtazapine Other (See Comments)    Nightmares   . Penicillins Rash    Has patient had a PCN reaction causing immediate rash, facial/tongue/throat swelling, SOB or lightheadedness with hypotension: Yes Has patient had a PCN reaction causing severe rash involving mucus membranes or skin necrosis: Unknown Has patient had a PCN reaction that required hospitalization: Unknown Has patient had a PCN reaction occurring within the last 10 years: Unknown If all of the above answers are "NO", then may proceed with Cephalosporin use.     Outpatient Encounter Medications as of 09/01/2017  Medication Sig  . acetaminophen (TYLENOL) 500 MG tablet Take 1,000 mg by mouth every 8 (eight) hours as needed for mild pain.  Marland Kitchen alum & mag hydroxide-simeth (MAALOX/MYLANTA) 200-200-20 MG/5ML suspension Take 30 mLs by mouth every 4 (four) hours as needed for indigestion.  . cholecalciferol 2000 units TABS Take 1 tablet (2,000 Units total) by mouth daily.  . clindamycin (CLEOCIN) 150 MG capsule Take 600 mg by mouth See admin instructions. ONE HOUR PRIOR TO DENTAL APPOINTMENTS  . diltiazem (CARDIZEM CD) 120 MG 24 hr capsule Take 120 mg by mouth daily.  Marland Kitchen escitalopram (LEXAPRO) 10 MG tablet Take 20 mg by mouth daily.   . hydrocortisone cream 1 % Apply 1 application topically 2 (two) times daily. TO HEMORRHOIDS  . hydroxypropyl methylcellulose / hypromellose (ISOPTO TEARS / GONIOVISC) 2.5 % ophthalmic solution Place 1 drop into both eyes 4 (four) times daily.  Marland Kitchen levothyroxine (SYNTHROID, LEVOTHROID) 25 MCG tablet Take 25 mcg by mouth daily before breakfast.   .  Lidocaine (ASPERCREME LIDOCAINE) 4 % PTCH Apply 1 patch topically at bedtime. APPLY TO LOWER BACK AND REMOVE IN THE MORNING  . loratadine (CLARITIN) 10 MG tablet Take 10 mg by mouth daily.   Marland Kitchen LORazepam (ATIVAN) 0.5 MG tablet Take 0.5 mg by mouth 2 (two) times daily as needed for anxiety. Stop date 09/02/17  . memantine (NAMENDA XR) 28 MG CP24 24 hr capsule Take 28 mg by mouth.   . nebivolol (BYSTOLIC) 2.5 MG tablet Take 2.5 mg by mouth daily.  . Nutritional Supplements (BENECALORIE PO) Take 1.5 oz by mouth 2 (two) times daily.   . ondansetron (ZOFRAN) 4 MG tablet Take 1 tablet (4 mg total) by mouth every 6 (six) hours as needed for nausea.  . polyethylene glycol (MIRALAX / GLYCOLAX) packet Take 17 g by mouth every other day. For constipation and hold for loose stools  . warfarin (COUMADIN) 4 MG tablet Take 4 mg by mouth daily.  . [DISCONTINUED] polyvinyl alcohol (LIQUIFILM TEARS) 1.4 % ophthalmic solution 1 drop as needed for dry  eyes.  . [DISCONTINUED] warfarin (COUMADIN) 3 MG tablet Take 3 mg by mouth daily. Medication is on hold for eye surgery scheduled for 07/27/2017.   No facility-administered encounter medications on file as of 09/01/2017.     Review of Systems  Unable to perform ROS: Dementia (limited)  Constitutional: Negative for appetite change and fever.  HENT: Negative for congestion.   Respiratory: Negative for cough and shortness of breath.   Cardiovascular: Negative for chest pain.  Gastrointestinal: Positive for constipation. Negative for abdominal pain, blood in stool, diarrhea and vomiting.  Genitourinary:       Incontinent with bowel and baldder  Musculoskeletal: Positive for gait problem.  Neurological: Negative for dizziness and headaches.  Hematological: Bruises/bleeds easily.  Psychiatric/Behavioral: Positive for agitation, behavioral problems and confusion.    Immunization History  Administered Date(s) Administered  . DTaP 10/12/2012  . Influenza-Unspecified  11/22/2012, 12/26/2013, 11/12/2014, 12/09/2015, 12/14/2016  . PPD Test 12/06/2013  . Pneumococcal Conjugate-13 11/05/2016  . Pneumococcal Polysaccharide-23 09/22/2000  . Td 07/08/2005  . Tdap 11/10/2013   Pertinent  Health Maintenance Due  Topic Date Due  . INFLUENZA VACCINE  09/22/2017  . DEXA SCAN  Completed  . PNA vac Low Risk Adult  Completed   Fall Risk  10/22/2016 01/13/2015 06/28/2013  Falls in the past year? No No No  Risk for fall due to : - Impaired balance/gait -   Functional Status Survey:    Vitals:   09/01/17 1241  BP: (!) 160/80  Pulse: 82  Resp: 20  Temp: 97.8 F (36.6 C)  TempSrc: Oral  SpO2: 95%  Weight: 113 lb 11.2 oz (51.6 kg)  Height: 5' (1.524 m)   Body mass index is 22.21 kg/m. Physical Exam  Constitutional: No distress.  Elderly frail female   HENT:  Head: Normocephalic and atraumatic.  Mouth/Throat: Oropharynx is clear and moist.  Eyes: Conjunctivae are normal. Right eye exhibits no discharge. Left eye exhibits no discharge.  Neck: Normal range of motion. Neck supple.  Cardiovascular: Normal rate and regular rhythm.  Pulmonary/Chest: Effort normal and breath sounds normal. No respiratory distress. She has no wheezes. She has no rales.  Abdominal: Soft. Bowel sounds are normal. There is no tenderness.  Musculoskeletal: She exhibits no edema.  Can move all 4 extremities, holding right arm close to chest during abduction from pain  Lymphadenopathy:    She has no cervical adenopathy.  Neurological: She is alert. She exhibits normal muscle tone.  Oriented only to self  Skin: Skin is warm and dry. She is not diaphoretic.  Skin tear left hand with steri-strip  Psychiatric:  Pleasantly confused    Labs reviewed: Recent Labs    10/31/16 0236 11/01/16 0353 11/02/16 0445  05/19/17 06/15/17 06/16/17 0819 08/23/17  NA 140 140 139   < > 142 142  --  144  K 3.7 3.8 3.9   < > 4.1 4.7  --  4.1  CL 112* 113* 112*  --   --   --   --  11  CO2 21*  21* 20*  --   --   --  29 28  GLUCOSE 108* 149* 110*  --   --   --   --   --   BUN 27* 32* 24*   < > 39* 32*  --  37*  CREATININE 0.90 0.90 0.80   < > 1.2* 1.1  --  1.0  CALCIUM 8.2* 8.3* 8.3*  --   --   --   --  9.0   < > = values in this interval not displayed.   Recent Labs    10/28/16 0329  05/19/17 06/15/17 08/23/17  AST  --    < > 16 16 14   ALT  --    < > 8 8 6*  ALKPHOS  --    < > 70 76 68  ALBUMIN 3.0*  --   --   --  3.0   < > = values in this interval not displayed.   Recent Labs    10/27/16 1715  11/01/16 0353 11/02/16 0445 11/03/16 0318  06/15/17 08/23/17 08/30/17  WBC 9.6   < > 6.0 7.4 8.4   < > 3.7 4.5 5.5  NEUTROABS 8.4*  --   --   --   --   --   --   --   --   HGB 12.4   < > 10.0* 9.5* 10.3*   < > 10.7* 9.2* 10.3*  HCT 39.1   < > 31.2* 29.8* 32.6*   < > 32* 38 32*  MCV 94.7   < > 94.8 96.1 95.0  --   --   --   --   PLT 174   < > 179 163 197   < > 229 198 210   < > = values in this interval not displayed.   Lab Results  Component Value Date   TSH 0.90 08/23/2017   No results found for: HGBA1C No results found for: CHOL, HDL, LDLCALC, LDLDIRECT, TRIG, CHOLHDL  Significant Diagnostic Results in last 30 days:  No results found.   09/01/17 xray humerus and shoulder right- no acute fracture or dislocation. Mild osteoarthritis changes and mild osteopenia noted  Assessment/Plan  1. Constipation, unspecified constipation type Currently on miralax qod. Add senokot s daily and monitor. Encourage and maintain hydration  2. Fall, initial encounter Continue neuro checks. On warfarin, high fall risk, will need to review need to continue anticoagulation with family with risk vs benefit. Fall precautions.   3. Acute pain of right shoulder Post fall, xray negative for fracture. Mild OA and osteopenia noted. On tylenol 1000 mg tid prn. Change this to 1000 mg bid with 500 mg po q8h if needed for 2 weeks.   4. Skin tear of left hand without complication, subsequent  encounter Has steri strip in place, skin care, fall precautions.   5. Senile dementia with behavioral disturbance Continue memantine and escitalopram. Also on lorazepam 0.5 mg bid prn, change this to tid prn. Add seroquel 12.5 mg at 2 pm for now to help with her symptoms from sundowning.    Family/ staff Communication: reviewed care plan with patient and charge nurse.    Labs/tests ordered:  none  Blanchie Serve, MD Internal Medicine [facility] [address] [place],  [place] [address] Cell Phone (Monday-Friday 8 am - 5 pm): [phone] On Call: [phone] and follow prompts after 5 pm and on weekends Office Phone: [phone] Office Fax: [phone]

## 2017-09-06 DIAGNOSIS — I482 Chronic atrial fibrillation: Secondary | ICD-10-CM | POA: Diagnosis not present

## 2017-09-09 ENCOUNTER — Encounter: Payer: Self-pay | Admitting: Internal Medicine

## 2017-09-09 ENCOUNTER — Non-Acute Institutional Stay (SKILLED_NURSING_FACILITY): Payer: PPO | Admitting: Internal Medicine

## 2017-09-09 DIAGNOSIS — E038 Other specified hypothyroidism: Secondary | ICD-10-CM | POA: Diagnosis not present

## 2017-09-09 DIAGNOSIS — I10 Essential (primary) hypertension: Secondary | ICD-10-CM | POA: Diagnosis not present

## 2017-09-09 DIAGNOSIS — E063 Autoimmune thyroiditis: Secondary | ICD-10-CM | POA: Diagnosis not present

## 2017-09-09 DIAGNOSIS — K5909 Other constipation: Secondary | ICD-10-CM | POA: Diagnosis not present

## 2017-09-09 DIAGNOSIS — I509 Heart failure, unspecified: Secondary | ICD-10-CM | POA: Diagnosis not present

## 2017-09-09 NOTE — Progress Notes (Signed)
Location:  [facility] Room Number: 33 Place of Service:  SNF [phone]) Provider:  Blanchie Serve MD  Blanchie Serve, MD  Patient Care Team: Blanchie Serve, MD as PCP - General (Internal Medicine) Mast, Man X, NP as Nurse Practitioner (Nurse Practitioner) Wilford Corner, MD as Consulting Physician (Gastroenterology) Sanda Klein, MD as Consulting Physician (Cardiology)  Extended Emergency Contact Information Primary Emergency Contact: [name],Pat Address: [place] [address] [name] of [place] Phone: [phone] Mobile Phone: [phone] Relation: Daughter  Code Status:  DNR  Goals of care: Advanced Directive information Advanced Directives 09/09/2017  Does Patient Have a Medical Advance Directive? Yes  Type of Paramedic of [place];Out of facility DNR (pink MOST or yellow form)  Does patient want to make changes to medical advance directive? No - Patient declined  Copy of [place] in Chart? Yes  Pre-existing out of facility DNR order (yellow form or pink MOST form) Yellow form placed in chart (order not valid for inpatient use);Pink MOST form placed in chart (order not valid for inpatient use)     Chief Complaint  Patient presents with  . Medical Management of Chronic Issues    Routine Visit     HPI:  Pt is a 84 y.o. female seen today for medical management of chronic diseases.  She does not participate in HPI and ROS. She has advanced dementia. There are episodes of behavioral issues. She is under total care from nursing. She takes her medication. No acute nursing concern. Last fall 08/31/17.   Hypertension- elevated BP reading on review. Takes nebivolol 2.5 mg daily  Hypothyroidism- taking levothyroxineTaking warfarin, no bleed reported, tolerating well  Constipation- currently on senokot s daily and miralax every other day  Dementia with behavioral disturbance-  taking seroquel 12.5 mg daily at 2 pm. Also on daily namenda  Past Medical History:  Diagnosis Date  . Anemia, unspecified   . Anorexia   . Anxiety   . Arthritis   . Atherosclerotic cerebrovascular disease   . Backache, unspecified   . Cataract   . Cholelithiasis   . Chronic kidney disease, unspecified   . Closed fracture of right inferior pubic ramus ([place]) 11/12/2013   11/10/13 s/p fall, ED eval  X-ray R hip  1. Possible nondisplaced right inferior pubic ramus fracture.  2. No femur fracture or dislocation   02/06/14 dc prn Motrin and Norco-not used    . Depression   . Diaphragmatic hernia without mention of obstruction or gangrene   . Diseases of lips 03/26/2013  . Diverticulosis of colon (without mention of hemorrhage)   . Hemorrhage of rectum and anus 03/26/2013  . Hyperlipidemia   . Hypertension   . Insomnia, unspecified   . Laceration of occipital scalp 11/12/2013  . Macular degeneration 06/28/2013  . Macular degeneration (senile) of retina, unspecified   . Osteoporosis   . Other malaise and fatigue   . Other sleep disturbances   . Other specified cardiac dysrhythmias(427.89)   . Other specified personal history presenting hazards to health(V15.89)   . Other symptoms involving cardiovascular system   . Pancreatitis   . Personal history of other diseases of circulatory system   . Personal history of other diseases of digestive system    pancreatitis from gallstones  . Personal history of other diseases of respiratory system   . Personal history of traumatic fracture   . Rosacea   .  Senile osteoporosis    with old T11 fracture  . Thyroid disease   . Unspecified disorders of arteries and arterioles   . Unspecified hearing loss   . Unspecified hemorrhoids without mention of complication    Past Surgical History:  Procedure Laterality Date  . ABDOMINAL HYSTERECTOMY     and BSO fibroids  . COLONOSCOPY  05/23/2007  . EYE LID LIFT Right 09/2016   OD  . HIP ARTHROPLASTY Left  10/31/2016   Procedure: ARTHROPLASTY  HIP (HEMIARTHROPLASTY);  Surgeon: Paralee Cancel, MD;  Location: [facility];  Service: Orthopedics;  Laterality: Left;  . JOINT REPLACEMENT  1996  . TOTAL HIP ARTHROPLASTY Right     Allergies  Allergen Reactions  . Ace Inhibitors Cough  . Mirtazapine Other (See Comments)    Nightmares   . Penicillins Rash    Has patient had a PCN reaction causing immediate rash, facial/tongue/throat swelling, SOB or lightheadedness with hypotension: Yes Has patient had a PCN reaction causing severe rash involving mucus membranes or skin necrosis: Unknown Has patient had a PCN reaction that required hospitalization: Unknown Has patient had a PCN reaction occurring within the last 10 years: Unknown If all of the above answers are "NO", then may proceed with Cephalosporin use.     Outpatient Encounter Medications as of 09/09/2017  Medication Sig  . acetaminophen (TYLENOL) 500 MG tablet Take 1,000 mg by mouth 2 (two) times daily.   Marland Kitchen acetaminophen (TYLENOL) 500 MG tablet Take 500 mg by mouth every 8 (eight) hours as needed.  Marland Kitchen alum & mag hydroxide-simeth (MAALOX/MYLANTA) 200-200-20 MG/5ML suspension Take 30 mLs by mouth every 4 (four) hours as needed for indigestion.  . cholecalciferol 2000 units TABS Take 1 tablet (2,000 Units total) by mouth daily.  . clindamycin (CLEOCIN) 150 MG capsule Take 600 mg by mouth See admin instructions. ONE HOUR PRIOR TO DENTAL APPOINTMENTS  . diltiazem (CARDIZEM CD) 120 MG 24 hr capsule Take 120 mg by mouth daily.  Marland Kitchen escitalopram (LEXAPRO) 10 MG tablet Take 20 mg by mouth daily.   . hydrocortisone cream 1 % Apply 1 application topically 2 (two) times daily. TO HEMORRHOIDS  . hydroxypropyl methylcellulose / hypromellose (ISOPTO TEARS / GONIOVISC) 2.5 % ophthalmic solution Place 1 drop into both eyes 4 (four) times daily.  Marland Kitchen levothyroxine (SYNTHROID, LEVOTHROID) 25 MCG tablet Take 25 mcg by mouth daily before breakfast.   . Lidocaine (ASPERCREME  LIDOCAINE) 4 % PTCH Apply 1 patch topically at bedtime. APPLY TO LOWER BACK AND REMOVE IN THE MORNING  . loratadine (CLARITIN) 10 MG tablet Take 10 mg by mouth daily.   Marland Kitchen LORazepam (ATIVAN) 0.5 MG tablet Take 0.5 mg by mouth every 8 (eight) hours as needed for anxiety.   . memantine (NAMENDA XR) 28 MG CP24 24 hr capsule Take 28 mg by mouth.   . nebivolol (BYSTOLIC) 2.5 MG tablet Take 2.5 mg by mouth daily.  . Nutritional Supplements (BENECALORIE PO) Take 1.5 oz by mouth 2 (two) times daily.   . ondansetron (ZOFRAN) 4 MG tablet Take 1 tablet (4 mg total) by mouth every 6 (six) hours as needed for nausea.  . polyethylene glycol (MIRALAX / GLYCOLAX) packet Take 17 g by mouth every other day. For constipation and hold for loose stools  . QUEtiapine (SEROQUEL) 25 MG tablet Take 12.5 mg by mouth daily at 2 PM.  . sennosides-docusate sodium (SENOKOT-S) 8.6-50 MG tablet Take 1 tablet by mouth daily.  Marland Kitchen warfarin (COUMADIN) 4 MG tablet Take 4 mg  by mouth daily.   No facility-administered encounter medications on file as of 09/09/2017.     Review of Systems  Unable to perform ROS: Dementia    Immunization History  Administered Date(s) Administered  . DTaP 10/12/2012  . Influenza-Unspecified 11/22/2012, 12/26/2013, 11/12/2014, 12/09/2015, 12/14/2016  . PPD Test 12/06/2013  . Pneumococcal Conjugate-13 11/05/2016  . Pneumococcal Polysaccharide-23 09/22/2000  . Td 07/08/2005  . Tdap 11/10/2013   Pertinent  Health Maintenance Due  Topic Date Due  . INFLUENZA VACCINE  09/22/2017  . DEXA SCAN  Completed  . PNA vac Low Risk Adult  Completed   Fall Risk  10/22/2016 01/13/2015 06/28/2013  Falls in the past year? No No No  Risk for fall due to : - Impaired balance/gait -   Functional Status Survey:    Vitals:   09/09/17 1223  BP: 140/60  Pulse: 68  Resp: 20  Temp: 97.8 F (36.6 C)  TempSrc: Oral  SpO2: 95%  Weight: 113 lb 11.2 oz (51.6 kg)  Height: 5' (1.524 m)   Body mass index is 22.21  kg/m.   Wt Readings from Last 3 Encounters:  09/09/17 113 lb 11.2 oz (51.6 kg)  09/01/17 113 lb 11.2 oz (51.6 kg)  08/11/17 112 lb 3.2 oz (50.9 kg)   Physical Exam  Constitutional: No distress.  Frail, elderly female  HENT:  Head: Normocephalic and atraumatic.  Right Ear: External ear normal.  Left Ear: External ear normal.  Nose: Nose normal.  Mouth/Throat: Oropharynx is clear and moist.  Eyes: Pupils are equal, round, and reactive to light. Conjunctivae and EOM are normal. Right eye exhibits no discharge. Left eye exhibits no discharge.  Neck: Normal range of motion. Neck supple.  Cardiovascular:  Murmur heard. Irregular heart rate  Pulmonary/Chest: Effort normal and breath sounds normal. She has no wheezes. She has no rales.  Abdominal: Soft. Bowel sounds are normal. There is no tenderness. There is no guarding.  Musculoskeletal: She exhibits no edema.  Can move all 4 extremities, wheeled around on wheelchair  Neurological: She is alert.  Oriented only to self, pleasantly confused  Skin: Skin is warm and dry. She is not diaphoretic.    Labs reviewed: Recent Labs    10/31/16 0236 11/01/16 0353 11/02/16 0445  05/19/17 06/15/17 06/16/17 0819 08/23/17  NA 140 140 139   < > 142 142  --  144  K 3.7 3.8 3.9   < > 4.1 4.7  --  4.1  CL 112* 113* 112*  --   --   --   --  11  CO2 21* 21* 20*  --   --   --  29 28  GLUCOSE 108* 149* 110*  --   --   --   --   --   BUN 27* 32* 24*   < > 39* 32*  --  37*  CREATININE 0.90 0.90 0.80   < > 1.2* 1.1  --  1.0  CALCIUM 8.2* 8.3* 8.3*  --   --   --   --  9.0   < > = values in this interval not displayed.   Recent Labs    10/28/16 0329  05/19/17 06/15/17 08/23/17  AST  --    < > 16 16 14   ALT  --    < > 8 8 6*  ALKPHOS  --    < > 70 76 68  ALBUMIN 3.0*  --   --   --  3.0   < > =  values in this interval not displayed.   Recent Labs    10/27/16 1715  11/01/16 0353 11/02/16 0445 11/03/16 0318  06/15/17 08/23/17 08/30/17  WBC  9.6   < > 6.0 7.4 8.4   < > 3.7 4.5 5.5  NEUTROABS 8.4*  --   --   --   --   --   --   --   --   HGB 12.4   < > 10.0* 9.5* 10.3*   < > 10.7* 9.2* 10.3*  HCT 39.1   < > 31.2* 29.8* 32.6*   < > 32* 38 32*  MCV 94.7   < > 94.8 96.1 95.0  --   --   --   --   PLT 174   < > 179 163 197   < > 229 198 210   < > = values in this interval not displayed.   Lab Results  Component Value Date   TSH 0.90 08/23/2017   No results found for: HGBA1C No results found for: CHOL, HDL, LDLCALC, LDLDIRECT, TRIG, CHOLHDL  Significant Diagnostic Results in last 30 days:  No results found.  Assessment/Plan  1. Chronic congestive heart failure, unspecified heart failure type (HCC) Continue diltiazem and monitor.   2. Essential hypertension BP elevated. Increase nebivolol to 5 mg daily. Monitor BP reading.   3. Hypothyroidism due to Hashimoto's thyroiditis Lab Results  Component Value Date   TSH 0.90 08/23/2017  continue levothyroxine and monitor  4. Chronic constipation Continue senokot s and miralax current regimen   Family/ staff Communication: reviewed care plan with patient and charge nurse.    Labs/tests ordered:  none   Blanchie Serve, MD Internal Medicine [facility] [address] [place], [place] [address] Cell Phone (Monday-Friday 8 am - 5 pm): [phone] On Call: [phone] and follow prompts after 5 pm and on weekends Office Phone: [phone] Office Fax: [phone]

## 2017-09-20 DIAGNOSIS — I482 Chronic atrial fibrillation: Secondary | ICD-10-CM | POA: Diagnosis not present

## 2017-09-20 DIAGNOSIS — I481 Persistent atrial fibrillation: Secondary | ICD-10-CM | POA: Diagnosis not present

## 2017-09-27 DIAGNOSIS — I481 Persistent atrial fibrillation: Secondary | ICD-10-CM | POA: Diagnosis not present

## 2017-10-04 DIAGNOSIS — I482 Chronic atrial fibrillation: Secondary | ICD-10-CM | POA: Diagnosis not present

## 2017-10-05 ENCOUNTER — Encounter: Payer: Self-pay | Admitting: Internal Medicine

## 2017-10-05 DIAGNOSIS — I482 Chronic atrial fibrillation: Secondary | ICD-10-CM | POA: Diagnosis not present

## 2017-10-07 ENCOUNTER — Non-Acute Institutional Stay (SKILLED_NURSING_FACILITY): Payer: PPO | Admitting: Nurse Practitioner

## 2017-10-07 ENCOUNTER — Encounter: Payer: Self-pay | Admitting: Nurse Practitioner

## 2017-10-07 DIAGNOSIS — I480 Paroxysmal atrial fibrillation: Secondary | ICD-10-CM | POA: Diagnosis not present

## 2017-10-07 DIAGNOSIS — F0391 Unspecified dementia with behavioral disturbance: Secondary | ICD-10-CM

## 2017-10-07 DIAGNOSIS — F339 Major depressive disorder, recurrent, unspecified: Secondary | ICD-10-CM

## 2017-10-07 DIAGNOSIS — E063 Autoimmune thyroiditis: Secondary | ICD-10-CM | POA: Diagnosis not present

## 2017-10-07 DIAGNOSIS — F03918 Unspecified dementia, unspecified severity, with other behavioral disturbance: Secondary | ICD-10-CM

## 2017-10-07 DIAGNOSIS — E038 Other specified hypothyroidism: Secondary | ICD-10-CM

## 2017-10-07 DIAGNOSIS — R791 Abnormal coagulation profile: Secondary | ICD-10-CM

## 2017-10-07 DIAGNOSIS — K5900 Constipation, unspecified: Secondary | ICD-10-CM

## 2017-10-07 DIAGNOSIS — F418 Other specified anxiety disorders: Secondary | ICD-10-CM

## 2017-10-07 NOTE — Progress Notes (Addendum)
Location:  [facility] Room Number: 69 Place of Service:  SNF (31) Provider:  Steffan Caniglia, ManXie  NP  Blanchie Serve, MD  Patient Care Team: Blanchie Serve, MD as PCP - General (Internal Medicine) Moesha Sarchet X, NP as Nurse Practitioner (Nurse Practitioner) Wilford Corner, MD as Consulting Physician (Gastroenterology) Sanda Klein, MD as Consulting Physician (Cardiology)  Extended Emergency Contact Information Primary Emergency Contact: [name],Pat Address: [place] [address] [name] of [place] Phone: [phone] Mobile Phone: [phone] Relation: Daughter  Code Status:  DNR Goals of care: Advanced Directive information Advanced Directives 10/07/2017  Does Patient Have a Medical Advance Directive? Yes  Type of Paramedic of [place];Out of facility DNR (pink MOST or yellow form)  Does patient want to make changes to medical advance directive? No - Patient declined  Copy of [place] in Chart? Yes  Pre-existing out of facility DNR order (yellow form or pink MOST form) Yellow form placed in chart (order not valid for inpatient use);Pink MOST form placed in chart (order not valid for inpatient use)     Chief Complaint  Patient presents with  . Medical Management of Chronic Issues    F/U-HTN, CHF, hypothyroidism, constipation    HPI:  Pt is a 84 y.o. female seen today for medical management of chronic diseases.     The patient hs history of Afib, heart rate is in control, on Nebivolol 2.5mg  qd, Diltiazem 120mg  qd,  Coumadin is on hold due to supratherapeutic SWH6.9. No s/s of bleeding. She resides in [facility], in w/c for mobility, she takes Memantine 28mg  qd for memory, her mood is stabilized on Escitalopram 20mg  qd, Seroquel 12.5mg  qd, prn Lorazepam 0.5mg  q8h, used almost daily.  Hypothyroidism, on Levothyroxine 56mcg, last TSH 0.90 08/23/17. No constipation, taking Senokot S I qd,  MiraLax qod.  Past Medical History:  Diagnosis Date  . Anemia, unspecified   . Anorexia   . Anxiety   . Arthritis   . Atherosclerotic cerebrovascular disease   . Backache, unspecified   . Cataract   . Cholelithiasis   . Chronic kidney disease, unspecified   . Closed fracture of right inferior pubic ramus ([place]) 11/12/2013   11/10/13 s/p fall, ED eval  X-ray R hip  1. Possible nondisplaced right inferior pubic ramus fracture.  2. No femur fracture or dislocation   02/06/14 dc prn Motrin and Norco-not used    . Depression   . Diaphragmatic hernia without mention of obstruction or gangrene   . Diseases of lips 03/26/2013  . Diverticulosis of colon (without mention of hemorrhage)   . Hemorrhage of rectum and anus 03/26/2013  . Hyperlipidemia   . Hypertension   . Insomnia, unspecified   . Laceration of occipital scalp 11/12/2013  . Macular degeneration 06/28/2013  . Macular degeneration (senile) of retina, unspecified   . Osteoporosis   . Other malaise and fatigue   . Other sleep disturbances   . Other specified cardiac dysrhythmias(427.89)   . Other specified personal history presenting hazards to health(V15.89)   . Other symptoms involving cardiovascular system   . Pancreatitis   . Personal history of other diseases of circulatory system   . Personal history of other diseases of digestive system    pancreatitis from gallstones  . Personal history of other diseases of respiratory system   . Personal history of traumatic fracture   . Rosacea   .  Senile osteoporosis    with old T11 fracture  . Thyroid disease   . Unspecified disorders of arteries and arterioles   . Unspecified hearing loss   . Unspecified hemorrhoids without mention of complication    Past Surgical History:  Procedure Laterality Date  . ABDOMINAL HYSTERECTOMY     and BSO fibroids  . COLONOSCOPY  05/23/2007  . EYE LID LIFT Right 09/2016   OD  . HIP ARTHROPLASTY Left 10/31/2016   Procedure: ARTHROPLASTY  HIP  (HEMIARTHROPLASTY);  Surgeon: Paralee Cancel, MD;  Location: [facility];  Service: Orthopedics;  Laterality: Left;  . JOINT REPLACEMENT  1996  . TOTAL HIP ARTHROPLASTY Right     Allergies  Allergen Reactions  . Ace Inhibitors Cough  . Mirtazapine Other (See Comments)    Nightmares   . Penicillins Rash    Has patient had a PCN reaction causing immediate rash, facial/tongue/throat swelling, SOB or lightheadedness with hypotension: Yes Has patient had a PCN reaction causing severe rash involving mucus membranes or skin necrosis: Unknown Has patient had a PCN reaction that required hospitalization: Unknown Has patient had a PCN reaction occurring within the last 10 years: Unknown If all of the above answers are "NO", then may proceed with Cephalosporin use.     Outpatient Encounter Medications as of 10/07/2017  Medication Sig  . acetaminophen (TYLENOL) 500 MG tablet Take 1,000 mg by mouth every 8 (eight) hours as needed for mild pain.   Marland Kitchen acetaminophen (TYLENOL) 500 MG tablet Take 500 mg by mouth every 8 (eight) hours as needed.  Marland Kitchen alum & mag hydroxide-simeth (MAALOX/MYLANTA) 200-200-20 MG/5ML suspension Take 30 mLs by mouth every 4 (four) hours as needed for indigestion.  . cholecalciferol 2000 units TABS Take 1 tablet (2,000 Units total) by mouth daily.  . clindamycin (CLEOCIN) 150 MG capsule Take 600 mg by mouth See admin instructions. ONE HOUR PRIOR TO DENTAL APPOINTMENTS  . diltiazem (CARDIZEM CD) 120 MG 24 hr capsule Take 120 mg by mouth daily.  Marland Kitchen escitalopram (LEXAPRO) 10 MG tablet Take 20 mg by mouth daily.   . hydrocortisone cream 1 % Apply 1 application topically 2 (two) times daily. TO HEMORRHOIDS  . hydroxypropyl methylcellulose / hypromellose (ISOPTO TEARS / GONIOVISC) 2.5 % ophthalmic solution Place 1 drop into both eyes 4 (four) times daily.  Marland Kitchen levothyroxine (SYNTHROID, LEVOTHROID) 25 MCG tablet Take 25 mcg by mouth daily before breakfast.   . Lidocaine (ASPERCREME LIDOCAINE) 4 %  PTCH Apply 1 patch topically at bedtime. APPLY TO LOWER BACK AND REMOVE IN THE MORNING  . loratadine (CLARITIN) 10 MG tablet Take 10 mg by mouth daily.   Marland Kitchen LORazepam (ATIVAN) 0.5 MG tablet Take 0.5 mg by mouth every 8 (eight) hours as needed for anxiety.   . memantine (NAMENDA XR) 28 MG CP24 24 hr capsule Take 28 mg by mouth.   . nebivolol (BYSTOLIC) 2.5 MG tablet Take 2.5 mg by mouth daily.  . Nutritional Supplements (BENECALORIE PO) Take 1.5 oz by mouth 2 (two) times daily.   . ondansetron (ZOFRAN) 4 MG tablet Take 1 tablet (4 mg total) by mouth every 6 (six) hours as needed for nausea.  . polyethylene glycol (MIRALAX / GLYCOLAX) packet Take 17 g by mouth every other day. For constipation and hold for loose stools  . QUEtiapine (SEROQUEL) 25 MG tablet Take 12.5 mg by mouth daily at 2 PM.  . sennosides-docusate sodium (SENOKOT-S) 8.6-50 MG tablet Take 1 tablet by mouth daily.  . [DISCONTINUED] warfarin (COUMADIN)  4 MG tablet Take 4 mg by mouth daily.   No facility-administered encounter medications on file as of 10/07/2017.    ROS was provided with assistance of staff Review of Systems  Constitutional: Negative for activity change, appetite change, chills, diaphoresis, fatigue and fever.  HENT: Positive for hearing loss. Negative for congestion and voice change.   Respiratory: Negative for cough, shortness of breath and wheezing.   Cardiovascular: Negative for chest pain, palpitations and leg swelling.  Gastrointestinal: Negative for abdominal distention, abdominal pain, constipation, diarrhea, nausea and vomiting.  Genitourinary: Negative for difficulty urinating, dysuria and urgency.       Incontinent of bladder.   Musculoskeletal: Positive for arthralgias and gait problem.  Skin: Negative for color change and pallor.  Neurological: Negative for dizziness, speech difficulty, weakness and headaches.       Dementia  Psychiatric/Behavioral: Positive for agitation, behavioral problems and  confusion. Negative for sleep disturbance. The patient is nervous/anxious.     Immunization History  Administered Date(s) Administered  . DTaP 10/12/2012  . Influenza-Unspecified 11/22/2012, 12/26/2013, 11/12/2014, 12/09/2015, 12/14/2016  . PPD Test 12/06/2013  . Pneumococcal Conjugate-13 11/05/2016  . Pneumococcal Polysaccharide-23 09/22/2000  . Td 07/08/2005  . Tdap 11/10/2013   Pertinent  Health Maintenance Due  Topic Date Due  . INFLUENZA VACCINE  09/22/2017  . DEXA SCAN  Completed  . PNA vac Low Risk Adult  Completed   Fall Risk  10/22/2016 01/13/2015 06/28/2013  Falls in the past year? No No No  Risk for fall due to : - Impaired balance/gait -   Functional Status Survey:    Vitals:   10/07/17 1026  BP: 130/70  Pulse: 64  Resp: 20  Temp: 98.8 F (37.1 C)  SpO2: 95%  Weight: 116 lb 8 oz (52.8 kg)  Height: 5' (1.524 m)   Body mass index is 22.75 kg/m. Physical Exam  Constitutional: She appears well-developed and well-nourished.  HENT:  Head: Normocephalic and atraumatic.  Eyes: Pupils are equal, round, and reactive to light. EOM are normal.  Neck: Normal range of motion. Neck supple. No JVD present. No thyromegaly present.  Cardiovascular: Normal rate.  Murmur heard. Irregular heart beats.   Pulmonary/Chest: Effort normal. She has no wheezes. She has no rales.  Abdominal: Soft. She exhibits no distension. There is no tenderness.  Musculoskeletal: She exhibits no edema or tenderness.  Needs assistance for transfer, w/c to get around.   Neurological: She is alert. No cranial nerve deficit. She exhibits normal muscle tone. Coordination normal.  Oriented to self.   Skin: Skin is warm and dry. No pallor.  Psychiatric: She has a normal mood and affect. Her behavior is normal.  Upon my visit today.     Labs reviewed: Recent Labs    10/31/16 0236 11/01/16 0353 11/02/16 0445  05/19/17 06/15/17 06/16/17 0819 08/23/17  NA 140 140 139   < > 142 142  --  144  K  3.7 3.8 3.9   < > 4.1 4.7  --  4.1  CL 112* 113* 112*  --   --   --   --  11  CO2 21* 21* 20*  --   --   --  29 28  GLUCOSE 108* 149* 110*  --   --   --   --   --   BUN 27* 32* 24*   < > 39* 32*  --  37*  CREATININE 0.90 0.90 0.80   < > 1.2* 1.1  --  1.0  CALCIUM 8.2* 8.3* 8.3*  --   --   --   --  9.0   < > = values in this interval not displayed.   Recent Labs    10/28/16 0329  05/19/17 06/15/17 08/23/17  AST  --    < > 16 16 14   ALT  --    < > 8 8 6*  ALKPHOS  --    < > 70 76 68  ALBUMIN 3.0*  --   --   --  3.0   < > = values in this interval not displayed.   Recent Labs    10/27/16 1715  11/01/16 0353 11/02/16 0445 11/03/16 0318  06/15/17 08/23/17 08/30/17  WBC 9.6   < > 6.0 7.4 8.4   < > 3.7 4.5 5.5  NEUTROABS 8.4*  --   --   --   --   --   --   --   --   HGB 12.4   < > 10.0* 9.5* 10.3*   < > 10.7* 9.2* 10.3*  HCT 39.1   < > 31.2* 29.8* 32.6*   < > 32* 38 32*  MCV 94.7   < > 94.8 96.1 95.0  --   --   --   --   PLT 174   < > 179 163 197   < > 229 198 210   < > = values in this interval not displayed.   Lab Results  Component Value Date   TSH 0.90 08/23/2017   No results found for: HGBA1C No results found for: CHOL, HDL, LDLCALC, LDLDIRECT, TRIG, CHOLHDL  Significant Diagnostic Results in last 30 days:  No results found.  Assessment/Plan Supratherapeutic INR Hold Coumadin, repeat PT/INR 10/08/17, monitor for s/s of bleeding.   A-fib (HCC) Heart rate is in control, continue Nebivolol 2.5mg  qd, Diltiazem 120mg  qd.   Hypothyroidism Stable, continue Levothyroxine 39mcg qd, last TSH 0.90 08/23/17  Senile dementia Continue SN[facility] for care needs and safety. Continue Memantine 28mg  qd.   Anxiety with depression Her mood is stabilized, continue Escitalopram 20mg  qd, prn Lorazepam 0.64mb q12 x 30 days, Seroquel 12.5mg  qd. Observe.   Constipation Stable, continue MiraLax qod, Senokot S I qhs.      Family/ staff Communication: plan of care reviewed with the patient  and charge nurse.   Labs/tests ordered:  PT/INR 10/08/17  Time spend 25 minutes.

## 2017-10-07 NOTE — Assessment & Plan Note (Signed)
Stable, continue MiraLax qod, Senokot S I qhs.

## 2017-10-07 NOTE — Assessment & Plan Note (Signed)
Heart rate is in control, continue Nebivolol 2.5mg  qd, Diltiazem 120mg  qd.

## 2017-10-07 NOTE — Assessment & Plan Note (Signed)
Stable, continue Levothyroxine 39mcg qd, last TSH 0.90 08/23/17

## 2017-10-07 NOTE — Assessment & Plan Note (Signed)
Hold Coumadin, repeat PT/INR 10/08/17, monitor for s/s of bleeding.

## 2017-10-07 NOTE — Assessment & Plan Note (Addendum)
Her mood is stabilized, continue Escitalopram 20mg  qd, prn Lorazepam 0.49mb q12 x 30 days, Seroquel 12.5mg  qd. Observe.

## 2017-10-07 NOTE — Assessment & Plan Note (Signed)
Continue SNF [facility] for care needs and safety. Continue Memantine 28mg  qd.

## 2017-10-08 DIAGNOSIS — I482 Chronic atrial fibrillation: Secondary | ICD-10-CM | POA: Diagnosis not present

## 2017-10-08 LAB — PROTIME-INR: PROTIME: 14.8 — AB (ref 10.0–13.8)

## 2017-10-08 LAB — POCT INR: INR: 1.4 — AB (ref <=1.1)

## 2017-10-10 ENCOUNTER — Other Ambulatory Visit: Payer: Self-pay | Admitting: *Deleted

## 2017-10-13 DIAGNOSIS — I482 Chronic atrial fibrillation: Secondary | ICD-10-CM | POA: Diagnosis not present

## 2017-10-18 DIAGNOSIS — I481 Persistent atrial fibrillation: Secondary | ICD-10-CM | POA: Diagnosis not present

## 2017-10-24 DIAGNOSIS — I482 Chronic atrial fibrillation: Secondary | ICD-10-CM | POA: Diagnosis not present

## 2017-10-25 ENCOUNTER — Non-Acute Institutional Stay (SKILLED_NURSING_FACILITY): Payer: PPO

## 2017-10-25 DIAGNOSIS — Z0000 Encounter for general adult medical examination without abnormal findings: Secondary | ICD-10-CM

## 2017-10-25 NOTE — Patient Instructions (Addendum)
Patricia Nelson , Thank you for taking time to come for your Medicare Wellness Visit. I appreciate your ongoing commitment to your health goals. Please review the following plan we discussed and let me know if I can assist you in the future.   Screening recommendations/referrals: Colonoscopy excluded, over age 82 Mammogram excluded, over age 82 Bone Density up to date Recommended yearly ophthalmology/optometry visit for glaucoma screening and checkup Recommended yearly dental visit for hygiene and checkup  Vaccinations: Influenza vaccine due, will receive at [facility] Pneumococcal vaccine up to date, completed Tdap vaccine up to date, due 11/11/2023 Shingles vaccine not in past records    Advanced directives: in chart  Conditions/risks identified: none  Next appointment: Dr. Bubba Camp makes rounds   Preventive Care 82 Years and Older, Female Preventive care refers to lifestyle choices and visits with your health care provider that can promote health and wellness.errals: Colonoscopy excluded, over age 82 Mammogram excluded, over age 82 Bone Density up to date Recommended yearly ophthalmology/optometry visit for glaucoma screening and checkup Recommended yearly dental visit for hygiene and checkup  Vaccinations: Influenza vaccine due, will receive at [facility] Pneumococcal vaccine up to date, completed Tdap vaccine up to date, due 11/11/2023 Shingles vaccine not in past records    Advanced directives: in chart  Conditions/risks identified: none  Next appointment: Dr. Bubba Camp makes rounds   Preventive Care 82 Years and Older, Female Preventive care refers to lifestyle choices and visits with your health care provider that can promote health and wellness. What does preventive care include?  A yearly physical exam. This is also called an annual well check.  Dental exams once or twice a year.  Routine eye exams. Ask your health care provider how often you should have your eyes checked.  Personal lifestyle choices, including:  Daily care of your teeth and gums.  Regular physical activity.  Eating a healthy diet.  Avoiding tobacco and drug use.  Limiting alcohol use.  Practicing safe sex.  Taking low-dose aspirin every day.  Taking vitamin and mineral supplements as recommended by your health care provider. What happens during an annual well check? The services and screenings done by your health care provider during your annual well check will depend on your age, overall health, lifestyle risk factors, and family history of disease. Counseling  Your health care provider may ask you questions about your:  Alcohol use.  Tobacco use.  Drug use.  Emotional  well-being.  Home and relationship well-being.  Sexual activity.  Eating habits.  History of falls.  Memory and ability to understand (cognition).  Work and work Statistician.  Reproductive health. Screening  You may have the following tests or measurements:  Height, weight, and BMI.  Blood pressure.  Lipid and cholesterol levels. These may be checked every 5 years, or more frequently if you are over 44 years old.  Skin check.  Lung cancer screening. You may have this screening every year starting at age 25 if you have a 30-pack-year history of smoking and currently smoke or have quit within the past 15 years.  Fecal occult blood test (FOBT) of the stool. You may have this test every year starting at age 45.  Flexible sigmoidoscopy or colonoscopy. You may have a sigmoidoscopy every 5 years or a colonoscopy every 10 years starting at age 56.  Hepatitis C blood test.  Hepatitis B blood test.  Sexually transmitted disease (STD) testing.  Diabetes screening. This is done by checking your blood sugar (glucose) after you have not eaten for a while (fasting). You may have this done every 1-3 years.  Bone density scan. This is done to screen for osteoporosis. You may have this done starting at age 1.  Mammogram. This may be done every 1-2 years. Talk to your health care provider about how often you should have regular mammograms. Talk with your health care provider about your test results, treatment options, and if necessary, the need for more tests. Vaccines  Your health care provider may recommend certain vaccines, such as:  Influenza vaccine. This is recommended every year.  Tetanus, diphtheria, and acellular pertussis (Tdap, Td) vaccine.  You may need a Td booster every 10 years.  Zoster vaccine. You may need this after age 22.  Pneumococcal 13-valent conjugate (PCV13) vaccine. One dose is recommended after age 76.  Pneumococcal polysaccharide (PPSV23) vaccine. One  dose is recommended after age 69. Talk to your health care provider about which screenings and vaccines you need and how often you need them. This information is not intended to replace advice given to you by your health care provider. Make sure you discuss any questions you have with your health care provider. Document Released: 03/07/2015 Document Revised: 10/29/2015 Document Reviewed: 12/10/2014 Elsevier Interactive Patient Education  2017 [place] Prevention in the Home Falls can cause injuries. They can happen to people of all ages. There are many things you can do to make your home safe and to help prevent falls. What can I do on the outside of my home?  Regularly fix the edges of walkways and driveways and fix any cracks.  Remove anything that might make you trip as you walk through a door, such as a raised step or threshold.  Trim any bushes or trees on the path to your home.  Use bright outdoor lighting.  Clear any walking paths of anything that might make someone trip, such as rocks or tools.  Regularly check to see if handrails are loose or broken. Make sure that both sides of any steps have handrails.  Any raised decks and porches should have guardrails on the edges.  Have any leaves, snow, or ice cleared regularly.  Use sand or salt on walking paths during winter.  Clean up any spills in your garage right away. This includes oil or grease spills. What can I do in the bathroom?  Use night lights.  Install grab bars by the toilet and in the tub and shower. Do not use towel bars as grab bars.  Use non-skid mats or decals in the tub or shower.  If you need to sit down in the shower, use a plastic, non-slip stool.  Keep the floor dry. Clean up any water that spills on the floor as soon as it happens.  Remove soap buildup in the tub or shower regularly.  Attach bath mats securely with double-sided non-slip rug tape.  Do not have throw rugs and other  things on the floor that can make you trip. What can I do in the bedroom?  Use night lights.  Make sure that you have a light by your bed that is easy to reach.  Do not use any sheets or blankets that are too big for your bed. They should not hang down onto the floor.  Have a firm chair that has side arms. You can use this for support while you get dressed.  Do not have throw rugs and other things on the floor that can make you trip. What can I do in the kitchen?  Clean up any spills right away.  Avoid walking on wet floors.  Keep items that you use a lot in easy-to-reach places.  If you need to reach something above you, use a strong step stool that has a grab bar.  Keep electrical cords out of the way.  Do not use floor polish or wax that makes floors slippery. If you must use wax, use non-skid floor wax.  Do not have throw rugs and other things on the floor that can make you trip. What can I do with my stairs?  Do not leave any  items on the stairs.  Make sure that there are handrails on both sides of the stairs and use them. Fix handrails that are broken or loose. Make sure that handrails are as long as the stairways.  Check any carpeting to make sure that it is firmly attached to the stairs. Fix any carpet that is loose or worn.  Avoid having throw rugs at the top or bottom of the stairs. If you do have throw rugs, attach them to the floor with carpet tape.  Make sure that you have a light switch at the top of the stairs and the bottom of the stairs. If you do not have them, ask someone to add them for you. What else can I do to help prevent falls?  Wear shoes that:  Do not have high heels.  Have rubber bottoms.  Are comfortable and fit you well.  Are closed at the toe. Do not wear sandals.  If you use a stepladder:  Make sure that it is fully opened. Do not climb a closed stepladder.  Make sure that both sides of the stepladder are locked into place.  Ask  someone to hold it for you, if possible.  Clearly mark and make sure that you can see:  Any grab bars or handrails.  First and last steps.  Where the edge of each step is.  Use tools that help you move around (mobility aids) if they are needed. These include:  Canes.  Walkers.  Scooters.  Crutches.  Turn on the lights when you go into a dark area. Replace any light bulbs as soon as they burn out.  Set up your furniture so you have a clear path. Avoid moving your furniture around.  If any of your floors are uneven, fix them.  If there are any pets around you, be aware of where they are.  Review your medicines with your doctor. Some medicines can make you feel dizzy. This can increase your chance of falling. Ask your doctor what other things that you can do to help prevent falls. This information is not intended to replace advice given to you by your health care provider. Make sure you discuss any questions you have with your health care provider. Document Released: 12/05/2008 Document Revised: 07/17/2015 Document Reviewed: 03/15/2014 Elsevier Interactive Patient Education  2017 [employer].

## 2017-10-25 NOTE — Progress Notes (Signed)
Subjective:   Patricia Nelson is a 84 y.o. female who presents for Medicare Annual (Subsequent) preventive examination at [facility]; incapacitated patient unable to answer questions appropriately   Last AWV-10/22/2016    Objective:     Vitals: BP 132/64 (BP Location: Left Arm, Patient Position: Sitting)   Pulse 67   Temp 98.2 F (36.8 C) (Oral)   Ht 5' (1.524 m)   Wt 116 lb (52.6 kg)   BMI 22.65 kg/m   Body mass index is 22.65 kg/m.  Advanced Directives 10/25/2017 10/07/2017 09/09/2017 09/01/2017 07/25/2017 06/27/2017 06/09/2017  Does Patient Have a Medical Advance Directive? Yes Yes Yes Yes Yes Yes Yes  Type of Paramedic of [place];Out of facility DNR (pink MOST or yellow form) [place];Out of facility DNR (pink MOST or yellow form) [place];Out of facility DNR (pink MOST or yellow form) [place];Out of facility DNR (pink MOST or yellow form) [place];Out of facility DNR (pink MOST or yellow form) [place];Out of facility DNR (pink MOST or yellow form) [place];Out of facility DNR (pink MOST or yellow form)  Does patient want to make changes to medical advance directive? No - Patient declined No - Patient declined No - Patient declined No - Patient declined No - Patient declined No - Patient declined No - Patient declined  Copy of [place] in Chart? Yes Yes Yes Yes Yes Yes Yes  Pre-existing out of facility DNR order (yellow form or pink MOST form) Yellow form placed in chart (order not valid for inpatient use);Pink MOST form placed in chart (order not valid for inpatient use) Yellow form placed in chart (order not valid for inpatient use);Pink MOST form placed in chart (order not valid for inpatient use) Yellow form placed in chart (order not valid for inpatient use);Pink MOST form placed in chart (order  not valid for inpatient use) Yellow form placed in chart (order not valid for inpatient use);Pink MOST form placed in chart (order not valid for inpatient use) Yellow form placed in chart (order not valid for inpatient use) Yellow form placed in chart (order not valid for inpatient use) Yellow form placed in chart (order not valid for inpatient use)    Tobacco Social History   Tobacco Use  Smoking Status Never Smoker  Smokeless Tobacco Never Used     Counseling given: Not Answered   Clinical Intake:  Pre-visit preparation completed: No  Pain : Faces Faces Pain Scale: No hurt  Faces Pain Scale: No hurt  Nutritional Risks: None Diabetes: No  How often do you need to have someone help you when you read instructions, pamphlets, or other written materials from your doctor or pharmacy?: 5 - Always  Interpreter Needed?: No  Information entered by :: Tyson Dense, RN  Past Medical History:  Diagnosis Date  . Anemia, unspecified   . Anorexia   . Anxiety   . Arthritis   . Atherosclerotic cerebrovascular disease   . Backache, unspecified   . Cataract   . Cholelithiasis   . Chronic kidney disease, unspecified   . Closed fracture of right inferior pubic ramus ([place]) 11/12/2013   11/10/13 s/p fall, ED eval  X-ray R hip  1. Possible nondisplaced right inferior pubic ramus fracture.  2. No femur fracture or dislocation   02/06/14 dc prn Motrin and Norco-not used    . Depression   .  Diaphragmatic hernia without mention of obstruction or gangrene   . Diseases of lips 03/26/2013  . Diverticulosis of colon (without mention of hemorrhage)   . Hemorrhage of rectum and anus 03/26/2013  . Hyperlipidemia   . Hypertension   . Insomnia, unspecified   . Laceration of occipital scalp 11/12/2013  . Macular degeneration 06/28/2013  . Macular degeneration (senile) of retina, unspecified   . Osteoporosis   . Other malaise and fatigue   . Other sleep disturbances   . Other specified cardiac  dysrhythmias(427.89)   . Other specified personal history presenting hazards to health(V15.89)   . Other symptoms involving cardiovascular system   . Pancreatitis   . Personal history of other diseases of circulatory system   . Personal history of other diseases of digestive system    pancreatitis from gallstones  . Personal history of other diseases of respiratory system   . Personal history of traumatic fracture   . Rosacea   . Senile osteoporosis    with old T11 fracture  . Thyroid disease   . Unspecified disorders of arteries and arterioles   . Unspecified hearing loss   . Unspecified hemorrhoids without mention of complication    Past Surgical History:  Procedure Laterality Date  . ABDOMINAL HYSTERECTOMY     and BSO fibroids  . COLONOSCOPY  05/23/2007  . EYE LID LIFT Right 09/2016   OD  . HIP ARTHROPLASTY Left 10/31/2016   Procedure: ARTHROPLASTY  HIP (HEMIARTHROPLASTY);  Surgeon: Paralee Cancel, MD;  Location: [facility];  Service: Orthopedics;  Laterality: Left;  . JOINT REPLACEMENT  1996  . TOTAL HIP ARTHROPLASTY Right    Family History  Problem Relation Age of Onset  . Heart disease Mother   . Diabetes Mother   . Heart disease Father   . Diabetes Sister   . Diabetes Brother   . Diabetes Daughter    Social History   Socioeconomic History  . Marital status: Widowed    Spouse name: Not on file  . Number of children: Not on file  . Years of education: Not on file  . Highest education level: Not on file  Occupational History  . Occupation: retired Warden/ranger  . Financial resource strain: Not on file  . Food insecurity:    Worry: Not on file    Inability: Not on file  . Transportation needs:    Medical: Not on file    Non-medical: Not on file  Tobacco Use  . Smoking status: Never Smoker  . Smokeless tobacco: Never Used  Substance and Sexual Activity  . Alcohol use: No  . Drug use: No  . Sexual activity: Never  Lifestyle  . Physical  activity:    Days per week: Not on file    Minutes per session: Not on file  . Stress: Not on file  Relationships  . Social connections:    Talks on phone: Not on file    Gets together: Not on file    Attends religious service: Not on file    Active member of club or organization: Not on file    Attends meetings of clubs or organizations: Not on file    Relationship status: Not on file  Other Topics Concern  . Not on file  Social History Narrative   Lives at [facility] 2011, transferred to memory care 11/12/2013   Widowed   Caffeine   Exercise: none   Never smoked   Alcohol none  DNR    Outpatient Encounter Medications as of 10/25/2017  Medication Sig  . acetaminophen (TYLENOL) 500 MG tablet Take 1,000 mg by mouth every 8 (eight) hours as needed for mild pain.   Marland Kitchen acetaminophen (TYLENOL) 500 MG tablet Take 500 mg by mouth every 8 (eight) hours as needed.  Marland Kitchen alum & mag hydroxide-simeth (MAALOX/MYLANTA) 200-200-20 MG/5ML suspension Take 30 mLs by mouth every 4 (four) hours as needed for indigestion.  . cholecalciferol 2000 units TABS Take 1 tablet (2,000 Units total) by mouth daily.  . clindamycin (CLEOCIN) 150 MG capsule Take 600 mg by mouth See admin instructions. ONE HOUR PRIOR TO DENTAL APPOINTMENTS  . diltiazem (CARDIZEM CD) 120 MG 24 hr capsule Take 120 mg by mouth daily.  Marland Kitchen escitalopram (LEXAPRO) 10 MG tablet Take 20 mg by mouth daily.   . hydrocortisone cream 1 % Apply 1 application topically 2 (two) times daily. TO HEMORRHOIDS  . hydroxypropyl methylcellulose / hypromellose (ISOPTO TEARS / GONIOVISC) 2.5 % ophthalmic solution Place 1 drop into both eyes 4 (four) times daily.  Marland Kitchen levothyroxine (SYNTHROID, LEVOTHROID) 25 MCG tablet Take 25 mcg by mouth daily before breakfast.   . Lidocaine (ASPERCREME LIDOCAINE) 4 % PTCH Apply 1 patch topically at bedtime. APPLY TO LOWER BACK AND REMOVE IN THE MORNING  . loratadine (CLARITIN) 10 MG tablet Take 10 mg by mouth  daily.   Marland Kitchen LORazepam (ATIVAN) 0.5 MG tablet Take 0.5 mg by mouth every 8 (eight) hours as needed for anxiety.   . memantine (NAMENDA XR) 28 MG CP24 24 hr capsule Take 28 mg by mouth.   . nebivolol (BYSTOLIC) 2.5 MG tablet Take 2.5 mg by mouth daily.  . Nutritional Supplements (BENECALORIE PO) Take 1.5 oz by mouth 2 (two) times daily.   . ondansetron (ZOFRAN) 4 MG tablet Take 1 tablet (4 mg total) by mouth every 6 (six) hours as needed for nausea.  . polyethylene glycol (MIRALAX / GLYCOLAX) packet Take 17 g by mouth every other day. For constipation and hold for loose stools  . QUEtiapine (SEROQUEL) 25 MG tablet Take 12.5 mg by mouth daily at 2 PM.  . sennosides-docusate sodium (SENOKOT-S) 8.6-50 MG tablet Take 1 tablet by mouth daily.   No facility-administered encounter medications on file as of 10/25/2017.     Activities of Daily Living In your present state of health, do you have any difficulty performing the following activities: 10/25/2017 10/28/2016  Hearing? [place]? Y -  Difficulty concentrating or making decisions? Y -  Walking or climbing stairs? Y -  Dressing or bathing? Y -  Doing errands, shopping? Tempie Donning  Preparing Food and eating ? Y -  Using the Toilet? Y -  In the past six months, have you accidently leaked urine? Y -  Do you have problems with loss of bowel control? Y -  Managing your Medications? Y -  Managing your Finances? Y -  Housekeeping or managing your Housekeeping? Y -  Some recent data might be hidden    Patient Care Team: Blanchie Serve, MD as PCP - General (Internal Medicine) Mast, Man X, NP as Nurse Practitioner (Nurse Practitioner) Wilford Corner, MD as Consulting Physician (Gastroenterology) Sanda Klein, MD as Consulting Physician (Cardiology)    Assessment:   This is a routine wellness examination for Maynard.  Exercise Activities and Dietary recommendations Current Exercise Habits: The patient does not participate in regular exercise at  present, Exercise limited by: orthopedic condition(s);neurologic condition(s)  Goals   None  Fall Risk Fall Risk  10/25/2017 10/22/2016 01/13/2015 06/28/2013  Falls in the past year? No No No No  Risk for fall due to : - - Impaired balance/gait -   Is the patient's home free of loose throw rugs in walkways, pet beds, electrical cords, etc?   yes      Grab bars in the bathroom? yes      Handrails on the stairs?   yes      Adequate lighting?   yes  Timed Get Up and Go performed: patient is unambulatory  Depression Screen PHQ 2/9 Scores 10/25/2017 10/22/2016 01/13/2015 06/28/2013  PHQ - 2 Score - 0 0 3  Exception Documentation Medical reason - - -     Cognitive Function MMSE - Mini Mental State Exam 10/25/2017 10/22/2016  Not completed: Unable to complete Unable to complete        Immunization History  Administered Date(s) Administered  . DTaP 10/12/2012  . Influenza-Unspecified 11/22/2012, 12/26/2013, 11/12/2014, 12/09/2015, 12/14/2016  . PPD Test 12/06/2013  . Pneumococcal Conjugate-13 11/05/2016  . Pneumococcal Polysaccharide-23 09/22/2000  . Td 07/08/2005  . Tdap 11/10/2013    Qualifies for Shingles Vaccine? Not in past records  Screening Tests Health Maintenance  Topic Date Due  . INFLUENZA VACCINE  09/22/2017  . TETANUS/TDAP  11/11/2023  . DEXA SCAN  Completed  . PNA vac Low Risk Adult  Completed    Cancer Screenings: Lung: Low Dose CT Chest recommended if Age 66-80 years, 30 pack-year currently smoking OR have quit w/in 15years. Patient does not qualify. Breast:  Up to date on Mammogram? Yes   Up to date of Bone Density/Dexa? Yes Colorectal: up to date  Additional Screenings:  Hepatitis C Screening: declined Flu vaccine due: will receive at [facility]:    I have personally reviewed and addressed the Medicare Annual Wellness questionnaire and have noted the following in the patient's chart:  A. Medical and social history B. Use of alcohol, tobacco or  illicit drugs  C. Current medications and supplements D. Functional ability and status E.  Nutritional status F.  Physical activity G. Advance directives H. List of other physicians I.  Hospitalizations, surgeries, and ER visits in previous 12 months J.  [place] to include hearing, vision, cognitive, depression L. Referrals and appointments - none  In addition, I am unable to review and discuss with incapacitated patient certain preventive protocols, quality metrics, and best practice recommendations. A written personalized care plan for preventive services as well as general preventive health recommendations were provided to patient.   See attached scanned questionnaire for additional information.   Signed,   Tyson Dense, RN Nurse Health Advisor

## 2017-10-27 DIAGNOSIS — I481 Persistent atrial fibrillation: Secondary | ICD-10-CM | POA: Diagnosis not present

## 2017-11-02 ENCOUNTER — Encounter: Payer: Self-pay | Admitting: Nurse Practitioner

## 2017-11-02 ENCOUNTER — Non-Acute Institutional Stay (SKILLED_NURSING_FACILITY): Payer: PPO | Admitting: Nurse Practitioner

## 2017-11-02 DIAGNOSIS — I10 Essential (primary) hypertension: Secondary | ICD-10-CM

## 2017-11-02 DIAGNOSIS — N3942 Incontinence without sensory awareness: Secondary | ICD-10-CM | POA: Diagnosis not present

## 2017-11-02 DIAGNOSIS — Z7901 Long term (current) use of anticoagulants: Secondary | ICD-10-CM

## 2017-11-02 DIAGNOSIS — F0151 Vascular dementia with behavioral disturbance: Secondary | ICD-10-CM

## 2017-11-02 DIAGNOSIS — F01518 Vascular dementia, unspecified severity, with other behavioral disturbance: Secondary | ICD-10-CM

## 2017-11-02 DIAGNOSIS — I480 Paroxysmal atrial fibrillation: Secondary | ICD-10-CM

## 2017-11-02 DIAGNOSIS — F0150 Vascular dementia without behavioral disturbance: Secondary | ICD-10-CM | POA: Insufficient documentation

## 2017-11-02 DIAGNOSIS — L89312 Pressure ulcer of right buttock, stage 2: Secondary | ICD-10-CM | POA: Diagnosis not present

## 2017-11-02 DIAGNOSIS — Z5181 Encounter for therapeutic drug level monitoring: Secondary | ICD-10-CM | POA: Diagnosis not present

## 2017-11-02 NOTE — Progress Notes (Signed)
Location:  [place] Room Number: 52 Place of Service:  SNF (31) Provider:  Merl Bommarito, ManXie  NP  Blanchie Serve, MD  Patient Care Team: Blanchie Serve, MD as PCP - General (Internal Medicine) Nikiesha Milford X, NP as Nurse Practitioner (Nurse Practitioner) Wilford Corner, MD as Consulting Physician (Gastroenterology) Sanda Klein, MD as Consulting Physician (Cardiology)  Extended Emergency Contact Information Primary Emergency Contact: [name],Pat Address: [place] [address] [name] [place] Phone: [phone] Mobile Phone: [phone] Relation: Daughter  Code Status:  DNR Goals of care: Advanced Directive information Advanced Directives 10/25/2017  Does Patient Have a Medical Advance Directive? Yes  Type of Paramedic of [place];Out of facility DNR (pink MOST or yellow form)  Does patient want to make changes to medical advance directive? No - Patient declined  Copy of [place] in Chart? Yes  Pre-existing out of facility DNR order (yellow form or pink MOST form) Yellow form placed in chart (order not valid for inpatient use);Pink MOST form placed in chart (order not valid for inpatient use)     Chief Complaint  Patient presents with  . Acute Visit    (R) open wound on buttock    HPI:  Pt is a 84 y.o. female seen today for an acute visit for reported red open area to right buttock cheek, duration is uncertain. The patient is w/c dependent, incontinent of urine. Incidental finding is mildly elevated Sbp 125mmHg today. She denied headache, dizziness, change of vision, chest pain/pressure, palpitation, nausea or vomiting. She is afebrile. HPI was provided with assistance of staff due to the patient's dementia. She resides in [facility] for safety and care assistance. On Memantine 28mg  qd for memory. Hx of HTN, on Diltiazem 120mg  qd, Nebivolol 2.5mg  qd. Hx of Afib, the patient's  daughter told me she desires alternative anticoagulation in stead of Coumadin due to difficulty of blood drawing.       Past Medical History:  Diagnosis Date  . Anemia, unspecified   . Anorexia   . Anxiety   . Arthritis   . Atherosclerotic cerebrovascular disease   . Backache, unspecified   . Cataract   . Cholelithiasis   . Chronic kidney disease, unspecified   . Closed fracture of right inferior pubic ramus ([place]) 11/12/2013   11/10/13 s/p fall, ED eval  X-ray R hip  1. Possible nondisplaced right inferior pubic ramus fracture.  2. No femur fracture or dislocation   02/06/14 dc prn Motrin and Norco-not used    . Depression   . Diaphragmatic hernia without mention of obstruction or gangrene   . Diseases of lips 03/26/2013  . Diverticulosis of colon (without mention of hemorrhage)   . Hemorrhage of rectum and anus 03/26/2013  . Hyperlipidemia   . Hypertension   . Insomnia, unspecified   . Laceration of occipital scalp 11/12/2013  . Macular degeneration 06/28/2013  . Macular degeneration (senile) of retina, unspecified   . Osteoporosis   . Other malaise and fatigue   . Other sleep disturbances   . Other specified cardiac dysrhythmias(427.89)   . Other specified personal history presenting hazards to health(V15.89)   . Other symptoms involving cardiovascular system   . Pancreatitis   . Personal history of other diseases of circulatory system   . Personal history of other diseases of digestive system    pancreatitis from gallstones  . Personal history of other diseases  of respiratory system   . Personal history of traumatic fracture   . Rosacea   . Senile osteoporosis    with old T11 fracture  . Thyroid disease   . Unspecified disorders of arteries and arterioles   . Unspecified hearing loss   . Unspecified hemorrhoids without mention of complication    Past Surgical History:  Procedure Laterality Date  . ABDOMINAL HYSTERECTOMY     and BSO fibroids  . COLONOSCOPY  05/23/2007  .  EYE LID LIFT Right 09/2016   OD  . HIP ARTHROPLASTY Left 10/31/2016   Procedure: ARTHROPLASTY  HIP (HEMIARTHROPLASTY);  Surgeon: Paralee Cancel, MD;  Location: [place] [facility];  Service: Orthopedics;  Laterality: Left;  . JOINT REPLACEMENT  1996  . TOTAL HIP ARTHROPLASTY Right     Allergies  Allergen Reactions  . Ace Inhibitors Cough  . Mirtazapine Other (See Comments)    Nightmares   . Penicillins Rash    Has patient had a PCN reaction causing immediate rash, facial/tongue/throat swelling, SOB or lightheadedness with hypotension: Yes Has patient had a PCN reaction causing severe rash involving mucus membranes or skin necrosis: Unknown Has patient had a PCN reaction that required hospitalization: Unknown Has patient had a PCN reaction occurring within the last 10 years: Unknown If all of the above answers are "NO", then may proceed with Cephalosporin use.     Outpatient Encounter Medications as of 11/02/2017  Medication Sig  . acetaminophen (TYLENOL) 500 MG tablet Take 500 mg by mouth every 8 (eight) hours as needed.  Marland Kitchen alum & mag hydroxide-simeth (MAALOX/MYLANTA) 200-200-20 MG/5ML suspension Take 30 mLs by mouth every 4 (four) hours as needed for indigestion.  . cholecalciferol 2000 units TABS Take 1 tablet (2,000 Units total) by mouth daily.  . clindamycin (CLEOCIN) 150 MG capsule Take 600 mg by mouth See admin instructions. ONE HOUR PRIOR TO DENTAL APPOINTMENTS  . diltiazem (CARDIZEM CD) 120 MG 24 hr capsule Take 120 mg by mouth daily.  Marland Kitchen escitalopram (LEXAPRO) 10 MG tablet Take 20 mg by mouth daily.   . hydrocortisone cream 1 % Apply 1 application topically 2 (two) times daily. TO HEMORRHOIDS  . hydroxypropyl methylcellulose / hypromellose (ISOPTO TEARS / GONIOVISC) 2.5 % ophthalmic solution Place 1 drop into both eyes 4 (four) times daily.  Marland Kitchen levothyroxine (SYNTHROID, LEVOTHROID) 25 MCG tablet Take 25 mcg by mouth daily before breakfast.   . Lidocaine (ASPERCREME LIDOCAINE) 4 % PTCH Apply 1  patch topically at bedtime. APPLY TO LOWER BACK AND REMOVE AT 8PM  . loratadine (CLARITIN) 10 MG tablet Take 10 mg by mouth daily.   . memantine (NAMENDA XR) 28 MG CP24 24 hr capsule Take 28 mg by mouth.   . nebivolol (BYSTOLIC) 2.5 MG tablet Take 2.5 mg by mouth daily.  . Nutritional Supplements (BENECALORIE PO) Take 1.5 oz by mouth 2 (two) times daily.   . ondansetron (ZOFRAN) 4 MG tablet Take 1 tablet (4 mg total) by mouth every 6 (six) hours as needed for nausea.  . polyethylene glycol (MIRALAX / GLYCOLAX) packet Take 17 g by mouth every other day. For constipation and hold for loose stools  . QUEtiapine (SEROQUEL) 25 MG tablet Take 12.5 mg by mouth daily at 2 PM.  . sennosides-docusate sodium (SENOKOT-S) 8.6-50 MG tablet Take 1 tablet by mouth daily.  Marland Kitchen warfarin (COUMADIN) 3 MG tablet Take 3 mg by mouth one time only at 6 PM.  . [DISCONTINUED] acetaminophen (TYLENOL) 500 MG tablet Take 1,000 mg by mouth  every 8 (eight) hours as needed for mild pain.   . [DISCONTINUED] LORazepam (ATIVAN) 0.5 MG tablet Take 0.5 mg by mouth every 12 (twelve) hours as needed for anxiety.    No facility-administered encounter medications on file as of 11/02/2017.   ROS was provided with assistance of staff  Review of Systems  Constitutional: Negative for activity change, appetite change, chills, diaphoresis, fatigue and fever.  HENT: Positive for hearing loss. Negative for congestion and voice change.   Respiratory: Negative for cough, chest tightness and shortness of breath.   Cardiovascular: Negative for chest pain, palpitations and leg swelling.  Gastrointestinal: Negative for abdominal distention, abdominal pain, constipation, diarrhea, nausea and vomiting.  Genitourinary: Negative for dysuria and urgency.       Incontinent of urine.   Musculoskeletal: Positive for arthralgias and gait problem.  Skin: Positive for wound. Negative for color change and pallor.       Right buttock pressure ulcer.     Neurological: Negative for dizziness, speech difficulty, weakness and headaches.       Dementia.   Psychiatric/Behavioral: Positive for agitation, behavioral problems and confusion. Negative for hallucinations and sleep disturbance. The patient is nervous/anxious.     Immunization History  Administered Date(s) Administered  . DTaP 10/12/2012  . Influenza-Unspecified 11/22/2012, 12/26/2013, 11/12/2014, 12/09/2015, 12/14/2016  . PPD Test 12/06/2013  . Pneumococcal Conjugate-13 11/05/2016  . Pneumococcal Polysaccharide-23 09/22/2000  . Td 07/08/2005  . Tdap 11/10/2013   Pertinent  Health Maintenance Due  Topic Date Due  . INFLUENZA VACCINE  09/22/2017  . DEXA SCAN  Completed  . PNA vac Low Risk Adult  Completed   Fall Risk  10/25/2017 10/22/2016 01/13/2015 06/28/2013  Falls in the past year? No No No No  Risk for fall due to : - - Impaired balance/gait -   Functional Status Survey:    Vitals:   11/02/17 1343  BP: (!) 150/80  Pulse: 66  Resp: 20  Temp: 97.6 F (36.4 C)  SpO2: 94%  Weight: 118 lb 8 oz (53.8 kg)  Height: 5' (1.524 m)   Body mass index is 23.14 kg/m. Physical Exam  Constitutional: She appears well-developed and well-nourished.  HENT:  Head: Normocephalic and atraumatic.  Eyes: Pupils are equal, round, and reactive to light. EOM are normal.  Neck: Normal range of motion. Neck supple. No JVD present. No thyromegaly present.  Cardiovascular: Normal rate.  Murmur heard. Irregular heart beats.   Pulmonary/Chest: Effort normal. She has no wheezes. She has no rales.  Abdominal: Soft. Bowel sounds are normal. She exhibits no distension. There is no tenderness. There is no rebound and no guarding.  Musculoskeletal: Normal range of motion.  Needs assistance for transfer. W/c for mobility.   Neurological: She is alert. No cranial nerve deficit. She exhibits normal muscle tone. Coordination normal.  Oriented to self.   Skin: Skin is warm and dry. There is erythema.   <1cm in diameter, superficial at the right buttock in the intergluteal cleft  Psychiatric: She has a normal mood and affect.    Labs reviewed: Recent Labs    05/19/17 06/15/17 06/16/17 0819 08/23/17  NA 142 142  --  144  K 4.1 4.7  --  4.1  CL  --   --   --  11  CO2  --   --  29 28  BUN 39* 32*  --  37*  CREATININE 1.2* 1.1  --  1.0  CALCIUM  --   --   --  9.0   Recent Labs    05/19/17 06/15/17 08/23/17  AST 16 16 14   ALT 8 8 6*  ALKPHOS 70 76 68  ALBUMIN  --   --  3.0   Recent Labs    06/15/17 08/23/17 08/30/17  WBC 3.7 4.5 5.5  HGB 10.7* 9.2* 10.3*  HCT 32* 38 32*  PLT 229 198 210   Lab Results  Component Value Date   TSH 0.90 08/23/2017   No results found for: HGBA1C No results found for: CHOL, HDL, LDLCALC, LDLDIRECT, TRIG, CHOLHDL  Significant Diagnostic Results in last 30 days:  No results found.  Assessment/Plan Pressure ulcer of right buttock, stage 2 <1cm in diameter, superficial at the right buttock in the intergluteal cleft, no s/s of infection, will apply hydrocolloid with silver dressing q3-5 days and prn until healed.   HTN (hypertension) Mildly elevated Sbp, asymptomatic, will monitor Bp P q shift x 5 days, then re-evaluate. Continue Diltiazem, Nebivolol 2.5mg  qd.   A-fib (HCC) Heart rate is in control, continue Nebivolol and Diltiazem. Will switch from Coumadin to Eliquis for anticoagulation therapy to reduce thromboembolic risk reduction.   Vascular dementia Continue SNF [facility] for safety and care assistance, continue Memantne 28mg  qd. Assist the patient with repositioning, incontinent care, and wound treatment. Moist and pressure reduction are must.   Anticoagulation goal of INR 2 to 3 Will switch from Coumadin to Eliquis. Dc Coumadin INR<2.0 11/03/17. Start Eliquis 2.5mg  po bid since the patient's age is >49 years old, wt<60 Kg. Risk vs benefit explained to the POA.   Incontinent of urine Assist the patient with incontinent care frequently to  avoid moist in the area to facilitate wound healing.      Family/ staff Communication: plan of care reviewed with the patient and charge nurse.   Labs/tests ordered:  none  Time spend 25 minutes.

## 2017-11-02 NOTE — Assessment & Plan Note (Signed)
Continue SNF [facility] for safety and care assistance, continue Memantne 28mg  qd. Assist the patient with repositioning, incontinent care, and wound treatment. Moist and pressure reduction are must.

## 2017-11-02 NOTE — Assessment & Plan Note (Signed)
Heart rate is in control, continue Nebivolol and Diltiazem. Will switch from Coumadin to Eliquis for anticoagulation therapy to reduce thromboembolic risk reduction.

## 2017-11-02 NOTE — Assessment & Plan Note (Addendum)
Will switch from Coumadin to Eliquis. Dc Coumadin INR<2.0 11/03/17. Start Eliquis 2.5mg  po bid since the patient's age is >82 years old, wt<60 Kg. Risk vs benefit explained to the POA.

## 2017-11-02 NOTE — Assessment & Plan Note (Signed)
Mildly elevated Sbp, asymptomatic, will monitor Bp P q shift x 5 days, then re-evaluate. Continue Diltiazem, Nebivolol 2.5mg  qd.

## 2017-11-02 NOTE — Assessment & Plan Note (Signed)
Assist the patient with incontinent care frequently to avoid moist in the area to facilitate wound healing.

## 2017-11-02 NOTE — Assessment & Plan Note (Signed)
<  1cm in diameter, superficial at the right buttock in the intergluteal cleft, no s/s of infection, will apply hydrocolloid with silver dressing q3-5 days and prn until healed.

## 2017-11-03 DIAGNOSIS — I481 Persistent atrial fibrillation: Secondary | ICD-10-CM | POA: Diagnosis not present

## 2017-11-04 ENCOUNTER — Encounter: Payer: Self-pay | Admitting: Nurse Practitioner

## 2017-11-07 ENCOUNTER — Encounter: Payer: Self-pay | Admitting: Nurse Practitioner

## 2017-11-07 ENCOUNTER — Non-Acute Institutional Stay (SKILLED_NURSING_FACILITY): Payer: PPO | Admitting: Nurse Practitioner

## 2017-11-07 DIAGNOSIS — E038 Other specified hypothyroidism: Secondary | ICD-10-CM | POA: Diagnosis not present

## 2017-11-07 DIAGNOSIS — R293 Abnormal posture: Secondary | ICD-10-CM | POA: Diagnosis not present

## 2017-11-07 DIAGNOSIS — E063 Autoimmune thyroiditis: Secondary | ICD-10-CM | POA: Diagnosis not present

## 2017-11-07 DIAGNOSIS — F01518 Vascular dementia, unspecified severity, with other behavioral disturbance: Secondary | ICD-10-CM

## 2017-11-07 DIAGNOSIS — I480 Paroxysmal atrial fibrillation: Secondary | ICD-10-CM | POA: Diagnosis not present

## 2017-11-07 DIAGNOSIS — K649 Unspecified hemorrhoids: Secondary | ICD-10-CM | POA: Diagnosis not present

## 2017-11-07 DIAGNOSIS — F0151 Vascular dementia with behavioral disturbance: Secondary | ICD-10-CM | POA: Diagnosis not present

## 2017-11-07 DIAGNOSIS — F418 Other specified anxiety disorders: Secondary | ICD-10-CM | POA: Diagnosis not present

## 2017-11-07 DIAGNOSIS — M6281 Muscle weakness (generalized): Secondary | ICD-10-CM | POA: Diagnosis not present

## 2017-11-07 DIAGNOSIS — I10 Essential (primary) hypertension: Secondary | ICD-10-CM

## 2017-11-07 DIAGNOSIS — Z9181 History of falling: Secondary | ICD-10-CM | POA: Diagnosis not present

## 2017-11-07 NOTE — Progress Notes (Signed)
Location:  [facility] Room Number: 68 Place of Service:  SNF (31) Provider:  Morgon Pamer, ManXie  NP  Blanchie Serve, MD  Patient Care Team: Blanchie Serve, MD as PCP - General (Internal Medicine) Zayvien Canning X, NP as Nurse Practitioner (Nurse Practitioner) Wilford Corner, MD as Consulting Physician (Gastroenterology) Sanda Klein, MD as Consulting Physician (Cardiology)  Extended Emergency Contact Information Primary Emergency Contact: [name],Pat Address: [place] [address] [name] of [place] Phone: [phone] Mobile Phone: [phone] Relation: Daughter  Code Status:  DNR Goals of care: Advanced Directive information Advanced Directives 10/25/2017  Does Patient Have a Medical Advance Directive? Yes  Type of Paramedic of [place];Out of facility DNR (pink MOST or yellow form)  Does patient want to make changes to medical advance directive? No - Patient declined  Copy of [place] in Chart? Yes  Pre-existing out of facility DNR order (yellow form or pink MOST form) Yellow form placed in chart (order not valid for inpatient use);Pink MOST form placed in chart (order not valid for inpatient use)     Chief Complaint  Patient presents with  . Medical Management of Chronic Issues    F/u-HTN, dementia, afib, pressure ulcer, anxiety with depression,    HPI:  Pt is a 84 y.o. female seen today for medical management of chronic diseases.     The patient has history of external hemorrhoids, no signs of irritation, bleeding, or infection. She has been refusing  hydrocortidsone cream bid. Hx of Afib, heart rate is in control, on Nebivolol 5mg  qd, Diltiazem 120mg  qd, Eliquid 2.5mg  bid po. Her mood is stabilizing on Quetiapine 12.5mg  qd, Lorazepam 0.5mg  bid prn, Escitalopram 20mg  qd. HPI was provided with assistance of staff, she resides in SNF [facility] for safety and care assistance, on  Memantine 28mg  qd for memory. Hypothyroidism, on Levothyroxine 9mcg po qd, last TSH 0.9/08/23/17.  Past Medical History:  Diagnosis Date  . Anemia, unspecified   . Anorexia   . Anxiety   . Arthritis   . Atherosclerotic cerebrovascular disease   . Backache, unspecified   . Cataract   . Cholelithiasis   . Chronic kidney disease, unspecified   . Closed fracture of right inferior pubic ramus ([place]) 11/12/2013   11/10/13 s/p fall, ED eval  X-ray R hip  1. Possible nondisplaced right inferior pubic ramus fracture.  2. No femur fracture or dislocation   02/06/14 dc prn Motrin and Norco-not used    . Depression   . Diaphragmatic hernia without mention of obstruction or gangrene   . Diseases of lips 03/26/2013  . Diverticulosis of colon (without mention of hemorrhage)   . Hemorrhage of rectum and anus 03/26/2013  . Hyperlipidemia   . Hypertension   . Insomnia, unspecified   . Laceration of occipital scalp 11/12/2013  . Macular degeneration 06/28/2013  . Macular degeneration (senile) of retina, unspecified   . Osteoporosis   . Other malaise and fatigue   . Other sleep disturbances   . Other specified cardiac dysrhythmias(427.89)   . Other specified personal history presenting hazards to health(V15.89)   . Other symptoms involving cardiovascular system   . Pancreatitis   . Personal history of other diseases of circulatory system   . Personal history of other diseases of digestive system    pancreatitis from gallstones  . Personal history of other diseases of respiratory system   . Personal history of  traumatic fracture   . Rosacea   . Senile osteoporosis    with old T11 fracture  . Thyroid disease   . Unspecified disorders of arteries and arterioles   . Unspecified hearing loss   . Unspecified hemorrhoids without mention of complication    Past Surgical History:  Procedure Laterality Date  . ABDOMINAL HYSTERECTOMY     and BSO fibroids  . COLONOSCOPY  05/23/2007  . EYE LID LIFT Right  09/2016   OD  . HIP ARTHROPLASTY Left 10/31/2016   Procedure: ARTHROPLASTY  HIP (HEMIARTHROPLASTY);  Surgeon: Paralee Cancel, MD;  Location: [facility];  Service: Orthopedics;  Laterality: Left;  . JOINT REPLACEMENT  1996  . TOTAL HIP ARTHROPLASTY Right     Allergies  Allergen Reactions  . Ace Inhibitors Cough  . Mirtazapine Other (See Comments)    Nightmares   . Penicillins Rash    Has patient had a PCN reaction causing immediate rash, facial/tongue/throat swelling, SOB or lightheadedness with hypotension: Yes Has patient had a PCN reaction causing severe rash involving mucus membranes or skin necrosis: Unknown Has patient had a PCN reaction that required hospitalization: Unknown Has patient had a PCN reaction occurring within the last 10 years: Unknown If all of the above answers are "NO", then may proceed with Cephalosporin use.     Outpatient Encounter Medications as of 11/07/2017  Medication Sig  . acetaminophen (TYLENOL) 500 MG tablet Take 500 mg by mouth every 8 (eight) hours as needed.  Marland Kitchen alum & mag hydroxide-simeth (MAALOX/MYLANTA) 200-200-20 MG/5ML suspension Take 30 mLs by mouth every 4 (four) hours as needed for indigestion.  Marland Kitchen apixaban (ELIQUIS) 2.5 MG TABS tablet Take 2.5 mg by mouth 2 (two) times daily.  . cholecalciferol 2000 units TABS Take 1 tablet (2,000 Units total) by mouth daily.  . clindamycin (CLEOCIN) 150 MG capsule Take 600 mg by mouth See admin instructions. ONE HOUR PRIOR TO DENTAL APPOINTMENTS  . diltiazem (CARDIZEM CD) 120 MG 24 hr capsule Take 120 mg by mouth daily.  Marland Kitchen escitalopram (LEXAPRO) 10 MG tablet Take 20 mg by mouth daily.   . hydrocortisone cream 1 % Apply 1 application topically 2 (two) times daily. TO HEMORRHOIDS  . hydroxypropyl methylcellulose / hypromellose (ISOPTO TEARS / GONIOVISC) 2.5 % ophthalmic solution Place 1 drop into both eyes 4 (four) times daily.  Marland Kitchen levothyroxine (SYNTHROID, LEVOTHROID) 25 MCG tablet Take 25 mcg by mouth daily before  breakfast.   . Lidocaine (ASPERCREME LIDOCAINE) 4 % PTCH Apply 1 patch topically at bedtime. APPLY TO LOWER BACK AND REMOVE AT 8PM  . loratadine (CLARITIN) 10 MG tablet Take 10 mg by mouth daily.   Marland Kitchen LORazepam (ATIVAN) 0.5 MG tablet Take 0.5 mg by mouth every 12 (twelve) hours as needed for anxiety.  . memantine (NAMENDA XR) 28 MG CP24 24 hr capsule Take 28 mg by mouth.   . nebivolol (BYSTOLIC) 5 MG tablet Take 5 mg by mouth daily.  . Nutritional Supplements (BENECALORIE PO) Take 1.5 oz by mouth 2 (two) times daily.   . ondansetron (ZOFRAN) 4 MG tablet Take 1 tablet (4 mg total) by mouth every 6 (six) hours as needed for nausea.  . polyethylene glycol (MIRALAX / GLYCOLAX) packet Take 17 g by mouth every other day. For constipation and hold for loose stools  . QUEtiapine (SEROQUEL) 25 MG tablet Take 12.5 mg by mouth daily at 2 PM.  . sennosides-docusate sodium (SENOKOT-S) 8.6-50 MG tablet Take 1 tablet by mouth daily.  . vitamin  B-12 (CYANOCOBALAMIN) 1000 MCG tablet Take 1,000 mcg by mouth daily.  . [DISCONTINUED] nebivolol (BYSTOLIC) 2.5 MG tablet Take 2.5 mg by mouth daily.  . [DISCONTINUED] warfarin (COUMADIN) 3 MG tablet Take 3 mg by mouth one time only at 6 PM. Patient is to take medication every other day.   No facility-administered encounter medications on file as of 11/07/2017.    ROS was provided with assistance of staff and POA daughter Review of Systems  Constitutional: Negative for activity change, appetite change, chills, diaphoresis, fatigue, fever and unexpected weight change.  HENT: Positive for hearing loss. Negative for congestion and voice change.   Respiratory: Negative for cough, shortness of breath and wheezing.   Cardiovascular: Negative for chest pain, palpitations and leg swelling.  Gastrointestinal: Negative for abdominal distention, abdominal pain, constipation, diarrhea, nausea and vomiting.  Genitourinary: Negative for difficulty urinating, dysuria and urgency.        Incontinent of urine.   Musculoskeletal: Positive for arthralgias and gait problem.  Skin: Positive for wound. Negative for color change.       Right buttock pressure ulcer.  Neurological: Negative for dizziness, speech difficulty, weakness and headaches.       Dementia.   Psychiatric/Behavioral: Positive for agitation, behavioral problems and confusion. Negative for hallucinations and sleep disturbance. The patient is nervous/anxious.     Immunization History  Administered Date(s) Administered  . DTaP 10/12/2012  . Influenza-Unspecified 11/22/2012, 12/26/2013, 11/12/2014, 12/09/2015, 12/14/2016  . PPD Test 12/06/2013  . Pneumococcal Conjugate-13 11/05/2016  . Pneumococcal Polysaccharide-23 09/22/2000  . Td 07/08/2005  . Tdap 11/10/2013   Pertinent  Health Maintenance Due  Topic Date Due  . INFLUENZA VACCINE  09/22/2017  . DEXA SCAN  Completed  . PNA vac Low Risk Adult  Completed   Fall Risk  10/25/2017 10/22/2016 01/13/2015 06/28/2013  Falls in the past year? No No No No  Risk for fall due to : - - Impaired balance/gait -   Functional Status Survey:    Vitals:   11/07/17 1244  BP: (!) 160/70  Pulse: 66  Resp: 20  Temp: 97.6 F (36.4 C)  SpO2: 94%  Weight: 118 lb 8 oz (53.8 kg)  Height: 5' (1.524 m)   Body mass index is 23.14 kg/m. Physical Exam  Constitutional: She appears well-developed and well-nourished.  HENT:  Head: Normocephalic and atraumatic.  Eyes: Pupils are equal, round, and reactive to light. EOM are normal.  Neck: Normal range of motion. Neck supple. No JVD present. No thyromegaly present.  Cardiovascular: Normal rate.  Murmur heard. Irregular heart beats.   Pulmonary/Chest: Effort normal and breath sounds normal. She has no wheezes. She has no rales.  Abdominal: Soft. Bowel sounds are normal. She exhibits no distension. There is no tenderness.  External hemorrhoids, no bleeding or inflammation.   Musculoskeletal: Normal range of motion. She  exhibits edema.  Trace edema BLE. Needs assistance for transfer. W/c for mobility.   Neurological: She is alert. No cranial nerve deficit. She exhibits normal muscle tone. Coordination normal.  Oriented to self.   Skin: Skin is warm and dry.  Intergluteal cleft superficial stage 2 pressure ulcer on the right side 0.2x0.1cm, no s/s of infection.   Psychiatric: She has a normal mood and affect. Her behavior is normal.    Labs reviewed: Recent Labs    05/19/17 06/15/17 06/16/17 0819 08/23/17  NA 142 142  --  144  K 4.1 4.7  --  4.1  CL  --   --   --  11  CO2  --   --  29 28  BUN 39* 32*  --  37*  CREATININE 1.2* 1.1  --  1.0  CALCIUM  --   --   --  9.0   Recent Labs    05/19/17 06/15/17 08/23/17  AST 16 16 14   ALT 8 8 6*  ALKPHOS 70 76 68  ALBUMIN  --   --  3.0   Recent Labs    06/15/17 08/23/17 08/30/17  WBC 3.7 4.5 5.5  HGB 10.7* 9.2* 10.3*  HCT 32* 38 32*  PLT 229 198 210   Lab Results  Component Value Date   TSH 0.90 08/23/2017   No results found for: HGBA1C No results found for: CHOL, HDL, LDLCALC, LDLDIRECT, TRIG, CHOLHDL  Significant Diagnostic Results in last 30 days:  No results found.  Assessment/Plan Hemorrhoids external hemorrhoids, no signs of irritation, bleeding, or infection. She has been refusing  hydrocortidsone cream bid. Will change 1% hydrocortisone cream bid prn to rectal/hemorrhoids area.  A-fib (HCC) Hx of Afib, heart rate is in control, contine Nebivolol 5mg  qd, Diltiazem 120mg  qd, Eliquid 2.5mg  bid po  Anxiety with depression Her mood is stabilizing, continue  Quetiapine 12.5mg  qd, Lorazepam 0.5mg  bid prn, Escitalopram 20mg  qd.   Vascular dementia HPI was provided with assistance of staff, she resides in SNF [facility] for safety and care assistance, continue Memantine 28mg  qd for memory.  Hypothyroidism Hypothyroidism, continue Levothyroxine 37mcg po qd, last TSH 0.9/08/23/17.    HTN (hypertension) Elevated Sbp 160/80mmHg,  11/08/17  146/41mmHg, she denied dizziness, change of vision, headache, chest pain/pressure, palpitation, SOB.continue to monitor Bp/P     Family/ staff Communication: plan of care reviewed with the patient and charge nurse.   Labs/tests ordered:  none  Time spend 25 minutes.

## 2017-11-07 NOTE — Assessment & Plan Note (Addendum)
external hemorrhoids, no signs of irritation, bleeding, or infection. She has been refusing  hydrocortidsone cream bid. Will change 1% hydrocortisone cream bid prn to rectal/hemorrhoids area.

## 2017-11-07 NOTE — Assessment & Plan Note (Signed)
Her mood is stabilizing, continue  Quetiapine 12.5mg  qd, Lorazepam 0.5mg  bid prn, Escitalopram 20mg  qd.

## 2017-11-07 NOTE — Assessment & Plan Note (Signed)
HPI was provided with assistance of staff, she resides in SNF [facility] for safety and care assistance, continue Memantine 28mg  qd for memory.

## 2017-11-07 NOTE — Assessment & Plan Note (Signed)
Hx of Afib, heart rate is in control, contine Nebivolol 5mg  qd, Diltiazem 120mg  qd, Eliquid 2.5mg  bid po

## 2017-11-07 NOTE — Assessment & Plan Note (Signed)
Hypothyroidism, continue Levothyroxine 37mcg po qd, last TSH 0.9/08/23/17.

## 2017-11-08 DIAGNOSIS — I481 Persistent atrial fibrillation: Secondary | ICD-10-CM | POA: Diagnosis not present

## 2017-11-08 NOTE — Assessment & Plan Note (Signed)
Elevated Sbp 160/50mmHg,  11/08/17 146/79mmHg, she denied dizziness, change of vision, headache, chest pain/pressure, palpitation, SOB.continue to monitor Bp/P

## 2017-11-14 ENCOUNTER — Encounter: Payer: Self-pay | Admitting: Nurse Practitioner

## 2017-11-14 ENCOUNTER — Non-Acute Institutional Stay (SKILLED_NURSING_FACILITY): Payer: PPO | Admitting: Nurse Practitioner

## 2017-11-14 DIAGNOSIS — F0151 Vascular dementia with behavioral disturbance: Secondary | ICD-10-CM

## 2017-11-14 DIAGNOSIS — F418 Other specified anxiety disorders: Secondary | ICD-10-CM

## 2017-11-14 DIAGNOSIS — F29 Unspecified psychosis not due to a substance or known physiological condition: Secondary | ICD-10-CM | POA: Diagnosis not present

## 2017-11-14 DIAGNOSIS — F01518 Vascular dementia, unspecified severity, with other behavioral disturbance: Secondary | ICD-10-CM

## 2017-11-14 NOTE — Assessment & Plan Note (Signed)
Continue SNF [facility] for safety and care assistance, continue Memantine 28mg  po daily. Observe.

## 2017-11-14 NOTE — Progress Notes (Signed)
Location:  [facility] Room Number: 77 Place of Service:  SNF (31) Provider:  Saron Tweed, ManXie  NP  Blanchie Serve, MD  Patient Care Team: Blanchie Serve, MD as PCP - General (Internal Medicine) Natosha Bou X, NP as Nurse Practitioner (Nurse Practitioner) Wilford Corner, MD as Consulting Physician (Gastroenterology) Sanda Klein, MD as Consulting Physician (Cardiology)  Extended Emergency Contact Information Primary Emergency Contact: [name],Pat Address: [place] [address] [name] of [place] Phone: [phone] Mobile Phone: [phone] Relation: Daughter  Code Status:  DNR Goals of care: Advanced Directive information Advanced Directives 10/25/2017  Does Patient Have a Medical Advance Directive? Yes  Type of Paramedic of [place];Out of facility DNR (pink MOST or yellow form)  Does patient want to make changes to medical advance directive? No - Patient declined  Copy of [place] in Chart? Yes  Pre-existing out of facility DNR order (yellow form or pink MOST form) Yellow form placed in chart (order not valid for inpatient use);Pink MOST form placed in chart (order not valid for inpatient use)     Chief Complaint  Patient presents with  . Acute Visit    Evaluation for Ativan    HPI:  Pt is a 84 y.o. female seen today for an acute visit for agitation and emotional outburst episodes, prn Lorazepam utilized al most daily with some efficacy. The patient has history of dementia, resides in SNF [facility] for safety and care assistance, on Memantine 28mg  qd for memroy. . She is Quetiapine 12.5mg  qd, Escitalpram 20mg  qd to stabilize her mood.    Past Medical History:  Diagnosis Date  . Anemia, unspecified   . Anorexia   . Anxiety   . Arthritis   . Atherosclerotic cerebrovascular disease   . Backache, unspecified   . Cataract   . Cholelithiasis   . Chronic kidney disease,  unspecified   . Closed fracture of right inferior pubic ramus ([place]) 11/12/2013   11/10/13 s/p fall, ED eval  X-ray R hip  1. Possible nondisplaced right inferior pubic ramus fracture.  2. No femur fracture or dislocation   02/06/14 dc prn Motrin and Norco-not used    . Depression   . Diaphragmatic hernia without mention of obstruction or gangrene   . Diseases of lips 03/26/2013  . Diverticulosis of colon (without mention of hemorrhage)   . Hemorrhage of rectum and anus 03/26/2013  . Hyperlipidemia   . Hypertension   . Insomnia, unspecified   . Laceration of occipital scalp 11/12/2013  . Macular degeneration 06/28/2013  . Macular degeneration (senile) of retina, unspecified   . Osteoporosis   . Other malaise and fatigue   . Other sleep disturbances   . Other specified cardiac dysrhythmias(427.89)   . Other specified personal history presenting hazards to health(V15.89)   . Other symptoms involving cardiovascular system   . Pancreatitis   . Personal history of other diseases of circulatory system   . Personal history of other diseases of digestive system    pancreatitis from gallstones  . Personal history of other diseases of respiratory system   . Personal history of traumatic fracture   . Rosacea   . Senile osteoporosis    with old T11 fracture  . Thyroid disease   . Unspecified disorders of arteries and arterioles   . Unspecified hearing loss   . Unspecified hemorrhoids without mention of complication    Past Surgical  History:  Procedure Laterality Date  . ABDOMINAL HYSTERECTOMY     and BSO fibroids  . COLONOSCOPY  05/23/2007  . EYE LID LIFT Right 09/2016   OD  . HIP ARTHROPLASTY Left 10/31/2016   Procedure: ARTHROPLASTY  HIP (HEMIARTHROPLASTY);  Surgeon: Paralee Cancel, MD;  Location: [facility];  Service: Orthopedics;  Laterality: Left;  . JOINT REPLACEMENT  1996  . TOTAL HIP ARTHROPLASTY Right     Allergies  Allergen Reactions  . Ace Inhibitors Cough  . Mirtazapine Other (See  Comments)    Nightmares   . Penicillins Rash    Has patient had a PCN reaction causing immediate rash, facial/tongue/throat swelling, SOB or lightheadedness with hypotension: Yes Has patient had a PCN reaction causing severe rash involving mucus membranes or skin necrosis: Unknown Has patient had a PCN reaction that required hospitalization: Unknown Has patient had a PCN reaction occurring within the last 10 years: Unknown If all of the above answers are "NO", then may proceed with Cephalosporin use.     Outpatient Encounter Medications as of 11/14/2017  Medication Sig  . acetaminophen (TYLENOL) 500 MG tablet Take 500 mg by mouth every 8 (eight) hours as needed.  Marland Kitchen alum & mag hydroxide-simeth (MAALOX/MYLANTA) 200-200-20 MG/5ML suspension Take 30 mLs by mouth every 4 (four) hours as needed for indigestion.  Marland Kitchen apixaban (ELIQUIS) 2.5 MG TABS tablet Take 2.5 mg by mouth 2 (two) times daily.  . clindamycin (CLEOCIN) 150 MG capsule Take 600 mg by mouth See admin instructions. ONE HOUR PRIOR TO DENTAL APPOINTMENTS  . diltiazem (CARDIZEM CD) 120 MG 24 hr capsule Take 120 mg by mouth daily.  Marland Kitchen escitalopram (LEXAPRO) 10 MG tablet Take 20 mg by mouth daily.   . hydrocortisone cream 1 % Apply 1 application topically 2 (two) times daily. TO HEMORRHOIDS  . hydroxypropyl methylcellulose / hypromellose (ISOPTO TEARS / GONIOVISC) 2.5 % ophthalmic solution Place 1 drop into both eyes 4 (four) times daily.  Marland Kitchen levothyroxine (SYNTHROID, LEVOTHROID) 25 MCG tablet Take 25 mcg by mouth daily before breakfast.   . Lidocaine (ASPERCREME LIDOCAINE) 4 % PTCH Apply 1 patch topically at bedtime. APPLY TO LOWER BACK AND REMOVE AT 8PM  . loratadine (CLARITIN) 10 MG tablet Take 10 mg by mouth daily.   Marland Kitchen LORazepam (ATIVAN) 0.5 MG tablet Take 0.5 mg by mouth every 12 (twelve) hours as needed for anxiety.  . memantine (NAMENDA XR) 28 MG CP24 24 hr capsule Take 28 mg by mouth.   . nebivolol (BYSTOLIC) 5 MG tablet Take 5 mg by  mouth daily.  . Nutritional Supplements (BENECALORIE PO) Take 1.5 oz by mouth 2 (two) times daily.   . ondansetron (ZOFRAN) 4 MG tablet Take 1 tablet (4 mg total) by mouth every 6 (six) hours as needed for nausea.  . polyethylene glycol (MIRALAX / GLYCOLAX) packet Take 17 g by mouth every other day. For constipation and hold for loose stools  . QUEtiapine (SEROQUEL) 25 MG tablet Take 12.5 mg by mouth daily at 2 PM.  . sennosides-docusate sodium (SENOKOT-S) 8.6-50 MG tablet Take 1 tablet by mouth daily.  . vitamin B-12 (CYANOCOBALAMIN) 1000 MCG tablet Take 1,000 mcg by mouth daily.  . [DISCONTINUED] cholecalciferol 2000 units TABS Take 1 tablet (2,000 Units total) by mouth daily.   No facility-administered encounter medications on file as of 11/14/2017.    ROS was provided with assistance of staff Review of Systems  Constitutional: Negative for activity change, appetite change, chills, diaphoresis, fatigue and fever.  HENT:  Positive for hearing loss. Negative for congestion and voice change.   Respiratory: Negative for cough, shortness of breath and wheezing.   Cardiovascular: Positive for leg swelling. Negative for chest pain and palpitations.  Gastrointestinal: Negative for abdominal distention and abdominal pain.  Genitourinary: Negative for difficulty urinating, dysuria and urgency.  Musculoskeletal: Positive for arthralgias and gait problem.  Neurological: Negative for dizziness, speech difficulty, weakness and headaches.       Dementia  Psychiatric/Behavioral: Positive for agitation, behavioral problems, confusion and sleep disturbance. Negative for hallucinations. The patient is nervous/anxious.     Immunization History  Administered Date(s) Administered  . DTaP 10/12/2012  . Influenza-Unspecified 11/22/2012, 12/26/2013, 11/12/2014, 12/09/2015, 12/14/2016  . PPD Test 12/06/2013  . Pneumococcal Conjugate-13 11/05/2016  . Pneumococcal Polysaccharide-23 09/22/2000  . Td 07/08/2005    . Tdap 11/10/2013   Pertinent  Health Maintenance Due  Topic Date Due  . INFLUENZA VACCINE  09/22/2017  . DEXA SCAN  Completed  . PNA vac Low Risk Adult  Completed   Fall Risk  10/25/2017 10/22/2016 01/13/2015 06/28/2013  Falls in the past year? No No No No  Risk for fall due to : - - Impaired balance/gait -   Functional Status Survey:    Vitals:   11/14/17 1335  BP: (!) 146/70  Pulse: 70  Resp: 20  Temp: 98 F (36.7 C)  SpO2: 95%  Weight: 119 lb 11.2 oz (54.3 kg)  Height: 5' (1.524 m)   Body mass index is 23.38 kg/m. Physical Exam  Constitutional: She appears well-developed and well-nourished.  HENT:  Head: Normocephalic and atraumatic.  Eyes: Pupils are equal, round, and reactive to light. EOM are normal.  Neck: Normal range of motion. Neck supple. No JVD present. No thyromegaly present.  Cardiovascular: Normal rate.  Murmur heard. Irregular heart beats.   Pulmonary/Chest: She has no wheezes. She has no rales.  Abdominal: Soft. Bowel sounds are normal.  Musculoskeletal: She exhibits edema.  Trace edema BLE. Needs assistance with transfer. W/c for mobility.   Neurological: She is alert. No cranial nerve deficit. She exhibits normal muscle tone. Coordination normal.  Oriented to self and her room on unit.   Psychiatric: She has a normal mood and affect.  Upon my visit.     Labs reviewed: Recent Labs    05/19/17 06/15/17 06/16/17 0819 08/23/17  NA 142 142  --  144  K 4.1 4.7  --  4.1  CL  --   --   --  11  CO2  --   --  29 28  BUN 39* 32*  --  37*  CREATININE 1.2* 1.1  --  1.0  CALCIUM  --   --   --  9.0   Recent Labs    05/19/17 06/15/17 08/23/17  AST 16 16 14   ALT 8 8 6*  ALKPHOS 70 76 68  ALBUMIN  --   --  3.0   Recent Labs    06/15/17 08/23/17 08/30/17  WBC 3.7 4.5 5.5  HGB 10.7* 9.2* 10.3*  HCT 32* 38 32*  PLT 229 198 210   Lab Results  Component Value Date   TSH 0.90 08/23/2017   No results found for: HGBA1C No results found for: CHOL,  HDL, LDLCALC, LDLDIRECT, TRIG, CHOLHDL  Significant Diagnostic Results in last 30 days:  No results found.  Assessment/Plan Psychosis (HCC) Yelling and emotional outbursts in setting of dementia, depression, anxiety, will increased Quetiapine to 25mg  po daily. Observe.   Anxiety with  depression Continue Escitalopram 20mg  qd, will have Lorazepam 0.5mg  q12hr prn available to her x 4 weeks. Observe.   Vascular dementia Continue SNF [facility] for safety and care assistance, continue Memantine 28mg  po daily. Observe.      Family/ staff Communication: plan of care reviewed with the patient and charge nurse.   Labs/tests ordered:  none  Time spend 25 minutes.

## 2017-11-14 NOTE — Assessment & Plan Note (Signed)
Continue Escitalopram 20mg  qd, will have Lorazepam 0.5mg  q12hr prn available to her x 4 weeks. Observe.

## 2017-11-14 NOTE — Assessment & Plan Note (Signed)
Yelling and emotional outbursts in setting of dementia, depression, anxiety, will increased Quetiapine to 25mg  po daily. Observe.

## 2017-11-15 ENCOUNTER — Encounter: Payer: Self-pay | Admitting: Nurse Practitioner

## 2017-12-05 ENCOUNTER — Non-Acute Institutional Stay (SKILLED_NURSING_FACILITY): Payer: PPO | Admitting: Family Medicine

## 2017-12-05 ENCOUNTER — Encounter: Payer: Self-pay | Admitting: Family Medicine

## 2017-12-05 DIAGNOSIS — I509 Heart failure, unspecified: Secondary | ICD-10-CM | POA: Diagnosis not present

## 2017-12-05 DIAGNOSIS — I10 Essential (primary) hypertension: Secondary | ICD-10-CM | POA: Diagnosis not present

## 2017-12-05 DIAGNOSIS — E063 Autoimmune thyroiditis: Secondary | ICD-10-CM | POA: Diagnosis not present

## 2017-12-05 DIAGNOSIS — F0151 Vascular dementia with behavioral disturbance: Secondary | ICD-10-CM

## 2017-12-05 DIAGNOSIS — E038 Other specified hypothyroidism: Secondary | ICD-10-CM | POA: Diagnosis not present

## 2017-12-05 DIAGNOSIS — W19XXXD Unspecified fall, subsequent encounter: Secondary | ICD-10-CM

## 2017-12-05 DIAGNOSIS — F01518 Vascular dementia, unspecified severity, with other behavioral disturbance: Secondary | ICD-10-CM

## 2017-12-05 NOTE — Progress Notes (Signed)
Provider:  Alain Honey, MD Location:  [place] Room Number: 65 Place of Service:  SNF ([phone])  PCP: Wardell Honour, MD Patient Care Team: Wardell Honour, MD as PCP - General (Family Medicine) Mast, Man X, NP as Nurse Practitioner (Nurse Practitioner) Wilford Corner, MD as Consulting Physician (Gastroenterology) Croitoru, Dani Gobble, MD as Consulting Physician (Cardiology)  Extended Emergency Contact Information Primary Emergency Contact: [name],Pat Address: [place] [address] [name] [place] Phone: [phone] Mobile Phone: [phone] Relation: Daughter  Code Status:  DNR Goals of Care: Advanced Directive information Advanced Directives 12/05/2017  Does Patient Have a Medical Advance Directive? Yes  Type of Paramedic of [place];Out of facility DNR (pink MOST or yellow form)  Does patient want to make changes to medical advance directive? No - Patient declined  Copy of [place] in Chart? Yes  Pre-existing out of facility DNR order (yellow form or pink MOST form) Yellow form placed in chart (order not valid for inpatient use);Pink MOST form placed in chart (order not valid for inpatient use)      Chief Complaint  Patient presents with  . Medical Management of Chronic Issues    F/u- HTN, anxiety, depression, dementia,     HPI: Patient is a 84 y.o. female seen today for medical management of chronic problems including hypertension and she with behavioral disturbance, and hypothyroidism nurses report some behavioral problems such as she has Ativan ordered as needed we discussed possibility of adding it as a scheduled medicine.  Daughter is already aware and would desire this change.  L yelling and refusing to cooperate with care mostly in the evenings.  Past Medical History:  Diagnosis Date  . Anemia, unspecified   . Anorexia   . Anxiety   . Arthritis   .  Atherosclerotic cerebrovascular disease   . Backache, unspecified   . Cataract   . Cholelithiasis   . Chronic kidney disease, unspecified   . Closed fracture of right inferior pubic ramus ([place]) 11/12/2013   11/10/13 s/p fall, ED eval  X-ray R hip  1. Possible nondisplaced right inferior pubic ramus fracture.  2. No femur fracture or dislocation   02/06/14 dc prn Motrin and Norco-not used    . Depression   . Diaphragmatic hernia without mention of obstruction or gangrene   . Diseases of lips 03/26/2013  . Diverticulosis of colon (without mention of hemorrhage)   . Hemorrhage of rectum and anus 03/26/2013  . Hyperlipidemia   . Hypertension   . Insomnia, unspecified   . Laceration of occipital scalp 11/12/2013  . Macular degeneration 06/28/2013  . Macular degeneration (senile) of retina, unspecified   . Osteoporosis   . Other malaise and fatigue   . Other sleep disturbances   . Other specified cardiac dysrhythmias(427.89)   . Other specified personal history presenting hazards to health(V15.89)   . Other symptoms involving cardiovascular system   . Pancreatitis   . Personal history of other diseases of circulatory system   . Personal history of other diseases of digestive system    pancreatitis from gallstones  . Personal history of other diseases of respiratory system   . Personal history of traumatic fracture   . Rosacea   . Senile osteoporosis    with old T11 fracture  . Thyroid disease   . Unspecified disorders of arteries and arterioles   . Unspecified hearing loss   .  Unspecified hemorrhoids without mention of complication    Past Surgical History:  Procedure Laterality Date  . ABDOMINAL HYSTERECTOMY     and BSO fibroids  . COLONOSCOPY  05/23/2007  . EYE LID LIFT Right 09/2016   OD  . HIP ARTHROPLASTY Left 10/31/2016   Procedure: ARTHROPLASTY  HIP (HEMIARTHROPLASTY);  Surgeon: Paralee Cancel, MD;  Location: [place];  Service: Orthopedics;  Laterality: Left;  . JOINT REPLACEMENT   1996  . TOTAL HIP ARTHROPLASTY Right     reports that she has never smoked. She has never used smokeless tobacco. She reports that she does not drink alcohol or use drugs. Social History   Socioeconomic History  . Marital status: Widowed    Spouse name: Not on file  . Number of children: Not on file  . Years of education: Not on file  . Highest education level: Not on file  Occupational History  . Occupation: retired Warden/ranger  . Financial resource strain: Not on file  . Food insecurity:    Worry: Not on file    Inability: Not on file  . Transportation needs:    Medical: Not on file    Non-medical: Not on file  Tobacco Use  . Smoking status: Never Smoker  . Smokeless tobacco: Never Used  Substance and Sexual Activity  . Alcohol use: No  . Drug use: No  . Sexual activity: Never  Lifestyle  . Physical activity:    Days per week: Not on file    Minutes per session: Not on file  . Stress: Not on file  Relationships  . Social connections:    Talks on phone: Not on file    Gets together: Not on file    Attends religious service: Not on file    Active member of club or organization: Not on file    Attends meetings of clubs or organizations: Not on file    Relationship status: Not on file  . Intimate partner violence:    Fear of current or ex partner: Not on file    Emotionally abused: Not on file    Physically abused: Not on file    Forced sexual activity: Not on file  Other Topics Concern  . Not on file  Social History Narrative   Lives at [facility] 2011, transferred to memory care 11/12/2013   Widowed   Caffeine   Exercise: none   Never smoked   Alcohol none   DNR    Functional Status Survey:    Family History  Problem Relation Age of Onset  . Heart disease Mother   . Diabetes Mother   . Heart disease Father   . Diabetes Sister   . Diabetes Brother   . Diabetes Daughter     Health Maintenance  Topic Date Due    . INFLUENZA VACCINE  09/22/2017  . TETANUS/TDAP  11/11/2023  . DEXA SCAN  Completed  . PNA vac Low Risk Adult  Completed    Allergies  Allergen Reactions  . Ace Inhibitors Cough  . Mirtazapine Other (See Comments)    Nightmares   . Penicillins Rash    Has patient had a PCN reaction causing immediate rash, facial/tongue/throat swelling, SOB or lightheadedness with hypotension: Yes Has patient had a PCN reaction causing severe rash involving mucus membranes or skin necrosis: Unknown Has patient had a PCN reaction that required hospitalization: Unknown Has patient had a PCN reaction occurring within the last 10 years:  Unknown If all of the above answers are "NO", then may proceed with Cephalosporin use.     Outpatient Encounter Medications as of 12/05/2017  Medication Sig  . acetaminophen (TYLENOL) 500 MG tablet Take 500 mg by mouth every 8 (eight) hours as needed.  Marland Kitchen alum & mag hydroxide-simeth (MAALOX/MYLANTA) 200-200-20 MG/5ML suspension Take 30 mLs by mouth every 4 (four) hours as needed for indigestion.  Marland Kitchen apixaban (ELIQUIS) 2.5 MG TABS tablet Take 2.5 mg by mouth 2 (two) times daily.  . clindamycin (CLEOCIN) 150 MG capsule Take 600 mg by mouth See admin instructions. ONE HOUR PRIOR TO DENTAL APPOINTMENTS  . diltiazem (CARDIZEM CD) 120 MG 24 hr capsule Take 120 mg by mouth daily.  Marland Kitchen escitalopram (LEXAPRO) 10 MG tablet Take 20 mg by mouth daily.   . hydrocortisone cream 1 % Apply 1 application topically 2 (two) times daily. TO HEMORRHOIDS  . hydroxypropyl methylcellulose / hypromellose (ISOPTO TEARS / GONIOVISC) 2.5 % ophthalmic solution Place 1 drop into both eyes 4 (four) times daily.  Marland Kitchen levothyroxine (SYNTHROID, LEVOTHROID) 25 MCG tablet Take 25 mcg by mouth daily before breakfast.   . Lidocaine (ASPERCREME LIDOCAINE) 4 % PTCH Apply 1 patch topically at bedtime. APPLY TO LOWER BACK AND REMOVE AT 8PM  . loratadine (CLARITIN) 10 MG tablet Take 10 mg by mouth daily.   Marland Kitchen LORazepam  (ATIVAN) 0.5 MG tablet Take 0.5 mg by mouth every 12 (twelve) hours as needed for anxiety.  . memantine (NAMENDA XR) 28 MG CP24 24 hr capsule Take 28 mg by mouth.   . nebivolol (BYSTOLIC) 5 MG tablet Take 5 mg by mouth daily.  . Nutritional Supplements (BENECALORIE PO) Take 1.5 oz by mouth 2 (two) times daily.   . ondansetron (ZOFRAN) 4 MG tablet Take 1 tablet (4 mg total) by mouth every 6 (six) hours as needed for nausea.  . polyethylene glycol (MIRALAX / GLYCOLAX) packet Take 17 g by mouth every other day. For constipation and hold for loose stools  . QUEtiapine (SEROQUEL) 25 MG tablet Take 12.5 mg by mouth daily at 2 PM.  . sennosides-docusate sodium (SENOKOT-S) 8.6-50 MG tablet Take 1 tablet by mouth daily.  . vitamin B-12 (CYANOCOBALAMIN) 1000 MCG tablet Take 1,000 mcg by mouth daily.   No facility-administered encounter medications on file as of 12/05/2017.     Review of Systems  Unable to perform ROS: Dementia  Constitutional: Negative.   Eyes: Negative.   Respiratory: Negative.   Cardiovascular: Negative.   Genitourinary: Negative.   Neurological: Negative.   Psychiatric/Behavioral: Positive for behavioral problems.    Vitals:   12/05/17 1621  BP: 140/70  Pulse: 70  Resp: 20  Temp: (!) 97.4 F (36.3 C)  SpO2: 95%  Weight: 118 lb 9.6 oz (53.8 kg)  Height: 5' (1.524 m)   Body mass index is 23.16 kg/m. Physical Exam  Constitutional: She appears well-developed and well-nourished.  Found patient seated in wheelchair in day room holding a baby doll  HENT:  Head: Normocephalic.  Mouth/Throat: Oropharynx is clear and moist.  Eyes: Pupils are equal, round, and reactive to light. Conjunctivae are normal.  Neck: Normal range of motion.  Cardiovascular: Normal rate and regular rhythm.  Murmur heard. Pulmonary/Chest: Effort normal and breath sounds normal.  Abdominal: Soft. Bowel sounds are normal.  Neurological: She is alert.  Oriented only to self  Skin: Skin is warm  and dry.  Psychiatric:  Patient has obvious dementia.  She is able to follow orders and answer  very simple basic questions but does not initiate conversation on her own.  Nursing note and vitals reviewed.   Labs reviewed: Basic Metabolic Panel: Recent Labs    05/19/17 06/15/17 06/16/17 0819 08/23/17  NA 142 142  --  144  K 4.1 4.7  --  4.1  CL  --   --   --  11  CO2  --   --  29 28  BUN 39* 32*  --  37*  CREATININE 1.2* 1.1  --  1.0  CALCIUM  --   --   --  9.0   Liver Function Tests: Recent Labs    05/19/17 06/15/17 08/23/17  AST 16 16 14   ALT 8 8 6*  ALKPHOS 70 76 68  ALBUMIN  --   --  3.0   No results for input(s): LIPASE, AMYLASE in the last 8760 hours. No results for input(s): AMMONIA in the last 8760 hours. CBC: Recent Labs    06/15/17 08/23/17 08/30/17  WBC 3.7 4.5 5.5  HGB 10.7* 9.2* 10.3*  HCT 32* 38 32*  PLT 229 198 210   Cardiac Enzymes: No results for input(s): CKTOTAL, CKMB, CKMBINDEX, TROPONINI in the last 8760 hours. BNP: Invalid input(s): POCBNP No results found for: HGBA1C Lab Results  Component Value Date   TSH 0.90 08/23/2017   Lab Results  Component Value Date   VITAMINB12 206 08/30/2017   Lab Results  Component Value Date   FOLATE 6.1 06/14/2017   Lab Results  Component Value Date   FERRITIN 194 06/15/2017    Imaging and Procedures obtained prior to SNF admission: Dg Chest 1 View  Result Date: 10/27/2016 CLINICAL DATA:  Fall from standing.  LEFT hip pain. EXAM: CHEST 1 VIEW COMPARISON:  Chest radiograph February 20, 2009 FINDINGS: Cardiac silhouette is moderately enlarged. Calcified aortic knob. Similar chronic interstitial changes. Hyperinflation. Calcified granulomas. No pleural effusion or focal consolidation. Apical pleural thickening is similar. No pneumothorax. Osteopenia. Old T8 and T11 compression fractures. IMPRESSION: Stable cardiomegaly and COPD. Aortic Atherosclerosis (ICD10-I70.0). Electronically Signed   By: Elon Alas M.D.   On: 10/27/2016 18:14   Ct Head Wo Contrast  Result Date: 10/27/2016 CLINICAL DATA:  Fall from standing position with left hip fracture EXAM: CT HEAD WITHOUT CONTRAST CT CERVICAL SPINE WITHOUT CONTRAST TECHNIQUE: Multidetector CT imaging of the head and cervical spine was performed following the standard protocol without intravenous contrast. Multiplanar CT image reconstructions of the cervical spine were also generated. COMPARISON:  11/10/2013 FINDINGS: CT HEAD FINDINGS Brain: No acute territorial infarction, hemorrhage, or intracranial mass is seen. Marked atrophy. Mild small vessel ischemic changes of the white matter. Stable small left sub insula hypodensities. Stable enlarged ventricles, felt secondary to atrophy. Vascular: No hyperdense vessels. Carotid artery calcifications. Vertebral artery calcification. Skull: No fracture Sinuses/Orbits: Mild mucosal thickening within the ethmoid sinuses. No acute orbital abnormality. Other: None CT CERVICAL SPINE FINDINGS Alignment: Trace retrolisthesis of C5 on C6, unchanged. Trace anterolisthesis C3 and C4 unchanged. Facet alignment within normal limits. Skull base and vertebrae: No acute fracture. No primary bone lesion or focal pathologic process. Soft tissues and spinal canal: No prevertebral fluid or swelling. No visible canal hematoma. Disc levels: Moderate severe degenerative changes at C4-C5, C5-C6 and C6-C7. Bilateral foraminal stenosis C4 through C7, most notable at C5 C6. Upper chest: Apical scarring.  Carotid artery calcification. Other: None IMPRESSION: 1. No CT evidence for acute intracranial abnormality. Atrophy and small vessel ischemic changes of the white matter 2. Stable  cervical spine alignment with degenerative changes. No acute osseous abnormality Electronically Signed   By: Donavan Foil M.D.   On: 10/27/2016 18:54   Ct Cervical Spine Wo Contrast  Result Date: 10/27/2016 CLINICAL DATA:  Fall from standing position with left hip  fracture EXAM: CT HEAD WITHOUT CONTRAST CT CERVICAL SPINE WITHOUT CONTRAST TECHNIQUE: Multidetector CT imaging of the head and cervical spine was performed following the standard protocol without intravenous contrast. Multiplanar CT image reconstructions of the cervical spine were also generated. COMPARISON:  11/10/2013 FINDINGS: CT HEAD FINDINGS Brain: No acute territorial infarction, hemorrhage, or intracranial mass is seen. Marked atrophy. Mild small vessel ischemic changes of the white matter. Stable small left sub insula hypodensities. Stable enlarged ventricles, felt secondary to atrophy. Vascular: No hyperdense vessels. Carotid artery calcifications. Vertebral artery calcification. Skull: No fracture Sinuses/Orbits: Mild mucosal thickening within the ethmoid sinuses. No acute orbital abnormality. Other: None CT CERVICAL SPINE FINDINGS Alignment: Trace retrolisthesis of C5 on C6, unchanged. Trace anterolisthesis C3 and C4 unchanged. Facet alignment within normal limits. Skull base and vertebrae: No acute fracture. No primary bone lesion or focal pathologic process. Soft tissues and spinal canal: No prevertebral fluid or swelling. No visible canal hematoma. Disc levels: Moderate severe degenerative changes at C4-C5, C5-C6 and C6-C7. Bilateral foraminal stenosis C4 through C7, most notable at C5 C6. Upper chest: Apical scarring.  Carotid artery calcification. Other: None IMPRESSION: 1. No CT evidence for acute intracranial abnormality. Atrophy and small vessel ischemic changes of the white matter 2. Stable cervical spine alignment with degenerative changes. No acute osseous abnormality Electronically Signed   By: Donavan Foil M.D.   On: 10/27/2016 18:54   Dg Hip Unilat With Pelvis 2-3 Views Left  Result Date: 10/27/2016 CLINICAL DATA:  Fall EXAM: DG HIP (WITH OR WITHOUT PELVIS) 2-3V LEFT COMPARISON:  None. FINDINGS: Fracture left femoral neck with mild displacement. Normal left hip joint Chronic fracture  right pubic rami. Right hip hemiarthroplasty. Lumbar disc degeneration IMPRESSION: Displaced fracture left femoral neck Electronically Signed   By: Franchot Gallo M.D.   On: 10/27/2016 18:13    Assessment/Plan 1. Essential hypertension Blood pressure today is 140/70.  Reviewing nursing notes pressures have generally been well controlled current meds.  2. Chronic congestive heart failure, unspecified heart failure type [facility]) Patient has no usual stigmata of congestive heart failure today.  Lungs are clear no dependent edema.  3. Hypothyroidism due to Hashimoto's thyroiditis Last TSH was within therapeutic range.  Continue same dose of thyroid replacement  4. Fall, subsequent encounter No record of recent falls  5. Vascular dementia with behavior disturbance ([place]) As noted above patient has had some problems with behaviors especially in the evening.  We will schedule Ativan 0.05 mg after supper and see if that helps with yelling and other behaviors   Family/ staff Communication:   Labs/tests ordered:  .smmsig

## 2017-12-06 ENCOUNTER — Encounter: Payer: Self-pay | Admitting: Family Medicine

## 2018-01-02 ENCOUNTER — Encounter: Payer: Self-pay | Admitting: Nurse Practitioner

## 2018-01-02 ENCOUNTER — Non-Acute Institutional Stay (SKILLED_NURSING_FACILITY): Payer: PPO | Admitting: Nurse Practitioner

## 2018-01-02 DIAGNOSIS — F0151 Vascular dementia with behavioral disturbance: Secondary | ICD-10-CM | POA: Diagnosis not present

## 2018-01-02 DIAGNOSIS — F01518 Vascular dementia, unspecified severity, with other behavioral disturbance: Secondary | ICD-10-CM

## 2018-01-02 DIAGNOSIS — F418 Other specified anxiety disorders: Secondary | ICD-10-CM

## 2018-01-02 DIAGNOSIS — F29 Unspecified psychosis not due to a substance or known physiological condition: Secondary | ICD-10-CM | POA: Diagnosis not present

## 2018-01-02 DIAGNOSIS — E038 Other specified hypothyroidism: Secondary | ICD-10-CM

## 2018-01-02 DIAGNOSIS — I480 Paroxysmal atrial fibrillation: Secondary | ICD-10-CM | POA: Diagnosis not present

## 2018-01-02 DIAGNOSIS — E063 Autoimmune thyroiditis: Secondary | ICD-10-CM

## 2018-01-02 DIAGNOSIS — K5900 Constipation, unspecified: Secondary | ICD-10-CM

## 2018-01-02 NOTE — Assessment & Plan Note (Signed)
Related to dementia, stabilized, reduce Seroquel 12.5mg /25mg  qd, observe for returning of targeted symptoms.

## 2018-01-02 NOTE — Assessment & Plan Note (Signed)
Stable, continue MiraLax qod, Senokot S qd.

## 2018-01-02 NOTE — Assessment & Plan Note (Signed)
Heart rate is in control, continue Diltiazem 120mg  qd, Bystolic 10mg  qd.

## 2018-01-02 NOTE — Assessment & Plan Note (Signed)
Stable, continue SNF [facility] for safety and care assistance. Continue Memantine 28mg  qd. Will decrease Seroquel 12.5mg /25mg  po daily. Observe for returning of targeted symptoms.

## 2018-01-02 NOTE — Progress Notes (Signed)
Location:  [place] Room Number: 27 Place of Service:  SNF (31) Provider: Estefania Kamiya, Lennie Odor  NP  Wardell Honour, MD  Patient Care Team: Wardell Honour, MD as PCP - General (Family Medicine) Devynn Hessler X, NP as Nurse Practitioner (Nurse Practitioner) Wilford Corner, MD as Consulting Physician (Gastroenterology) Sanda Klein, MD as Consulting Physician (Cardiology)  Extended Emergency Contact Information Primary Emergency Contact: [name],Pat Address: [place] [address] [name] of [place] Phone: [phone] Mobile Phone: [phone] Relation: Daughter  Code Status:  DNR Goals of care: Advanced Directive information Advanced Directives 01/02/2018  Does Patient Have a Medical Advance Directive? Yes  Type of Paramedic of [place];Out of facility DNR (pink MOST or yellow form)  Does patient want to make changes to medical advance directive? No - Patient declined  Copy of [place] in Chart? Yes - validated most recent copy scanned in chart (See row information)  Pre-existing out of facility DNR order (yellow form or pink MOST form) Yellow form placed in chart (order not valid for inpatient use);Pink MOST form placed in chart (order not valid for inpatient use)     Chief Complaint  Patient presents with  . Medical Management of Chronic Issues    F/u-HTN, CHF, dementia, hypothyroidism, depression, anxiety    HPI:  Pt is a 84 y.o. female seen today for medical management of chronic diseases.     The patient has dementia with behavioral disturbance, stabilized on Seroquel 25mg  qd, 12/23/17 last dose of prn Lorazepam used. She resides in [facility] for safety and care assistance, on Memantine 28mg  qd for memory. Her mood is stable, on Escitalopram 20mg  qd. Hx of Afib, heart rate is in control, on Diltiazem 120mg  qd, Bystolic 5mg  qd, Elliquis 2.5mg  bid. Hx of hypothyroidism, on  Levothyroxine 60mcg qd, last TSH 0.90 08/23/17. No constipation while taking Senokot S I qd, MiraLax qod.  Past Medical History:  Diagnosis Date  . Anemia, unspecified   . Anorexia   . Anxiety   . Arthritis   . Atherosclerotic cerebrovascular disease   . Backache, unspecified   . Cataract   . Cholelithiasis   . Chronic kidney disease, unspecified   . Closed fracture of right inferior pubic ramus ([place]) 11/12/2013   11/10/13 s/p fall, ED eval  X-ray R hip  1. Possible nondisplaced right inferior pubic ramus fracture.  2. No femur fracture or dislocation   02/06/14 dc prn Motrin and Norco-not used    . Depression   . Diaphragmatic hernia without mention of obstruction or gangrene   . Diseases of lips 03/26/2013  . Diverticulosis of colon (without mention of hemorrhage)   . Hemorrhage of rectum and anus 03/26/2013  . Hyperlipidemia   . Hypertension   . Insomnia, unspecified   . Laceration of occipital scalp 11/12/2013  . Macular degeneration 06/28/2013  . Macular degeneration (senile) of retina, unspecified   . Osteoporosis   . Other malaise and fatigue   . Other sleep disturbances   . Other specified cardiac dysrhythmias(427.89)   . Other specified personal history presenting hazards to health(V15.89)   . Other symptoms involving cardiovascular system   . Pancreatitis   . Personal history of other diseases of circulatory system   . Personal history of other diseases of digestive system    pancreatitis from gallstones  . Personal history of other diseases of respiratory system   .  Personal history of traumatic fracture   . Rosacea   . Senile osteoporosis    with old T11 fracture  . Thyroid disease   . Unspecified disorders of arteries and arterioles   . Unspecified hearing loss   . Unspecified hemorrhoids without mention of complication    Past Surgical History:  Procedure Laterality Date  . ABDOMINAL HYSTERECTOMY     and BSO fibroids  . COLONOSCOPY  05/23/2007  . EYE LID LIFT Right  09/2016   OD  . HIP ARTHROPLASTY Left 10/31/2016   Procedure: ARTHROPLASTY  HIP (HEMIARTHROPLASTY);  Surgeon: Paralee Cancel, MD;  Location: [place];  Service: Orthopedics;  Laterality: Left;  . JOINT REPLACEMENT  1996  . TOTAL HIP ARTHROPLASTY Right     Allergies  Allergen Reactions  . Ace Inhibitors Cough  . Mirtazapine Other (See Comments)    Nightmares   . Penicillins Rash    Has patient had a PCN reaction causing immediate rash, facial/tongue/throat swelling, SOB or lightheadedness with hypotension: Yes Has patient had a PCN reaction causing severe rash involving mucus membranes or skin necrosis: Unknown Has patient had a PCN reaction that required hospitalization: Unknown Has patient had a PCN reaction occurring within the last 10 years: Unknown If all of the above answers are "NO", then may proceed with Cephalosporin use.     Outpatient Encounter Medications as of 01/02/2018  Medication Sig  . acetaminophen (TYLENOL) 500 MG tablet Take 500 mg by mouth every 8 (eight) hours as needed.  Marland Kitchen alum & mag hydroxide-simeth (MAALOX/MYLANTA) 200-200-20 MG/5ML suspension Take 30 mLs by mouth every 4 (four) hours as needed for indigestion.  Marland Kitchen apixaban (ELIQUIS) 2.5 MG TABS tablet Take 2.5 mg by mouth 2 (two) times daily.  . Cholecalciferol (VITAMIN D3) 25 MCG (1000 UT) CAPS Take 2 capsules by mouth daily.  . clindamycin (CLEOCIN) 150 MG capsule Take 600 mg by mouth See admin instructions. ONE HOUR PRIOR TO DENTAL APPOINTMENTS  . diltiazem (CARDIZEM CD) 120 MG 24 hr capsule Take 120 mg by mouth daily.  Marland Kitchen escitalopram (LEXAPRO) 10 MG tablet Take 20 mg by mouth daily.   . hydrocortisone cream 1 % Apply 1 application topically 2 (two) times daily. TO HEMORRHOIDS  . hydroxypropyl methylcellulose / hypromellose (ISOPTO TEARS / GONIOVISC) 2.5 % ophthalmic solution Place 1 drop into both eyes 4 (four) times daily.  Marland Kitchen levothyroxine (SYNTHROID, LEVOTHROID) 25 MCG tablet Take 25 mcg by mouth daily before  breakfast.   . Lidocaine (ASPERCREME LIDOCAINE) 4 % PTCH Apply 1 patch topically at bedtime. APPLY TO LOWER BACK AND REMOVE AT 8PM  . loratadine (CLARITIN) 10 MG tablet Take 10 mg by mouth daily.   Marland Kitchen LORazepam (ATIVAN) 0.5 MG tablet Take 0.5 mg by mouth every 12 (twelve) hours as needed for anxiety.  . memantine (NAMENDA XR) 28 MG CP24 24 hr capsule Take 28 mg by mouth.   . nebivolol (BYSTOLIC) 5 MG tablet Take 5 mg by mouth daily.  . Nutritional Supplements (BENECALORIE PO) Take 1.5 oz by mouth 2 (two) times daily.   . ondansetron (ZOFRAN) 4 MG tablet Take 1 tablet (4 mg total) by mouth every 6 (six) hours as needed for nausea.  . polyethylene glycol (MIRALAX / GLYCOLAX) packet Take 17 g by mouth every other day. For constipation and hold for loose stools  . QUEtiapine (SEROQUEL) 25 MG tablet Take 12.5 mg by mouth daily at 2 PM.  . sennosides-docusate sodium (SENOKOT-S) 8.6-50 MG tablet Take 1 tablet by mouth  daily.  . vitamin B-12 (CYANOCOBALAMIN) 1000 MCG tablet Take 1,000 mcg by mouth daily.   No facility-administered encounter medications on file as of 01/02/2018.    ROS was provided with assistance of staff and the patient's daughter.  Review of Systems  Constitutional: Negative for activity change, appetite change, chills, diaphoresis, fatigue, fever and unexpected weight change.  HENT: Positive for hearing loss. Negative for congestion and voice change.   Respiratory: Negative for cough, shortness of breath and wheezing.   Gastrointestinal: Negative for abdominal distention, abdominal pain, constipation, diarrhea, nausea and vomiting.  Genitourinary: Negative for difficulty urinating, dysuria and urgency.  Musculoskeletal: Positive for arthralgias and gait problem.  Skin: Negative for color change and pallor.  Neurological: Negative for dizziness, speech difficulty, weakness and headaches.       Dementia  Psychiatric/Behavioral: Positive for confusion. Negative for agitation,  behavioral problems, hallucinations and sleep disturbance. The patient is not nervous/anxious.     Immunization History  Administered Date(s) Administered  . DTaP 10/12/2012  . Influenza Whole 11/26/2017  . Influenza-Unspecified 11/22/2012, 12/26/2013, 11/12/2014, 12/09/2015, 12/14/2016  . PPD Test 12/06/2013  . Pneumococcal Conjugate-13 11/05/2016  . Pneumococcal Polysaccharide-23 09/22/2000  . Td 07/08/2005  . Tdap 11/10/2013   Pertinent  Health Maintenance Due  Topic Date Due  . INFLUENZA VACCINE  Completed  . DEXA SCAN  Completed  . PNA vac Low Risk Adult  Completed   Fall Risk  10/25/2017 10/22/2016 01/13/2015 06/28/2013  Falls in the past year? No No No No  Risk for fall due to : - - Impaired balance/gait -   Functional Status Survey:    Vitals:   01/02/18 1103  BP: 122/70  Pulse: 60  Resp: 20  Temp: (!) 97.1 F (36.2 C)  SpO2: 92%  Weight: 117 lb 12.8 oz (53.4 kg)  Height: 5' (1.524 m)   Body mass index is 23.01 kg/m. Physical Exam  Constitutional: She appears well-developed and well-nourished.  HENT:  Head: Normocephalic and atraumatic.  Eyes: Pupils are equal, round, and reactive to light. EOM are normal.  Neck: Normal range of motion. Neck supple. No JVD present. No thyromegaly present.  Cardiovascular: Normal rate.  Murmur heard. Irregular heart beats.   Pulmonary/Chest: Effort normal. She has no wheezes. She has no rales.  Abdominal: Soft. She exhibits no distension. There is no tenderness. There is no rebound and no guarding.  Musculoskeletal: She exhibits no edema.  Needs assistance for transfer. W/c for mobility.   Neurological: She is alert. No cranial nerve deficit. She exhibits normal muscle tone. Coordination normal.  Oriented to self   Skin: Skin is warm and dry.  Psychiatric: She has a normal mood and affect. Her behavior is normal.    Labs reviewed: Recent Labs    05/19/17 06/15/17 06/16/17 0819 08/23/17  NA 142 142  --  144  K 4.1 4.7   --  4.1  CL  --   --   --  11  CO2  --   --  29 28  BUN 39* 32*  --  37*  CREATININE 1.2* 1.1  --  1.0  CALCIUM  --   --   --  9.0   Recent Labs    05/19/17 06/15/17 08/23/17  AST 16 16 14   ALT 8 8 6*  ALKPHOS 70 76 68  ALBUMIN  --   --  3.0   Recent Labs    06/15/17 08/23/17 08/30/17  WBC 3.7 4.5 5.5  HGB 10.7* 9.2*  10.3*  HCT 32* 38 32*  PLT 229 198 210   Lab Results  Component Value Date   TSH 0.90 08/23/2017   No results found for: HGBA1C No results found for: CHOL, HDL, LDLCALC, LDLDIRECT, TRIG, CHOLHDL  Significant Diagnostic Results in last 30 days:  No results found.  Assessment/Plan Vascular dementia ([place]) Stable, continue SNF [facility] for safety and care assistance. Continue Memantine 28mg  qd. Will decrease Seroquel 12.5mg /25mg  po daily. Observe for returning of targeted symptoms.   Hypothyroidism Stable, continue Levothyroxine 46mcg qd, last TSH wnl 0.9 08/23/17  Anxiety with depression Mood is stable, continue Escitalopram 20mg  qd, prn Lorazepam available to her, last used was 12/23/17.   Constipation Stable, continue MiraLax qod, Senokot S qd.   Psychosis ([place]) Related to dementia, stabilized, reduce Seroquel 12.5mg /25mg  qd, observe for returning of targeted symptoms.   A-fib (HCC) Heart rate is in control, continue Diltiazem 120mg  qd, Bystolic 10mg  qd.      Family/ staff Communication: plan of care reviewed with the patient and charge nurse.   Labs/tests ordered:  none  Time spend 25 minutes.

## 2018-01-02 NOTE — Assessment & Plan Note (Signed)
Mood is stable, continue Escitalopram 20mg  qd, prn Lorazepam available to her, last used was 12/23/17.

## 2018-01-02 NOTE — Assessment & Plan Note (Signed)
Stable, continue Levothyroxine 67mcg qd, last TSH wnl 0.9 08/23/17

## 2018-01-10 ENCOUNTER — Encounter: Payer: Self-pay | Admitting: Nurse Practitioner

## 2018-01-10 ENCOUNTER — Non-Acute Institutional Stay (SKILLED_NURSING_FACILITY): Payer: PPO | Admitting: Nurse Practitioner

## 2018-01-10 DIAGNOSIS — F418 Other specified anxiety disorders: Secondary | ICD-10-CM | POA: Diagnosis not present

## 2018-01-10 DIAGNOSIS — J069 Acute upper respiratory infection, unspecified: Secondary | ICD-10-CM | POA: Diagnosis not present

## 2018-01-10 DIAGNOSIS — F01518 Vascular dementia, unspecified severity, with other behavioral disturbance: Secondary | ICD-10-CM

## 2018-01-10 DIAGNOSIS — F0151 Vascular dementia with behavioral disturbance: Secondary | ICD-10-CM

## 2018-01-10 DIAGNOSIS — F29 Unspecified psychosis not due to a substance or known physiological condition: Secondary | ICD-10-CM | POA: Diagnosis not present

## 2018-01-10 DIAGNOSIS — J209 Acute bronchitis, unspecified: Secondary | ICD-10-CM | POA: Insufficient documentation

## 2018-01-10 NOTE — Assessment & Plan Note (Signed)
Stable, continue Quetiapine 12.5mg  qd, successful GDR, observe for returning of targeted symptoms.

## 2018-01-10 NOTE — Assessment & Plan Note (Signed)
Prn Tylenol is available to her. Azithromycin 500mg  po day 1, then 250mg  po qd day 2-5. Observe.

## 2018-01-10 NOTE — Assessment & Plan Note (Signed)
Her mood is stable, continue Escitalopram 20mg  qd, prn Lorazepam.

## 2018-01-10 NOTE — Assessment & Plan Note (Signed)
Continue SNF [facility] for safety and care assistance. Continue Memantine 28mg  po qd for memory.

## 2018-01-10 NOTE — Progress Notes (Signed)
Location:  [place] Room Number: 85 Place of Service:  SNF (31) Provider:  Jairo Bellew, Lennie Odor  NP  Wardell Honour, MD  Patient Care Team: Wardell Honour, MD as PCP - General (Family Medicine) Kermit Arnette X, NP as Nurse Practitioner (Nurse Practitioner) Wilford Corner, MD as Consulting Physician (Gastroenterology) Sanda Klein, MD as Consulting Physician (Cardiology)  Extended Emergency Contact Information Primary Emergency Contact: [name],Pat Address: [place] [address] [name] of [place] Phone: [phone] Mobile Phone: [phone] Relation: Daughter  Code Status:  DNR Goals of care: Advanced Directive information Advanced Directives 01/02/2018  Does Patient Have a Medical Advance Directive? Yes  Type of Paramedic of [place];Out of facility DNR (pink MOST or yellow form)  Does patient want to make changes to medical advance directive? No - Patient declined  Copy of [place] in Chart? Yes - validated most recent copy scanned in chart (See row information)  Pre-existing out of facility DNR order (yellow form or pink MOST form) Yellow form placed in chart (order not valid for inpatient use);Pink MOST form placed in chart (order not valid for inpatient use)     Chief Complaint  Patient presents with  . Acute Visit    C/o- cold symptoms----> productive cough with yello/greenish phlegm    HPI:  Pt is a 84 y.o. female seen today for an acute visit for running nose, productive cough x1 day, noted yellow phlegm, low grade T 99.1. She denied sore throat, chest pain/pressure, or palpitation.  HPI was provided with assistance of staff due to her dementia. She resides in [facility] for safety and care assistance. She takes Memantine 28mg  qd, Quetiapine 12.5mg  qd, Escitalopram 20mg , prn Lorazepam 0.5mg  q12hr for memory and mood.    Past Medical History:  Diagnosis Date  .  Anemia, unspecified   . Anorexia   . Anxiety   . Arthritis   . Atherosclerotic cerebrovascular disease   . Backache, unspecified   . Cataract   . Cholelithiasis   . Chronic kidney disease, unspecified   . Closed fracture of right inferior pubic ramus ([place]) 11/12/2013   11/10/13 s/p fall, ED eval  X-ray R hip  1. Possible nondisplaced right inferior pubic ramus fracture.  2. No femur fracture or dislocation   02/06/14 dc prn Motrin and Norco-not used    . Depression   . Diaphragmatic hernia without mention of obstruction or gangrene   . Diseases of lips 03/26/2013  . Diverticulosis of colon (without mention of hemorrhage)   . Hemorrhage of rectum and anus 03/26/2013  . Hyperlipidemia   . Hypertension   . Insomnia, unspecified   . Laceration of occipital scalp 11/12/2013  . Macular degeneration 06/28/2013  . Macular degeneration (senile) of retina, unspecified   . Osteoporosis   . Other malaise and fatigue   . Other sleep disturbances   . Other specified cardiac dysrhythmias(427.89)   . Other specified personal history presenting hazards to health(V15.89)   . Other symptoms involving cardiovascular system   . Pancreatitis   . Personal history of other diseases of circulatory system   . Personal history of other diseases of digestive system    pancreatitis from gallstones  . Personal history of other diseases of respiratory system   . Personal history of traumatic fracture   . Rosacea   . Senile osteoporosis    with old T11 fracture  .  Thyroid disease   . Unspecified disorders of arteries and arterioles   . Unspecified hearing loss   . Unspecified hemorrhoids without mention of complication    Past Surgical History:  Procedure Laterality Date  . ABDOMINAL HYSTERECTOMY     and BSO fibroids  . COLONOSCOPY  05/23/2007  . EYE LID LIFT Right 09/2016   OD  . HIP ARTHROPLASTY Left 10/31/2016   Procedure: ARTHROPLASTY  HIP (HEMIARTHROPLASTY);  Surgeon: Paralee Cancel, MD;  Location: [place];   Service: Orthopedics;  Laterality: Left;  . JOINT REPLACEMENT  1996  . TOTAL HIP ARTHROPLASTY Right     Allergies  Allergen Reactions  . Ace Inhibitors Cough  . Mirtazapine Other (See Comments)    Nightmares   . Penicillins Rash    Has patient had a PCN reaction causing immediate rash, facial/tongue/throat swelling, SOB or lightheadedness with hypotension: Yes Has patient had a PCN reaction causing severe rash involving mucus membranes or skin necrosis: Unknown Has patient had a PCN reaction that required hospitalization: Unknown Has patient had a PCN reaction occurring within the last 10 years: Unknown If all of the above answers are "NO", then may proceed with Cephalosporin use.     Outpatient Encounter Medications as of 01/10/2018  Medication Sig  . acetaminophen (TYLENOL) 500 MG tablet Take 500 mg by mouth every 8 (eight) hours as needed.  Marland Kitchen alum & mag hydroxide-simeth (MAALOX/MYLANTA) 200-200-20 MG/5ML suspension Take 30 mLs by mouth every 4 (four) hours as needed for indigestion.  Marland Kitchen apixaban (ELIQUIS) 2.5 MG TABS tablet Take 2.5 mg by mouth 2 (two) times daily.  . Cholecalciferol (VITAMIN D3) 25 MCG (1000 UT) CAPS Take 2 capsules by mouth daily.  . clindamycin (CLEOCIN) 150 MG capsule Take 600 mg by mouth See admin instructions. ONE HOUR PRIOR TO DENTAL APPOINTMENTS  . diltiazem (CARDIZEM CD) 120 MG 24 hr capsule Take 120 mg by mouth daily.  Marland Kitchen escitalopram (LEXAPRO) 10 MG tablet Take 20 mg by mouth daily.   . hydrocortisone cream 1 % Apply 1 application topically 2 (two) times daily. TO HEMORRHOIDS  . hydroxypropyl methylcellulose / hypromellose (ISOPTO TEARS / GONIOVISC) 2.5 % ophthalmic solution Place 1 drop into both eyes 4 (four) times daily.  Marland Kitchen levothyroxine (SYNTHROID, LEVOTHROID) 25 MCG tablet Take 25 mcg by mouth daily before breakfast.   . Lidocaine (ASPERCREME LIDOCAINE) 4 % PTCH Apply 1 patch topically daily. APPLY TO LOWER BACK AND REMOVE AT 8PM  . loratadine  (CLARITIN) 10 MG tablet Take 10 mg by mouth daily.   Marland Kitchen LORazepam (ATIVAN) 0.5 MG tablet Take 0.5 mg by mouth every 12 (twelve) hours as needed for anxiety.  . memantine (NAMENDA XR) 28 MG CP24 24 hr capsule Take 28 mg by mouth.   . nebivolol (BYSTOLIC) 5 MG tablet Take 5 mg by mouth daily.  . Nutritional Supplements (BENECALORIE PO) Take 1.5 oz by mouth 2 (two) times daily.   . ondansetron (ZOFRAN) 4 MG tablet Take 1 tablet (4 mg total) by mouth every 6 (six) hours as needed for nausea.  . polyethylene glycol (MIRALAX / GLYCOLAX) packet Take 17 g by mouth every other day. For constipation and hold for loose stools  . QUEtiapine (SEROQUEL) 25 MG tablet Take 12.5 mg by mouth daily at 2 PM.  . sennosides-docusate sodium (SENOKOT-S) 8.6-50 MG tablet Take 1 tablet by mouth daily.  . vitamin B-12 (CYANOCOBALAMIN) 1000 MCG tablet Take 1,000 mcg by mouth daily.   No facility-administered encounter medications on file as  of 01/10/2018.    ROS was provided with assistance of staff Review of Systems  Constitutional: Positive for fatigue and fever. Negative for activity change, appetite change, chills and diaphoresis.  HENT: Positive for congestion, hearing loss and rhinorrhea. Negative for sinus pressure, sinus pain, sore throat and voice change.   Respiratory: Positive for cough. Negative for shortness of breath and wheezing.   Cardiovascular: Negative for chest pain, palpitations and leg swelling.  Gastrointestinal: Negative for abdominal distention, abdominal pain, constipation, diarrhea, nausea and vomiting.  Genitourinary: Negative for difficulty urinating, dysuria and urgency.  Musculoskeletal: Positive for arthralgias and gait problem.  Skin: Negative for color change and pallor.  Neurological: Negative for dizziness, speech difficulty, weakness and headaches.       Dementia  Psychiatric/Behavioral: Positive for confusion. Negative for agitation, behavioral problems, hallucinations and sleep  disturbance. The patient is not nervous/anxious.     Immunization History  Administered Date(s) Administered  . DTaP 10/12/2012  . Influenza Whole 11/26/2017  . Influenza-Unspecified 11/22/2012, 12/26/2013, 11/12/2014, 12/09/2015, 12/14/2016  . PPD Test 12/06/2013  . Pneumococcal Conjugate-13 11/05/2016  . Pneumococcal Polysaccharide-23 09/22/2000  . Td 07/08/2005  . Tdap 11/10/2013   Pertinent  Health Maintenance Due  Topic Date Due  . INFLUENZA VACCINE  Completed  . DEXA SCAN  Completed  . PNA vac Low Risk Adult  Completed   Fall Risk  10/25/2017 10/22/2016 01/13/2015 06/28/2013  Falls in the past year? No No No No  Risk for fall due to : - - Impaired balance/gait -   Functional Status Survey:    Vitals:   01/10/18 1205  BP: 130/62  Pulse: 76  Resp: 16  Temp: 99.1 F (37.3 C)  SpO2: 91%  Weight: 117 lb 12.8 oz (53.4 kg)  Height: 5' (1.524 m)   Body mass index is 23.01 kg/m. Physical Exam  Constitutional: She appears well-developed and well-nourished.  HENT:  Head: Normocephalic and atraumatic.  Mouth/Throat: No oropharyngeal exudate.  Mild erythema in throat.   Eyes: Pupils are equal, round, and reactive to light. EOM are normal.  Neck: Normal range of motion. Neck supple. No JVD present. No thyromegaly present.  Cardiovascular: Normal rate.  Murmur heard. Irregular heart beats.   Pulmonary/Chest: She has no wheezes. She has no rales.  Abdominal: Soft. She exhibits no distension. There is no tenderness. There is no rebound and no guarding.  Musculoskeletal: She exhibits no edema.  Needs assistance for transfer. W/c for mobility.   Neurological: She is alert. No cranial nerve deficit. She exhibits normal muscle tone. Coordination normal.  Oriented to self.  Skin: Skin is warm and dry.  Psychiatric: She has a normal mood and affect. Her behavior is normal.    Labs reviewed: Recent Labs    05/19/17 06/15/17 06/16/17 0819 08/23/17  NA 142 142  --  144  K 4.1  4.7  --  4.1  CL  --   --   --  11  CO2  --   --  29 28  BUN 39* 32*  --  37*  CREATININE 1.2* 1.1  --  1.0  CALCIUM  --   --   --  9.0   Recent Labs    05/19/17 06/15/17 08/23/17  AST 16 16 14   ALT 8 8 6*  ALKPHOS 70 76 68  ALBUMIN  --   --  3.0   Recent Labs    06/15/17 08/23/17 08/30/17  WBC 3.7 4.5 5.5  HGB 10.7* 9.2* 10.3*  HCT 32* 38 32*  PLT 229 198 210   Lab Results  Component Value Date   TSH 0.90 08/23/2017   No results found for: HGBA1C No results found for: CHOL, HDL, LDLCALC, LDLDIRECT, TRIG, CHOLHDL  Significant Diagnostic Results in last 30 days:  No results found.  Assessment/Plan URI (upper respiratory infection) Prn Tylenol is available to her. Azithromycin 500mg  po day 1, then 250mg  po qd day 2-5. Observe.   Vascular dementia ([place]) Continue SNF [facility] for safety and care assistance. Continue Memantine 28mg  po qd for memory.   Anxiety with depression Her mood is stable, continue Escitalopram 20mg  qd, prn Lorazepam.   Psychosis (HCC) Stable, continue Quetiapine 12.5mg  qd, successful GDR, observe for returning of targeted symptoms.       Family/ staff Communication: plan of care reviewed with the patient and charge nurse.   Labs/tests ordered:  none  Time spend 25 minutes.

## 2018-01-31 ENCOUNTER — Non-Acute Institutional Stay (SKILLED_NURSING_FACILITY): Payer: PPO | Admitting: Nurse Practitioner

## 2018-01-31 ENCOUNTER — Encounter: Payer: Self-pay | Admitting: Nurse Practitioner

## 2018-01-31 DIAGNOSIS — F0151 Vascular dementia with behavioral disturbance: Secondary | ICD-10-CM

## 2018-01-31 DIAGNOSIS — F418 Other specified anxiety disorders: Secondary | ICD-10-CM

## 2018-01-31 DIAGNOSIS — F29 Unspecified psychosis not due to a substance or known physiological condition: Secondary | ICD-10-CM | POA: Diagnosis not present

## 2018-01-31 DIAGNOSIS — E038 Other specified hypothyroidism: Secondary | ICD-10-CM

## 2018-01-31 DIAGNOSIS — D638 Anemia in other chronic diseases classified elsewhere: Secondary | ICD-10-CM | POA: Diagnosis not present

## 2018-01-31 DIAGNOSIS — I480 Paroxysmal atrial fibrillation: Secondary | ICD-10-CM | POA: Diagnosis not present

## 2018-01-31 DIAGNOSIS — E063 Autoimmune thyroiditis: Secondary | ICD-10-CM

## 2018-01-31 DIAGNOSIS — I10 Essential (primary) hypertension: Secondary | ICD-10-CM

## 2018-01-31 DIAGNOSIS — F01518 Vascular dementia, unspecified severity, with other behavioral disturbance: Secondary | ICD-10-CM

## 2018-01-31 NOTE — Assessment & Plan Note (Signed)
Blood pressure is controlled, continue Nebivolol 5mg  qd, Diltiazepm 120mg  qd.

## 2018-01-31 NOTE — Assessment & Plan Note (Signed)
Stable, last Hgb 10.3 08/30/17, continue Vit B12 1054mcg po daily.

## 2018-01-31 NOTE — Progress Notes (Signed)
Location:  [facility] Room Number: 40 Place of Service:  SNF (31) Provider:  Marlana Latus  NP  Wardell Honour, MD  Patient Care Team: Wardell Honour, MD as PCP - General (Family Medicine) Janita Camberos X, NP as Nurse Practitioner (Nurse Practitioner) Wilford Corner, MD as Consulting Physician (Gastroenterology) Sanda Klein, MD as Consulting Physician (Cardiology)  Extended Emergency Contact Information Primary Emergency Contact: [name],Pat Address: [place] [address] [name] of [place] Phone: [phone] Mobile Phone: [phone] Relation: Daughter  Code Status:  DNR Goals of care: Advanced Directive information Advanced Directives 01/31/2018  Does Patient Have a Medical Advance Directive? Yes  Type of Paramedic of [place];Out of facility DNR (pink MOST or yellow form)  Does patient want to make changes to medical advance directive? No - Patient declined  Copy of [place] in Chart? Yes - validated most recent copy scanned in chart (See row information)  Pre-existing out of facility DNR order (yellow form or pink MOST form) Yellow form placed in chart (order not valid for inpatient use);Pink MOST form placed in chart (order not valid for inpatient use)     Chief Complaint  Patient presents with  . Medical Management of Chronic Issues    F/u- vascular dementia, anxiety, depression, HTN, constipation    HPI:  Pt is a 84 y.o. female seen today for medical management of chronic diseases.     The patient has history of Afib, heart rate is in control, on Nebivolol 5mg  qd, Diltiazem 120mg  qd, Eliquis 2.5mg  bid. Anemia, is stable, on Vit B12 1048mcg qd, last Hgb 10.3 08/30/17. No constipation while on Senokot S qd, MiraLax qod. Stable psychosis, on Seroquel 12.5mg  qd. Her mood is stable eon Escitalopram 20mg  qd. She resides in [facility] for safety and care assistance, on  Memantine 24mg  qd for memory. Hypothyroidism, on Levothyroxine 39mcg qd, last TSH 0.90 08/23/17.   Past Medical History:  Diagnosis Date  . Anemia, unspecified   . Anorexia   . Anxiety   . Arthritis   . Atherosclerotic cerebrovascular disease   . Backache, unspecified   . Cataract   . Cholelithiasis   . Chronic kidney disease, unspecified   . Closed fracture of right inferior pubic ramus ([place]) 11/12/2013   11/10/13 s/p fall, ED eval  X-ray R hip  1. Possible nondisplaced right inferior pubic ramus fracture.  2. No femur fracture or dislocation   02/06/14 dc prn Motrin and Norco-not used    . Depression   . Diaphragmatic hernia without mention of obstruction or gangrene   . Diseases of lips 03/26/2013  . Diverticulosis of colon (without mention of hemorrhage)   . Hemorrhage of rectum and anus 03/26/2013  . Hyperlipidemia   . Hypertension   . Insomnia, unspecified   . Laceration of occipital scalp 11/12/2013  . Macular degeneration 06/28/2013  . Macular degeneration (senile) of retina, unspecified   . Osteoporosis   . Other malaise and fatigue   . Other sleep disturbances   . Other specified cardiac dysrhythmias(427.89)   . Other specified personal history presenting hazards to health(V15.89)   . Other symptoms involving cardiovascular system   . Pancreatitis   . Personal history of other diseases of circulatory system   . Personal history of other diseases of digestive system    pancreatitis from gallstones  . Personal history of other diseases of respiratory system   .  Personal history of traumatic fracture   . Rosacea   . Senile osteoporosis    with old T11 fracture  . Thyroid disease   . Unspecified disorders of arteries and arterioles   . Unspecified hearing loss   . Unspecified hemorrhoids without mention of complication    Past Surgical History:  Procedure Laterality Date  . ABDOMINAL HYSTERECTOMY     and BSO fibroids  . COLONOSCOPY  05/23/2007  . EYE LID LIFT Right  09/2016   OD  . HIP ARTHROPLASTY Left 10/31/2016   Procedure: ARTHROPLASTY  HIP (HEMIARTHROPLASTY);  Surgeon: Paralee Cancel, MD;  Location: [place];  Service: Orthopedics;  Laterality: Left;  . JOINT REPLACEMENT  1996  . TOTAL HIP ARTHROPLASTY Right     Allergies  Allergen Reactions  . Ace Inhibitors Cough  . Mirtazapine Other (See Comments)    Nightmares   . Penicillins Rash    Has patient had a PCN reaction causing immediate rash, facial/tongue/throat swelling, SOB or lightheadedness with hypotension: Yes Has patient had a PCN reaction causing severe rash involving mucus membranes or skin necrosis: Unknown Has patient had a PCN reaction that required hospitalization: Unknown Has patient had a PCN reaction occurring within the last 10 years: Unknown If all of the above answers are "NO", then may proceed with Cephalosporin use.     Outpatient Encounter Medications as of 01/31/2018  Medication Sig  . acetaminophen (TYLENOL) 500 MG tablet Take 500 mg by mouth every 8 (eight) hours as needed.  Marland Kitchen alum & mag hydroxide-simeth (MAALOX/MYLANTA) 200-200-20 MG/5ML suspension Take 30 mLs by mouth every 4 (four) hours as needed for indigestion.  Marland Kitchen apixaban (ELIQUIS) 2.5 MG TABS tablet Take 2.5 mg by mouth 2 (two) times daily.  . Cholecalciferol (VITAMIN D3) 25 MCG (1000 UT) CAPS Take 2 capsules by mouth daily.  . clindamycin (CLEOCIN) 150 MG capsule Take 600 mg by mouth See admin instructions. ONE HOUR PRIOR TO DENTAL APPOINTMENTS  . diltiazem (CARDIZEM CD) 120 MG 24 hr capsule Take 120 mg by mouth daily.  Marland Kitchen escitalopram (LEXAPRO) 10 MG tablet Take 20 mg by mouth daily.   . hydrocortisone cream 1 % Apply 1 application topically 2 (two) times daily. TO HEMORRHOIDS  . hydroxypropyl methylcellulose / hypromellose (ISOPTO TEARS / GONIOVISC) 2.5 % ophthalmic solution Place 1 drop into both eyes 4 (four) times daily.  Marland Kitchen levothyroxine (SYNTHROID, LEVOTHROID) 25 MCG tablet Take 25 mcg by mouth daily before  breakfast.   . Lidocaine (ASPERCREME LIDOCAINE) 4 % PTCH Apply 1 patch topically daily. APPLY TO LOWER BACK AND REMOVE AT 8PM  . loratadine (CLARITIN) 10 MG tablet Take 10 mg by mouth daily.   . memantine (NAMENDA XR) 28 MG CP24 24 hr capsule Take 28 mg by mouth.   . nebivolol (BYSTOLIC) 5 MG tablet Take 5 mg by mouth daily.  . Nutritional Supplements (BENECALORIE PO) Take 1.5 oz by mouth 2 (two) times daily.   . ondansetron (ZOFRAN) 4 MG tablet Take 1 tablet (4 mg total) by mouth every 6 (six) hours as needed for nausea.  . polyethylene glycol (MIRALAX / GLYCOLAX) packet Take 17 g by mouth every other day. For constipation and hold for loose stools  . QUEtiapine (SEROQUEL) 25 MG tablet Take 12.5 mg by mouth daily at 2 PM.  . sennosides-docusate sodium (SENOKOT-S) 8.6-50 MG tablet Take 1 tablet by mouth daily.  . vitamin B-12 (CYANOCOBALAMIN) 1000 MCG tablet Take 1,000 mcg by mouth daily.  . [DISCONTINUED] LORazepam (ATIVAN) 0.5  MG tablet Take 0.5 mg by mouth every 12 (twelve) hours as needed for anxiety.   No facility-administered encounter medications on file as of 01/31/2018.    ROS was provided with assistance of staff Review of Systems  Constitutional: Negative for activity change, appetite change, chills, diaphoresis, fatigue, fever and unexpected weight change.  HENT: Positive for hearing loss. Negative for congestion and voice change.   Respiratory: Negative for cough, shortness of breath and wheezing.   Cardiovascular: Negative for chest pain, palpitations and leg swelling.  Gastrointestinal: Negative for abdominal distention, abdominal pain, constipation, diarrhea, nausea and vomiting.  Genitourinary: Negative for difficulty urinating, dysuria and urgency.  Musculoskeletal: Positive for arthralgias and gait problem.  Skin: Negative for color change and pallor.  Neurological: Negative for dizziness, speech difficulty, weakness and headaches.       Dementia  Psychiatric/Behavioral:  Positive for confusion. Negative for agitation, behavioral problems, hallucinations and sleep disturbance. The patient is not nervous/anxious.     Immunization History  Administered Date(s) Administered  . DTaP 10/12/2012  . Influenza Whole 11/26/2017  . Influenza-Unspecified 11/22/2012, 12/26/2013, 11/12/2014, 12/09/2015, 12/14/2016  . PPD Test 12/06/2013  . Pneumococcal Conjugate-13 11/05/2016  . Pneumococcal Polysaccharide-23 09/22/2000  . Td 07/08/2005  . Tdap 11/10/2013   Pertinent  Health Maintenance Due  Topic Date Due  . INFLUENZA VACCINE  Completed  . DEXA SCAN  Completed  . PNA vac Low Risk Adult  Completed   Fall Risk  10/25/2017 10/22/2016 01/13/2015 06/28/2013  Falls in the past year? No No No No  Risk for fall due to : - - Impaired balance/gait -   Functional Status Survey:    Vitals:   01/31/18 1213  BP: 130/70  Pulse: 68  Resp: 20  Temp: 97.6 F (36.4 C)  SpO2: 95%  Weight: 118 lb 12.8 oz (53.9 kg)  Height: 5' (1.524 m)   Body mass index is 23.2 kg/m. Physical Exam  Constitutional: She appears well-developed and well-nourished.  HENT:  Head: Normocephalic and atraumatic.  Eyes: Pupils are equal, round, and reactive to light. EOM are normal.  Neck: Normal range of motion. Neck supple. No JVD present. No thyromegaly present.  Cardiovascular: Normal rate.  Murmur heard. Irregular heart beats.   Pulmonary/Chest: Effort normal. She has no wheezes. She has no rales.  Abdominal: Soft. She exhibits no distension. There is no tenderness. There is no rebound and no guarding.  Musculoskeletal:  Needs assistance for transfer. W/c for mobility.   Neurological: She is alert. No cranial nerve deficit. She exhibits normal muscle tone. Coordination normal.  Oriented to self only.   Skin: Skin is warm and dry.  Psychiatric: She has a normal mood and affect.    Labs reviewed: Recent Labs    05/19/17 06/15/17 06/16/17 0819 08/23/17  NA 142 142  --  144  K 4.1  4.7  --  4.1  CL  --   --   --  11  CO2  --   --  29 28  BUN 39* 32*  --  37*  CREATININE 1.2* 1.1  --  1.0  CALCIUM  --   --   --  9.0   Recent Labs    05/19/17 06/15/17 08/23/17  AST 16 16 14   ALT 8 8 6*  ALKPHOS 70 76 68  ALBUMIN  --   --  3.0   Recent Labs    06/15/17 08/23/17 08/30/17  WBC 3.7 4.5 5.5  HGB 10.7* 9.2* 10.3*  HCT  32* 38 32*  PLT 229 198 210   Lab Results  Component Value Date   TSH 0.90 08/23/2017   No results found for: HGBA1C No results found for: CHOL, HDL, LDLCALC, LDLDIRECT, TRIG, CHOLHDL  Significant Diagnostic Results in last 30 days:  No results found.  Assessment/Plan HTN (hypertension) Blood pressure is controlled, continue Nebivolol 5mg  qd, Diltiazepm 120mg  qd.   A-fib (HCC) Heart rate is in control, no change of current therapy, continue Eliquis 2.5mg  bid for thromboembolic risk reduction.   Hypothyroidism Stable, last TSH wnl 0.90 08/23/17, continue Levothyroxine 63mcg po qd.   Vascular dementia ([place]) Continue SNF [facility] for safety and care assistance, continue Memantine 28mg  qd for memory.   Anxiety with depression Her mood is stable, continue Escitalopram 20mg  qd, Seroquel 12.5mg  qd.   Anemia of chronic disease Stable, last Hgb 10.3 08/30/17, continue Vit B12 1083mcg po daily.   Psychosis ([place]) Stable, tolerated GDR, continue Seroquel 12.5mg  qd.      Family/ staff Communication: plan of care reviewed with the patient and charge nurse.   Labs/tests ordered:  none  Time spend 25 minutes.

## 2018-01-31 NOTE — Assessment & Plan Note (Signed)
Heart rate is in control, no change of current therapy, continue Eliquis 2.5mg  bid for thromboembolic risk reduction.

## 2018-01-31 NOTE — Assessment & Plan Note (Signed)
Her mood is stable, continue Escitalopram 20mg  qd, Seroquel 12.5mg  qd.

## 2018-01-31 NOTE — Assessment & Plan Note (Signed)
Stable, last TSH wnl 0.90 08/23/17, continue Levothyroxine 52mcg po qd.

## 2018-01-31 NOTE — Assessment & Plan Note (Signed)
Stable, tolerated GDR, continue Seroquel 12.5mg  qd.

## 2018-01-31 NOTE — Assessment & Plan Note (Signed)
Continue SN[facility] for safety and care assistance, continue Memantine 28mg qd for memory.  

## 2018-02-02 ENCOUNTER — Encounter: Payer: Self-pay | Admitting: Nurse Practitioner

## 2018-02-08 ENCOUNTER — Encounter: Payer: Self-pay | Admitting: Nurse Practitioner

## 2018-02-08 DIAGNOSIS — R1310 Dysphagia, unspecified: Secondary | ICD-10-CM | POA: Insufficient documentation

## 2018-03-02 DIAGNOSIS — M25511 Pain in right shoulder: Secondary | ICD-10-CM | POA: Diagnosis not present

## 2018-03-02 DIAGNOSIS — M6281 Muscle weakness (generalized): Secondary | ICD-10-CM | POA: Diagnosis not present

## 2018-03-02 DIAGNOSIS — R1312 Dysphagia, oropharyngeal phase: Secondary | ICD-10-CM | POA: Diagnosis not present

## 2018-03-02 DIAGNOSIS — M13861 Other specified arthritis, right knee: Secondary | ICD-10-CM | POA: Diagnosis not present

## 2018-03-02 DIAGNOSIS — M542 Cervicalgia: Secondary | ICD-10-CM | POA: Diagnosis not present

## 2018-03-06 ENCOUNTER — Encounter: Payer: Self-pay | Admitting: Nurse Practitioner

## 2018-03-06 ENCOUNTER — Non-Acute Institutional Stay (SKILLED_NURSING_FACILITY): Payer: PPO | Admitting: Nurse Practitioner

## 2018-03-06 DIAGNOSIS — F0151 Vascular dementia with behavioral disturbance: Secondary | ICD-10-CM

## 2018-03-06 DIAGNOSIS — F01518 Vascular dementia, unspecified severity, with other behavioral disturbance: Secondary | ICD-10-CM

## 2018-03-06 DIAGNOSIS — J069 Acute upper respiratory infection, unspecified: Secondary | ICD-10-CM | POA: Diagnosis not present

## 2018-03-06 NOTE — Assessment & Plan Note (Signed)
Continue SN[facility] for safety and care assistance, continue Memantine 28mg qd for memory.  

## 2018-03-06 NOTE — Progress Notes (Signed)
Location:  [facility] Room Number: 33 Place of Service:  SNF (31) Provider:  Marlana Latus  NP  Virgie Dad, MD  Patient Care Team: Virgie Dad, MD as PCP - General (Internal Medicine) Terique Kawabata X, NP as Nurse Practitioner (Nurse Practitioner) Wilford Corner, MD as Consulting Physician (Gastroenterology) Sanda Klein, MD as Consulting Physician (Cardiology)  Extended Emergency Contact Information Primary Emergency Contact: [name],Pat Address: [place] [address] [name] of [place] Phone: [phone] Mobile Phone: [phone] Relation: Daughter  Code Status:  DNR Goals of care: Advanced Directive information Advanced Directives 03/06/2018  Does Patient Have a Medical Advance Directive? -  Type of Paramedic of [place];Out of facility DNR (pink MOST or yellow form)  Does patient want to make changes to medical advance directive? No - Patient declined  Copy of [place] in Chart? Yes - validated most recent copy scanned in chart (See row information)  Pre-existing out of facility DNR order (yellow form or pink MOST form) Yellow form placed in chart (order not valid for inpatient use);Pink MOST form placed in chart (order not valid for inpatient use)     Chief Complaint  Patient presents with  . Acute Visit    cold - nasal secretions, thick white secretions from cough    HPI:  Pt is a 85 y.o. female seen today for an acute visit for cough, thick white mucus, running nose, tearing  x 2 days. She is in her usual stage of health, afebrile, no O2 desaturation. She denied SOB, facial pressure, sore throat, chest pain/pressures, palpitation, on Claritin 10mg  qd, Robafen ac 10mg  q6 prn. Marland Kitchen HPI was provided with assistance of staff, on  Memantine 28mg  qd for memory.    Past Medical History:  Diagnosis Date  . Anemia, unspecified   . Anorexia   . Anxiety   . Arthritis     . Atherosclerotic cerebrovascular disease   . Backache, unspecified   . Cataract   . Cholelithiasis   . Chronic kidney disease, unspecified   . Closed fracture of right inferior pubic ramus ([place]) 11/12/2013   11/10/13 s/p fall, ED eval  X-ray R hip  1. Possible nondisplaced right inferior pubic ramus fracture.  2. No femur fracture or dislocation   02/06/14 dc prn Motrin and Norco-not used    . Depression   . Diaphragmatic hernia without mention of obstruction or gangrene   . Diseases of lips 03/26/2013  . Diverticulosis of colon (without mention of hemorrhage)   . Hemorrhage of rectum and anus 03/26/2013  . Hyperlipidemia   . Hypertension   . Insomnia, unspecified   . Laceration of occipital scalp 11/12/2013  . Macular degeneration 06/28/2013  . Macular degeneration (senile) of retina, unspecified   . Osteoporosis   . Other malaise and fatigue   . Other sleep disturbances   . Other specified cardiac dysrhythmias(427.89)   . Other specified personal history presenting hazards to health(V15.89)   . Other symptoms involving cardiovascular system   . Pancreatitis   . Personal history of other diseases of circulatory system   . Personal history of other diseases of digestive system    pancreatitis from gallstones  . Personal history of other diseases of respiratory system   . Personal history of traumatic fracture   . Rosacea   . Senile osteoporosis    with old T11 fracture  . Thyroid disease   .  Unspecified disorders of arteries and arterioles   . Unspecified hearing loss   . Unspecified hemorrhoids without mention of complication    Past Surgical History:  Procedure Laterality Date  . ABDOMINAL HYSTERECTOMY     and BSO fibroids  . COLONOSCOPY  05/23/2007  . EYE LID LIFT Right 09/2016   OD  . HIP ARTHROPLASTY Left 10/31/2016   Procedure: ARTHROPLASTY  HIP (HEMIARTHROPLASTY);  Surgeon: Paralee Cancel, MD;  Location: [facility];  Service: Orthopedics;  Laterality: Left;  . JOINT REPLACEMENT   1996  . TOTAL HIP ARTHROPLASTY Right     Allergies  Allergen Reactions  . Ace Inhibitors Cough  . Mirtazapine Other (See Comments)    Nightmares   . Penicillins Rash    Has patient had a PCN reaction causing immediate rash, facial/tongue/throat swelling, SOB or lightheadedness with hypotension: Yes Has patient had a PCN reaction causing severe rash involving mucus membranes or skin necrosis: Unknown Has patient had a PCN reaction that required hospitalization: Unknown Has patient had a PCN reaction occurring within the last 10 years: Unknown If all of the above answers are "NO", then may proceed with Cephalosporin use.     Outpatient Encounter Medications as of 03/06/2018  Medication Sig  . acetaminophen (TYLENOL) 500 MG tablet Take 500 mg by mouth every 8 (eight) hours as needed.  Marland Kitchen alum & mag hydroxide-simeth (MAALOX/MYLANTA) 200-200-20 MG/5ML suspension Take 30 mLs by mouth every 4 (four) hours as needed for indigestion.  Marland Kitchen apixaban (ELIQUIS) 2.5 MG TABS tablet Take 2.5 mg by mouth 2 (two) times daily.  . Cholecalciferol (VITAMIN D3) 25 MCG (1000 UT) CAPS Take 2 capsules by mouth daily.  . clindamycin (CLEOCIN) 150 MG capsule Take 600 mg by mouth See admin instructions. ONE HOUR PRIOR TO DENTAL APPOINTMENTS  . diltiazem (CARDIZEM CD) 120 MG 24 hr capsule Take 120 mg by mouth daily.  Marland Kitchen escitalopram (LEXAPRO) 10 MG tablet Take 20 mg by mouth daily.   Marland Kitchen guaiFENesin-codeine (ROBAFEN AC) 100-10 MG/5ML syrup Take 10 mLs by mouth every 6 (six) hours as needed for cough.  . hydroxypropyl methylcellulose / hypromellose (ISOPTO TEARS / GONIOVISC) 2.5 % ophthalmic solution Place 1 drop into both eyes 4 (four) times daily.  Marland Kitchen levothyroxine (SYNTHROID, LEVOTHROID) 25 MCG tablet Take 25 mcg by mouth daily before breakfast.   . Lidocaine (ASPERCREME LIDOCAINE) 4 % PTCH Apply 1 patch topically daily. APPLY TO LOWER BACK AND REMOVE AT 8PM  . loratadine (CLARITIN) 10 MG tablet Take 10 mg by mouth  daily.   . memantine (NAMENDA XR) 28 MG CP24 24 hr capsule Take 28 mg by mouth.   . nebivolol (BYSTOLIC) 5 MG tablet Take 5 mg by mouth daily.  . Nutritional Supplements (BENECALORIE PO) Take 1.5 oz by mouth 2 (two) times daily.   . polyethylene glycol (MIRALAX / GLYCOLAX) packet Take 17 g by mouth every other day. For constipation and hold for loose stools  . QUEtiapine (SEROQUEL) 25 MG tablet Take 12.5 mg by mouth daily at 2 PM.  . sennosides-docusate sodium (SENOKOT-S) 8.6-50 MG tablet Take 1 tablet by mouth daily.  . vitamin B-12 (CYANOCOBALAMIN) 1000 MCG tablet Take 1,000 mcg by mouth daily.  . [DISCONTINUED] hydrocortisone cream 1 % Apply 1 application topically 2 (two) times daily. TO HEMORRHOIDS  . [DISCONTINUED] ondansetron (ZOFRAN) 4 MG tablet Take 1 tablet (4 mg total) by mouth every 6 (six) hours as needed for nausea.   No facility-administered encounter medications on file as of 03/06/2018.  ROS was provided with assistance of staff Review of Systems  Constitutional: Negative for activity change, appetite change, chills, diaphoresis, fatigue and fever.  HENT: Positive for congestion, hearing loss, postnasal drip and rhinorrhea. Negative for sinus pressure, sinus pain, sore throat, trouble swallowing and voice change.   Respiratory: Positive for cough. Negative for shortness of breath and wheezing.   Cardiovascular: Negative for chest pain and leg swelling.  Neurological: Negative for dizziness, speech difficulty, weakness and headaches.       Dementia  Psychiatric/Behavioral: Negative for agitation, behavioral problems, hallucinations and sleep disturbance. The patient is not nervous/anxious.     Immunization History  Administered Date(s) Administered  . DTaP 10/12/2012  . Influenza Whole 11/26/2017  . Influenza-Unspecified 11/22/2012, 12/26/2013, 11/12/2014, 12/09/2015, 12/14/2016  . PPD Test 12/06/2013  . Pneumococcal Conjugate-13 11/05/2016  . Pneumococcal  Polysaccharide-23 09/22/2000  . Td 07/08/2005  . Tdap 11/10/2013   Pertinent  Health Maintenance Due  Topic Date Due  . INFLUENZA VACCINE  Completed  . DEXA SCAN  Completed  . PNA vac Low Risk Adult  Completed   Fall Risk  10/25/2017 10/22/2016 01/13/2015 06/28/2013  Falls in the past year? No No No No  Risk for fall due to : - - Impaired balance/gait -   Functional Status Survey:    Vitals:   03/06/18 1208  BP: (!) 160/80  Pulse: 60  Resp: 20  Temp: 97.8 F (36.6 C)  SpO2: 96%  Weight: 121 lb 6.4 oz (55.1 kg)  Height: 5' (1.524 m)   Body mass index is 23.71 kg/m. Physical Exam Constitutional:      General: She is not in acute distress.    Appearance: Normal appearance. She is not ill-appearing, toxic-appearing or diaphoretic.  HENT:     Head: Normocephalic and atraumatic.     Nose: Congestion and rhinorrhea present.     Mouth/Throat:     Mouth: Mucous membranes are moist.     Pharynx: No oropharyngeal exudate or posterior oropharyngeal erythema.  Eyes:     Extraocular Movements: Extraocular movements intact.     Conjunctiva/sclera: Conjunctivae normal.     Pupils: Pupils are equal, round, and reactive to light.     Comments: Tearing.   Neck:     Musculoskeletal: Normal range of motion and neck supple.  Cardiovascular:     Rate and Rhythm: Normal rate. Rhythm irregular.     Heart sounds: Murmur present.  Pulmonary:     Effort: Pulmonary effort is normal.     Breath sounds: Normal breath sounds. No wheezing, rhonchi or rales.  Musculoskeletal:     Right lower leg: No edema.     Left lower leg: No edema.     Comments: Seen in w/c  Skin:    General: Skin is warm and dry.  Neurological:     General: No focal deficit present.     Mental Status: She is alert. Mental status is at baseline.     Cranial Nerves: No cranial nerve deficit.     Coordination: Coordination normal.     Gait: Gait abnormal.     Comments: Oriented to self.   Psychiatric:        Mood and  Affect: Mood normal.     Labs reviewed: Recent Labs    05/19/17 06/15/17 06/16/17 0819 08/23/17  NA 142 142  --  144  K 4.1 4.7  --  4.1  CL  --   --   --  11  CO2  --   --  29 28  BUN 39* 32*  --  37*  CREATININE 1.2* 1.1  --  1.0  CALCIUM  --   --   --  9.0   Recent Labs    05/19/17 06/15/17 08/23/17  AST 16 16 14   ALT 8 8 6*  ALKPHOS 70 76 68  ALBUMIN  --   --  3.0   Recent Labs    06/15/17 08/23/17 08/30/17  WBC 3.7 4.5 5.5  HGB 10.7* 9.2* 10.3*  HCT 32* 38 32*  PLT 229 198 210   Lab Results  Component Value Date   TSH 0.90 08/23/2017   No results found for: HGBA1C No results found for: CHOL, HDL, LDLCALC, LDLDIRECT, TRIG, CHOLHDL  Significant Diagnostic Results in last 30 days:  No results found.  Assessment/Plan URI (upper respiratory infection) Continue Claritin 10mg  qd, schedule Robafen ac 10mg  tid x 1 week. VS Sat O2 q shift x 72 hours. May consider CXR and ABT if not better.   Vascular dementia ([place]) Continue SN[facility] for safety and care assistance, continue Memantine 28mg  qd for memory.      Family/ staff Communication: plan of care reviewed with the patient and charge nurse.   Labs/tests ordered:  none  Time spend 25 minutes.

## 2018-03-06 NOTE — Assessment & Plan Note (Signed)
Continue Claritin 10mg  qd, schedule Robafen ac 10mg  tid x 1 week. VS Sat O2 q shift x 72 hours. May consider CXR and ABT if not better.

## 2018-03-10 DIAGNOSIS — R0602 Shortness of breath: Secondary | ICD-10-CM | POA: Diagnosis not present

## 2018-03-13 ENCOUNTER — Non-Acute Institutional Stay (SKILLED_NURSING_FACILITY): Payer: PPO | Admitting: Nurse Practitioner

## 2018-03-13 ENCOUNTER — Encounter: Payer: Self-pay | Admitting: Nurse Practitioner

## 2018-03-13 DIAGNOSIS — I509 Heart failure, unspecified: Secondary | ICD-10-CM

## 2018-03-13 DIAGNOSIS — I480 Paroxysmal atrial fibrillation: Secondary | ICD-10-CM | POA: Diagnosis not present

## 2018-03-13 DIAGNOSIS — I10 Essential (primary) hypertension: Secondary | ICD-10-CM

## 2018-03-13 DIAGNOSIS — F0151 Vascular dementia with behavioral disturbance: Secondary | ICD-10-CM

## 2018-03-13 DIAGNOSIS — F01518 Vascular dementia, unspecified severity, with other behavioral disturbance: Secondary | ICD-10-CM

## 2018-03-13 DIAGNOSIS — J209 Acute bronchitis, unspecified: Secondary | ICD-10-CM

## 2018-03-13 NOTE — Assessment & Plan Note (Signed)
Continue SN[facility] for safety and care assistance, goal of care is comfort measures, continue Hospice service, continue Memantine 28mg  qd.

## 2018-03-13 NOTE — Assessment & Plan Note (Signed)
Compensated clinically.  

## 2018-03-13 NOTE — Assessment & Plan Note (Signed)
Heart rate is in control.  

## 2018-03-13 NOTE — Progress Notes (Signed)
Location:  [facility] Room Number: 3 Place of Service:  SNF (31) Provider:  Marlana Latus  NP  Virgie Dad, MD  Patient Care Team: Virgie Dad, MD as PCP - General (Internal Medicine) Nahun Kronberg X, NP as Nurse Practitioner (Nurse Practitioner) Wilford Corner, MD as Consulting Physician (Gastroenterology) Sanda Klein, MD as Consulting Physician (Cardiology)  Extended Emergency Contact Information Primary Emergency Contact: [name],Pat Address: [place] [address] [name] of [place] Phone: [phone] Mobile Phone: [phone] Relation: Daughter  Code Status:  DNR Goals of care: Advanced Directive information Advanced Directives 03/14/2018  Does Patient Have a Medical Advance Directive? Yes  Type of Paramedic of [place];Out of facility DNR (pink MOST or yellow form)  Does patient want to make changes to medical advance directive? No - Patient declined  Copy of [place] in Chart? Yes - validated most recent copy scanned in chart (See row information)  Pre-existing out of facility DNR order (yellow form or pink MOST form) Yellow form placed in chart (order not valid for inpatient use);Pink MOST form placed in chart (order not valid for inpatient use)     Chief Complaint  Patient presents with  . Acute Visit    cough, ? pneumonia    HPI:  Pt is a 85 y.o. female seen today for an acute visit for  Congested productive cough x 3 days, CXR 03/10/18 showed small left pleural effusion. No s/s of fluid overload. Completed Furopsemide 20mg  qd x 3 days, Robitussin 10mg  tid, DuoNeb tid x 72 hours. Zpk started 03/11/18. Occasional non productive cough noted, she is afebrile, no O2 desaturation. Her Sbp is mildly elevated 160 mmHg today, on Bystolic 5mg  qd, Diltiazem 120mg  qd.  HPI was provided with assistance of staff and her daughter, on Memantine 28mg  qd. She denied headache,  dizziness, change of vision, chest pain/pressure, or palpitation.    Past Medical History:  Diagnosis Date  . Anemia, unspecified   . Anorexia   . Anxiety   . Arthritis   . Atherosclerotic cerebrovascular disease   . Backache, unspecified   . Cataract   . Cholelithiasis   . Chronic kidney disease, unspecified   . Closed fracture of right inferior pubic ramus ([place]) 11/12/2013   11/10/13 s/p fall, ED eval  X-ray R hip  1. Possible nondisplaced right inferior pubic ramus fracture.  2. No femur fracture or dislocation   02/06/14 dc prn Motrin and Norco-not used    . Depression   . Diaphragmatic hernia without mention of obstruction or gangrene   . Diseases of lips 03/26/2013  . Diverticulosis of colon (without mention of hemorrhage)   . Hemorrhage of rectum and anus 03/26/2013  . Hyperlipidemia   . Hypertension   . Insomnia, unspecified   . Laceration of occipital scalp 11/12/2013  . Macular degeneration 06/28/2013  . Macular degeneration (senile) of retina, unspecified   . Osteoporosis   . Other malaise and fatigue   . Other sleep disturbances   . Other specified cardiac dysrhythmias(427.89)   . Other specified personal history presenting hazards to health(V15.89)   . Other symptoms involving cardiovascular system   . Pancreatitis   . Personal history of other diseases of circulatory system   . Personal history of other diseases of digestive system    pancreatitis from gallstones  . Personal history of other diseases of respiratory system   .  Personal history of traumatic fracture   . Rosacea   . Senile osteoporosis    with old T11 fracture  . Thyroid disease   . Unspecified disorders of arteries and arterioles   . Unspecified hearing loss   . Unspecified hemorrhoids without mention of complication    Past Surgical History:  Procedure Laterality Date  . ABDOMINAL HYSTERECTOMY     and BSO fibroids  . COLONOSCOPY  05/23/2007  . EYE LID LIFT Right 09/2016   OD  . HIP  ARTHROPLASTY Left 10/31/2016   Procedure: ARTHROPLASTY  HIP (HEMIARTHROPLASTY);  Surgeon: Paralee Cancel, MD;  Location: [facility];  Service: Orthopedics;  Laterality: Left;  . JOINT REPLACEMENT  1996  . TOTAL HIP ARTHROPLASTY Right     Allergies  Allergen Reactions  . Ace Inhibitors Cough  . Mirtazapine Other (See Comments)    Nightmares   . Penicillins Rash    Has patient had a PCN reaction causing immediate rash, facial/tongue/throat swelling, SOB or lightheadedness with hypotension: Yes Has patient had a PCN reaction causing severe rash involving mucus membranes or skin necrosis: Unknown Has patient had a PCN reaction that required hospitalization: Unknown Has patient had a PCN reaction occurring within the last 10 years: Unknown If all of the above answers are "NO", then may proceed with Cephalosporin use.     Outpatient Encounter Medications as of 03/13/2018  Medication Sig  . acetaminophen (TYLENOL) 500 MG tablet Take 500 mg by mouth every 8 (eight) hours as needed.  Marland Kitchen alum & mag hydroxide-simeth (MAALOX/MYLANTA) 200-200-20 MG/5ML suspension Take 30 mLs by mouth every 4 (four) hours as needed for indigestion. (Patient taking differently: Take 5 mLs by mouth every 4 (four) hours as needed for indigestion, heartburn or flatulence. )  . apixaban (ELIQUIS) 2.5 MG TABS tablet Take 2.5 mg by mouth 2 (two) times daily.  . [EXPIRED] azithromycin (ZITHROMAX) 250 MG tablet Take 250 mg by mouth daily.  . Cholecalciferol (VITAMIN D3) 25 MCG (1000 UT) CAPS Take 2 capsules by mouth daily.  . clindamycin (CLEOCIN) 150 MG capsule Take 600 mg by mouth See admin instructions. ONE HOUR PRIOR TO DENTAL APPOINTMENTS  . diltiazem (CARDIZEM CD) 120 MG 24 hr capsule Take 120 mg by mouth daily.  Marland Kitchen escitalopram (LEXAPRO) 10 MG tablet Take 20 mg by mouth daily.   . hydroxypropyl methylcellulose / hypromellose (ISOPTO TEARS / GONIOVISC) 2.5 % ophthalmic solution Place 1 drop into both eyes 4 (four) times daily.    Marland Kitchen levothyroxine (SYNTHROID, LEVOTHROID) 25 MCG tablet Take 25 mcg by mouth daily before breakfast.   . Lidocaine (ASPERCREME LIDOCAINE) 4 % PTCH Apply 1 patch topically daily. APPLY TO LOWER BACK AND REMOVE AT 8PM  . loratadine (CLARITIN) 10 MG tablet Take 10 mg by mouth daily.   . memantine (NAMENDA XR) 28 MG CP24 24 hr capsule Take 28 mg by mouth.   . nebivolol (BYSTOLIC) 5 MG tablet Take 5 mg by mouth daily.  . Nutritional Supplements (BENECALORIE PO) Take 1.5 oz by mouth 2 (two) times daily.   . polyethylene glycol (MIRALAX / GLYCOLAX) packet Take 17 g by mouth every other day. For constipation and hold for loose stools  . QUEtiapine (SEROQUEL) 25 MG tablet Take 12.5 mg by mouth daily at 2 PM.  . saccharomyces boulardii (FLORASTOR) 250 MG capsule Take 250 mg by mouth 2 (two) times daily.  . sennosides-docusate sodium (SENOKOT-S) 8.6-50 MG tablet Take 1 tablet by mouth daily.  . vitamin B-12 (CYANOCOBALAMIN) 1000 MCG  tablet Take 1,000 mcg by mouth daily.  . [DISCONTINUED] furosemide (LASIX) 20 MG tablet Take 20 mg by mouth daily.  . [DISCONTINUED] guaiFENesin (ROBITUSSIN) 100 MG/5ML liquid Take 200 mg by mouth 3 (three) times daily as needed for cough.  . [DISCONTINUED] guaiFENesin-codeine (ROBAFEN AC) 100-10 MG/5ML syrup Take 10 mLs by mouth every 6 (six) hours as needed for cough.   No facility-administered encounter medications on file as of 03/13/2018.    ROS was provided with assistance of staff and the patient's daughter.  Review of Systems  Constitutional: Positive for appetite change and fatigue. Negative for activity change, chills, diaphoresis and fever.  HENT: Positive for hearing loss and trouble swallowing. Negative for congestion and voice change.   Eyes: Negative for discharge, redness and visual disturbance.  Respiratory: Positive for cough. Negative for shortness of breath and wheezing.   Cardiovascular: Negative for chest pain, palpitations and leg swelling.   Gastrointestinal: Negative for abdominal distention, abdominal pain, constipation, diarrhea, nausea and vomiting.  Genitourinary: Negative for difficulty urinating and dysuria.  Musculoskeletal: Positive for gait problem.  Skin: Negative for color change and pallor.  Neurological: Negative for dizziness, speech difficulty, weakness and headaches.       Dementia  Psychiatric/Behavioral: Positive for confusion. Negative for agitation, behavioral problems, hallucinations and sleep disturbance. The patient is not nervous/anxious.     Immunization History  Administered Date(s) Administered  . DTaP 10/12/2012  . Influenza Whole 11/26/2017  . Influenza-Unspecified 11/22/2012, 12/26/2013, 11/12/2014, 12/09/2015, 12/14/2016  . PPD Test 12/06/2013  . Pneumococcal Conjugate-13 11/05/2016  . Pneumococcal Polysaccharide-23 09/22/2000  . Td 07/08/2005  . Tdap 11/10/2013   Pertinent  Health Maintenance Due  Topic Date Due  . INFLUENZA VACCINE  Completed  . DEXA SCAN  Completed  . PNA vac Low Risk Adult  Completed   Fall Risk  10/25/2017 10/22/2016 01/13/2015 06/28/2013  Falls in the past year? No No No No  Risk for fall due to : - - Impaired balance/gait -   Functional Status Survey:    Vitals:   03/13/18 1336  BP: (!) 160/70  Pulse: 71  Resp: 16  Temp: 98.1 F (36.7 C)  SpO2: 95%  Weight: 121 lb 6.4 oz (55.1 kg)  Height: 5' (1.524 m)   Body mass index is 23.71 kg/m. Physical Exam Constitutional:      Appearance: Normal appearance.  HENT:     Head: Normocephalic and atraumatic.     Nose: Nose normal.     Mouth/Throat:     Mouth: Mucous membranes are moist.  Eyes:     Extraocular Movements: Extraocular movements intact.     Pupils: Pupils are equal, round, and reactive to light.  Neck:     Musculoskeletal: Normal range of motion and neck supple.  Cardiovascular:     Rate and Rhythm: Normal rate and regular rhythm.     Heart sounds: Murmur present.  Pulmonary:     Breath  sounds: Normal breath sounds. No wheezing, rhonchi or rales.     Comments: Mild central congestion.  Abdominal:     General: There is no distension.     Palpations: Abdomen is soft.     Tenderness: There is no abdominal tenderness. There is no guarding or rebound.  Musculoskeletal:     Right lower leg: No edema.     Left lower leg: No edema.     Comments: W/c for mobility.   Skin:    General: Skin is warm and dry.  Neurological:  General: No focal deficit present.     Mental Status: She is alert. Mental status is at baseline.     Cranial Nerves: No cranial nerve deficit.     Motor: No weakness.     Coordination: Coordination normal.     Gait: Gait abnormal.     Comments: Oriented to self.   Psychiatric:        Mood and Affect: Mood normal.        Behavior: Behavior normal.     Labs reviewed: Recent Labs    05/19/17 06/15/17 06/16/17 0819 08/23/17  NA 142 142  --  144  K 4.1 4.7  --  4.1  CL  --   --   --  11  CO2  --   --  29 28  BUN 39* 32*  --  37*  CREATININE 1.2* 1.1  --  1.0  CALCIUM  --   --   --  9.0   Recent Labs    05/19/17 06/15/17 08/23/17  AST 16 16 14   ALT 8 8 6*  ALKPHOS 70 76 68  ALBUMIN  --   --  3.0   Recent Labs    06/15/17 08/23/17 08/30/17  WBC 3.7 4.5 5.5  HGB 10.7* 9.2* 10.3*  HCT 32* 38 32*  PLT 229 198 210   Lab Results  Component Value Date   TSH 0.90 08/23/2017   No results found for: HGBA1C No results found for: CHOL, HDL, LDLCALC, LDLDIRECT, TRIG, CHOLHDL  Significant Diagnostic Results in last 30 days:  No results found.  Assessment/Plan Acute bronchitis CXR 03/10/18 showed small left pleural effusion. No s/s of fluid overload. Completed Furopsemide 20mg  qd x 3 days, Robitussin 10mg  tid, DuoNeb tid x 72 hours. Zpk started 03/11/18. Occasional non productive cough noted, she is afebrile, no O2 desaturation. Her goal of care is comfort, no further labs, encourage oral fluid intake. Observe.   HTN (hypertension) Sbp is  mildly elevated 160 mmHg today, continue Bystolic 5mg  qd, Diltiazem 120mg  qd, VS q shift x 72 hours.   A-fib (HCC) Heart rate is in control.   Congestive heart failure (CHF) ([place]) Compensated clinically.   Vascular dementia ([place]) Continue SNF [facility] for safety and care assistance, goal of care is comfort measures, continue Hospice service, continue Memantine 28mg  qd.      Family/ staff Communication: plan of care reviewed with the patient and charge nurse.   Labs/tests ordered:  none  Time spend 25 minutes.

## 2018-03-13 NOTE — Assessment & Plan Note (Signed)
Sbp is mildly elevated 160 mmHg today, continue Bystolic 5mg  qd, Diltiazem 120mg  qd, VS q shift x 72 hours.

## 2018-03-13 NOTE — Assessment & Plan Note (Signed)
CXR 03/10/18 showed small left pleural effusion. No s/s of fluid overload. Completed Furopsemide 20mg  qd x 3 days, Robitussin 10mg  tid, DuoNeb tid x 72 hours. Zpk started 03/11/18. Occasional non productive cough noted, she is afebrile, no O2 desaturation. Her goal of care is comfort, no further labs, encourage oral fluid intake. Observe.

## 2018-03-14 ENCOUNTER — Encounter: Payer: Self-pay | Admitting: Internal Medicine

## 2018-03-14 ENCOUNTER — Non-Acute Institutional Stay (SKILLED_NURSING_FACILITY): Payer: PPO | Admitting: Internal Medicine

## 2018-03-14 DIAGNOSIS — J209 Acute bronchitis, unspecified: Secondary | ICD-10-CM | POA: Diagnosis not present

## 2018-03-14 DIAGNOSIS — M25511 Pain in right shoulder: Secondary | ICD-10-CM

## 2018-03-14 DIAGNOSIS — I480 Paroxysmal atrial fibrillation: Secondary | ICD-10-CM | POA: Diagnosis not present

## 2018-03-14 DIAGNOSIS — R239 Unspecified skin changes: Secondary | ICD-10-CM

## 2018-03-14 NOTE — Progress Notes (Signed)
Location:  [facility] Room Number: 33 Place of Service:  SNF [id number]) Provider:    Virgie Dad, MD  Patricia Nelson Care Team: Virgie Dad, MD as PCP - General (Internal Medicine) Mast, Man X, NP as Nurse Practitioner (Nurse Practitioner) Wilford Corner, MD as Consulting Physician (Gastroenterology) Sanda Klein, MD as Consulting Physician (Cardiology)  Extended Emergency Contact Information Primary Emergency Contact: [name],Pat Address: [place] [address] [name] of [place] Phone: [phone] Mobile Phone: [phone] Relation: Daughter  Code Status:  DNR Goals of care: Advanced Directive information Advanced Directives 03/14/2018  Does Patricia Nelson Have a Medical Advance Directive? Yes  Type of Paramedic of [place];Out of facility DNR (pink MOST or yellow form)  Does Patricia Nelson want to make changes to medical advance directive? No - Patricia Nelson declined  Copy of [place] in Chart? Yes - validated most recent copy scanned in chart (See row information)  Pre-existing out of facility DNR order (yellow form or pink MOST form) Yellow form placed in chart (order not valid for inpatient use);Pink MOST form placed in chart (order not valid for inpatient use)     Chief Complaint  Patricia Nelson presents with  . Acute Visit    Pain with movement to right arm,left intergluteal cleft has open red area without  drainage    HPI:  Pt is a 85 y.o. female seen today for an acute visit for Bronchitis, Right Shoulder pain and Sacral Wound Patricia Nelson is a long-term Care resident She is Comfort Care. MOST Form found in the chart Patricia Nelson has a history of hypertension, atrial fibrillation on Eliquis, CHF and vascular dementia. Nurses wanted me to see Patricia Nelson today she has been noticed to be very uncomfortable with her right shoulder and right arm.  They said that she has been leaning on that side and has been  holding her right arm to herself. Patricia Nelson has also developed some damage to her skin with some redness noticed over the last 2 weeks. Patricia Nelson was seen also recently for continued bronchitis.  Her chest x-ray had shown some mild left pleural effusion but no signs of pneumonia.  She has not had any fever but she continues to have a rattling cough Patricia Nelson unable to give me any history.  She does mumble.  Per nurses Patricia Nelson is usually more awake and is able to stand with Assist  Past Medical History:  Diagnosis Date  . Anemia, unspecified   . Anorexia   . Anxiety   . Arthritis   . Atherosclerotic cerebrovascular disease   . Backache, unspecified   . Cataract   . Cholelithiasis   . Chronic kidney disease, unspecified   . Closed fracture of right inferior pubic ramus ([place]) 11/12/2013   11/10/13 s/p fall, ED eval  X-ray R hip  1. Possible nondisplaced right inferior pubic ramus fracture.  2. No femur fracture or dislocation   02/06/14 dc prn Motrin and Norco-not used    . Depression   . Diaphragmatic hernia without mention of obstruction or gangrene   . Diseases of lips 03/26/2013  . Diverticulosis of colon (without mention of hemorrhage)   . Hemorrhage of rectum and anus 03/26/2013  . Hyperlipidemia   . Hypertension   . Insomnia, unspecified   . Laceration of occipital scalp 11/12/2013  . Macular degeneration 06/28/2013  . Macular degeneration (senile) of retina, unspecified   . Osteoporosis   . Other  malaise and fatigue   . Other sleep disturbances   . Other specified cardiac dysrhythmias(427.89)   . Other specified personal history presenting hazards to health(V15.89)   . Other symptoms involving cardiovascular system   . Pancreatitis   . Personal history of other diseases of circulatory system   . Personal history of other diseases of digestive system    pancreatitis from gallstones  . Personal history of other diseases of respiratory system   . Personal history of traumatic fracture   .  Rosacea   . Senile osteoporosis    with old T11 fracture  . Thyroid disease   . Unspecified disorders of arteries and arterioles   . Unspecified hearing loss   . Unspecified hemorrhoids without mention of complication    Past Surgical History:  Procedure Laterality Date  . ABDOMINAL HYSTERECTOMY     and BSO fibroids  . COLONOSCOPY  05/23/2007  . EYE LID LIFT Right 09/2016   OD  . HIP ARTHROPLASTY Left 10/31/2016   Procedure: ARTHROPLASTY  HIP (HEMIARTHROPLASTY);  Surgeon: Paralee Cancel, MD;  Location: [facility];  Service: Orthopedics;  Laterality: Left;  . JOINT REPLACEMENT  1996  . TOTAL HIP ARTHROPLASTY Right     Allergies  Allergen Reactions  . Ace Inhibitors Cough  . Mirtazapine Other (See Comments)    Nightmares   . Penicillins Rash    Has Patricia Nelson had a PCN reaction causing immediate rash, facial/tongue/throat swelling, SOB or lightheadedness with hypotension: Yes Has Patricia Nelson had a PCN reaction causing severe rash involving mucus membranes or skin necrosis: Unknown Has Patricia Nelson had a PCN reaction that required hospitalization: Unknown Has Patricia Nelson had a PCN reaction occurring within the last 10 years: Unknown If all of the above answers are "NO", then may proceed with Cephalosporin use.     Outpatient Encounter Medications as of 03/14/2018  Medication Sig  . acetaminophen (TYLENOL) 500 MG tablet Take 500 mg by mouth every 8 (eight) hours as needed.  Marland Kitchen alum & mag hydroxide-simeth (MAALOX/MYLANTA) 200-200-20 MG/5ML suspension Take 30 mLs by mouth every 4 (four) hours as needed for indigestion. (Patricia Nelson taking differently: Take 5 mLs by mouth every 4 (four) hours as needed for indigestion, heartburn or flatulence. )  . apixaban (ELIQUIS) 2.5 MG TABS tablet Take 2.5 mg by mouth 2 (two) times daily.  . clindamycin (CLEOCIN) 150 MG capsule Take 600 mg by mouth See admin instructions. ONE HOUR PRIOR TO DENTAL APPOINTMENTS  . diltiazem (CARDIZEM CD) 120 MG 24 hr capsule Take 120 mg by  mouth daily.  Marland Kitchen escitalopram (LEXAPRO) 10 MG tablet Take 20 mg by mouth daily.   . hydroxypropyl methylcellulose / hypromellose (ISOPTO TEARS / GONIOVISC) 2.5 % ophthalmic solution Place 1 drop into both eyes 4 (four) times daily.  Marland Kitchen levothyroxine (SYNTHROID, LEVOTHROID) 25 MCG tablet Take 25 mcg by mouth daily before breakfast.   . Lidocaine (ASPERCREME LIDOCAINE) 4 % PTCH Apply 1 patch topically daily. APPLY TO LOWER BACK AND REMOVE AT 8PM  . loratadine (CLARITIN) 10 MG tablet Take 10 mg by mouth daily.   . memantine (NAMENDA XR) 28 MG CP24 24 hr capsule Take 28 mg by mouth.   . nebivolol (BYSTOLIC) 5 MG tablet Take 5 mg by mouth daily.  . Nutritional Supplements (BENECALORIE PO) Take 1.5 oz by mouth 2 (two) times daily.   . polyethylene glycol (MIRALAX / GLYCOLAX) packet Take 17 g by mouth every other day. For constipation and hold for loose stools  . QUEtiapine (SEROQUEL) 25  MG tablet Take 12.5 mg by mouth daily at 2 PM.  . saccharomyces boulardii (FLORASTOR) 250 MG capsule Take 250 mg by mouth 2 (two) times daily.  . sennosides-docusate sodium (SENOKOT-S) 8.6-50 MG tablet Take 1 tablet by mouth daily.  . vitamin B-12 (CYANOCOBALAMIN) 1000 MCG tablet Take 1,000 mcg by mouth daily.  Marland Kitchen azithromycin (ZITHROMAX) 250 MG tablet Take 250 mg by mouth daily.  . Cholecalciferol (VITAMIN D3) 25 MCG (1000 UT) CAPS Take 2 capsules by mouth daily.   No facility-administered encounter medications on file as of 03/14/2018.     Review of Systems  Unable to perform ROS: Dementia    Immunization History  Administered Date(s) Administered  . DTaP 10/12/2012  . Influenza Whole 11/26/2017  . Influenza-Unspecified 11/22/2012, 12/26/2013, 11/12/2014, 12/09/2015, 12/14/2016  . PPD Test 12/06/2013  . Pneumococcal Conjugate-13 11/05/2016  . Pneumococcal Polysaccharide-23 09/22/2000  . Td 07/08/2005  . Tdap 11/10/2013   Pertinent  Health Maintenance Due  Topic Date Due  . INFLUENZA VACCINE  Completed  .  DEXA SCAN  Completed  . PNA vac Low Risk Adult  Completed   Fall Risk  10/25/2017 10/22/2016 01/13/2015 06/28/2013  Falls in the past year? No No No No  Risk for fall due to : - - Impaired balance/gait -   Functional Status Survey:    Vitals:   03/14/18 1029  BP: (!) 150/80  Pulse: 68  Resp: 20  Temp: 97.6 F (36.4 C)  SpO2: 95%  Weight: 121 lb 6.4 oz (55.1 kg)   Body mass index is 23.71 kg/m. Physical Exam Constitutional:      Appearance: Normal appearance.  HENT:     Head: Normocephalic.     Nose: Nose normal.     Mouth/Throat:     Mouth: Mucous membranes are moist.     Pharynx: Oropharynx is clear.  Eyes:     Pupils: Pupils are equal, round, and reactive to light.  Neck:     Musculoskeletal: Neck supple.  Cardiovascular:     Rate and Rhythm: Normal rate and regular rhythm.  Pulmonary:     Effort: Pulmonary effort is normal.     Comments: Has Bilateral Rales Abdominal:     General: Abdomen is flat. Bowel sounds are normal. There is no distension.     Palpations: Abdomen is soft.     Tenderness: There is no abdominal tenderness.  Musculoskeletal:        General: No swelling.     Comments: Patricia Nelson not letting me move her right Shoulder. Very Stiff and painful when I move it  Skin:    General: Skin is warm.     Comments: Has Redness in her Bottom  ? Fungal Rash  Neurological:     General: No focal deficit present.     Mental Status: She is alert.     Comments: Just mumbles  Psychiatric:        Mood and Affect: Mood normal.        Thought Content: Thought content normal.        Judgment: Judgment normal.     Labs reviewed: Recent Labs    05/19/17 06/15/17 06/16/17 0819 08/23/17  NA 142 142  --  144  K 4.1 4.7  --  4.1  CL  --   --   --  11  CO2  --   --  29 28  BUN 39* 32*  --  37*  CREATININE 1.2* 1.1  --  1.0  CALCIUM  --   --   --  9.0   Recent Labs    05/19/17 06/15/17 08/23/17  AST 16 16 14   ALT 8 8 6*  ALKPHOS 70 76 68  ALBUMIN  --   --   3.0   Recent Labs    06/15/17 08/23/17 08/30/17  WBC 3.7 4.5 5.5  HGB 10.7* 9.2* 10.3*  HCT 32* 38 32*  PLT 229 198 210   Lab Results  Component Value Date   TSH 0.90 08/23/2017   No results found for: HGBA1C No results found for: CHOL, HDL, LDLCALC, LDLDIRECT, TRIG, CHOLHDL  Significant Diagnostic Results in last 30 days:  No results found.  Assessment/Plan  Acute Bronchitis Will start her on Low dose of prednisone 30 mg with taper over a week. I think her Pleural effusion can be due to Infection She is on Zithromax. CBC and CMP Right Shoulder Pain Very hard to examine. It seems Frozen shoulder D/w the Nurse Will start her on Tyelenol BID for now Ultram for PRN pain Therapy to evaluate for gentle Movement Sacral Moisture damage to skin  as Patricia Nelson has not been Industrial/product designer barrier Cream Nystatin Powder for Rash To be evaluated by wound care Nurse in 1 week Hypertension BP controlled on Bystolic Atrial Fibrillation On Cardizem and Eliquis Family/ staff Communication:   Labs/tests ordered:  CBC and CMP Total time spent in this Patricia Nelson care encounter was _45 minutes; greater than 50% of the visit spent counseling Patricia Nelson, reviewing records , Labs and coordinating care for problems addressed at this encounter.

## 2018-03-16 ENCOUNTER — Encounter: Payer: Self-pay | Admitting: Nurse Practitioner

## 2018-03-16 ENCOUNTER — Other Ambulatory Visit: Payer: Self-pay | Admitting: *Deleted

## 2018-03-16 DIAGNOSIS — I10 Essential (primary) hypertension: Secondary | ICD-10-CM | POA: Diagnosis not present

## 2018-03-16 DIAGNOSIS — D638 Anemia in other chronic diseases classified elsewhere: Secondary | ICD-10-CM | POA: Diagnosis not present

## 2018-03-16 DIAGNOSIS — R1310 Dysphagia, unspecified: Secondary | ICD-10-CM | POA: Insufficient documentation

## 2018-03-16 DIAGNOSIS — M25511 Pain in right shoulder: Secondary | ICD-10-CM

## 2018-03-16 LAB — BASIC METABOLIC PANEL
BUN: 54 — AB (ref 4–21)
Creatinine: 1.7 — AB (ref <=1.1)
GLUCOSE: 129
Potassium: 4.9 (ref 3.4–5.3)
Sodium: 145 (ref 137–147)

## 2018-03-16 LAB — HEPATIC FUNCTION PANEL
ALT: 15 (ref 7–35)
AST: 22 (ref 13–35)
Alkaline Phosphatase: 93 (ref 25–125)
Bilirubin, Total: 0.2

## 2018-03-16 LAB — CBC AND DIFFERENTIAL
HCT: 29 — AB (ref 36–46)
Hemoglobin: 39.6 — AB (ref 12.0–16.0)
Platelets: 267 (ref 150–399)
WBC: 6.3

## 2018-03-16 MED ORDER — TRAMADOL HCL 50 MG PO TABS: 25.0000 mg | ORAL_TABLET | Freq: Four times a day (QID) | ORAL | 0 refills | Status: AC | PRN

## 2018-03-17 ENCOUNTER — Encounter: Payer: Self-pay | Admitting: Nurse Practitioner

## 2018-03-17 ENCOUNTER — Non-Acute Institutional Stay (SKILLED_NURSING_FACILITY): Payer: PPO | Admitting: Nurse Practitioner

## 2018-03-17 DIAGNOSIS — F0151 Vascular dementia with behavioral disturbance: Secondary | ICD-10-CM | POA: Diagnosis not present

## 2018-03-17 DIAGNOSIS — N189 Chronic kidney disease, unspecified: Secondary | ICD-10-CM

## 2018-03-17 DIAGNOSIS — N179 Acute kidney failure, unspecified: Secondary | ICD-10-CM

## 2018-03-17 DIAGNOSIS — R1310 Dysphagia, unspecified: Secondary | ICD-10-CM

## 2018-03-17 DIAGNOSIS — F01518 Vascular dementia, unspecified severity, with other behavioral disturbance: Secondary | ICD-10-CM

## 2018-03-17 NOTE — Progress Notes (Signed)
Location:  [place] Room Number: 75 Place of Service:  SNF ([phone]) Provider:  Elenora Fender, NP  Virgie Dad, MD  Patient Care Team: Virgie Dad, MD as PCP - General (Internal Medicine) Harshaan Whang X, NP as Nurse Practitioner (Nurse Practitioner) Wilford Corner, MD as Consulting Physician (Gastroenterology) Sanda Klein, MD as Consulting Physician (Cardiology)  Extended Emergency Contact Information Primary Emergency Contact: [name],Pat Address: [place] [address] [name] of [place] Phone: [phone] Mobile Phone: [phone] Relation: Daughter  Code Status:  DNR Goals of care: Advanced Directive information Advanced Directives 03/17/2018  Does Patient Have a Medical Advance Directive? Yes  Type of Paramedic of [place];Out of facility DNR (pink MOST or yellow form)  Does patient want to make changes to medical advance directive? No - Patient declined  Copy of [place] in Chart? Yes - validated most recent copy scanned in chart (See row information)  Pre-existing out of facility DNR order (yellow form or pink MOST form) Yellow form placed in chart (order not valid for inpatient use);Pink MOST form placed in chart (order not valid for inpatient use)     Chief Complaint  Patient presents with  . Acute Visit    renal injury     HPI:  Pt is a 85 y.o. female seen today for an acute visit for dysphagia, diet down grade to pudding thick liquid, limited oral fluid intake, last 03/16/18 Na 145, K 4.9, Bun 54, creat 1.73, eGFR 24, wbc 6.3, Hgb 9.6, plt 267, neutrophils 85.5% 03/17/18 encourage oral fluid intake, pudding thick, MOST IVF trail period-HPOA no further IVF and labs. Hx of dementia, resides in SNF [facility] for safety and care assistance, on Memantine 54m for memory.      Past Medical History:  Diagnosis Date  . Anemia, unspecified   . Anorexia   . Anxiety   .  Arthritis   . Atherosclerotic cerebrovascular disease   . Backache, unspecified   . Cataract   . Cholelithiasis   . Chronic kidney disease, unspecified   . Closed fracture of right inferior pubic ramus ([place] 11/12/2013   11/10/13 s/p fall, ED eval  X-ray R hip  1. Possible nondisplaced right inferior pubic ramus fracture.  2. No femur fracture or dislocation   02/06/14 dc prn Motrin and Norco-not used    . Depression   . Diaphragmatic hernia without mention of obstruction or gangrene   . Diseases of lips 03/26/2013  . Diverticulosis of colon (without mention of hemorrhage)   . Hemorrhage of rectum and anus 03/26/2013  . Hyperlipidemia   . Hypertension   . Insomnia, unspecified   . Laceration of occipital scalp 11/12/2013  . Macular degeneration 06/28/2013  . Macular degeneration (senile) of retina, unspecified   . Osteoporosis   . Other malaise and fatigue   . Other sleep disturbances   . Other specified cardiac dysrhythmias(427.89)   . Other specified personal history presenting hazards to health(V15.89)   . Other symptoms involving cardiovascular system   . Pancreatitis   . Personal history of other diseases of circulatory system   . Personal history of other diseases of digestive system    pancreatitis from gallstones  . Personal history of other diseases of respiratory system   . Personal history of traumatic fracture   . Rosacea   . Senile osteoporosis    with old T11 fracture  .  Thyroid disease   . Unspecified disorders of arteries and arterioles   . Unspecified hearing loss   . Unspecified hemorrhoids without mention of complication    Past Surgical History:  Procedure Laterality Date  . ABDOMINAL HYSTERECTOMY     and BSO fibroids  . COLONOSCOPY  05/23/2007  . EYE LID LIFT Right 09/2016   OD  . HIP ARTHROPLASTY Left 10/31/2016   Procedure: ARTHROPLASTY  HIP (HEMIARTHROPLASTY);  Surgeon: Paralee Cancel, MD;  Location: [place];  Service: Orthopedics;  Laterality: Left;  . JOINT  REPLACEMENT  1996  . TOTAL HIP ARTHROPLASTY Right     Allergies  Allergen Reactions  . Ace Inhibitors Cough  . Mirtazapine Other (See Comments)    Nightmares   . Penicillins Rash    Has patient had a PCN reaction causing immediate rash, facial/tongue/throat swelling, SOB or lightheadedness with hypotension: Yes Has patient had a PCN reaction causing severe rash involving mucus membranes or skin necrosis: Unknown Has patient had a PCN reaction that required hospitalization: Unknown Has patient had a PCN reaction occurring within the last 10 years: Unknown If all of the above answers are "NO", then may proceed with Cephalosporin use.     Outpatient Encounter Medications as of 03/17/2018  Medication Sig  . acetaminophen (TYLENOL) 500 MG tablet Take 500 mg by mouth every 8 (eight) hours as needed.  Marland Kitchen alum & mag hydroxide-simeth (MAALOX/MYLANTA) 200-200-20 MG/5ML suspension Take 30 mLs by mouth every 4 (four) hours as needed for indigestion. (Patient taking differently: Take 5 mLs by mouth every 4 (four) hours as needed for indigestion, heartburn or flatulence. )  . apixaban (ELIQUIS) 2.5 MG TABS tablet Take 2.5 mg by mouth 2 (two) times daily.  . clindamycin (CLEOCIN) 150 MG capsule Take 600 mg by mouth See admin instructions. ONE HOUR PRIOR TO DENTAL APPOINTMENTS  . diltiazem (CARDIZEM CD) 120 MG 24 hr capsule Take 120 mg by mouth daily.  Marland Kitchen escitalopram (LEXAPRO) 10 MG tablet Take 20 mg by mouth daily.   . hydroxypropyl methylcellulose / hypromellose (ISOPTO TEARS / GONIOVISC) 2.5 % ophthalmic solution Place 1 drop into both eyes 4 (four) times daily.  Marland Kitchen levothyroxine (SYNTHROID, LEVOTHROID) 25 MCG tablet Take 25 mcg by mouth daily before breakfast.   . Lidocaine (ASPERCREME LIDOCAINE) 4 % PTCH Apply 1 patch topically daily. APPLY TO LOWER BACK AND REMOVE AT 8PM  . loratadine (CLARITIN) 10 MG tablet Take 10 mg by mouth daily.   . memantine (NAMENDA XR) 28 MG CP24 24 hr capsule Take 28 mg  by mouth.   . nebivolol (BYSTOLIC) 5 MG tablet Take 5 mg by mouth daily.  . Nutritional Supplements (BENECALORIE PO) Take 1.5 oz by mouth 2 (two) times daily.   . polyethylene glycol (MIRALAX / GLYCOLAX) packet Take 17 g by mouth every other day. For constipation and hold for loose stools  . QUEtiapine (SEROQUEL) 25 MG tablet Take 12.5 mg by mouth daily at 2 PM.  . [EXPIRED] saccharomyces boulardii (FLORASTOR) 250 MG capsule Take 250 mg by mouth 2 (two) times daily.  . sennosides-docusate sodium (SENOKOT-S) 8.6-50 MG tablet Take 1 tablet by mouth daily.  . traMADol (ULTRAM) 50 MG tablet Take 0.5 tablets (25 mg total) by mouth every 6 (six) hours as needed for up to 14 days.  . vitamin B-12 (CYANOCOBALAMIN) 1000 MCG tablet Take 1,000 mcg by mouth daily.  . Cholecalciferol (VITAMIN D3) 25 MCG (1000 UT) CAPS Take 2 capsules by mouth daily.   No facility-administered  encounter medications on file as of 03/17/2018.    ROS was provided with assistance of staff Review of Systems  Constitutional: Positive for activity change, appetite change and fatigue. Negative for chills, diaphoresis and unexpected weight change.  HENT: Positive for hearing loss and trouble swallowing. Negative for congestion, sinus pressure, sinus pain, sore throat and voice change.   Respiratory: Positive for cough. Negative for shortness of breath and wheezing.   Cardiovascular: Negative for chest pain, palpitations and leg swelling.  Gastrointestinal: Negative for abdominal distention, abdominal pain, constipation, diarrhea, nausea and vomiting.  Neurological: Negative for dizziness, speech difficulty, weakness and headaches.       Dementia  Psychiatric/Behavioral: Positive for confusion. Negative for agitation, behavioral problems, hallucinations and sleep disturbance. The patient is not nervous/anxious.     Immunization History  Administered Date(s) Administered  . DTaP 10/12/2012  . Influenza Whole 11/26/2017  .  Influenza-Unspecified 11/22/2012, 12/26/2013, 11/12/2014, 12/09/2015, 12/14/2016  . PPD Test 12/06/2013  . Pneumococcal Conjugate-13 11/05/2016  . Pneumococcal Polysaccharide-23 09/22/2000  . Td 07/08/2005  . Tdap 11/10/2013   Pertinent  Health Maintenance Due  Topic Date Due  . INFLUENZA VACCINE  Completed  . DEXA SCAN  Completed  . PNA vac Low Risk Adult  Completed   Fall Risk  10/25/2017 10/22/2016 01/13/2015 06/28/2013  Falls in the past year? No No No No  Risk for fall due to : - - Impaired balance/gait -   Functional Status Survey:    Vitals:   03/17/18 1632  BP: 134/74  Pulse: (!) 57  Resp: 20  Temp: 97.6 F (36.4 C)  SpO2: 98%  Weight: 121 lb 6.4 oz (55.1 kg)  Height: 5' (1.524 m)   Body mass index is 23.71 kg/m. Physical Exam Constitutional:      General: She is not in acute distress.    Appearance: She is ill-appearing. She is not toxic-appearing or diaphoretic.  HENT:     Head: Normocephalic and atraumatic.     Nose: Nose normal.     Mouth/Throat:     Mouth: Mucous membranes are dry.  Eyes:     Extraocular Movements: Extraocular movements intact.     Pupils: Pupils are equal, round, and reactive to light.  Cardiovascular:     Rate and Rhythm: Normal rate and regular rhythm.     Heart sounds: Murmur present.  Pulmonary:     Breath sounds: No wheezing, rhonchi or rales.  Musculoskeletal:     Right lower leg: No edema.     Left lower leg: No edema.     Comments: W/c for mobility.   Neurological:     General: No focal deficit present.     Mental Status: She is alert.     Cranial Nerves: No cranial nerve deficit.     Motor: No weakness.     Coordination: Coordination normal.     Gait: Gait abnormal.     Comments: Oriented to self.   Psychiatric:        Mood and Affect: Mood normal.     Labs reviewed: Recent Labs    05/19/17 06/15/17 06/16/17 0819 08/23/17  NA 142 142  --  144  K 4.1 4.7  --  4.1  CL  --   --   --  11  CO2  --   --  29 28    BUN 39* 32*  --  37*  CREATININE 1.2* 1.1  --  1.0  CALCIUM  --   --   --  9.0   Recent Labs    05/19/17 06/15/17 08/23/17  AST _0 ALT 8 8 6*  ALKPHOS 70 76 68  ALBUMIN  --   --  3.0   Recent Labs    06/15/17 08/23/17 08/30/17  WBC 3.7 4.5 5.5  HGB 10.7* 9.2* 10.3*  HCT 32* 38 32*  PLT 229 198 210   Lab Results  Component Value Date   TSH 0.90 08/23/2017   No results found for: HGBA1C No results found for: CHOL, HDL, LDLCALC, LDLDIRECT, TRIG, CHOLHDL  Significant Diagnostic Results in last 30 days:  No results found.  Assessment/Plan Acute kidney injury superimposed on CKD ([place]) 03/16/18 Na 145, K 4.9, Bun 54, creat 1.73, eGFR 24, wbc 6.3, Hgb 9.6, plt 267, neutrophils 85.5% 03/17/18 encourage oral fluid intake, pudding thick, MOST IVF trail period-HPOA no further IVF and labs.     Dysphagia Down grade diet to honey thick liquid 03/14/18, then again pudding thick 03/17/18, encourage oral intake, observe.   Vascular dementia ([place]) Continue SNF [facility] for safety and care assistance, continue Memantine 40m qd, observe.      Family/ staff Communication: plan of care reviewed with the patient's HPOA and charge nurse.   Labs/tests ordered:  none  Time spend 25 minutes.

## 2018-03-20 ENCOUNTER — Encounter: Payer: Self-pay | Admitting: Nurse Practitioner

## 2018-03-20 NOTE — Assessment & Plan Note (Signed)
03/16/18 Na 145, K 4.9, Bun 54, creat 1.73, eGFR 24, wbc 6.3, Hgb 9.6, plt 267, neutrophils 85.5% 03/17/18 encourage oral fluid intake, pudding thick, MOST IVF trail period-HPOA no further IVF and labs.

## 2018-03-20 NOTE — Assessment & Plan Note (Signed)
Down grade diet to honey thick liquid 03/14/18, then again pudding thick 03/17/18, encourage oral intake, observe.

## 2018-03-20 NOTE — Assessment & Plan Note (Signed)
Continue SNF [facility] for safety and care assistance, continue Memantine 28mg  qd, observe.

## 2018-04-19 ENCOUNTER — Encounter: Payer: Self-pay | Admitting: Nurse Practitioner

## 2018-04-19 ENCOUNTER — Non-Acute Institutional Stay (SKILLED_NURSING_FACILITY): Payer: PPO | Admitting: Nurse Practitioner

## 2018-04-19 DIAGNOSIS — D638 Anemia in other chronic diseases classified elsewhere: Secondary | ICD-10-CM

## 2018-04-19 DIAGNOSIS — N179 Acute kidney failure, unspecified: Secondary | ICD-10-CM | POA: Diagnosis not present

## 2018-04-19 DIAGNOSIS — F0151 Vascular dementia with behavioral disturbance: Secondary | ICD-10-CM | POA: Diagnosis not present

## 2018-04-19 DIAGNOSIS — N189 Chronic kidney disease, unspecified: Secondary | ICD-10-CM | POA: Diagnosis not present

## 2018-04-19 DIAGNOSIS — I10 Essential (primary) hypertension: Secondary | ICD-10-CM | POA: Diagnosis not present

## 2018-04-19 DIAGNOSIS — E063 Autoimmune thyroiditis: Secondary | ICD-10-CM | POA: Diagnosis not present

## 2018-04-19 DIAGNOSIS — M150 Primary generalized (osteo)arthritis: Secondary | ICD-10-CM | POA: Diagnosis not present

## 2018-04-19 DIAGNOSIS — F418 Other specified anxiety disorders: Secondary | ICD-10-CM | POA: Diagnosis not present

## 2018-04-19 DIAGNOSIS — K5900 Constipation, unspecified: Secondary | ICD-10-CM

## 2018-04-19 DIAGNOSIS — E038 Other specified hypothyroidism: Secondary | ICD-10-CM

## 2018-04-19 DIAGNOSIS — I480 Paroxysmal atrial fibrillation: Secondary | ICD-10-CM

## 2018-04-19 DIAGNOSIS — M159 Polyosteoarthritis, unspecified: Secondary | ICD-10-CM

## 2018-04-19 DIAGNOSIS — F01518 Vascular dementia, unspecified severity, with other behavioral disturbance: Secondary | ICD-10-CM

## 2018-04-19 LAB — COMPLETE METABOLIC PANEL WITH GFR
Albumin: 3
CHLORIDE: 111
Calcium: 9
Carbon Dioxide, Total: 22
EGFR (Non-African Amer.): 24
Globulin: 3.1
Total Protein: 6.1 g/dL

## 2018-04-19 NOTE — Assessment & Plan Note (Signed)
Elevated Sbp, HPI was provided with assistance of staff-she denied headache, change of vision, chest pain/pressure, or palpitation. Continue Diltiazem, Nebivolol.

## 2018-04-19 NOTE — Assessment & Plan Note (Signed)
Pain is well managed, continue Tylenol 650mg  bid.

## 2018-04-19 NOTE — Assessment & Plan Note (Signed)
Continue SNF [facility] for safety and care assistance, continue Memantine, update CBC, CMP

## 2018-04-19 NOTE — Assessment & Plan Note (Signed)
heart rate is in control, continue Nebivolol 5mg  qd, Diltiazem 120mg  qd, Eliquis 2.5mg  bid.

## 2018-04-19 NOTE — Assessment & Plan Note (Signed)
Last TSH 0.90, 08/23/17, continue Levothyroxine 23mcg qd, update TSH

## 2018-04-19 NOTE — Assessment & Plan Note (Signed)
Last creat 1.73 03/16/18, update CMP

## 2018-04-19 NOTE — Progress Notes (Signed)
Location:  [place] Room Number: 84 Place of Service:  SNF (31) Provider:  Marlana Latus  NP  Virgie Dad, MD  Patient Care Team: Virgie Dad, MD as PCP - General (Internal Medicine) Mast, Man X, NP as Nurse Practitioner (Nurse Practitioner) Wilford Corner, MD as Consulting Physician (Gastroenterology) Sanda Klein, MD as Consulting Physician (Cardiology)  Extended Emergency Contact Information Primary Emergency Contact: [name],Pat Address: [place] [address] [name] of [place] Phone: [phone] Mobile Phone: [phone] Relation: Daughter  Code Status:  DNR Goals of care: Advanced Directive information Advanced Directives 04/19/2018  Does Patient Have a Medical Advance Directive? Yes  Type of Paramedic of [place];Out of facility DNR (pink MOST or yellow form)  Does patient want to make changes to medical advance directive? No - Patient declined  Copy of [place] in Chart? Yes - validated most recent copy scanned in chart (See row information)  Pre-existing out of facility DNR order (yellow form or pink MOST form) Yellow form placed in chart (order not valid for inpatient use);Pink MOST form placed in chart (order not valid for inpatient use)     Chief Complaint  Patient presents with  . Medical Management of Chronic Issues    HPI:  Pt is a 85 y.o. female seen today for medical management of chronic diseases.    The patient resides in SNF [facility] for safety and care assistance, on Memantine 28mg  qd for memory, her mood is stable on Seroquel 12.5mg  qd, Escitalopram 20mg  qd. Afib, heart rate is in control, on Nebivolol 5mg  qd, Diltiazem 120mg  qd, Eliquis 2.5mg  bid. Anemia, on Vit B12 1035mcg qd, last Hgb 9.6 03/16/18 Constipation, stable on Senokot S I qd, MiraLax qod. Hypothyroidism, on Levothyroxine 59mcg qd, last TSH 0.9 08/23/17. OA pain, managed with Tylenol  650mg  bid.    Past Medical History:  Diagnosis Date  . Anemia, unspecified   . Anorexia   . Anxiety   . Arthritis   . Atherosclerotic cerebrovascular disease   . Backache, unspecified   . Cataract   . Cholelithiasis   . Chronic kidney disease, unspecified   . Closed fracture of right inferior pubic ramus ([place]) 11/12/2013   11/10/13 s/p fall, ED eval  X-ray R hip  1. Possible nondisplaced right inferior pubic ramus fracture.  2. No femur fracture or dislocation   02/06/14 dc prn Motrin and Norco-not used    . Depression   . Diaphragmatic hernia without mention of obstruction or gangrene   . Diseases of lips 03/26/2013  . Diverticulosis of colon (without mention of hemorrhage)   . Hemorrhage of rectum and anus 03/26/2013  . Hyperlipidemia   . Hypertension   . Insomnia, unspecified   . Laceration of occipital scalp 11/12/2013  . Macular degeneration 06/28/2013  . Macular degeneration (senile) of retina, unspecified   . Osteoporosis   . Other malaise and fatigue   . Other sleep disturbances   . Other specified cardiac dysrhythmias(427.89)   . Other specified personal history presenting hazards to health(V15.89)   . Other symptoms involving cardiovascular system   . Pancreatitis   . Personal history of other diseases of circulatory system   . Personal history of other diseases of digestive system    pancreatitis from gallstones  . Personal history of other diseases of respiratory system   . Personal history of traumatic fracture   . Rosacea   .  Senile osteoporosis    with old T11 fracture  . Thyroid disease   . Unspecified disorders of arteries and arterioles   . Unspecified hearing loss   . Unspecified hemorrhoids without mention of complication    Past Surgical History:  Procedure Laterality Date  . ABDOMINAL HYSTERECTOMY     and BSO fibroids  . COLONOSCOPY  05/23/2007  . EYE LID LIFT Right 09/2016   OD  . HIP ARTHROPLASTY Left 10/31/2016   Procedure: ARTHROPLASTY  HIP  (HEMIARTHROPLASTY);  Surgeon: Paralee Cancel, MD;  Location: [place];  Service: Orthopedics;  Laterality: Left;  . JOINT REPLACEMENT  1996  . TOTAL HIP ARTHROPLASTY Right     Allergies  Allergen Reactions  . Ace Inhibitors Cough  . Mirtazapine Other (See Comments)    Nightmares   . Penicillins Rash    Has patient had a PCN reaction causing immediate rash, facial/tongue/throat swelling, SOB or lightheadedness with hypotension: Yes Has patient had a PCN reaction causing severe rash involving mucus membranes or skin necrosis: Unknown Has patient had a PCN reaction that required hospitalization: Unknown Has patient had a PCN reaction occurring within the last 10 years: Unknown If all of the above answers are "NO", then may proceed with Cephalosporin use.     Outpatient Encounter Medications as of 04/19/2018  Medication Sig  . acetaminophen (TYLENOL) 325 MG tablet Take 650 mg by mouth every 12 (twelve) hours.  Marland Kitchen acetaminophen (TYLENOL) 500 MG tablet Take 500 mg by mouth every 8 (eight) hours as needed.  Marland Kitchen aluminum-magnesium hydroxide-simethicone (MAALOX) 361-443-15 MG/5ML SUSP Take 5 mLs by mouth as needed.  Marland Kitchen apixaban (ELIQUIS) 2.5 MG TABS tablet Take 2.5 mg by mouth 2 (two) times daily.  . clindamycin (CLEOCIN) 150 MG capsule Take 600 mg by mouth See admin instructions. ONE HOUR PRIOR TO DENTAL APPOINTMENTS  . diltiazem (CARDIZEM CD) 120 MG 24 hr capsule Take 120 mg by mouth daily.  Marland Kitchen escitalopram (LEXAPRO) 10 MG tablet Take 20 mg by mouth daily.   . hydroxypropyl methylcellulose / hypromellose (ISOPTO TEARS / GONIOVISC) 2.5 % ophthalmic solution Place 1 drop into both eyes 4 (four) times daily.  Marland Kitchen levothyroxine (SYNTHROID, LEVOTHROID) 25 MCG tablet Take 25 mcg by mouth daily before breakfast.   . Lidocaine (ASPERCREME LIDOCAINE) 4 % PTCH Apply 1 patch topically daily. APPLY TO LOWER BACK AND REMOVE AT 8PM  . loratadine (CLARITIN) 10 MG tablet Take 10 mg by mouth daily.   . memantine  (NAMENDA XR) 28 MG CP24 24 hr capsule Take 28 mg by mouth.   . nebivolol (BYSTOLIC) 5 MG tablet Take 5 mg by mouth daily.  . Nutritional Supplements (BENECALORIE PO) Take 1.5 oz by mouth 2 (two) times daily.   . polyethylene glycol (MIRALAX / GLYCOLAX) packet Take 17 g by mouth every other day. For constipation and hold for loose stools  . QUEtiapine (SEROQUEL) 25 MG tablet Take 12.5 mg by mouth daily at 2 PM.  . sennosides-docusate sodium (SENOKOT-S) 8.6-50 MG tablet Take 1 tablet by mouth daily.  . vitamin B-12 (CYANOCOBALAMIN) 1000 MCG tablet Take 1,000 mcg by mouth daily.  . [DISCONTINUED] alum & mag hydroxide-simeth (MAALOX/MYLANTA) 200-200-20 MG/5ML suspension Take 30 mLs by mouth every 4 (four) hours as needed for indigestion. (Patient taking differently: Take 5 mLs by mouth every 4 (four) hours as needed for indigestion, heartburn or flatulence. )  . [DISCONTINUED] Cholecalciferol (VITAMIN D3) 25 MCG (1000 UT) CAPS Take 2 capsules by mouth daily.   No facility-administered  encounter medications on file as of 04/19/2018.    ROS was provided with assistance of staff.  Review of Systems  Constitutional: Negative for activity change, appetite change, chills, diaphoresis, fatigue, fever and unexpected weight change.  HENT: Positive for hearing loss. Negative for congestion and voice change.   Respiratory: Negative for cough, shortness of breath and wheezing.   Cardiovascular: Negative for chest pain, palpitations and leg swelling.  Gastrointestinal: Negative for abdominal distention, abdominal pain, constipation, diarrhea, nausea and vomiting.  Genitourinary: Negative for difficulty urinating, dysuria and urgency.  Musculoskeletal: Positive for arthralgias and gait problem.  Skin: Negative for color change and pallor.  Neurological: Negative for dizziness, speech difficulty, weakness and headaches.       Dementia  Psychiatric/Behavioral: Positive for confusion. Negative for agitation,  behavioral problems, hallucinations and sleep disturbance. The patient is not nervous/anxious.     Immunization History  Administered Date(s) Administered  . DTaP 10/12/2012  . Influenza Whole 11/26/2017  . Influenza-Unspecified 11/22/2012, 12/26/2013, 11/12/2014, 12/09/2015, 12/14/2016  . PPD Test 12/06/2013  . Pneumococcal Conjugate-13 11/05/2016  . Pneumococcal Polysaccharide-23 09/22/2000  . Td 07/08/2005  . Tdap 11/10/2013   Pertinent  Health Maintenance Due  Topic Date Due  . INFLUENZA VACCINE  Completed  . DEXA SCAN  Completed  . PNA vac Low Risk Adult  Completed   Fall Risk  10/25/2017 10/22/2016 01/13/2015 06/28/2013  Falls in the past year? No No No No  Risk for fall due to : - - Impaired balance/gait -   Functional Status Survey:    Vitals:   04/19/18 0948  BP: (!) 160/70  Pulse: (!) 58  Resp: 18  Temp: (!) 96.5 F (35.8 C)  SpO2: 98%  Weight: 118 lb (53.5 kg)  Height: 5' (1.524 m)   Body mass index is 23.05 kg/m. Physical Exam Constitutional:      Appearance: Normal appearance.  HENT:     Head: Normocephalic and atraumatic.     Nose: Nose normal.     Mouth/Throat:     Mouth: Mucous membranes are moist.  Eyes:     Extraocular Movements: Extraocular movements intact.     Pupils: Pupils are equal, round, and reactive to light.  Neck:     Musculoskeletal: Normal range of motion and neck supple.  Cardiovascular:     Rate and Rhythm: Normal rate and regular rhythm.     Heart sounds: Murmur present.  Pulmonary:     Effort: Pulmonary effort is normal.     Breath sounds: No wheezing, rhonchi or rales.  Abdominal:     General: There is no distension.     Palpations: Abdomen is soft.     Tenderness: There is no abdominal tenderness. There is no guarding or rebound.  Musculoskeletal:     Right lower leg: No edema.     Left lower leg: No edema.     Comments: W/c for mobility.   Skin:    General: Skin is warm and dry.  Neurological:     General: No focal  deficit present.     Mental Status: She is alert. Mental status is at baseline.     Cranial Nerves: No cranial nerve deficit.     Motor: No weakness.     Coordination: Coordination normal.     Gait: Gait abnormal.     Comments: Oriented to self.   Psychiatric:        Mood and Affect: Mood normal.        Behavior: Behavior  normal.     Labs reviewed: Recent Labs    06/15/17 06/16/17 0819 08/23/17 03/16/18  NA 142  --  144 145  K 4.7  --  4.1 4.9  CL  --   --  11 111  CO2  --  29 28 22   BUN 32*  --  37* 54*  CREATININE 1.1  --  1.0 1.7*  CALCIUM  --   --  9.0 9.0   Recent Labs    06/15/17 08/23/17 03/16/18  AST 16 14 22   ALT 8 6* 15  ALKPHOS 76 68 93  PROT  --   --  6.1  ALBUMIN  --  3.0 3.0   Recent Labs    08/23/17 08/30/17 03/16/18  WBC 4.5 5.5 6.3  HGB 9.2* 10.3* 39.6*  HCT 38 32* 29*  PLT 198 210 267   Lab Results  Component Value Date   TSH 0.90 08/23/2017   No results found for: HGBA1C No results found for: CHOL, HDL, LDLCALC, LDLDIRECT, TRIG, CHOLHDL  Significant Diagnostic Results in last 30 days:  No results found.  Assessment/Plan A-fib ([facility]) heart rate is in control, continue Nebivolol 5mg  qd, Diltiazem 120mg  qd, Eliquis 2.5mg  bid.  HTN (hypertension) Elevated Sbp, HPI was provided with assistance of staff-she denied headache, change of vision, chest pain/pressure, or palpitation. Continue Diltiazem, Nebivolol.   Hypothyroidism Last TSH 0.90, 08/23/17, continue Levothyroxine 97mcg qd, update TSH   Vascular dementia ([place]) Continue SNF [facility] for safety and care assistance, continue Memantine, update CBC, CMP  Osteoarthritis Pain is well managed, continue Tylenol 650mg  bid.   Acute kidney injury superimposed on CKD ([place]) Last creat 1.73 03/16/18, update CMP  Anemia of chronic disease Last Hgb 9.6 03/16/18, update CBC, continue Vit B12.   Anxiety with depression mood is stable, continue Seroquel 12.5mg  qd, Escitalopram 20mg   qd.  Constipation Stable, continue Senokot S I qd, MiraLax qod.     Family/ staff Communication: plan of care reviewed with the patient and charge nurse.   Labs/tests ordered:  CBC CMP TSH  Time spend 25 minutes.

## 2018-04-19 NOTE — Assessment & Plan Note (Addendum)
Last Hgb 9.6 03/16/18, update CBC, continue Vit B12.

## 2018-04-19 NOTE — Assessment & Plan Note (Signed)
mood is stable, continue Seroquel 12.5mg  qd, Escitalopram 20mg  qd.

## 2018-04-19 NOTE — Assessment & Plan Note (Signed)
Stable, continue Senokot S I qd, MiraLax qod.  

## 2018-04-20 DIAGNOSIS — F0150 Vascular dementia without behavioral disturbance: Secondary | ICD-10-CM | POA: Diagnosis not present

## 2018-04-20 DIAGNOSIS — E039 Hypothyroidism, unspecified: Secondary | ICD-10-CM | POA: Diagnosis not present

## 2018-04-20 LAB — BASIC METABOLIC PANEL
BUN: 30 — AB (ref 4–21)
Creatinine: 1.3 — AB (ref 0.5–1.1)
Glucose: 79
Potassium: 4.4 (ref 3.4–5.3)
Sodium: 140 (ref 137–147)

## 2018-04-20 LAB — CBC AND DIFFERENTIAL
HCT: 27 — AB (ref 36–46)
Hemoglobin: 8.9 — AB (ref 12.0–16.0)
Platelets: 229 (ref 150–399)
WBC: 7.2

## 2018-04-20 LAB — HEPATIC FUNCTION PANEL
ALT: 10 (ref 7–35)
AST: 18 (ref 13–35)
Alkaline Phosphatase: 74 (ref 25–125)
Bilirubin, Total: 0.2

## 2018-04-20 LAB — TSH: TSH: 3.26 (ref <=5.90)

## 2018-05-17 ENCOUNTER — Encounter: Payer: Self-pay | Admitting: Nurse Practitioner

## 2018-05-17 ENCOUNTER — Non-Acute Institutional Stay (SKILLED_NURSING_FACILITY): Payer: Medicare Other | Admitting: Nurse Practitioner

## 2018-05-17 DIAGNOSIS — F29 Unspecified psychosis not due to a substance or known physiological condition: Secondary | ICD-10-CM

## 2018-05-17 DIAGNOSIS — F418 Other specified anxiety disorders: Secondary | ICD-10-CM

## 2018-05-17 DIAGNOSIS — I10 Essential (primary) hypertension: Secondary | ICD-10-CM | POA: Diagnosis not present

## 2018-05-17 DIAGNOSIS — I480 Paroxysmal atrial fibrillation: Secondary | ICD-10-CM | POA: Diagnosis not present

## 2018-05-17 DIAGNOSIS — F0151 Vascular dementia with behavioral disturbance: Secondary | ICD-10-CM | POA: Diagnosis not present

## 2018-05-17 DIAGNOSIS — F01518 Vascular dementia, unspecified severity, with other behavioral disturbance: Secondary | ICD-10-CM

## 2018-05-17 DIAGNOSIS — M159 Polyosteoarthritis, unspecified: Secondary | ICD-10-CM

## 2018-05-17 DIAGNOSIS — E038 Other specified hypothyroidism: Secondary | ICD-10-CM

## 2018-05-17 DIAGNOSIS — E063 Autoimmune thyroiditis: Secondary | ICD-10-CM

## 2018-05-17 DIAGNOSIS — K5900 Constipation, unspecified: Secondary | ICD-10-CM

## 2018-05-17 DIAGNOSIS — M150 Primary generalized (osteo)arthritis: Secondary | ICD-10-CM

## 2018-05-17 DIAGNOSIS — D638 Anemia in other chronic diseases classified elsewhere: Secondary | ICD-10-CM

## 2018-05-17 LAB — COMPLETE METABOLIC PANEL WITH GFR
Albumin: 2.8
Calcium: 8.4
Carbon Dioxide, Total: 22
Chloride: 111
EGFR (Non-African Amer.): 35
Globulin: 2.2
T3, Free: 33
T4,Free (Direct): 1.6
Thyroxine (T4): 4.8
Total Protein: 5 g/dL

## 2018-05-17 NOTE — Assessment & Plan Note (Signed)
Stable, continue Seroquel.

## 2018-05-17 NOTE — Assessment & Plan Note (Signed)
Her mood is stable, continue Seroquel 12.5mg  qd, Escitalopram 20mg  qd.

## 2018-05-17 NOTE — Assessment & Plan Note (Signed)
Continue SN[facility] for safety and care assistance, continue Memantine 14mg  qd.

## 2018-05-17 NOTE — Assessment & Plan Note (Signed)
Pain is managed, continue Tylenol 650mg  bid, 500mg  q8h prn, w/c for mobility.

## 2018-05-17 NOTE — Progress Notes (Signed)
Location:  [place] Room Number: 37 Place of Service:  SNF (31) Provider:  Marlana Latus  NP  Virgie Dad, MD  Patient Care Team: Virgie Dad, MD as PCP - General (Internal Medicine) Mast, Man X, NP as Nurse Practitioner (Nurse Practitioner) Wilford Corner, MD as Consulting Physician (Gastroenterology) Sanda Klein, MD as Consulting Physician (Cardiology)  Extended Emergency Contact Information Primary Emergency Contact: [name],Pat Address: [place] [address] [name] of [place] Phone: [phone] Mobile Phone: [phone] Relation: Daughter  Code Status: DNR Goals of care: Advanced Directive information Advanced Directives 05/17/2018  Does Patient Have a Medical Advance Directive? Yes  Type of Paramedic of [place];Out of facility DNR (pink MOST or yellow form)  Does patient want to make changes to medical advance directive? No - Patient declined  Copy of [place] in Chart? Yes - validated most recent copy scanned in chart (See row information)  Pre-existing out of facility DNR order (yellow form or pink MOST form) Yellow form placed in chart (order not valid for inpatient use);Pink MOST form placed in chart (order not valid for inpatient use)     Chief Complaint  Patient presents with  . Medical Management of Chronic Issues    HPI:  Pt is a 85 y.o. female seen today for medical management of chronic diseases.     The patient resides in SNF [facility] for safety and care assistance, her goal of care is comfort measures, on Memantine 14mg  qd for memory. Hx of HTN, blood pressure is controlled on Nebivolol 5mg  qd, Diltiazem 120mg  qd. Afib, heart rate is in control, on Eliquis 2.5mg  bid. Anemia, stable on Vit B12 1067mcg qd. Constipation, stable on SEnokot S I qd, MiraLax qod. Her mood is stable on Seroquel 12.5mg  qd, Escitalopram 20mg  qd. Hypothyroidism, on  Levothyroxine 72mcg qd, last TSH 3.26 04/20/18. OA pain is managed with Tylenol 650mg  bid, 500mg  q8h prn, w/c for mobility. Last Hgb 8.9 04/20/18   Past Medical History:  Diagnosis Date  . Anemia, unspecified   . Anorexia   . Anxiety   . Arthritis   . Atherosclerotic cerebrovascular disease   . Backache, unspecified   . Cataract   . Cholelithiasis   . Chronic kidney disease, unspecified   . Closed fracture of right inferior pubic ramus ([place]) 11/12/2013   11/10/13 s/p fall, ED eval  X-ray R hip  1. Possible nondisplaced right inferior pubic ramus fracture.  2. No femur fracture or dislocation   02/06/14 dc prn Motrin and Norco-not used    . Depression   . Diaphragmatic hernia without mention of obstruction or gangrene   . Diseases of lips 03/26/2013  . Diverticulosis of colon (without mention of hemorrhage)   . Hemorrhage of rectum and anus 03/26/2013  . Hyperlipidemia   . Hypertension   . Insomnia, unspecified   . Laceration of occipital scalp 11/12/2013  . Macular degeneration 06/28/2013  . Macular degeneration (senile) of retina, unspecified   . Osteoporosis   . Other malaise and fatigue   . Other sleep disturbances   . Other specified cardiac dysrhythmias(427.89)   . Other specified personal history presenting hazards to health(V15.89)   . Other symptoms involving cardiovascular system   . Pancreatitis   . Personal history of other diseases of circulatory system   . Personal history of other diseases of digestive system    pancreatitis from gallstones  .  Personal history of other diseases of respiratory system   . Personal history of traumatic fracture   . Rosacea   . Senile osteoporosis    with old T11 fracture  . Thyroid disease   . Unspecified disorders of arteries and arterioles   . Unspecified hearing loss   . Unspecified hemorrhoids without mention of complication    Past Surgical History:  Procedure Laterality Date  . ABDOMINAL HYSTERECTOMY     and BSO fibroids  .  COLONOSCOPY  05/23/2007  . EYE LID LIFT Right 09/2016   OD  . HIP ARTHROPLASTY Left 10/31/2016   Procedure: ARTHROPLASTY  HIP (HEMIARTHROPLASTY);  Surgeon: Paralee Cancel, MD;  Location: [facility];  Service: Orthopedics;  Laterality: Left;  . JOINT REPLACEMENT  1996  . TOTAL HIP ARTHROPLASTY Right     Allergies  Allergen Reactions  . Ace Inhibitors Cough  . Mirtazapine Other (See Comments)    Nightmares   . Penicillins Rash    Has patient had a PCN reaction causing immediate rash, facial/tongue/throat swelling, SOB or lightheadedness with hypotension: Yes Has patient had a PCN reaction causing severe rash involving mucus membranes or skin necrosis: Unknown Has patient had a PCN reaction that required hospitalization: Unknown Has patient had a PCN reaction occurring within the last 10 years: Unknown If all of the above answers are "NO", then may proceed with Cephalosporin use.     Outpatient Encounter Medications as of 05/17/2018  Medication Sig  . acetaminophen (TYLENOL) 325 MG tablet Take 650 mg by mouth every 12 (twelve) hours.  Marland Kitchen acetaminophen (TYLENOL) 500 MG tablet Take 500 mg by mouth every 8 (eight) hours as needed.  Marland Kitchen aluminum-magnesium hydroxide-simethicone (MAALOX) 875-643-32 MG/5ML SUSP Take 5 mLs by mouth as needed.  Marland Kitchen apixaban (ELIQUIS) 2.5 MG TABS tablet Take 2.5 mg by mouth 2 (two) times daily.  . clindamycin (CLEOCIN) 150 MG capsule Take 600 mg by mouth See admin instructions. ONE HOUR PRIOR TO DENTAL APPOINTMENTS  . diltiazem (CARDIZEM CD) 120 MG 24 hr capsule Take 120 mg by mouth daily.  Marland Kitchen escitalopram (LEXAPRO) 10 MG tablet Take 20 mg by mouth daily. Take 2 tablets = 20 mg  . hydroxypropyl methylcellulose / hypromellose (ISOPTO TEARS / GONIOVISC) 2.5 % ophthalmic solution Place 1 drop into both eyes 4 (four) times daily.  Marland Kitchen levothyroxine (SYNTHROID, LEVOTHROID) 25 MCG tablet Take 25 mcg by mouth daily before breakfast.   . Lidocaine (ASPERCREME LIDOCAINE) 4 % PTCH Apply 1  patch topically daily. APPLY TO LOWER BACK AT 8 AM AND REMOVE AT 8PM.  . loratadine (CLARITIN) 10 MG tablet Take 10 mg by mouth daily.   . memantine (NAMENDA XR) 14 MG CP24 24 hr capsule Take 14 mg by mouth daily.  . nebivolol (BYSTOLIC) 5 MG tablet Take 5 mg by mouth daily.  . Nutritional Supplements (BENECALORIE PO) Take 1.5 oz by mouth 2 (two) times daily.   . polyethylene glycol (MIRALAX / GLYCOLAX) packet Take 17 g by mouth every other day. For constipation and hold for loose stools  . QUEtiapine (SEROQUEL) 25 MG tablet Take 12.5 mg by mouth daily at 2 PM.  . sennosides-docusate sodium (SENOKOT-S) 8.6-50 MG tablet Take 1 tablet by mouth daily.  . vitamin B-12 (CYANOCOBALAMIN) 1000 MCG tablet Take 1,000 mcg by mouth daily.  . [DISCONTINUED] memantine (NAMENDA XR) 28 MG CP24 24 hr capsule Take 28 mg by mouth.    No facility-administered encounter medications on file as of 05/17/2018.    ROS was  provided with assistance of staff Review of Systems  Constitutional: Negative for activity change, appetite change, chills, diaphoresis, fatigue, fever and unexpected weight change.  HENT: Positive for hearing loss. Negative for congestion and voice change.   Respiratory: Negative for cough, shortness of breath and wheezing.   Cardiovascular: Negative for chest pain, palpitations and leg swelling.  Gastrointestinal: Negative for abdominal distention, abdominal pain, constipation, diarrhea, nausea and vomiting.  Genitourinary: Negative for difficulty urinating, dysuria and urgency.  Musculoskeletal: Positive for back pain and gait problem.  Skin: Negative for color change and pallor.  Neurological: Negative for dizziness, facial asymmetry, speech difficulty, weakness and headaches.       Dementia  Psychiatric/Behavioral: Positive for confusion. Negative for agitation, behavioral problems, hallucinations and sleep disturbance. The patient is not nervous/anxious.     Immunization History   Administered Date(s) Administered  . DTaP 10/12/2012  . Influenza Whole 11/26/2017  . Influenza-Unspecified 11/22/2012, 12/26/2013, 11/12/2014, 12/09/2015, 12/14/2016  . PPD Test 12/06/2013  . Pneumococcal Conjugate-13 11/05/2016  . Pneumococcal Polysaccharide-23 09/22/2000  . Td 07/08/2005  . Tdap 11/10/2013   Pertinent  Health Maintenance Due  Topic Date Due  . INFLUENZA VACCINE  Completed  . DEXA SCAN  Completed  . PNA vac Low Risk Adult  Completed   Fall Risk  10/25/2017 10/22/2016 01/13/2015 06/28/2013  Falls in the past year? No No No No  Risk for fall due to : - - Impaired balance/gait -   Functional Status Survey:    Vitals:   05/17/18 0841  BP: (!) 150/80  Pulse: 62  Resp: 18  Temp: 98.2 F (36.8 C)  SpO2: 94%  Weight: 118 lb 8 oz (53.8 kg)  Height: 5' (1.524 m)   Body mass index is 23.14 kg/m. Physical Exam Vitals signs and nursing note reviewed.  Constitutional:      General: She is not in acute distress.    Appearance: Normal appearance. She is normal weight. She is not ill-appearing, toxic-appearing or diaphoretic.  HENT:     Head: Normocephalic and atraumatic.     Nose: Nose normal.     Mouth/Throat:     Mouth: Mucous membranes are moist.  Eyes:     Extraocular Movements: Extraocular movements intact.     Pupils: Pupils are equal, round, and reactive to light.  Neck:     Musculoskeletal: Normal range of motion and neck supple.  Cardiovascular:     Rate and Rhythm: Normal rate and regular rhythm.     Heart sounds: Murmur present.  Pulmonary:     Effort: Pulmonary effort is normal.     Breath sounds: No wheezing, rhonchi or rales.  Abdominal:     General: There is no distension.     Palpations: Abdomen is soft.     Tenderness: There is no abdominal tenderness. There is no guarding or rebound.  Musculoskeletal:     Right lower leg: No edema.     Left lower leg: No edema.     Comments: W/c for mobility  Skin:    General: Skin is warm and dry.   Neurological:     General: No focal deficit present.     Mental Status: She is alert. Mental status is at baseline.     Cranial Nerves: No cranial nerve deficit.     Motor: No weakness.     Coordination: Coordination normal.     Gait: Gait abnormal.     Comments: Oriented to self.   Psychiatric:  Mood and Affect: Mood normal.        Behavior: Behavior normal.     Labs reviewed: Recent Labs    08/23/17 03/16/18 04/20/18  NA 144 145 140  K 4.1 4.9 4.4  CL 11 111 111  CO2 28 22 22   BUN 37* 54* 30*  CREATININE 1.0 1.7* 1.3*  CALCIUM 9.0 9.0 8.4   Recent Labs    08/23/17 03/16/18 04/20/18  AST 14 22 18   ALT 6* 15 10  ALKPHOS 68 93 74  PROT  --  6.1 5.0  ALBUMIN 3.0 3.0 2.8   Recent Labs    08/30/17 03/16/18 04/20/18  WBC 5.5 6.3 7.2  HGB 10.3* 39.6* 8.9*  HCT 32* 29* 27*  PLT 210 267 229   Lab Results  Component Value Date   TSH 3.26 04/20/2018   No results found for: HGBA1C No results found for: CHOL, HDL, LDLCALC, LDLDIRECT, TRIG, CHOLHDL  Significant Diagnostic Results in last 30 days:  No results found.  Assessment/Plan A-fib (HCC) Heart rate is in control, continue Eliquis 2.5mg  bid po.   HTN (hypertension) Mildly elevated Sbp, asymptomatic, continue Diltiazem 120mg  qd, Nebivolol 5mg  qd.   Hypothyroidism Continue Levothyroxine 45mcg po qd, last TSH 3.26 04/20/18  Vascular dementia ([place]) Continue SN[facility] for safety and care assistance, continue Memantine 14mg  qd.   Osteoarthritis Pain is managed, continue Tylenol 650mg  bid, 500mg  q8h prn, w/c for mobility.  Anemia of chronic disease Stable, continue Vit B12 1030mcg qd, last Hgb 8.9 04/20/18  Anxiety with depression Her mood is stable, continue Seroquel 12.5mg  qd, Escitalopram 20mg  qd.   Constipation Stable, continue Seonokot S I qd, MiraLax qod.   Psychosis ([place]) Stable, continue Seroquel.      Family/ staff Communication: plan of care reviewed with the patient and charge nurse.    Labs/tests ordered:  none  Time spend 25 minutes.

## 2018-05-17 NOTE — Assessment & Plan Note (Signed)
Stable, continue Seonokot S I qd, MiraLax qod.

## 2018-05-17 NOTE — Assessment & Plan Note (Signed)
Continue Levothyroxine 38mcg po qd, last TSH 3.26 04/20/18

## 2018-05-17 NOTE — Assessment & Plan Note (Signed)
Heart rate is in control, continue Eliquis 2.5mg  bid po.

## 2018-05-17 NOTE — Assessment & Plan Note (Signed)
Stable, continue Vit B12 1032mcg qd, last Hgb 8.9 04/20/18

## 2018-05-17 NOTE — Assessment & Plan Note (Signed)
Mildly elevated Sbp, asymptomatic, continue Diltiazem 120mg  qd, Nebivolol 5mg  qd.

## 2018-06-16 ENCOUNTER — Encounter: Payer: Self-pay | Admitting: Internal Medicine

## 2018-06-16 ENCOUNTER — Non-Acute Institutional Stay (SKILLED_NURSING_FACILITY): Payer: Medicare Other | Admitting: Internal Medicine

## 2018-06-16 DIAGNOSIS — I10 Essential (primary) hypertension: Secondary | ICD-10-CM | POA: Diagnosis not present

## 2018-06-16 DIAGNOSIS — M159 Polyosteoarthritis, unspecified: Secondary | ICD-10-CM

## 2018-06-16 DIAGNOSIS — M150 Primary generalized (osteo)arthritis: Secondary | ICD-10-CM

## 2018-06-16 DIAGNOSIS — E063 Autoimmune thyroiditis: Secondary | ICD-10-CM

## 2018-06-16 DIAGNOSIS — D638 Anemia in other chronic diseases classified elsewhere: Secondary | ICD-10-CM

## 2018-06-16 DIAGNOSIS — E038 Other specified hypothyroidism: Secondary | ICD-10-CM | POA: Diagnosis not present

## 2018-06-16 DIAGNOSIS — F01518 Vascular dementia, unspecified severity, with other behavioral disturbance: Secondary | ICD-10-CM

## 2018-06-16 DIAGNOSIS — F0151 Vascular dementia with behavioral disturbance: Secondary | ICD-10-CM

## 2018-06-16 DIAGNOSIS — F418 Other specified anxiety disorders: Secondary | ICD-10-CM

## 2018-06-16 DIAGNOSIS — I480 Paroxysmal atrial fibrillation: Secondary | ICD-10-CM | POA: Diagnosis not present

## 2018-06-16 NOTE — Progress Notes (Signed)
Location:  [place] Room Number: 71 Place of Service:  SNF [phone]) Provider:  Veleta Miners  MD  Virgie Dad, MD  Patient Care Team: Virgie Dad, MD as PCP - General (Internal Medicine) Mast, Man X, NP as Nurse Practitioner (Nurse Practitioner) Wilford Corner, MD as Consulting Physician (Gastroenterology) Sanda Klein, MD as Consulting Physician (Cardiology)  Extended Emergency Contact Information Primary Emergency Contact: [name],Pat Address: [place] [address] [name] of [place] Phone: [phone] Mobile Phone: [phone] Relation: Daughter  Code Status:  DNR Goals of care: Advanced Directive information Advanced Directives 06/16/2018  Does Patient Have a Medical Advance Directive? Yes  Type of Paramedic of [place];Out of facility DNR (pink MOST or yellow form)  Does patient want to make changes to medical advance directive? -  Copy of [place] in Chart? Yes - validated most recent copy scanned in chart (See row information)  Pre-existing out of facility DNR order (yellow form or pink MOST form) Yellow form placed in chart (order not valid for inpatient use);Pink MOST form placed in chart (order not valid for inpatient use)     Chief Complaint  Patient presents with  . Medical Management of Chronic Issues    C/o - pressure wound    HPI:  Pt is a 85 y.o. female seen today for medical management of chronic diseases.   She is long term resident of facility. She is Comfort care Most Form in the chart Patient has a history of hypertension, atrial fibrillation on Eliquis, CHF and vascular dementia.,Hypothyroidism B12 Def, Anemia She was seen today for routine visit. Nurses also wanted me to see her as she has developed skin break down in her bottom. Patient is unable to give any history. She mumbles but does not really talk. Her appetite is fair and needs  assistant with feeding. She is mostly Wheelchair bound.  Past Medical History:  Diagnosis Date  . Anemia, unspecified   . Anorexia   . Anxiety   . Arthritis   . Atherosclerotic cerebrovascular disease   . Backache, unspecified   . Cataract   . Cholelithiasis   . Chronic kidney disease, unspecified   . Closed fracture of right inferior pubic ramus ([place]) 11/12/2013   11/10/13 s/p fall, ED eval  X-ray R hip  1. Possible nondisplaced right inferior pubic ramus fracture.  2. No femur fracture or dislocation   02/06/14 dc prn Motrin and Norco-not used    . Depression   . Diaphragmatic hernia without mention of obstruction or gangrene   . Diseases of lips 03/26/2013  . Diverticulosis of colon (without mention of hemorrhage)   . Hemorrhage of rectum and anus 03/26/2013  . Hyperlipidemia   . Hypertension   . Insomnia, unspecified   . Laceration of occipital scalp 11/12/2013  . Macular degeneration 06/28/2013  . Macular degeneration (senile) of retina, unspecified   . Osteoporosis   . Other malaise and fatigue   . Other sleep disturbances   . Other specified cardiac dysrhythmias(427.89)   . Other specified personal history presenting hazards to health(V15.89)   . Other symptoms involving cardiovascular system   . Pancreatitis   . Personal history of other diseases of circulatory system   . Personal history of other diseases of digestive system    pancreatitis from gallstones  . Personal history of other diseases of respiratory system   . Personal history  of traumatic fracture   . Rosacea   . Senile osteoporosis    with old T11 fracture  . Thyroid disease   . Unspecified disorders of arteries and arterioles   . Unspecified hearing loss   . Unspecified hemorrhoids without mention of complication    Past Surgical History:  Procedure Laterality Date  . ABDOMINAL HYSTERECTOMY     and BSO fibroids  . COLONOSCOPY  05/23/2007  . EYE LID LIFT Right 09/2016   OD  . HIP ARTHROPLASTY Left  10/31/2016   Procedure: ARTHROPLASTY  HIP (HEMIARTHROPLASTY);  Surgeon: Paralee Cancel, MD;  Location: [facility];  Service: Orthopedics;  Laterality: Left;  . JOINT REPLACEMENT  1996  . TOTAL HIP ARTHROPLASTY Right     Allergies  Allergen Reactions  . Ace Inhibitors Cough  . Mirtazapine Other (See Comments)    Nightmares   . Penicillins Rash    Has patient had a PCN reaction causing immediate rash, facial/tongue/throat swelling, SOB or lightheadedness with hypotension: Yes Has patient had a PCN reaction causing severe rash involving mucus membranes or skin necrosis: Unknown Has patient had a PCN reaction that required hospitalization: Unknown Has patient had a PCN reaction occurring within the last 10 years: Unknown If all of the above answers are "NO", then may proceed with Cephalosporin use.     Outpatient Encounter Medications as of 06/16/2018  Medication Sig  . acetaminophen (TYLENOL) 325 MG tablet Take 650 mg by mouth every 12 (twelve) hours.  Marland Kitchen acetaminophen (TYLENOL) 500 MG tablet Take 500 mg by mouth every 8 (eight) hours as needed.  Marland Kitchen aluminum-magnesium hydroxide-simethicone (MAALOX) 283-151-76 MG/5ML SUSP Take 5 mLs by mouth as needed.  Marland Kitchen apixaban (ELIQUIS) 2.5 MG TABS tablet Take 2.5 mg by mouth 2 (two) times daily.  . clindamycin (CLEOCIN) 150 MG capsule Take 600 mg by mouth See admin instructions. ONE HOUR PRIOR TO DENTAL APPOINTMENTS  . diltiazem (CARDIZEM CD) 120 MG 24 hr capsule Take 120 mg by mouth daily.  Marland Kitchen escitalopram (LEXAPRO) 10 MG tablet Take 20 mg by mouth daily. Take 2 tablets = 20 mg  . hydroxypropyl methylcellulose / hypromellose (ISOPTO TEARS / GONIOVISC) 2.5 % ophthalmic solution Place 1 drop into both eyes 4 (four) times daily.  Marland Kitchen levothyroxine (SYNTHROID, LEVOTHROID) 25 MCG tablet Take 25 mcg by mouth daily before breakfast.   . Lidocaine (ASPERCREME LIDOCAINE) 4 % PTCH Apply 1 patch topically daily. APPLY TO LOWER BACK AT 8 AM AND REMOVE AT 8PM.  . loratadine  (CLARITIN) 10 MG tablet Take 10 mg by mouth daily.   . memantine (NAMENDA XR) 14 MG CP24 24 hr capsule Take 14 mg by mouth daily.  . nebivolol (BYSTOLIC) 5 MG tablet Take 5 mg by mouth daily.  . Nutritional Supplements (BENECALORIE PO) Take 1.5 oz by mouth 2 (two) times daily.   . polyethylene glycol (MIRALAX / GLYCOLAX) packet Take 17 g by mouth every other day. For constipation and hold for loose stools  . QUEtiapine (SEROQUEL) 25 MG tablet Take 12.5 mg by mouth daily at 2 PM.  . sennosides-docusate sodium (SENOKOT-S) 8.6-50 MG tablet Take 1 tablet by mouth daily.  . vitamin B-12 (CYANOCOBALAMIN) 1000 MCG tablet Take 1,000 mcg by mouth daily.   No facility-administered encounter medications on file as of 06/16/2018.     Review of Systems  Unable to perform ROS: Dementia    Immunization History  Administered Date(s) Administered  . DTaP 10/12/2012  . Influenza Whole 11/26/2017  . Influenza-Unspecified 11/22/2012, 12/26/2013,  11/12/2014, 12/09/2015, 12/14/2016  . PPD Test 12/06/2013  . Pneumococcal Conjugate-13 11/05/2016  . Pneumococcal Polysaccharide-23 09/22/2000  . Td 07/08/2005  . Tdap 11/10/2013   Pertinent  Health Maintenance Due  Topic Date Due  . INFLUENZA VACCINE  09/23/2018  . DEXA SCAN  Completed  . PNA vac Low Risk Adult  Completed   Fall Risk  10/25/2017 10/22/2016 01/13/2015 06/28/2013  Falls in the past year? No No No No  Risk for fall due to : - - Impaired balance/gait -   Functional Status Survey:    Vitals:   06/16/18 1356  BP: (!) 150/60  Pulse: 64  Resp: 18  Temp: 98 F (36.7 C)  SpO2: 94%  Weight: 116 lb (52.6 kg)  Height: 5' (1.524 m)   Body mass index is 22.65 kg/m. Physical Exam Vitals signs reviewed.  Constitutional:      Appearance: Normal appearance.  HENT:     Head: Normocephalic.     Nose: Nose normal.     Mouth/Throat:     Mouth: Mucous membranes are moist.     Pharynx: Oropharynx is clear.  Eyes:     Pupils: Pupils are equal,  round, and reactive to light.  Neck:     Musculoskeletal: Neck supple.  Cardiovascular:     Rate and Rhythm: Normal rate and regular rhythm.  Pulmonary:     Effort: Pulmonary effort is normal.     Breath sounds: Normal breath sounds.  Abdominal:     General: Abdomen is flat. Bowel sounds are normal.     Palpations: Abdomen is soft.  Musculoskeletal:        General: No swelling.  Skin:    General: Skin is warm and dry.     Comments: Has stage 2 pressure wound near crack of her sacrum  Neurological:     General: No focal deficit present.     Mental Status: She is alert.     Comments: Doe snot respond or follows any commands  Psychiatric:        Mood and Affect: Mood normal.        Thought Content: Thought content normal.     Labs reviewed: Recent Labs    08/23/17 03/16/18 04/20/18  NA 144 145 140  K 4.1 4.9 4.4  CL 11 111 111  CO2 28 22 22   BUN 37* 54* 30*  CREATININE 1.0 1.7* 1.3*  CALCIUM 9.0 9.0 8.4   Recent Labs    08/23/17 03/16/18 04/20/18  AST 14 22 18   ALT 6* 15 10  ALKPHOS 68 93 74  PROT  --  6.1 5.0  ALBUMIN 3.0 3.0 2.8   Recent Labs    08/30/17 03/16/18 04/20/18  WBC 5.5 6.3 7.2  HGB 10.3* 39.6* 8.9*  HCT 32* 29* 27*  PLT 210 267 229   Lab Results  Component Value Date   TSH 3.26 04/20/2018   No results found for: HGBA1C No results found for: CHOL, HDL, LDLCALC, LDLDIRECT, TRIG, CHOLHDL  Significant Diagnostic Results in last 30 days:  No results found.  Assessment/Plan Essential hypertension BP controlled on bystolic  Paroxysmal atrial fibrillation (HCC) On Eliquis and Diltiazem  Hypothyroidism  TSH was normal in 02/20  Vascular dementia with behavior disturbance  On Namenda and low dose of Seroquel Continue Full Care Patient is comfort care  Primary osteoarthritis involving multiple joints Pain seems to be controlled on tyelenol  Anemia of chronic disease Her Hgb is low Can be GI  loss  with her being on Eliquis Will stat  her on low dose of iron Not candidate for Aggressive work up Will consider stopping eliquis if needed  Anxiety with depression On Celexa Pressure wound Has stage 2 ulcer Hydrocolloid Dressing and try to keep pressure off.     Family/ staff Communication:   Labs/tests ordered:    Total time spent in this patient care encounter was  25_  minutes; greater than 50% of the visit spent patient and staff, reviewing records , Labs and coordinating care for problems addressed at this encounter.

## 2018-07-06 ENCOUNTER — Non-Acute Institutional Stay (SKILLED_NURSING_FACILITY): Payer: Medicare Other | Admitting: Nurse Practitioner

## 2018-07-06 ENCOUNTER — Encounter: Payer: Self-pay | Admitting: Nurse Practitioner

## 2018-07-06 DIAGNOSIS — I10 Essential (primary) hypertension: Secondary | ICD-10-CM | POA: Diagnosis not present

## 2018-07-06 DIAGNOSIS — K5900 Constipation, unspecified: Secondary | ICD-10-CM

## 2018-07-06 DIAGNOSIS — E038 Other specified hypothyroidism: Secondary | ICD-10-CM

## 2018-07-06 DIAGNOSIS — D638 Anemia in other chronic diseases classified elsewhere: Secondary | ICD-10-CM

## 2018-07-06 DIAGNOSIS — F0151 Vascular dementia with behavioral disturbance: Secondary | ICD-10-CM

## 2018-07-06 DIAGNOSIS — F418 Other specified anxiety disorders: Secondary | ICD-10-CM

## 2018-07-06 DIAGNOSIS — F01518 Vascular dementia, unspecified severity, with other behavioral disturbance: Secondary | ICD-10-CM

## 2018-07-06 DIAGNOSIS — I480 Paroxysmal atrial fibrillation: Secondary | ICD-10-CM

## 2018-07-06 DIAGNOSIS — E063 Autoimmune thyroiditis: Secondary | ICD-10-CM

## 2018-07-06 NOTE — Assessment & Plan Note (Signed)
Blood pressure is controlled, continue Nebivolol 5mg  qd, Diltiazem 120mg  qd.

## 2018-07-06 NOTE — Assessment & Plan Note (Signed)
Her mood is stable, continue Escitalopram 10mg  qd, Quetiapine 12.5mg  qd

## 2018-07-06 NOTE — Assessment & Plan Note (Signed)
Heart rate is in control, continue Diltiazem 120mg  qd, Nebivolol 5mg  qd, Eliquis 2.5mg  bid

## 2018-07-06 NOTE — Assessment & Plan Note (Signed)
No further labs per HPOA, continue Vit B12 1030mcg qd, Fe 325mg  qd, last Hgb 8.9 04/20/18

## 2018-07-06 NOTE — Assessment & Plan Note (Signed)
Continue SNF [facility] for safety and care assistance, w/c for mobility, continue Memantine 14mg  qd

## 2018-07-06 NOTE — Progress Notes (Signed)
Location:   SNF [place] Room Number: 44/A Place of Service:  SNF (31) Provider: [facility] Mast NP  Virgie Dad, MD  Patient Care Team: Virgie Dad, MD as PCP - General (Internal Medicine) Mast, Man X, NP as Nurse Practitioner (Nurse Practitioner) Wilford Corner, MD as Consulting Physician (Gastroenterology) Sanda Klein, MD as Consulting Physician (Cardiology)  Extended Emergency Contact Information Primary Emergency Contact: [name],Pat Address: [address] [address] [name] Phone: [phone] Mobile Phone: [phone] Relation: Daughter  Code Status:  DNR Goals of care: Advanced Directive information Advanced Directives 07/06/2018  Does Patient Have a Medical Advance Directive? Yes  Type of Paramedic of [place];Out of facility DNR (pink MOST or yellow form)  Does patient want to make changes to medical advance directive? No - Patient declined  Copy of [place] in Chart? Yes - validated most recent copy scanned in chart (See row information)  Pre-existing out of facility DNR order (yellow form or pink MOST form) Yellow form placed in chart (order not valid for inpatient use);Pink MOST form placed in chart (order not valid for inpatient use)     Chief Complaint  Patient presents with  . Medical Management of Chronic Issues    Routine visit     HPI:  Pt is a 85 y.o. female seen today for medical management of chronic diseases.    The patient resides in SNF [facility] for safety and care assistance, on Memantine 14mg  qd for memory. Her mood is stable on Escitalopram 10mg  qd, Quetiapine 12.5mg  qd. Anemia, stable, on Vit B12 1060mcg qd, Fe daily, last Hgb 8.9 04/20/18. No constipation, on Senokot S I qd, MiraLax qod. HTN/Afib, blood pressure and heart rate are controlled on Nebivolol 5mg  qd, Diltiazem 120mg  qd. Hypothyroidism, on Levothyroxine 32mcg qd, last TSH 3.26 04/20/18.     Past Medical History:  Diagnosis Date  . Anemia, unspecified   . Anorexia   . Anxiety   . Arthritis   . Atherosclerotic cerebrovascular disease   . Backache, unspecified   . Cataract   . Cholelithiasis   . Chronic kidney disease, unspecified   . Closed fracture of right inferior pubic ramus ([place]) 11/12/2013   11/10/13 s/p fall, ED eval  X-ray R hip  1. Possible nondisplaced right inferior pubic ramus fracture.  2. No femur fracture or dislocation   02/06/14 dc prn Motrin and Norco-not used    . Depression   . Diaphragmatic hernia without mention of obstruction or gangrene   . Diseases of lips 03/26/2013  . Diverticulosis of colon (without mention of hemorrhage)   . Hemorrhage of rectum and anus 03/26/2013  . Hyperlipidemia   . Hypertension   . Insomnia, unspecified   . Laceration of occipital scalp 11/12/2013  . Macular degeneration 06/28/2013  . Macular degeneration (senile) of retina, unspecified   . Osteoporosis   . Other malaise and fatigue   . Other sleep disturbances   . Other specified cardiac dysrhythmias(427.89)   . Other specified personal history presenting hazards to health(V15.89)   . Other symptoms involving cardiovascular system   . Pancreatitis   . Personal history of other diseases of circulatory system   . Personal history of other diseases of digestive system    pancreatitis from gallstones  . Personal history of other diseases of respiratory system   . Personal history of traumatic fracture   . Rosacea   .  Senile osteoporosis    with old T11 fracture  . Thyroid disease   . Unspecified disorders of arteries and arterioles   . Unspecified hearing loss   . Unspecified hemorrhoids without mention of complication    Past Surgical History:  Procedure Laterality Date  . ABDOMINAL HYSTERECTOMY     and BSO fibroids  . COLONOSCOPY  05/23/2007  . EYE LID LIFT Right 09/2016   OD  . HIP ARTHROPLASTY Left 10/31/2016   Procedure: ARTHROPLASTY  HIP (HEMIARTHROPLASTY);   Surgeon: Paralee Cancel, MD;  Location: [facility];  Service: Orthopedics;  Laterality: Left;  . JOINT REPLACEMENT  1996  . TOTAL HIP ARTHROPLASTY Right     Allergies  Allergen Reactions  . Ace Inhibitors Cough  . Mirtazapine Other (See Comments)    Nightmares   . Penicillins Rash    Has patient had a PCN reaction causing immediate rash, facial/tongue/throat swelling, SOB or lightheadedness with hypotension: Yes Has patient had a PCN reaction causing severe rash involving mucus membranes or skin necrosis: Unknown Has patient had a PCN reaction that required hospitalization: Unknown Has patient had a PCN reaction occurring within the last 10 years: Unknown If all of the above answers are "NO", then may proceed with Cephalosporin use.     Allergies as of 07/06/2018      Reactions   Ace Inhibitors Cough   Mirtazapine Other (See Comments)   Nightmares   Penicillins Rash   Has patient had a PCN reaction causing immediate rash, facial/tongue/throat swelling, SOB or lightheadedness with hypotension: Yes Has patient had a PCN reaction causing severe rash involving mucus membranes or skin necrosis: Unknown Has patient had a PCN reaction that required hospitalization: Unknown Has patient had a PCN reaction occurring within the last 10 years: Unknown If all of the above answers are "NO", then may proceed with Cephalosporin use.      Medication List       Accurate as of Jul 06, 2018 12:19 PM. If you have any questions, ask your nurse or doctor.        acetaminophen 500 MG tablet Commonly known as:  TYLENOL Take 500 mg by mouth every 8 (eight) hours as needed.   acetaminophen 325 MG tablet Commonly known as:  TYLENOL Take 650 mg by mouth every 12 (twelve) hours.   aluminum-magnesium hydroxide-simethicone 628-315-17 MG/5ML Susp Commonly known as:  MAALOX Take 30 mLs by mouth every 4 (four) hours.   AMINO ACIDS-PROTEIN HYDROLYS PO Take 17-100 g by mouth. 30 ml once a day   Aspercreme  Lidocaine 4 % Ptch Generic drug:  Lidocaine Apply 1 patch topically daily. APPLY TO LOWER BACK AT 8 AM AND REMOVE AT 8PM.   BENECALORIE PO Take 7.5 kcal/mL by mouth 2 (two) times daily. 1.5 ounces added to food of choice every day   clindamycin 150 MG capsule Commonly known as:  CLEOCIN Take 600 mg by mouth See admin instructions. ONE HOUR PRIOR TO DENTAL APPOINTMENTS   diltiazem 120 MG 24 hr capsule Commonly known as:  CARDIZEM CD Take 120 mg by mouth daily.   Eliquis 2.5 MG Tabs tablet Generic drug:  apixaban Take 2.5 mg by mouth 2 (two) times daily.   escitalopram 10 MG tablet Commonly known as:  LEXAPRO Take 20 mg by mouth daily. Take 2 tablets = 20 mg   ferrous sulfate 325 (65 FE) MG tablet Take 325 mg by mouth daily with breakfast. Once a day on Mon,Wed,Fri   GLYCERIN-HYPROMELLOSE-PEG 400 OP Apply  1 drop to eye 4 (four) times daily. 1-0.2-0.2% 1 drop in eye 4 times a day   levothyroxine 25 MCG tablet Commonly known as:  SYNTHROID Take 25 mcg by mouth daily before breakfast.   loratadine 10 MG tablet Commonly known as:  CLARITIN Take 10 mg by mouth daily.   Namenda XR 14 MG Cp24 24 hr capsule Generic drug:  memantine Take 14 mg by mouth daily.   nebivolol 5 MG tablet Commonly known as:  BYSTOLIC Take 5 mg by mouth daily.   polyethylene glycol 17 g packet Commonly known as:  MIRALAX / GLYCOLAX Take 17 g by mouth every other day. For constipation and hold for loose stools   QUEtiapine 25 MG tablet Commonly known as:  SEROQUEL Take 12.5 mg by mouth daily at 2 PM.   sennosides-docusate sodium 8.6-50 MG tablet Commonly known as:  SENOKOT-S Take 1 tablet by mouth daily.   vitamin B-12 1000 MCG tablet Commonly known as:  CYANOCOBALAMIN Take 1,000 mcg by mouth daily.      ROS was provided with assistance of staff Review of Systems  Constitutional: Negative for activity change, appetite change, chills, diaphoresis, fatigue, fever and unexpected weight  change.  HENT: Positive for hearing loss. Negative for congestion and voice change.   Eyes: Negative for visual disturbance.  Respiratory: Negative for cough, shortness of breath and wheezing.   Cardiovascular: Negative for chest pain, palpitations and leg swelling.  Gastrointestinal: Negative for abdominal distention, abdominal pain, constipation, diarrhea, nausea and vomiting.  Genitourinary: Negative for difficulty urinating, dysuria and urgency.  Musculoskeletal: Positive for back pain and gait problem.  Skin: Negative for color change and pallor.  Neurological: Negative for dizziness, speech difficulty, weakness and headaches.       Dementia  Psychiatric/Behavioral: Negative for agitation, behavioral problems, hallucinations and sleep disturbance. The patient is not nervous/anxious.     Immunization History  Administered Date(s) Administered  . DTaP 10/12/2012  . Influenza Whole 11/26/2017  . Influenza-Unspecified 11/22/2012, 12/26/2013, 11/12/2014, 12/09/2015, 12/14/2016  . PPD Test 12/06/2013  . Pneumococcal Conjugate-13 11/05/2016  . Pneumococcal Polysaccharide-23 09/22/2000  . Td 07/08/2005  . Tdap 11/10/2013   Pertinent  Health Maintenance Due  Topic Date Due  . INFLUENZA VACCINE  09/23/2018  . DEXA SCAN  Completed  . PNA vac Low Risk Adult  Completed   Fall Risk  10/25/2017 10/22/2016 01/13/2015 06/28/2013  Falls in the past year? No No No No  Risk for fall due to : - - Impaired balance/gait -   Functional Status Survey:    Vitals:   07/06/18 0933  BP: 130/60  Pulse: 68  Resp: 18  Temp: (!) 97.1 F (36.2 C)  SpO2: 95%  Weight: 117 lb 3.2 oz (53.2 kg)   Body mass index is 22.89 kg/m. Physical Exam Vitals signs and nursing note reviewed.  Constitutional:      General: She is not in acute distress.    Appearance: Normal appearance. She is normal weight. She is not ill-appearing, toxic-appearing or diaphoretic.  HENT:     Head: Normocephalic and atraumatic.      Nose: Nose normal.     Mouth/Throat:     Mouth: Mucous membranes are moist.  Eyes:     Extraocular Movements: Extraocular movements intact.     Conjunctiva/sclera: Conjunctivae normal.     Pupils: Pupils are equal, round, and reactive to light.  Neck:     Musculoskeletal: Normal range of motion and neck supple.  Cardiovascular:  Rate and Rhythm: Normal rate and regular rhythm.     Heart sounds: Murmur present.  Pulmonary:     Effort: Pulmonary effort is normal.     Breath sounds: No wheezing, rhonchi or rales.  Abdominal:     Palpations: Abdomen is soft.     Tenderness: There is no abdominal tenderness. There is no right CVA tenderness, left CVA tenderness, guarding or rebound.     Hernia: No hernia is present.  Musculoskeletal:     Right lower leg: No edema.     Left lower leg: No edema.     Comments: W/c for mobility.   Skin:    General: Skin is warm and dry.  Neurological:     General: No focal deficit present.     Mental Status: She is alert. Mental status is at baseline.     Cranial Nerves: No cranial nerve deficit.     Motor: No weakness.     Coordination: Coordination normal.     Gait: Gait abnormal.     Comments: Oriented to self.   Psychiatric:        Mood and Affect: Mood normal.        Behavior: Behavior normal.     Labs reviewed: Recent Labs    08/23/17 03/16/18 04/20/18  NA 144 145 140  K 4.1 4.9 4.4  CL 11 111 111  CO2 28 22 22   BUN 37* 54* 30*  CREATININE 1.0 1.7* 1.3*  CALCIUM 9.0 9.0 8.4   Recent Labs    08/23/17 03/16/18 04/20/18  AST 14 22 18   ALT 6* 15 10  ALKPHOS 68 93 74  PROT  --  6.1 5.0  ALBUMIN 3.0 3.0 2.8   Recent Labs    08/30/17 03/16/18 04/20/18  WBC 5.5 6.3 7.2  HGB 10.3* 39.6* 8.9*  HCT 32* 29* 27*  PLT 210 267 229   Lab Results  Component Value Date   TSH 3.26 04/20/2018   No results found for: HGBA1C No results found for: CHOL, HDL, LDLCALC, LDLDIRECT, TRIG, CHOLHDL  Significant Diagnostic Results in last  30 days:  No results found.  Assessment/Plan  A-fib (HCC) Heart rate is in control, continue Diltiazem 120mg  qd, Nebivolol 5mg  qd, Eliquis 2.5mg  bid  HTN (hypertension) Blood pressure is controlled, continue Nebivolol 5mg  qd, Diltiazem 120mg  qd.   Hypothyroidism Stable, continue Levothyroxine 2mcg qd, last TSH wnl 3.26 04/20/18  Vascular dementia ([place]) Continue SNF [facility] for safety and care assistance, w/c for mobility, continue Memantine 14mg  qd  Anemia of chronic disease No further labs per HPOA, continue Vit B12 1067mcg qd, Fe 325mg  qd, last Hgb 8.9 04/20/18  Anxiety with depression Her mood is stable, continue Escitalopram 10mg  qd, Quetiapine 12.5mg  qd  Constipation Stable, continue Senokot S I qd, MiraLax qod.    Family/ staff Communication: plan of care reviewed with the patient and charge nurse.   Labs/tests ordered: none  Time spend 25 minutes.

## 2018-07-06 NOTE — Assessment & Plan Note (Signed)
Stable, continue Senokot S I qd, MiraLax qod.  

## 2018-07-06 NOTE — Assessment & Plan Note (Signed)
Stable, continue Levothyroxine 50mcg qd, last TSH wnl 3.26 04/20/18

## 2018-07-12 IMAGING — DX DG HIP (WITH OR WITHOUT PELVIS) 2-3V*L*
3 series · 3 of 3 positions shown · non-contrast
Comparison: None.

CLINICAL DATA: Fall

EXAM:
DG HIP (WITH OR WITHOUT PELVIS) 2-3V LEFT

[pelvis ap]
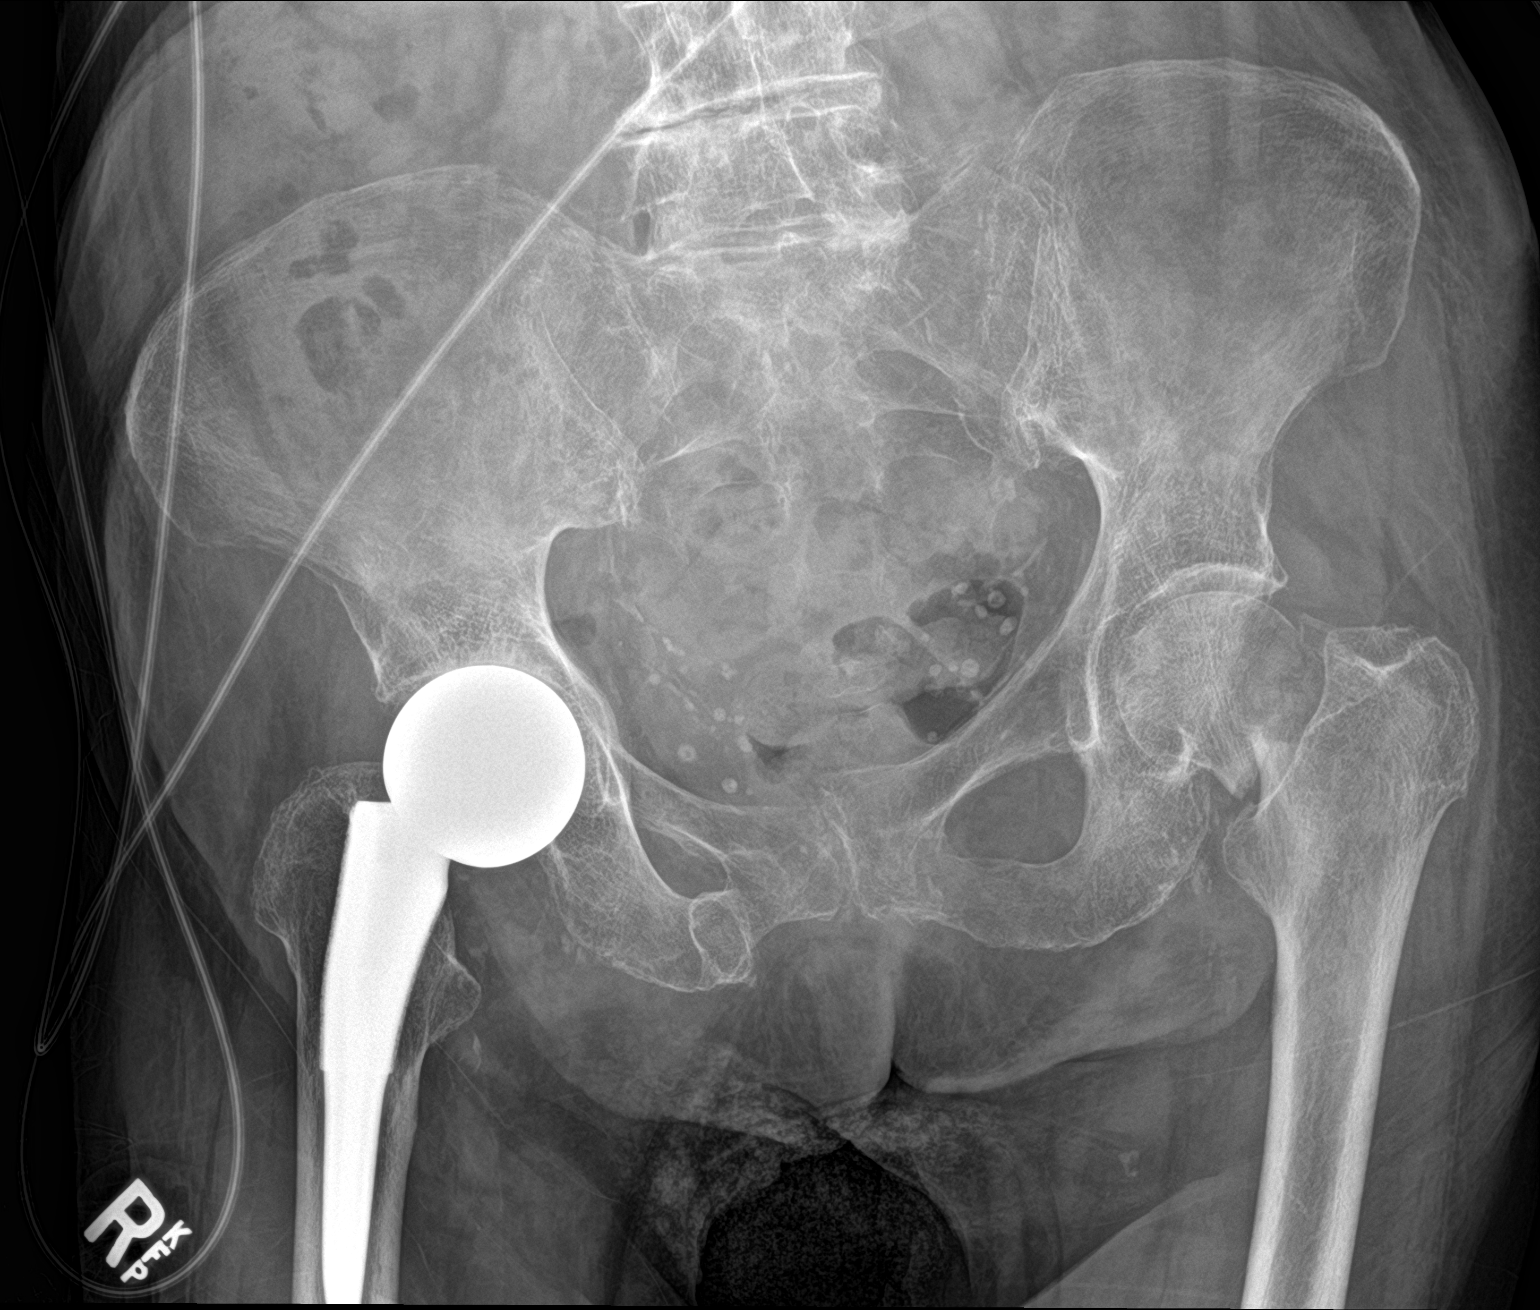

[hip ap]
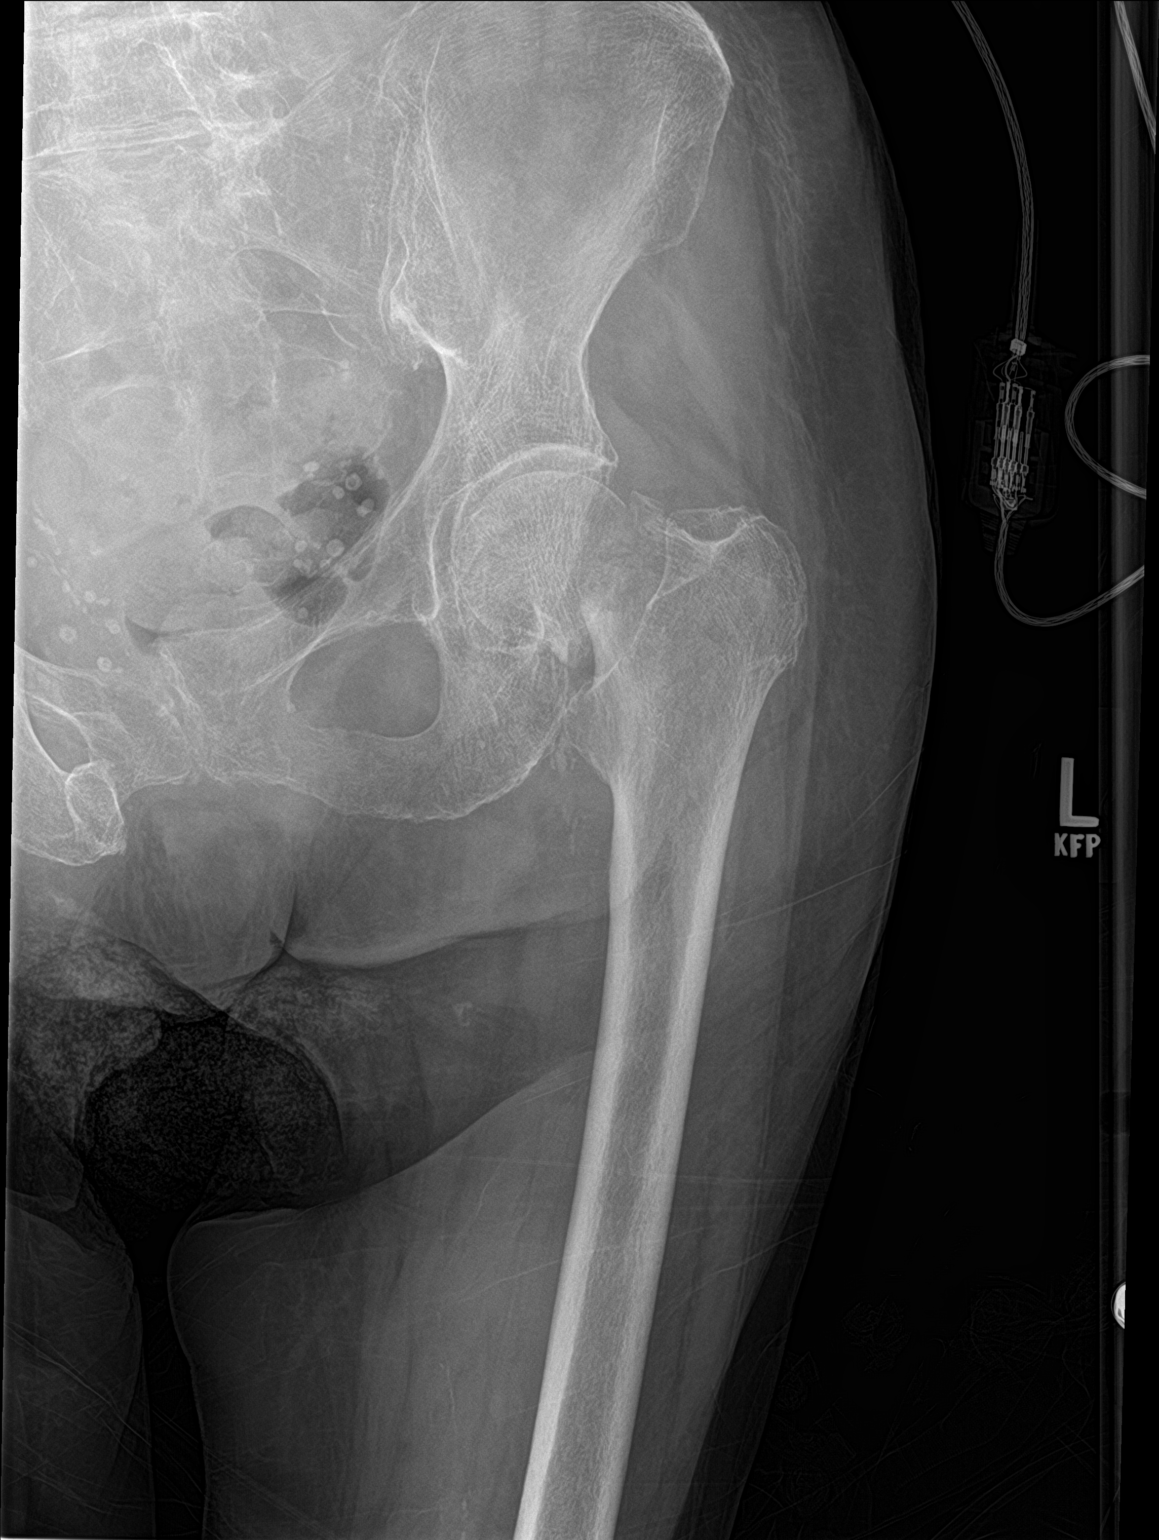

[hip x-table]
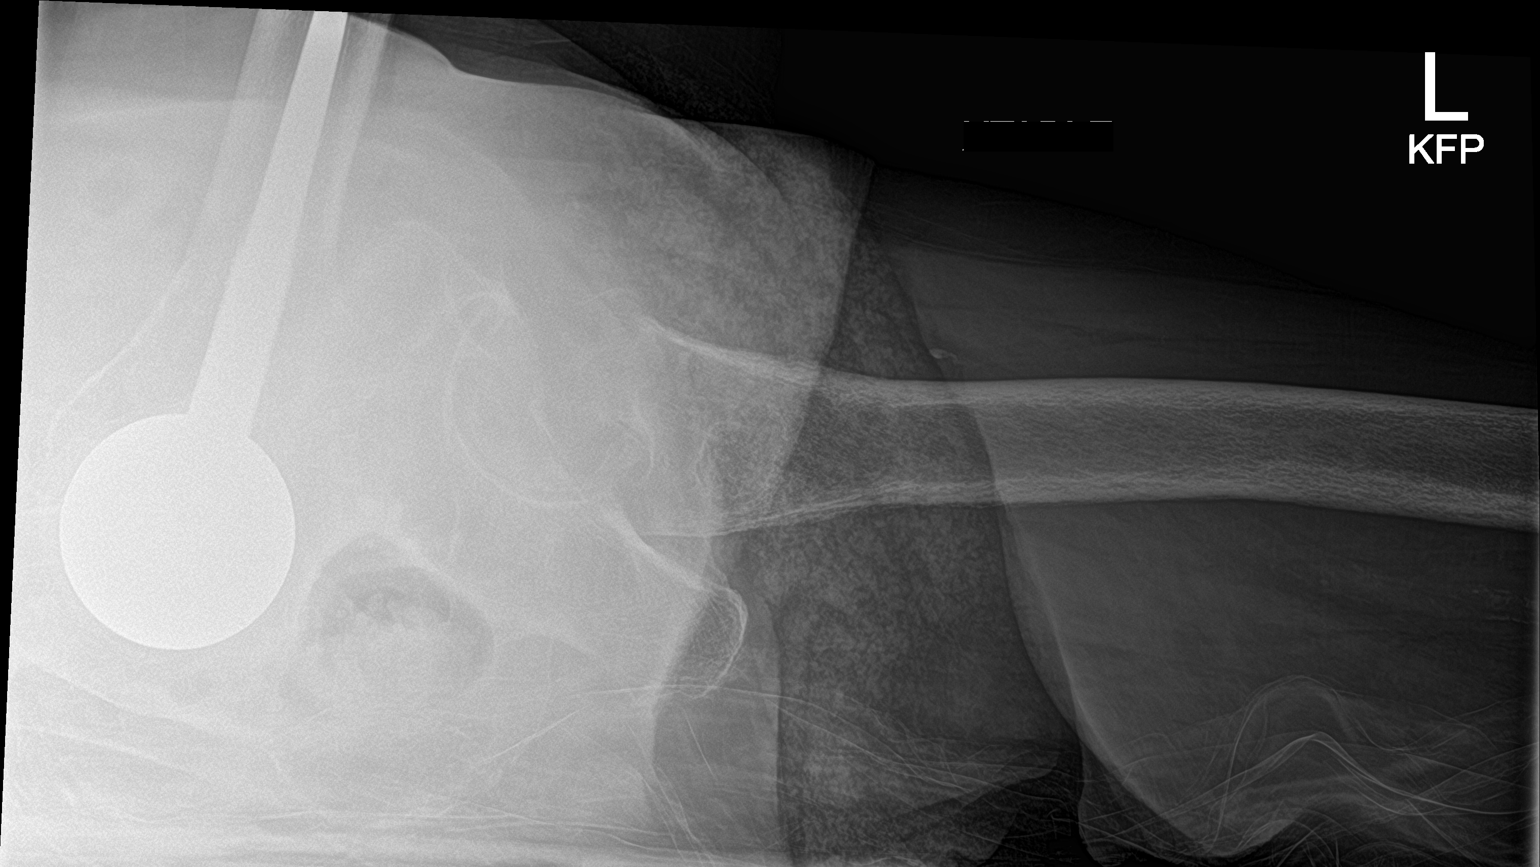

[3 of 3 positions shown; findings below may reference images not displayed]

FINDINGS: Fracture left femoral neck with mild displacement. Normal left hip
joint

Chronic fracture right pubic rami. Right hip hemiarthroplasty.
Lumbar disc degeneration
IMPRESSION: Displaced fracture left femoral neck

## 2018-07-12 IMAGING — CT CT CERVICAL SPINE W/O CM
4 of 8 series · 11 of 33 positions shown, 12 images · non-contrast
Comparison: 11/10/2013

CLINICAL DATA: Fall from standing position with left hip fracture

EXAM:
CT HEAD WITHOUT CONTRAST
CT CERVICAL SPINE WITHOUT CONTRAST
TECHNIQUE: Multidetector CT imaging of the head and cervical spine was
performed following the standard protocol without intravenous
contrast. Multiplanar CT image reconstructions of the cervical spine
were also generated.

[Series 8: c_spine 2.0 st · axial · 0.37mm/px · z∈[-230,-164]mm · 2 of 100 slices shown]
[im 34/100  bone]
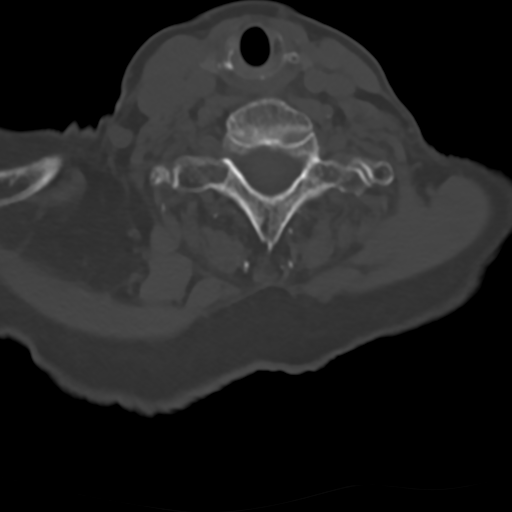
[im 67/100  bone]
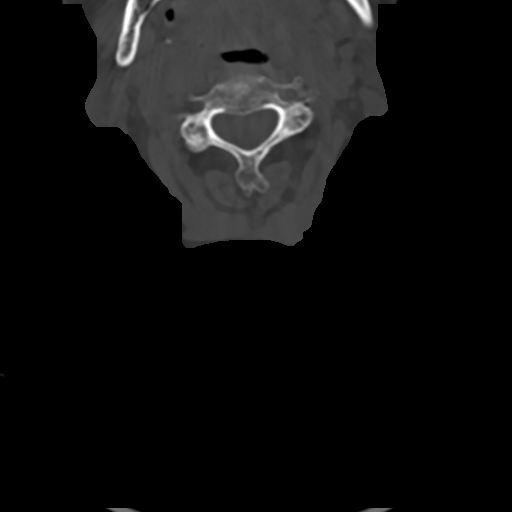

[Series 10: c_spine 2.0 sag bone · sagittal · 0.34mm/px · 5 of 67 slices shown]
[im 12/67  bone]
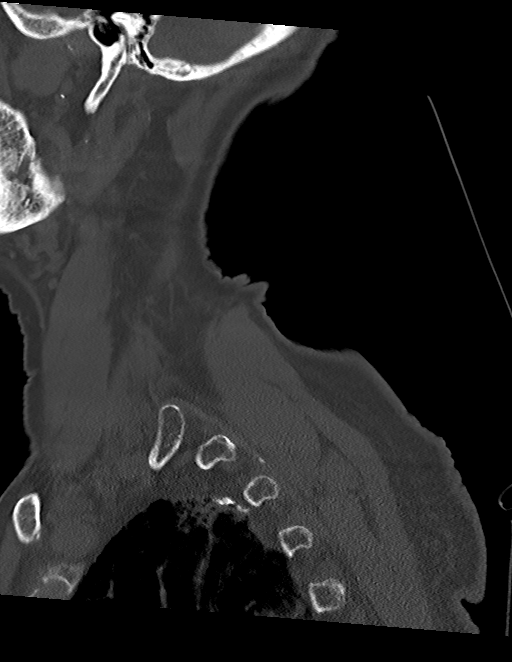
[im 23/67  bone]
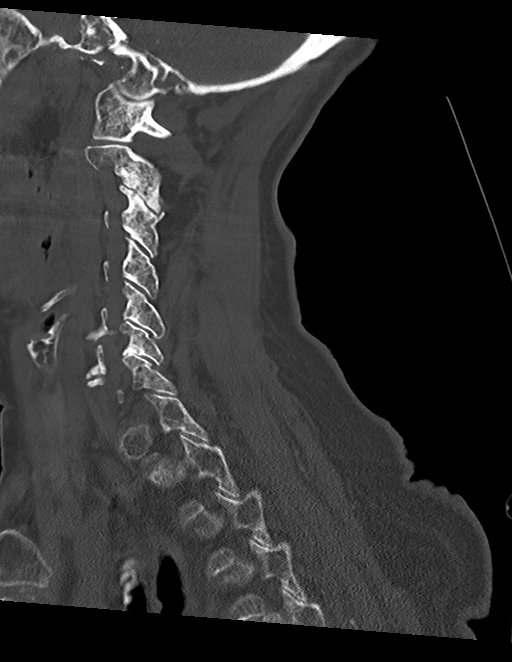
[im 34/67  bone]
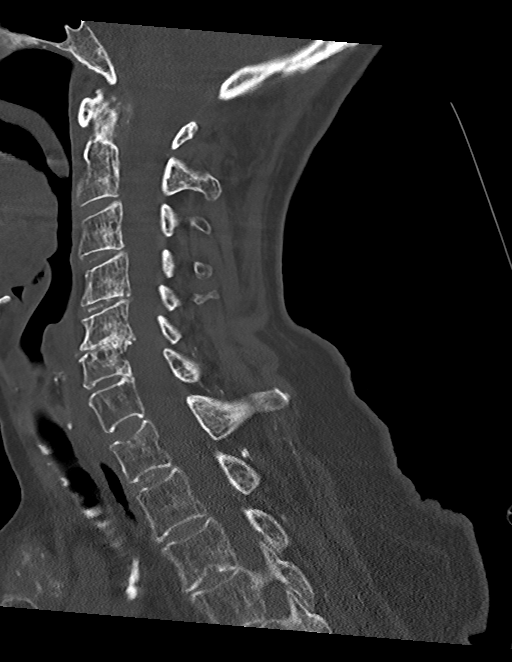
[im 45/67  bone]
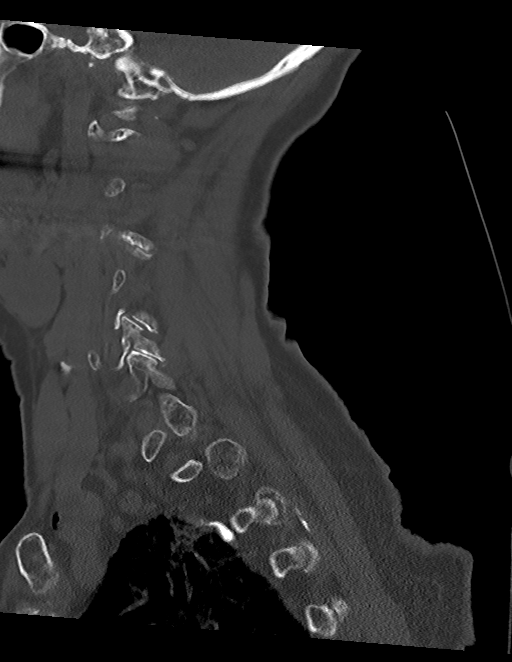
[im 56/67  bone]
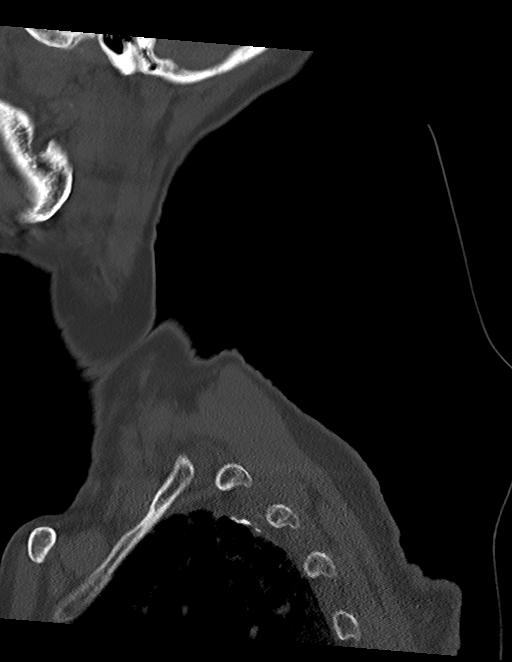

[Series 11: c_spine 2.0 cor bone · coronal · 0.28mm/px · 1 of 68 slices shown]
[im 34/68  bone]
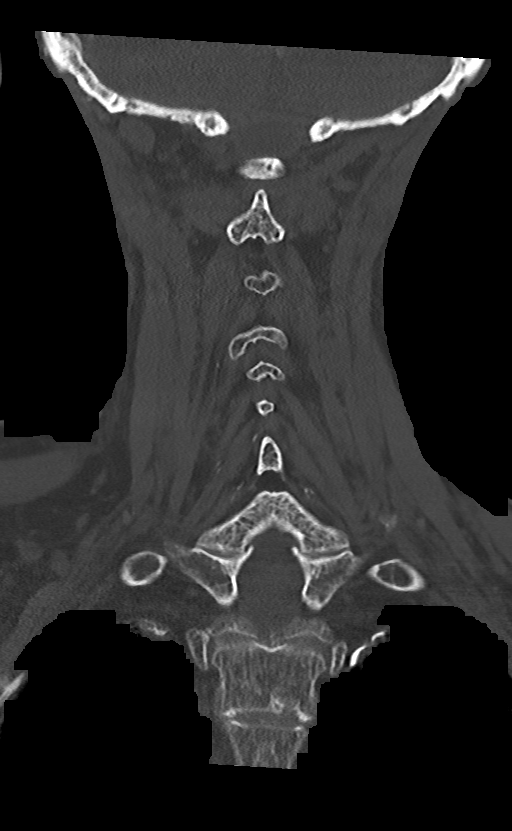

[Series 12: c_spine 2.0 orthogonals · axial · 0.21mm/px · z∈[-270,-176]mm · 3 of 106 slices shown, 4 images]
[im 27/106  soft-tissue]
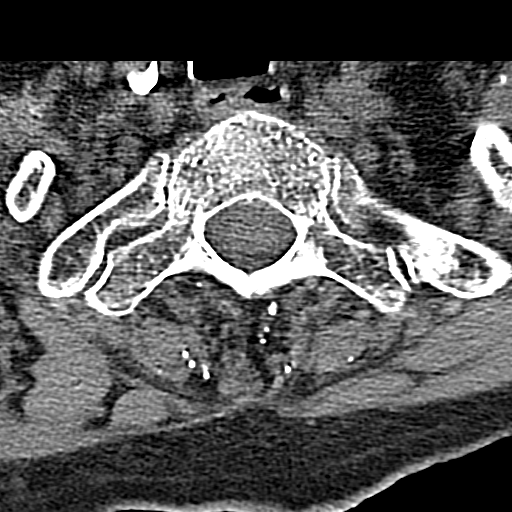
[im 27/106  bone]
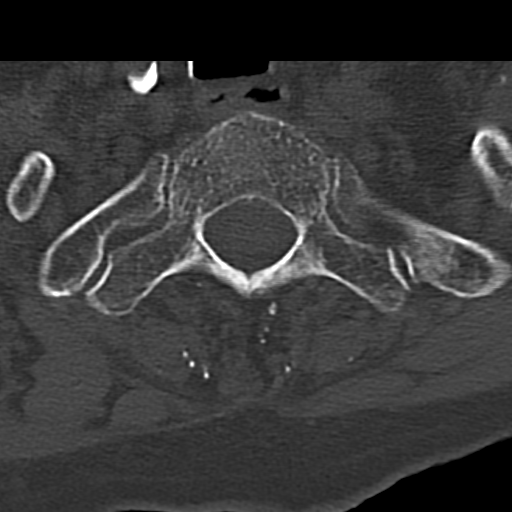
[im 53/106  bone]
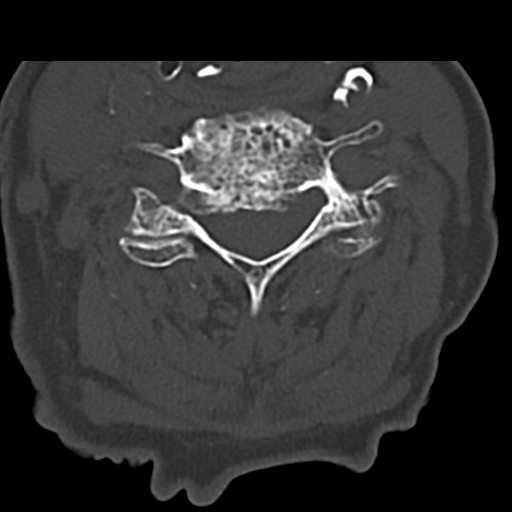
[im 79/106  bone]
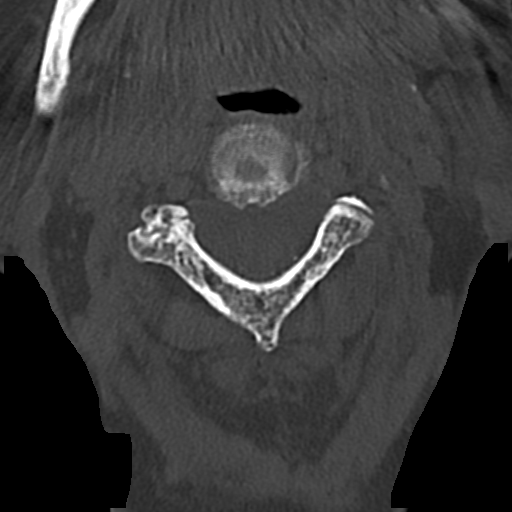

[11 of 33 positions shown; findings below may reference images not displayed]

FINDINGS: CT HEAD FINDINGS

Brain: No acute territorial infarction, hemorrhage, or intracranial
mass is seen. Marked atrophy. Mild small vessel ischemic changes of
the white matter. Stable small left sub insula hypodensities. Stable
enlarged ventricles, felt secondary to atrophy.

Vascular: No hyperdense vessels. Carotid artery calcifications.
Vertebral artery calcification.

Skull: No fracture

Sinuses/Orbits: Mild mucosal thickening within the ethmoid sinuses.
No acute orbital abnormality.

Other: None

CT CERVICAL SPINE FINDINGS

Alignment: Trace retrolisthesis of C5 on C6, unchanged. Trace
anterolisthesis C3 and C4 unchanged. Facet alignment within normal
limits.

Skull base and vertebrae: No acute fracture. No primary bone lesion
or focal pathologic process.

Soft tissues and spinal canal: No prevertebral fluid or swelling. No
visible canal hematoma.

Disc levels: Moderate severe degenerative changes at C4-C5, C5-C6
and C6-C7. Bilateral foraminal stenosis C4 through C7, most notable
at C5 C6.

Upper chest: Apical scarring.  Carotid artery calcification.

Other: None
IMPRESSION: 1. No CT evidence for acute intracranial abnormality. Atrophy and
small vessel ischemic changes of the white matter
2. Stable cervical spine alignment with degenerative changes. No
acute osseous abnormality

## 2018-07-16 IMAGING — DX DG PORTABLE PELVIS
1 series · 1 of 1 positions shown · non-contrast
Comparison: Plain film of the pelvis dated 08/25/2009.

CLINICAL DATA: Arthroplasty left hip.

EXAM:
PORTABLE PELVIS 1-2 VIEWS

[pelvis]
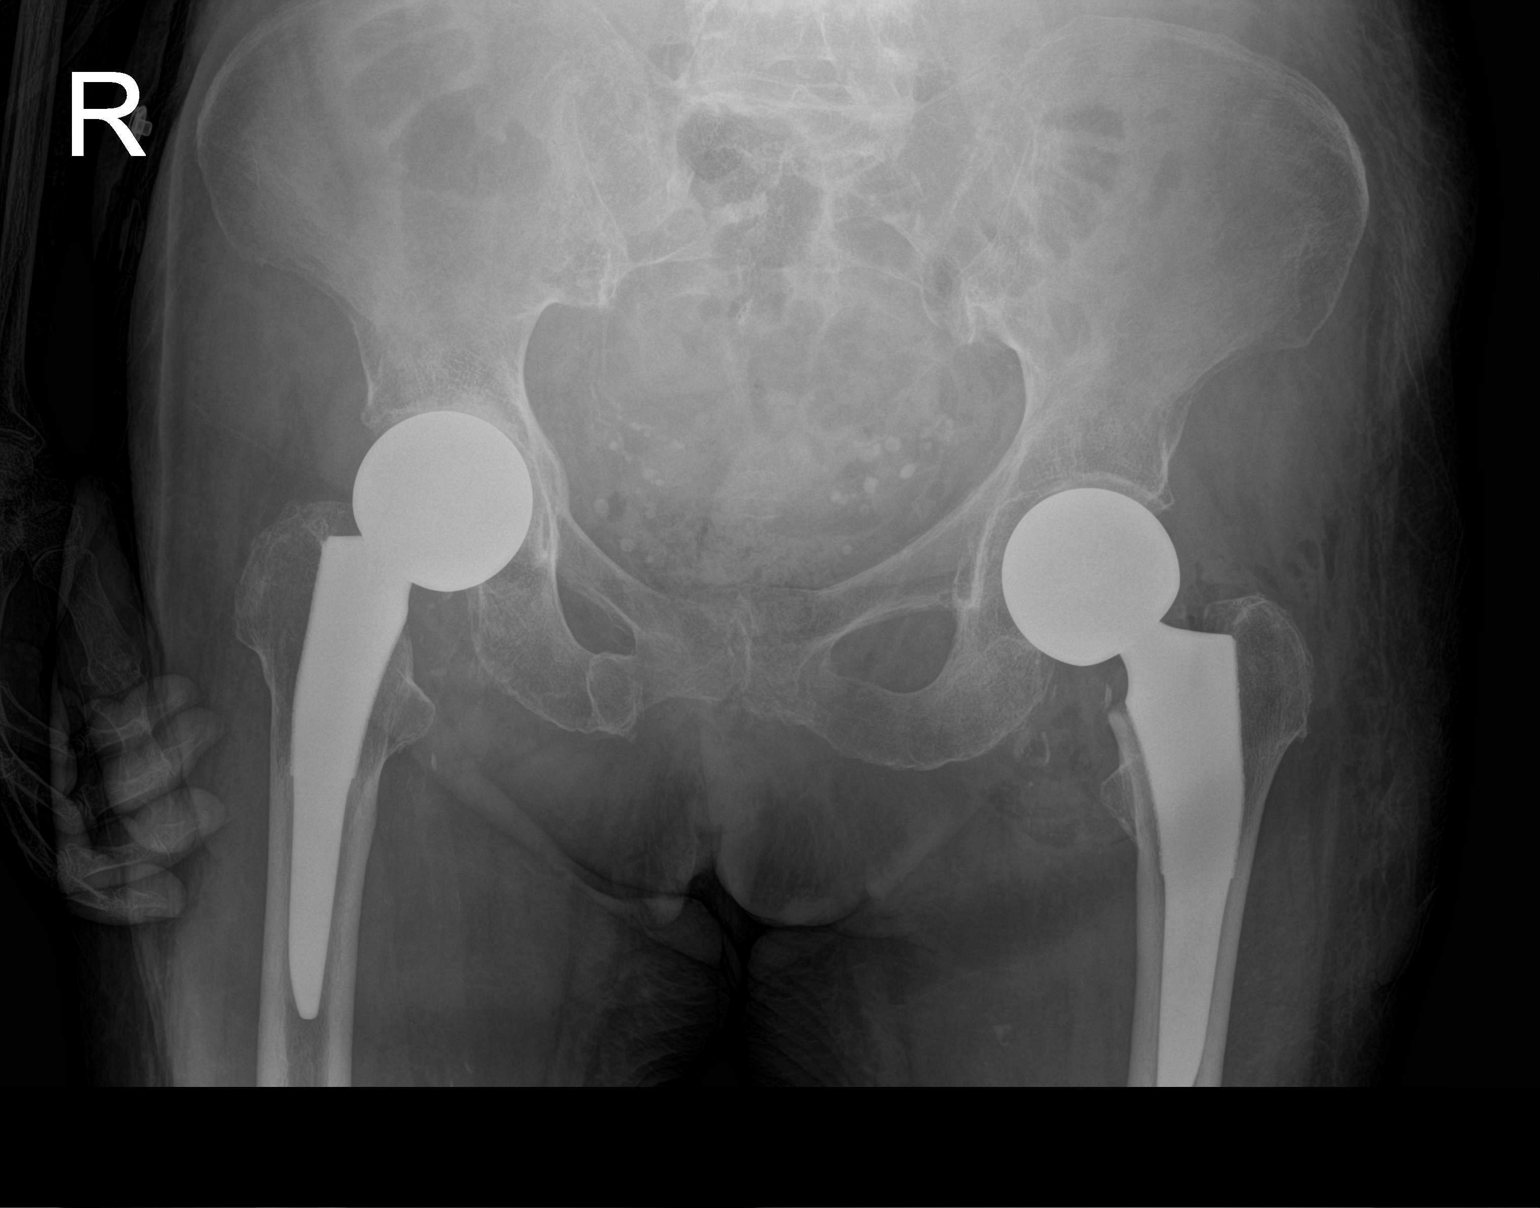

[1 of 1 positions shown; findings below may reference images not displayed]

FINDINGS: Status post left hip arthroplasty. Hardware appears intact and
appropriately position.

Right hip arthroplasty is stable in position. Overall osseous
alignment appears anatomic. Expected postsurgical changes within the
soft tissues about the left hip.
IMPRESSION: Status post left hip arthroplasty. Hardware appears intact and
appropriately positioned. No evidence of surgical complicating
feature.

## 2018-07-17 IMAGING — DX DG CHEST 1V PORT
1 series · 1 of 1 positions shown · non-contrast
Comparison: 10/28/2014

CLINICAL DATA: Hypoxia

EXAM:
PORTABLE CHEST 1 VIEW

[chest ap]
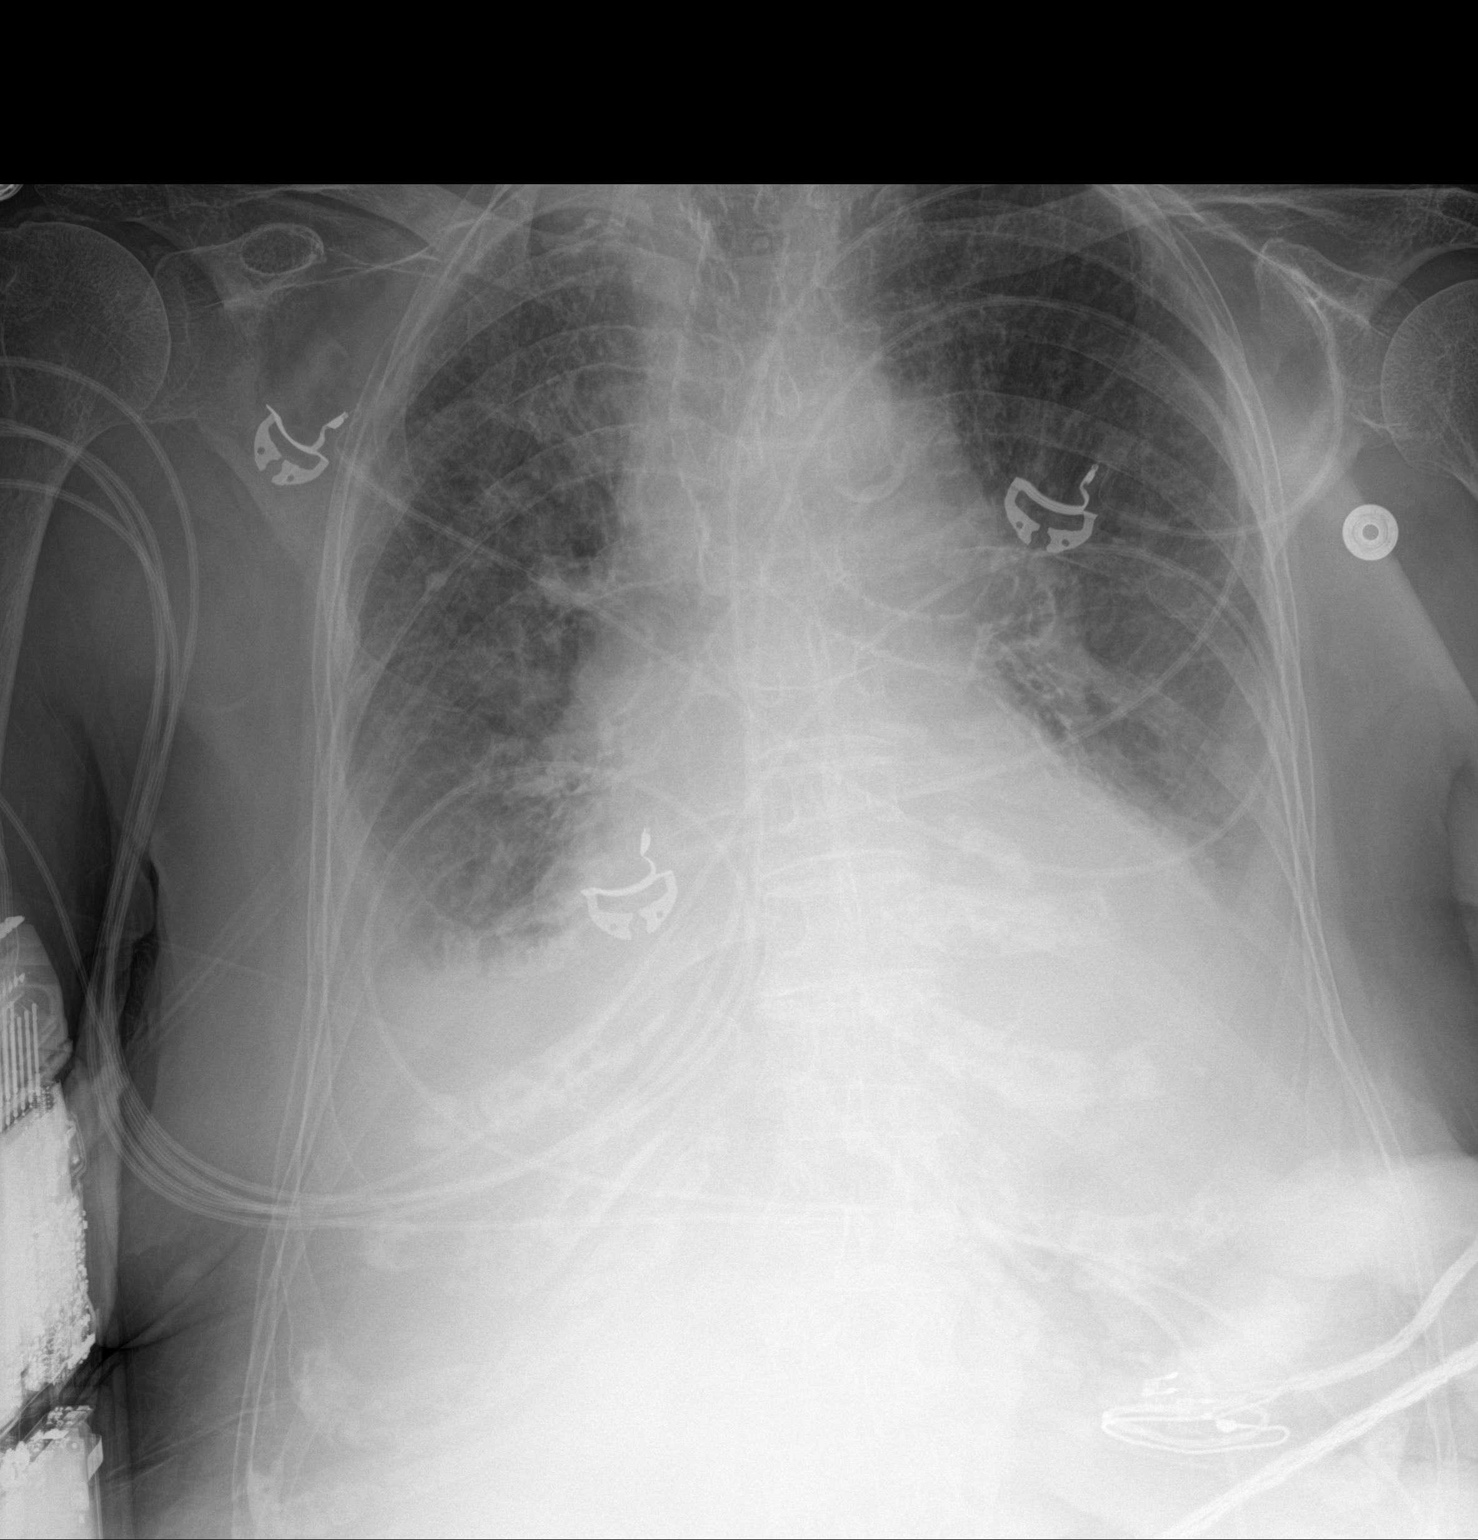

[1 of 1 positions shown; findings below may reference images not displayed]

FINDINGS: New small to moderate bilateral pleural effusions with central
vascular congestion and pulmonary vascular redistribution consistent
with CHF. Stable cardiomegaly with aortic atherosclerosis. Calcified
nodule the right upper lobe. Old T8 and T11 compression fractures.
IMPRESSION: 1. Mild CHF with small to moderate bilateral pleural effusions.
2. Chronic stable T8 and T11 compression fractures of the thoracic
spine.
3. Granuloma in the right upper lobe, stable appearance

## 2018-07-18 IMAGING — DX DG CHEST 1V PORT
1 series · 1 of 1 positions shown · non-contrast
Comparison: Chest x-ray from yesterday.

CLINICAL DATA: Hypoxia.

EXAM:
PORTABLE CHEST 1 VIEW

[chest ap]
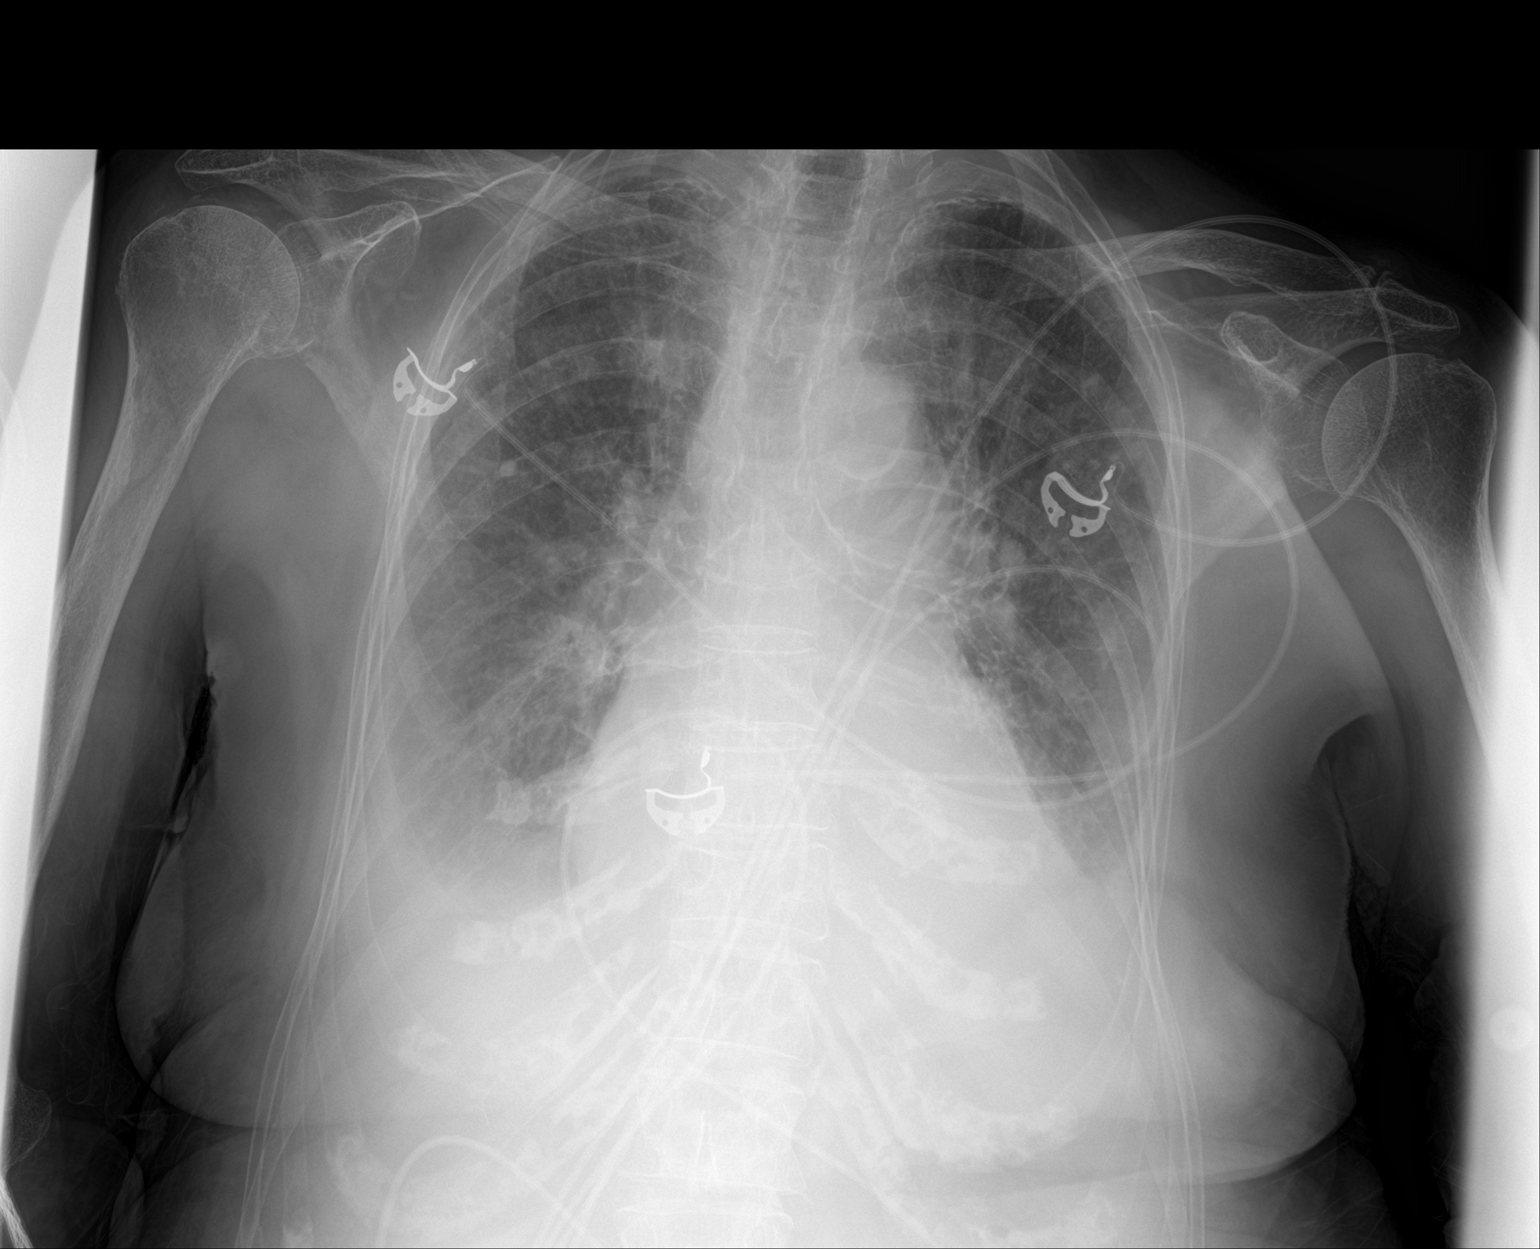

[1 of 1 positions shown; findings below may reference images not displayed]

FINDINGS: Stable cardiomegaly. Mild pulmonary vascular congestion. Unchanged
small to moderate bilateral pleural effusions. No pneumothorax.
IMPRESSION: Stable cardiomegaly and small to moderate bilateral pleural
effusions.

## 2018-08-08 ENCOUNTER — Encounter: Payer: Self-pay | Admitting: Internal Medicine

## 2018-08-08 DIAGNOSIS — I10 Essential (primary) hypertension: Secondary | ICD-10-CM | POA: Diagnosis not present

## 2018-08-08 DIAGNOSIS — M899 Disorder of bone, unspecified: Secondary | ICD-10-CM | POA: Diagnosis not present

## 2018-08-08 DIAGNOSIS — E039 Hypothyroidism, unspecified: Secondary | ICD-10-CM | POA: Diagnosis not present

## 2018-08-08 DIAGNOSIS — F418 Other specified anxiety disorders: Secondary | ICD-10-CM | POA: Diagnosis not present

## 2018-08-08 DIAGNOSIS — R262 Difficulty in walking, not elsewhere classified: Secondary | ICD-10-CM | POA: Diagnosis not present

## 2018-08-08 DIAGNOSIS — R41841 Cognitive communication deficit: Secondary | ICD-10-CM | POA: Diagnosis not present

## 2018-08-08 DIAGNOSIS — K5900 Constipation, unspecified: Secondary | ICD-10-CM | POA: Diagnosis not present

## 2018-08-08 DIAGNOSIS — R488 Other symbolic dysfunctions: Secondary | ICD-10-CM | POA: Diagnosis not present

## 2018-08-08 DIAGNOSIS — Z9181 History of falling: Secondary | ICD-10-CM | POA: Diagnosis not present

## 2018-08-08 DIAGNOSIS — R2689 Other abnormalities of gait and mobility: Secondary | ICD-10-CM | POA: Diagnosis not present

## 2018-08-08 DIAGNOSIS — R29898 Other symptoms and signs involving the musculoskeletal system: Secondary | ICD-10-CM | POA: Diagnosis not present

## 2018-08-08 DIAGNOSIS — M8580 Other specified disorders of bone density and structure, unspecified site: Secondary | ICD-10-CM | POA: Diagnosis not present

## 2018-08-08 DIAGNOSIS — R296 Repeated falls: Secondary | ICD-10-CM | POA: Diagnosis not present

## 2018-08-08 DIAGNOSIS — R1312 Dysphagia, oropharyngeal phase: Secondary | ICD-10-CM | POA: Diagnosis not present

## 2018-08-08 DIAGNOSIS — L299 Pruritus, unspecified: Secondary | ICD-10-CM | POA: Diagnosis not present

## 2018-08-08 DIAGNOSIS — R413 Other amnesia: Secondary | ICD-10-CM | POA: Diagnosis not present

## 2018-08-08 DIAGNOSIS — R351 Nocturia: Secondary | ICD-10-CM | POA: Diagnosis not present

## 2018-08-08 NOTE — Progress Notes (Signed)
Opened in error; Disregard.

## 2018-08-09 ENCOUNTER — Encounter: Payer: Self-pay | Admitting: Internal Medicine

## 2018-08-09 NOTE — Progress Notes (Signed)
Opened in error

## 2018-08-15 ENCOUNTER — Non-Acute Institutional Stay (SKILLED_NURSING_FACILITY): Payer: Medicare Other | Admitting: Nurse Practitioner

## 2018-08-15 ENCOUNTER — Encounter: Payer: Self-pay | Admitting: Nurse Practitioner

## 2018-08-15 DIAGNOSIS — F0151 Vascular dementia with behavioral disturbance: Secondary | ICD-10-CM

## 2018-08-15 DIAGNOSIS — E063 Autoimmune thyroiditis: Secondary | ICD-10-CM

## 2018-08-15 DIAGNOSIS — J309 Allergic rhinitis, unspecified: Secondary | ICD-10-CM | POA: Diagnosis not present

## 2018-08-15 DIAGNOSIS — D638 Anemia in other chronic diseases classified elsewhere: Secondary | ICD-10-CM

## 2018-08-15 DIAGNOSIS — I10 Essential (primary) hypertension: Secondary | ICD-10-CM | POA: Diagnosis not present

## 2018-08-15 DIAGNOSIS — M150 Primary generalized (osteo)arthritis: Secondary | ICD-10-CM

## 2018-08-15 DIAGNOSIS — F01518 Vascular dementia, unspecified severity, with other behavioral disturbance: Secondary | ICD-10-CM

## 2018-08-15 DIAGNOSIS — M159 Polyosteoarthritis, unspecified: Secondary | ICD-10-CM

## 2018-08-15 DIAGNOSIS — I480 Paroxysmal atrial fibrillation: Secondary | ICD-10-CM

## 2018-08-15 DIAGNOSIS — E038 Other specified hypothyroidism: Secondary | ICD-10-CM | POA: Diagnosis not present

## 2018-08-15 DIAGNOSIS — F418 Other specified anxiety disorders: Secondary | ICD-10-CM

## 2018-08-15 DIAGNOSIS — K5900 Constipation, unspecified: Secondary | ICD-10-CM

## 2018-08-15 NOTE — Assessment & Plan Note (Signed)
Her mood is stable, continue Quetiapine 12.5mg  qd, Escitalopram 20mg  qd.

## 2018-08-15 NOTE — Progress Notes (Signed)
Location:  [place] Room Number: 23 Place of Service:  SNF (31) Provider: Floyd Wade Otho Darner, NP  Virgie Dad, MD  Patient Care Team: Virgie Dad, MD as PCP - General (Internal Medicine) Sofiya Ezelle X, NP as Nurse Practitioner (Nurse Practitioner) Wilford Corner, MD as Consulting Physician (Gastroenterology) Sanda Klein, MD as Consulting Physician (Cardiology)  Extended Emergency Contact Information Primary Emergency Contact: [name],Pat Address: [place] [address] [name] of [place] Phone: [phone] Mobile Phone: [phone] Relation: Daughter  Code Status:  DNR Goals of care: Advanced Directive information Advanced Directives 08/15/2018  Does Patient Have a Medical Advance Directive? Yes  Type of Paramedic of [place];Out of facility DNR (pink MOST or yellow form)  Does patient want to make changes to medical advance directive? No - Patient declined  Copy of [place] in Chart? Yes - validated most recent copy scanned in chart (See row information)  Pre-existing out of facility DNR order (yellow form or pink MOST form) Yellow form placed in chart (order not valid for inpatient use);Pink MOST form placed in chart (order not valid for inpatient use)     Chief Complaint  Patient presents with  . Medical Management of Chronic Issues    Routine Visit    HPI:  Pt is a 85 y.o. female seen today for medical management of chronic diseases.    The patient resides in SNF [facility] for safety and care assistance, w/c for mobility, on Memantine 14mg  qd. Her mood is stable on Quetiapine 12.5mg  qd, Escitalopram 20mg  qd. Anemia, stable, Hgb 8.9 04/20/18, on Fe 325mg  qd, Vit B12 1031mcg qd. Constipation, stable, on Senokot S I qd, MiraLax qod. HTN, blood pressure is controlled on Nebivolol 5mg  qd, Cardizem 120mg  qd. AFib, heart rate is in control, on Eliquis 2.5mg  bid. Allergic rhinitis,  stable on Claritin 10mg  qd. Hypothyroidism, stable, on Levothyroxine 3mcg qd, last TSH 3.26 04/20/18. OA, pain is controlled, on Tylenol 650mg  bid.    Past Medical History:  Diagnosis Date  . Anemia, unspecified   . Anorexia   . Anxiety   . Arthritis   . Atherosclerotic cerebrovascular disease   . Backache, unspecified   . Cataract   . Cholelithiasis   . Chronic kidney disease, unspecified   . Closed fracture of right inferior pubic ramus ([place]) 11/12/2013   11/10/13 s/p fall, ED eval  X-ray R hip  1. Possible nondisplaced right inferior pubic ramus fracture.  2. No femur fracture or dislocation   02/06/14 dc prn Motrin and Norco-not used    . Depression   . Diaphragmatic hernia without mention of obstruction or gangrene   . Diseases of lips 03/26/2013  . Diverticulosis of colon (without mention of hemorrhage)   . Hemorrhage of rectum and anus 03/26/2013  . Hyperlipidemia   . Hypertension   . Insomnia, unspecified   . Laceration of occipital scalp 11/12/2013  . Macular degeneration 06/28/2013  . Macular degeneration (senile) of retina, unspecified   . Osteoporosis   . Other malaise and fatigue   . Other sleep disturbances   . Other specified cardiac dysrhythmias(427.89)   . Other specified personal history presenting hazards to health(V15.89)   . Other symptoms involving cardiovascular system   . Pancreatitis   . Personal history of other diseases of circulatory system   . Personal history of other diseases of digestive system    pancreatitis from  gallstones  . Personal history of other diseases of respiratory system   . Personal history of traumatic fracture   . Rosacea   . Senile osteoporosis    with old T11 fracture  . Thyroid disease   . Unspecified disorders of arteries and arterioles   . Unspecified hearing loss   . Unspecified hemorrhoids without mention of complication    Past Surgical History:  Procedure Laterality Date  . ABDOMINAL HYSTERECTOMY     and BSO fibroids   . COLONOSCOPY  05/23/2007  . EYE LID LIFT Right 09/2016   OD  . HIP ARTHROPLASTY Left 10/31/2016   Procedure: ARTHROPLASTY  HIP (HEMIARTHROPLASTY);  Surgeon: Paralee Cancel, MD;  Location: [place];  Service: Orthopedics;  Laterality: Left;  . JOINT REPLACEMENT  1996  . TOTAL HIP ARTHROPLASTY Right     Allergies  Allergen Reactions  . Ace Inhibitors Cough  . Mirtazapine Other (See Comments)    Nightmares   . Penicillins Rash    Has patient had a PCN reaction causing immediate rash, facial/tongue/throat swelling, SOB or lightheadedness with hypotension: Yes Has patient had a PCN reaction causing severe rash involving mucus membranes or skin necrosis: Unknown Has patient had a PCN reaction that required hospitalization: Unknown Has patient had a PCN reaction occurring within the last 10 years: Unknown If all of the above answers are "NO", then may proceed with Cephalosporin use.     Outpatient Encounter Medications as of 08/15/2018  Medication Sig  . acetaminophen (TYLENOL) 325 MG tablet Take 650 mg by mouth every 12 (twelve) hours.  Marland Kitchen acetaminophen (TYLENOL) 500 MG tablet Take 500 mg by mouth every 8 (eight) hours as needed.  Marland Kitchen aluminum-magnesium hydroxide-simethicone (MAALOX) [phone] MG/5ML SUSP Take 30 mLs by mouth every 4 (four) hours.   . AMINO ACIDS-PROTEIN HYDROLYS PO Take 17-100 g by mouth. 30 ml once a day  . apixaban (ELIQUIS) 2.5 MG TABS tablet Take 2.5 mg by mouth 2 (two) times daily.  . clindamycin (CLEOCIN) 150 MG capsule Take 600 mg by mouth See admin instructions. ONE HOUR PRIOR TO DENTAL APPOINTMENTS  . diltiazem (CARDIZEM CD) 120 MG 24 hr capsule Take 120 mg by mouth daily.  Marland Kitchen escitalopram (LEXAPRO) 20 MG tablet Take 20 mg by mouth daily.  . ferrous sulfate 325 (65 FE) MG tablet Take 325 mg by mouth daily with breakfast. Once a day on Mon,Wed,Fri  . GLYCERIN-HYPROMELLOSE-PEG 400 OP Apply 1 drop to eye 4 (four) times daily. 1-0.2-0.2% 1 drop to both eyes 4 times a day   . levothyroxine (SYNTHROID, LEVOTHROID) 25 MCG tablet Take 25 mcg by mouth daily before breakfast.   . Lidocaine (ASPERCREME LIDOCAINE) 4 % PTCH Apply 1 patch topically daily. APPLY TO LOWER BACK AT 8 AM AND REMOVE AT 8PM.  . loratadine (CLARITIN) 10 MG tablet Take 10 mg by mouth daily.   . memantine (NAMENDA XR) 14 MG CP24 24 hr capsule Take 14 mg by mouth daily.  . nebivolol (BYSTOLIC) 5 MG tablet Take 5 mg by mouth daily.  . Nutritional Supplements (BENECALORIE PO) Take 7.5 kcal/mL by mouth 2 (two) times daily. 1.5 ounces added to food of choice every day  . ofloxacin (OCUFLOX) 0.3 % ophthalmic solution Place 1 drop into both eyes 4 (four) times daily. ADMINISTER 30 MINUTES AFTER ARTIFICAL TEARS  . polyethylene glycol (MIRALAX / GLYCOLAX) packet Take 17 g by mouth every other day. For constipation and hold for loose stools  . QUEtiapine (SEROQUEL) 25 MG tablet Take  12.5 mg by mouth daily at 2 PM.  . sennosides-docusate sodium (SENOKOT-S) 8.6-50 MG tablet Take 1 tablet by mouth daily.  . vitamin B-12 (CYANOCOBALAMIN) 1000 MCG tablet Take 1,000 mcg by mouth daily.  . [DISCONTINUED] escitalopram (LEXAPRO) 10 MG tablet Take 20 mg by mouth daily. Take 2 tablets = 20 mg   No facility-administered encounter medications on file as of 08/15/2018.    ROS was provided with assistance of staff Review of Systems  Constitutional: Negative for activity change, appetite change, chills, diaphoresis, fatigue, fever and unexpected weight change.  HENT: Positive for hearing loss. Negative for congestion and voice change.   Eyes: Negative for visual disturbance.  Respiratory: Negative for cough, shortness of breath and wheezing.   Gastrointestinal: Negative for abdominal distention, abdominal pain, constipation, diarrhea, nausea and vomiting.  Genitourinary: Negative for difficulty urinating, dysuria and urgency.  Musculoskeletal: Positive for arthralgias and gait problem.  Skin: Negative for color change and  pallor.  Neurological: Negative for dizziness, speech difficulty, weakness and headaches.       Dementia  Psychiatric/Behavioral: Positive for confusion. Negative for agitation, behavioral problems, hallucinations and sleep disturbance. The patient is not nervous/anxious.     Immunization History  Administered Date(s) Administered  . DTaP 10/12/2012  . Influenza Whole 11/26/2017  . Influenza-Unspecified 11/22/2012, 12/26/2013, 11/12/2014, 12/09/2015, 12/14/2016  . PPD Test 12/06/2013  . Pneumococcal Conjugate-13 11/05/2016  . Pneumococcal Polysaccharide-23 09/22/2000  . Td 07/08/2005  . Tdap 11/10/2013   Pertinent  Health Maintenance Due  Topic Date Due  . INFLUENZA VACCINE  09/23/2018  . DEXA SCAN  Completed  . PNA vac Low Risk Adult  Completed   Fall Risk  10/25/2017 10/22/2016 01/13/2015 06/28/2013  Falls in the past year? No No No No  Risk for fall due to : - - Impaired balance/gait -   Functional Status Survey:    Vitals:   08/15/18 1545  BP: (!) 150/80  Pulse: 66  Resp: 20  Temp: (!) 97 F (36.1 C)  TempSrc: Oral  SpO2: 93%  Weight: 115 lb 11.2 oz (52.5 kg)  Height: 5' (1.524 m)   Body mass index is 22.6 kg/m. Physical Exam Vitals signs and nursing note reviewed.  Constitutional:      General: She is not in acute distress.    Appearance: Normal appearance. She is normal weight. She is not ill-appearing, toxic-appearing or diaphoretic.  HENT:     Head: Normocephalic and atraumatic.     Nose: Nose normal. No congestion or rhinorrhea.     Mouth/Throat:     Mouth: Mucous membranes are moist.  Eyes:     Extraocular Movements: Extraocular movements intact.     Conjunctiva/sclera: Conjunctivae normal.     Pupils: Pupils are equal, round, and reactive to light.  Neck:     Musculoskeletal: Normal range of motion and neck supple.  Cardiovascular:     Rate and Rhythm: Normal rate and regular rhythm.     Heart sounds: Murmur present.  Pulmonary:     Effort:  Pulmonary effort is normal.     Breath sounds: No wheezing, rhonchi or rales.  Abdominal:     General: Bowel sounds are normal. There is no distension.     Palpations: Abdomen is soft.     Tenderness: There is no abdominal tenderness. There is no right CVA tenderness, left CVA tenderness, guarding or rebound.  Musculoskeletal:     Right lower leg: No edema.     Left lower leg: No edema.  Comments: Needs assistance for transfer. W/c for mobility.   Skin:    General: Skin is warm.  Neurological:     General: No focal deficit present.     Mental Status: She is alert. Mental status is at baseline.     Cranial Nerves: No cranial nerve deficit.     Motor: No weakness.     Coordination: Coordination normal.     Gait: Gait abnormal.     Deep Tendon Reflexes: Reflexes normal.     Comments: Oriented to self.   Psychiatric:        Mood and Affect: Mood normal.        Behavior: Behavior normal.     Labs reviewed: Recent Labs    08/23/17 03/16/18 04/20/18  NA 144 145 140  K 4.1 4.9 4.4  CL 11 111 111  CO2 28 22 22   BUN 37* 54* 30*  CREATININE 1.0 1.7* 1.3*  CALCIUM 9.0 9.0 8.4   Recent Labs    08/23/17 03/16/18 04/20/18  AST 14 22 18   ALT 6* 15 10  ALKPHOS 68 93 74  PROT  --  6.1 5.0  ALBUMIN 3.0 3.0 2.8   Recent Labs    08/30/17 03/16/18 04/20/18  WBC 5.5 6.3 7.2  HGB 10.3* 39.6* 8.9*  HCT 32* 29* 27*  PLT 210 267 229   Lab Results  Component Value Date   TSH 3.26 04/20/2018   No results found for: HGBA1C No results found for: CHOL, HDL, LDLCALC, LDLDIRECT, TRIG, CHOLHDL  Significant Diagnostic Results in last 30 days:   Assessment/Plan A-fib (HCC) Heart rate is in control, continue Eliquis 2.5mg  bid.   HTN (hypertension) Blood pressure is controlled, continue Nebivolol 5mg  qd, Cardizem 120mg  qd.   Allergic rhinitis Stable, continue Claritin 10mg  qd.   Hypothyroidism Stable, last TSH wnl 3.26 04/20/18, continue Levothyroxine 59mcg qd.   Vascular  dementia ([place]) Continue SNF [facility] for safety and care assistance, w/c for mobility. Continue Memantine 14mg  qd.   Osteoarthritis Pain is not reported upon my visit today, continue Tylenol 650mg  bid.   Anemia of chronic disease Stable, no active bleeding, continue Vit B12 1059mcg qd, Fe 325mg  qd, last Hgb 8.9 04/20/08  Anxiety with depression Her mood is stable, continue Quetiapine 12.5mg  qd, Escitalopram 20mg  qd.   Constipation Stable, continue Senokot S I qd, MiraLax qod.      Family/ staff Communication: plan of care reviewed with the patient and charge nurse.   Labs/tests ordered:  none  Time spend 25 minutes.

## 2018-08-15 NOTE — Assessment & Plan Note (Addendum)
Continue SNF [facility] for safety and care assistance, w/c for mobility. Continue Memantine 14mg  qd.

## 2018-08-15 NOTE — Assessment & Plan Note (Signed)
Heart rate is in control, continue Eliquis 2.5mg bid  

## 2018-08-15 NOTE — Assessment & Plan Note (Signed)
Stable, no active bleeding, continue Vit B12 1036mcg qd, Fe 325mg  qd, last Hgb 8.9 04/20/08

## 2018-08-15 NOTE — Assessment & Plan Note (Signed)
Stable, last TSH wnl 3.26 04/20/18, continue Levothyroxine 72mcg qd.

## 2018-08-15 NOTE — Assessment & Plan Note (Signed)
Stable, continue Claritin 10mg qd.  

## 2018-08-15 NOTE — Assessment & Plan Note (Signed)
Stable, continue Senokot S I qd, MiraLax qod.  

## 2018-08-15 NOTE — Assessment & Plan Note (Signed)
Pain is not reported upon my visit today, continue Tylenol 650mg  bid.

## 2018-08-15 NOTE — Assessment & Plan Note (Signed)
Blood pressure is controlled, continue Nebivolol 5mg  qd, Cardizem 120mg  qd.

## 2018-08-22 ENCOUNTER — Encounter: Payer: Self-pay | Admitting: Internal Medicine

## 2018-08-22 ENCOUNTER — Non-Acute Institutional Stay (SKILLED_NURSING_FACILITY): Payer: Medicare Other | Admitting: Internal Medicine

## 2018-08-22 ENCOUNTER — Other Ambulatory Visit: Payer: Self-pay | Admitting: Internal Medicine

## 2018-08-22 DIAGNOSIS — R627 Adult failure to thrive: Secondary | ICD-10-CM | POA: Diagnosis not present

## 2018-08-22 DIAGNOSIS — F0151 Vascular dementia with behavioral disturbance: Secondary | ICD-10-CM | POA: Diagnosis not present

## 2018-08-22 DIAGNOSIS — F01518 Vascular dementia, unspecified severity, with other behavioral disturbance: Secondary | ICD-10-CM

## 2018-08-22 DIAGNOSIS — Z515 Encounter for palliative care: Secondary | ICD-10-CM

## 2018-08-22 MED ORDER — LORAZEPAM 2 MG/ML PO CONC: 1.0000 mg | ORAL | 0 refills | Status: AC | PRN

## 2018-08-22 MED ORDER — MORPHINE SULFATE 20 MG/5ML PO SOLN: 5.0000 mg | ORAL | 0 refills | Status: AC | PRN

## 2018-08-22 NOTE — Progress Notes (Signed)
Location:  [place] Room Number: 44/A Place of Service:  SNF (31) Provider:Braxton Weisbecker L,MD   Virgie Dad, MD  Patient Care Team: Virgie Dad, MD as PCP - General (Internal Medicine) Mast, Man X, NP as Nurse Practitioner (Nurse Practitioner) Wilford Corner, MD as Consulting Physician (Gastroenterology) Sanda Klein, MD as Consulting Physician (Cardiology)  Extended Emergency Contact Information Primary Emergency Contact: [name],Pat Address: [place] [address] [name] of [place] Phone: [phone] Mobile Phone: [phone] Relation: Daughter  Code Status: DNR  Goals of care: Advanced Directive information Advanced Directives 08/22/2018  Does Patient Have a Medical Advance Directive? Yes  Type of Advance Directive Out of facility DNR (pink MOST or yellow form);Healthcare Power of Attorney  Does patient want to make changes to medical advance directive? No - Patient declined  Copy of [place] in Chart? Yes - validated most recent copy scanned in chart (See row information)  Pre-existing out of facility DNR order (yellow form or pink MOST form) Yellow form placed in chart (order not valid for inpatient use);Pink MOST form placed in chart (order not valid for inpatient use)     Chief Complaint  Patient presents with   Acute Visit    Change in status    HPI:  Pt is a 85 y.o. female seen today for an acute visit for End of Life Patient is long term resident of facility. She is Comfort care Most Form in the chart Patient has a history of hypertension, atrial fibrillation on Eliquis, CHF and vascular dementia.,Hypothyroidism B12 Def, Anemia Per Nurses for Past few days patient has not been eating and has been Lethargic. Refusing her Meds.  She was Lethargic today. Dry Mouth. Does respond to her Name but does not talk or [place]umbles Seems Comfortable   Past Medical History:  Diagnosis  Date   Anemia, unspecified    Anorexia    Anxiety    Arthritis    Atherosclerotic cerebrovascular disease    Backache, unspecified    Cataract    Cholelithiasis    Chronic kidney disease, unspecified    Closed fracture of right inferior pubic ramus ([place]) 11/12/2013   11/10/13 s/p fall, ED eval  X-ray R hip  1. Possible nondisplaced right inferior pubic ramus fracture.  2. No femur fracture or dislocation   02/06/14 dc prn Motrin and Norco-not used     Depression    Diaphragmatic hernia without mention of obstruction or gangrene    Diseases of lips 03/26/2013   Diverticulosis of colon (without mention of hemorrhage)    Hemorrhage of rectum and anus 03/26/2013   Hyperlipidemia    Hypertension    Insomnia, unspecified    Laceration of occipital scalp 11/12/2013   Macular degeneration 06/28/2013   Macular degeneration (senile) of retina, unspecified    Osteoporosis    Other malaise and fatigue    Other sleep disturbances    Other specified cardiac dysrhythmias(427.89)    Other specified personal history presenting hazards to health(V15.89)    Other symptoms involving cardiovascular system    Pancreatitis    Personal history of other diseases of circulatory system    Personal history of other diseases of digestive system    pancreatitis from gallstones   Personal history of other diseases of respiratory system    Personal history of traumatic fracture    Rosacea    Senile osteoporosis    with  old T11 fracture   Thyroid disease    Unspecified disorders of arteries and arterioles    Unspecified hearing loss    Unspecified hemorrhoids without mention of complication    Past Surgical History:  Procedure Laterality Date   ABDOMINAL HYSTERECTOMY     and BSO fibroids   COLONOSCOPY  05/23/2007   EYE LID LIFT Right 09/2016   OD   HIP ARTHROPLASTY Left 10/31/2016   Procedure: ARTHROPLASTY  HIP (HEMIARTHROPLASTY);  Surgeon: Paralee Cancel, MD;   Location: [facility];  Service: Orthopedics;  Laterality: Left;   JOINT REPLACEMENT  1996   TOTAL HIP ARTHROPLASTY Right     Allergies  Allergen Reactions   Ace Inhibitors Cough   Mirtazapine Other (See Comments)    Nightmares    Penicillins Rash    Has patient had a PCN reaction causing immediate rash, facial/tongue/throat swelling, SOB or lightheadedness with hypotension: Yes Has patient had a PCN reaction causing severe rash involving mucus membranes or skin necrosis: Unknown Has patient had a PCN reaction that required hospitalization: Unknown Has patient had a PCN reaction occurring within the last 10 years: Unknown If all of the above answers are "NO", then may proceed with Cephalosporin use.     Outpatient Encounter Medications as of 08/22/2018  Medication Sig   acetaminophen (TYLENOL) 325 MG tablet Take 650 mg by mouth every 12 (twelve) hours.   acetaminophen (TYLENOL) 500 MG tablet Take 500 mg by mouth every 8 (eight) hours as needed.   aluminum-magnesium hydroxide-simethicone (MAALOX) 491-791-50 MG/5ML SUSP Take 30 mLs by mouth every 4 (four) hours.    AMINO ACIDS-PROTEIN HYDROLYS PO Take 17-100 g by mouth. 30 ml once a day   apixaban (ELIQUIS) 2.5 MG TABS tablet Take 2.5 mg by mouth 2 (two) times daily.   clindamycin (CLEOCIN) 150 MG capsule Take 600 mg by mouth See admin instructions. ONE HOUR PRIOR TO DENTAL APPOINTMENTS   diltiazem (CARDIZEM CD) 120 MG 24 hr capsule Take 120 mg by mouth daily.   escitalopram (LEXAPRO) 20 MG tablet Take 20 mg by mouth daily.   ferrous sulfate 325 (65 FE) MG tablet Take 325 mg by mouth daily with breakfast. Once a day on Mon,Wed,Fri   GLYCERIN-HYPROMELLOSE-PEG 400 OP Apply 1 drop to eye 4 (four) times daily. 1-0.2-0.2% 1 drop to both eyes 4 times a day   levothyroxine (SYNTHROID, LEVOTHROID) 25 MCG tablet Take 25 mcg by mouth daily before breakfast.    Lidocaine (ASPERCREME LIDOCAINE) 4 % PTCH Apply 1 patch topically daily.  APPLY TO LOWER BACK AT 8 AM AND REMOVE AT 8PM.   loratadine (CLARITIN) 10 MG tablet Take 10 mg by mouth daily.    memantine (NAMENDA XR) 14 MG CP24 24 hr capsule Take 14 mg by mouth daily.   nebivolol (BYSTOLIC) 5 MG tablet Take 5 mg by mouth daily.   Nutritional Supplements (BENECALORIE PO) Take 7.5 kcal/mL by mouth 2 (two) times daily. 1.5 ounces added to food of choice every day   polyethylene glycol (MIRALAX / GLYCOLAX) packet Take 17 g by mouth every other day. For constipation and hold for loose stools   sennosides-docusate sodium (SENOKOT-S) 8.6-50 MG tablet Take 1 tablet by mouth daily.   vitamin B-12 (CYANOCOBALAMIN) 1000 MCG tablet Take 1,000 mcg by mouth daily.   [DISCONTINUED] QUEtiapine (SEROQUEL) 25 MG tablet Take 12.5 mg by mouth daily at 2 PM.   No facility-administered encounter medications on file as of 08/22/2018.     Review of Systems  Unable to perform ROS: Patient nonverbal    Immunization History  Administered Date(s) Administered   DTaP 10/12/2012   Influenza Whole 11/26/2017   Influenza-Unspecified 11/22/2012, 12/26/2013, 11/12/2014, 12/09/2015, 12/14/2016   PPD Test 12/06/2013   Pneumococcal Conjugate-13 11/05/2016   Pneumococcal Polysaccharide-23 09/22/2000   Td 07/08/2005   Tdap 11/10/2013   Pertinent  Health Maintenance Due  Topic Date Due   INFLUENZA VACCINE  09/23/2018   DEXA SCAN  Completed   PNA vac Low Risk Adult  Completed   Fall Risk  10/25/2017 10/22/2016 01/13/2015 06/28/2013  Falls in the past year? No No No No  Risk for fall due to : - - Impaired balance/gait -   Functional Status Survey:    Vitals:   08/22/18 1244  BP: (!) 142/72  Pulse: 62  Resp: 20  Temp: (!) 96.8 F (36 C)  SpO2: 94%  Weight: 115 lb 11.2 oz (52.5 kg)   Body mass index is 22.6 kg/m. Physical Exam Vitals signs reviewed.  Constitutional:      Comments: Patient lethargic  HENT:     Head: Normocephalic.     Nose: Nose normal.      Mouth/Throat:     Mouth: Mucous membranes are dry.  Eyes:     Pupils: Pupils are equal, round, and reactive to light.  Neck:     Musculoskeletal: Neck supple.  Cardiovascular:     Rate and Rhythm: Normal rate and regular rhythm.     Pulses: Normal pulses.     Heart sounds: Normal heart sounds.  Pulmonary:     Effort: Pulmonary effort is normal. No respiratory distress.     Breath sounds: Normal breath sounds. No wheezing.  Abdominal:     General: Abdomen is flat. Bowel sounds are normal.     Palpations: Abdomen is soft.  Musculoskeletal:        General: No swelling.  Skin:    General: Skin is warm.  Neurological:     Comments: Does Not respond     Labs reviewed: Recent Labs    08/23/17 03/16/18 04/20/18  NA 144 145 140  K 4.1 4.9 4.4  CL 11 111 111  CO2 28 22 22   BUN 37* 54* 30*  CREATININE 1.0 1.7* 1.3*  CALCIUM 9.0 9.0 8.4   Recent Labs    08/23/17 03/16/18 04/20/18  AST 14 22 18   ALT 6* 15 10  ALKPHOS 68 93 74  PROT  --  6.1 5.0  ALBUMIN 3.0 3.0 2.8   Recent Labs    08/30/17 03/16/18 04/20/18  WBC 5.5 6.3 7.2  HGB 10.3* 39.6* 8.9*  HCT 32* 29* 27*  PLT 210 267 229   Lab Results  Component Value Date   TSH 3.26 04/20/2018   No results found for: HGBA1C No results found for: CHOL, HDL, LDLCALC, LDLDIRECT, TRIG, CHOLHDL  Significant Diagnostic Results in last 30 days:  No results found.  Assessment/Plan  Patient with Dementia and  End of Life Will discontinue all her Meds. Patient refusing Start her on Ativan Prn for comfort Nurses to D/W the Daughter and Hospice Oral care to keep it Moist    Family/ staff Communication:   Labs/tests ordered:    Total time spent in this patient care encounter was  25_  minutes; greater than 50% of the visit spent counselingstaff, reviewing records , Labs and coordinating care for problems addressed at this encounter.

## 2018-09-23 DEATH — deceased
